# Patient Record
Sex: Female | Born: 1990 | Race: White | Hispanic: No | Marital: Married | Smoking: Never smoker
Health system: Southern US, Community
[De-identification: ages and names within clinical notes are randomized; demographics above are authoritative.]

## PROBLEM LIST (undated history)

## (undated) DIAGNOSIS — K760 Fatty (change of) liver, not elsewhere classified: Secondary | ICD-10-CM

## (undated) DIAGNOSIS — J45909 Unspecified asthma, uncomplicated: Secondary | ICD-10-CM

## (undated) DIAGNOSIS — T7840XA Allergy, unspecified, initial encounter: Secondary | ICD-10-CM

## (undated) DIAGNOSIS — T783XXA Angioneurotic edema, initial encounter: Secondary | ICD-10-CM

## (undated) DIAGNOSIS — J069 Acute upper respiratory infection, unspecified: Secondary | ICD-10-CM

## (undated) DIAGNOSIS — K219 Gastro-esophageal reflux disease without esophagitis: Secondary | ICD-10-CM

## (undated) DIAGNOSIS — J309 Allergic rhinitis, unspecified: Secondary | ICD-10-CM

## (undated) DIAGNOSIS — N946 Dysmenorrhea, unspecified: Secondary | ICD-10-CM

## (undated) DIAGNOSIS — I10 Essential (primary) hypertension: Secondary | ICD-10-CM

## (undated) DIAGNOSIS — G4730 Sleep apnea, unspecified: Secondary | ICD-10-CM

## (undated) DIAGNOSIS — R000 Tachycardia, unspecified: Secondary | ICD-10-CM

## (undated) HISTORY — DX: Sleep apnea, unspecified: G47.30

## (undated) HISTORY — DX: Fatty (change of) liver, not elsewhere classified: K76.0

## (undated) HISTORY — DX: Angioneurotic edema, initial encounter: T78.3XXA

## (undated) HISTORY — DX: Acute upper respiratory infection, unspecified: J06.9

## (undated) HISTORY — DX: Unspecified asthma, uncomplicated: J45.909

## (undated) HISTORY — PX: NO PAST SURGERIES: SHX2092

## (undated) HISTORY — DX: Dysmenorrhea, unspecified: N94.6

## (undated) HISTORY — DX: Allergic rhinitis, unspecified: J30.9

## (undated) HISTORY — DX: Allergy, unspecified, initial encounter: T78.40XA

---

## 2014-11-02 DIAGNOSIS — E559 Vitamin D deficiency, unspecified: Secondary | ICD-10-CM | POA: Insufficient documentation

## 2014-11-02 DIAGNOSIS — D509 Iron deficiency anemia, unspecified: Secondary | ICD-10-CM | POA: Insufficient documentation

## 2014-11-02 DIAGNOSIS — J309 Allergic rhinitis, unspecified: Secondary | ICD-10-CM | POA: Insufficient documentation

## 2015-05-09 ENCOUNTER — Ambulatory Visit: Payer: Self-pay | Admitting: Allergy and Immunology

## 2015-05-09 ENCOUNTER — Ambulatory Visit (INDEPENDENT_AMBULATORY_CARE_PROVIDER_SITE_OTHER): Payer: Commercial Managed Care - PPO | Admitting: Allergy and Immunology

## 2015-05-09 ENCOUNTER — Encounter: Payer: Self-pay | Admitting: *Deleted

## 2015-05-09 ENCOUNTER — Encounter: Payer: Self-pay | Admitting: Allergy and Immunology

## 2015-05-09 VITALS — BP 144/90 | HR 100 | Temp 98.1°F | Resp 16 | Ht 68.4 in | Wt 244.5 lb

## 2015-05-09 DIAGNOSIS — J45901 Unspecified asthma with (acute) exacerbation: Secondary | ICD-10-CM | POA: Diagnosis not present

## 2015-05-09 DIAGNOSIS — J3089 Other allergic rhinitis: Secondary | ICD-10-CM | POA: Diagnosis not present

## 2015-05-09 DIAGNOSIS — I10 Essential (primary) hypertension: Secondary | ICD-10-CM

## 2015-05-09 DIAGNOSIS — J0100 Acute maxillary sinusitis, unspecified: Secondary | ICD-10-CM | POA: Diagnosis not present

## 2015-05-09 DIAGNOSIS — J4541 Moderate persistent asthma with (acute) exacerbation: Secondary | ICD-10-CM | POA: Insufficient documentation

## 2015-05-09 MED ORDER — PREDNISONE 1 MG PO TABS: 10.0000 mg | ORAL_TABLET | ORAL | Status: DC

## 2015-05-09 MED ORDER — AZELASTINE HCL 0.15 % NA SOLN: 2.0000 | Freq: Two times a day (BID) | NASAL | Status: DC

## 2015-05-09 MED ORDER — LEVALBUTEROL HCL 1.25 MG/3ML IN NEBU
1.2500 mg | INHALATION_SOLUTION | Freq: Once | RESPIRATORY_TRACT | Status: AC
  Administered 2015-05-09: 1.25 mg via RESPIRATORY_TRACT

## 2015-05-09 MED ORDER — IPRATROPIUM BROMIDE 0.02 % IN SOLN
0.5000 mg | Freq: Once | RESPIRATORY_TRACT | Status: AC
  Administered 2015-05-09: 0.5 mg via RESPIRATORY_TRACT

## 2015-05-09 NOTE — Assessment & Plan Note (Signed)
Continue current treatment plan. 

## 2015-05-09 NOTE — Assessment & Plan Note (Signed)
   Rishika has been asked to follow up with her primary care physician regarding this issue.  Patient has verbalized understanding and has agreed to do so.

## 2015-05-09 NOTE — Assessment & Plan Note (Addendum)
   Prednisone has been provided, 40 mg x3 days, 20 mg x1 day, 10 mg x1 day, then stop.  For now, and during all upper respiratory tract infections and asthma flares, increase Asmanex 220 g to 2 inhalations twice a day.  She may return to her previous dose of one inhalation daily after asthma exacerbation has resolved.  To maximize pulmonary deposition, a spacer has been provided along with instructions for its proper administration with an HFA inhaler.  Continue montelukast 10 mg daily at bedtime and Xopenex HFA every 4-6 hours as needed.   The patient has been asked to contact me if her symptoms persist, progress, or if she becomes febrile. Otherwise, she may return for follow up in 4 months.

## 2015-05-09 NOTE — Progress Notes (Signed)
History of present illness: HPI Comments: Nicole Mendoza is a 24 y.o. female with persistent asthma, allergic rhinitis, gastroesophageal reflux, and hypertension who presents today for sick visit.  She reports that 4 days ago she began to experience progressive sinus pressure, nasal congestion, thick postnasal drainage, sore throat, dyspnea on exertion, chest tightness, coughing, and wheezing.  She denies fevers or chills.  She states that her blood pressure has been well-controlled on metoprolol, however she did not take this medication until just prior to the office visit today.   Assessment and plan: Asthma with acute exacerbation  Prednisone has been provided, 40 mg x3 days, 20 mg x1 day, 10 mg x1 day, then stop.  For now, and during all upper respiratory tract infections and asthma flares, increase Asmanex 220 g to 2 inhalations twice a day.  She may return to her previous dose of one inhalation daily after asthma exacerbation has resolved.  To maximize pulmonary deposition, a spacer has been provided along with instructions for its proper administration with an HFA inhaler.  Continue montelukast 10 mg daily at bedtime and Xopenex HFA every 4-6 hours as needed.   The patient has been asked to contact me if her symptoms persist, progress, or if she becomes febrile. Otherwise, she may return for follow up in 4 months.  Acute sinusitis  Prednisone has been provided (as above).  A prescription has been provided for azelastine, 2 sprays per nostril twice a day.  Continue fluticasone nasal spray 2 sprays per nostril daily.  Guaifenesin 1200 mg twice daily as needed with adequate hydration as discussed.   Nasal saline lavage as needed has been recommended along with instructions for proper administration.   Hypertension  [name] has been asked to follow up with her primary care physician regarding this issue.  Patient has verbalized understanding and has agreed to do so.  Allergic  rhinitis  Continue current treatment plan.    Medications ordered this encounter: Meds ordered this encounter  Medications  . predniSONE (DELTASONE) tablet 10 mg    Sig:   . Azelastine HCl 0.15 % SOLN    Sig: Place 2 sprays into both nostrils 2 (two) times daily.    Dispense:  30 mL    Refill:  5    Diagnositics: Spirometry:  FVC of 4.13 L and FEV1 of 3.28 L with 240 mL postbronchodilator improvement.    Physical examination: Blood pressure 144/90, pulse 100, temperature 98.1 F (36.7 C), temperature source Oral, resp. rate 16, height 5' 8.4" (1.737 m), weight 244 lb 7.8 oz (110.9 kg).  General: Alert, interactive, in no acute distress. HEENT: TMs pearly gray, turbinates markedly edematous with thick discharge, post-pharynx erythematous. Neck: Supple without lymphadenopathy. Lungs: Mildly decreased breath sounds bilaterally without wheezing, rhonchi or rales. CV: Normal S1, S2 without murmurs. Skin: Warm and dry, without lesions or rashes.  The following portions of the patient's history were reviewed and updated as appropriate: allergies, current medications, past family history, past medical history, past social history, past surgical history and problem list.  Outpatient medications:   Medication List       This list is accurate as of: 05/09/15  2:50 PM.  Always use your most recent med list.               Azelastine HCl 0.15 % Soln  Place 2 sprays into both nostrils 2 (two) times daily.     fluticasone 50 MCG/ACT nasal spray  Commonly known as:  FLONASE  Place 2  sprays into both nostrils daily.     levalbuterol 45 MCG/ACT inhaler  Commonly known as:  XOPENEX HFA  Inhale 2 puffs into the lungs every 4 (four) hours as needed for wheezing.     LUTERA 0.1-20 MG-MCG tablet  Generic drug:  levonorgestrel-ethinyl estradiol  Take by mouth daily.     metoprolol succinate 50 MG 24 hr tablet  Commonly known as:  TOPROL-XL  Take 50 mg by mouth daily.      montelukast 10 MG tablet  Commonly known as:  SINGULAIR  Take 10 mg by mouth daily.     pantoprazole 40 MG tablet  Commonly known as:  PROTONIX  Take 40 mg by mouth daily.        Known medication allergies: Allergies  Allergen Reactions  . Penicillins Hives  . Albuterol Palpitations  . Losartan Rash   Review of systems: Constitutional: Negative for fever, chills and weight loss.  HENT: Negative for nosebleeds.   Positive for sinus pressure and sore throat. Eyes: Negative for blurred vision.  Respiratory: Negative for hemoptysis.   Positive for wheezing, dyspnea, and coughing. Cardiovascular: Negative for chest pain.  Gastrointestinal: Negative for diarrhea and constipation.  Genitourinary: Negative for dysuria.  Musculoskeletal: Negative for myalgias and joint pain.  Neurological: Negative for dizziness.  Endo/Heme/Allergies: Does not bruise/bleed easily.   Past Medical History  Diagnosis Date  . Asthma     Family History  Problem Relation Age of Onset  . Allergic rhinitis Neg Hx   . Angioedema Neg Hx   . Asthma Neg Hx   . Atopy Neg Hx   . Eczema Neg Hx   . Immunodeficiency Neg Hx   . Urticaria Neg Hx     Social History   Social History  . Marital Status: Single    Spouse Name: N/A  . Number of Children: N/A  . Years of Education: N/A   Occupational History  . Not on file.   Social History Main Topics  . Smoking status: Never Smoker   . Smokeless tobacco: Not on file  . Alcohol Use: No  . Drug Use: No  . Sexual Activity: Not on file   Other Topics Concern  . Not on file   Social History Narrative  . No narrative on file    I appreciate the opportunity to take part in this Nicole Mendoza's care. Please do not hesitate to contact me with questions.  Sincerely,   R. Edgar Frisk, MD

## 2015-05-09 NOTE — Patient Instructions (Addendum)
Asthma with acute exacerbation  Prednisone has been provided, 40 mg x3 days, 20 mg x1 day, 10 mg x1 day, then stop.  For now, and during all upper respiratory tract infections and asthma flares, increase Asmanex 220 g to 2 inhalations twice a day.  She may return to her previous dose of one inhalation daily after asthma exacerbation has resolved.  To maximize pulmonary deposition, a spacer has been provided along with instructions for its proper administration with an HFA inhaler.  Continue montelukast 10 mg daily at bedtime and Xopenex HFA every 4-6 hours as needed.   The patient has been asked to contact me if her symptoms persist, progress, or if she becomes febrile. Otherwise, she may return for follow up in 4 months.  Acute sinusitis  Prednisone has been provided (as above).  A prescription has been provided for azelastine, 2 sprays per nostril twice a day.  Continue fluticasone nasal spray 2 sprays per nostril daily.  Guaifenesin 1200 mg twice daily as needed with adequate hydration as discussed.   Nasal saline lavage as needed has been recommended along with instructions for proper administration.   Hypertension  Shamyia has been asked to follow up with her primary care physician regarding this issue.  Patient has verbalized understanding and has agreed to do so.  Allergic rhinitis  Continue current treatment plan.    Return in about 4 months (around 09/06/2015), or if symptoms worsen or fail to improve.

## 2015-05-09 NOTE — Assessment & Plan Note (Signed)
   Prednisone has been provided (as above).  A prescription has been provided for azelastine, 2 sprays per nostril twice a day.  Continue fluticasone nasal spray 2 sprays per nostril daily.  Guaifenesin 1200 mg twice daily as needed with adequate hydration as discussed.   Nasal saline lavage as needed has been recommended along with instructions for proper administration. 

## 2015-05-15 ENCOUNTER — Other Ambulatory Visit: Payer: Self-pay | Admitting: Internal Medicine

## 2015-05-16 ENCOUNTER — Telehealth: Payer: Self-pay | Admitting: Allergy and Immunology

## 2015-05-16 MED ORDER — PREDNISONE 10 MG PO TABS: 10.0000 mg | ORAL_TABLET | Freq: Two times a day (BID) | ORAL | Status: DC

## 2015-05-16 MED ORDER — AZITHROMYCIN 250 MG PO TABS: ORAL_TABLET | ORAL | Status: DC

## 2015-05-16 NOTE — Telephone Encounter (Signed)
PATIENT INFORMED OF SCRIPT AND TO USE SALINE AND MUCINEX. ENT SCRIPTS TO [facility] FOR PATIENT.

## 2015-05-16 NOTE — Telephone Encounter (Signed)
Left message for patient to return call to office. Patient just returned my call and patient states she is having greenish nasal discharge, left ear pain along with facial pain on left side.  Patient also stating having a headache, forehead area and teeth pain on left side of upper teeth. Patient completed her prednisone pack on Saturday. Patient stated it all started Saturday into Sunday. Patient wants any prescriptions to be called out to [facility] [place].

## 2015-05-16 NOTE — Telephone Encounter (Signed)
Please call in a prescription for additional prednisone, 20 mg x 4 days, 10 mg x1 day, then stop. In addition, please call in a prescription for azithromycin, 500 mg on day 1 and 250 mg on days 2 through 5.  Please ask Tanyjah to continue nasal saline lavage and Mucinex 1200 mg with adequate hydration.

## 2015-05-16 NOTE — Telephone Encounter (Signed)
Patient was seen last Tuesday. Dr.Bobitt told her to call in if she doesn't feel any better for an antibiotic.

## 2015-06-01 ENCOUNTER — Other Ambulatory Visit: Payer: Self-pay | Admitting: Allergy

## 2015-06-01 MED ORDER — LEVALBUTEROL TARTRATE 45 MCG/ACT IN AERO: 2.0000 | INHALATION_SPRAY | Freq: Four times a day (QID) | RESPIRATORY_TRACT | Status: DC | PRN

## 2015-06-02 ENCOUNTER — Other Ambulatory Visit: Payer: Self-pay

## 2015-06-02 MED ORDER — LEVALBUTEROL TARTRATE 45 MCG/ACT IN AERO: 2.0000 | INHALATION_SPRAY | Freq: Four times a day (QID) | RESPIRATORY_TRACT | Status: DC | PRN

## 2015-07-17 ENCOUNTER — Other Ambulatory Visit: Payer: Self-pay | Admitting: Allergy

## 2015-07-17 MED ORDER — MONTELUKAST SODIUM 10 MG PO TABS: 10.0000 mg | ORAL_TABLET | Freq: Every day | ORAL | Status: DC

## 2015-08-14 ENCOUNTER — Other Ambulatory Visit: Payer: Self-pay

## 2015-08-14 MED ORDER — FLUTICASONE PROPIONATE 50 MCG/ACT NA SUSP: 2.0000 | Freq: Every day | NASAL | Status: DC

## 2015-08-14 NOTE — Telephone Encounter (Signed)
rx refill Fluticasone 50 with 3 refill

## 2015-08-21 ENCOUNTER — Telehealth: Payer: Self-pay | Admitting: Allergy

## 2015-08-21 ENCOUNTER — Other Ambulatory Visit: Payer: Self-pay | Admitting: Allergy

## 2015-08-21 MED ORDER — BECLOMETHASONE DIPROPIONATE 80 MCG/ACT IN AERS
2.0000 | INHALATION_SPRAY | Freq: Two times a day (BID) | RESPIRATORY_TRACT | Status: DC
Start: 2015-08-21 — End: 2016-09-11

## 2015-08-21 NOTE — Telephone Encounter (Signed)
Use Qvar 80-2puffs twice a day to prevent coughing or wheezing

## 2015-08-21 NOTE — Telephone Encounter (Signed)
PATIENT CALLED SAID HER INSURANCE WOULD NOT PAY FOR ASMANEX. SHE SAID SHE HAD TRIED SYMBICORT MADE HER SICK. DULRA  SHE DIDN'T DO GOOD ON.SHE USED Q-VAR FOR A WHILE AND DID OK ON IT.

## 2015-08-21 NOTE — Telephone Encounter (Signed)
CALLED IN Q-VAR 80 AND INFORMED PATIENT.

## 2015-09-05 ENCOUNTER — Other Ambulatory Visit: Payer: Self-pay

## 2015-09-05 MED ORDER — MONTELUKAST SODIUM 10 MG PO TABS: 10.0000 mg | ORAL_TABLET | Freq: Every day | ORAL | Status: DC

## 2015-09-06 ENCOUNTER — Ambulatory Visit: Payer: Commercial Managed Care - PPO | Admitting: Internal Medicine

## 2015-10-09 ENCOUNTER — Telehealth: Payer: Self-pay | Admitting: Allergy

## 2015-10-09 NOTE — Telephone Encounter (Signed)
Patient called said she was almost out of q-var.Gave patient sample. She said insurance would not pay for it or the asmanex. Patient said she could not take symbicort or dulera.Said she was on breo for a while. [employer] would pay for that. Please advise.

## 2015-10-11 ENCOUNTER — Other Ambulatory Visit: Payer: Self-pay | Admitting: Allergy

## 2015-10-11 ENCOUNTER — Telehealth: Payer: Self-pay | Admitting: Allergy

## 2015-10-11 MED ORDER — FLUTICASONE PROPIONATE HFA 110 MCG/ACT IN AERO: 2.0000 | INHALATION_SPRAY | Freq: Two times a day (BID) | RESPIRATORY_TRACT | Status: DC

## 2015-10-11 NOTE — Telephone Encounter (Signed)
INFORMED PATIENT WE CHANGED TO FLOVENT PER DR BHATTI.

## 2015-10-11 NOTE — Telephone Encounter (Signed)
INFORMED PATIENT  DR.BHATTI WAS SWITCHING HER TO FLOVENT 110MCG 2 PUFFS TWICE A DAY.FAXED IN RX.

## 2015-10-11 NOTE — Telephone Encounter (Signed)
Left message for patient to call office reg. meds.

## 2015-10-11 NOTE — Telephone Encounter (Signed)
Stop QVAR and switch to Flovent 110 mcg 2 puffs bid

## 2015-12-22 ENCOUNTER — Encounter: Payer: Self-pay | Admitting: Pediatrics

## 2015-12-22 ENCOUNTER — Ambulatory Visit (INDEPENDENT_AMBULATORY_CARE_PROVIDER_SITE_OTHER): Payer: BLUE CROSS/BLUE SHIELD | Admitting: Pediatrics

## 2015-12-22 VITALS — BP 124/86 | HR 92 | Temp 98.0°F | Resp 20

## 2015-12-22 DIAGNOSIS — J301 Allergic rhinitis due to pollen: Secondary | ICD-10-CM

## 2015-12-22 DIAGNOSIS — J4541 Moderate persistent asthma with (acute) exacerbation: Secondary | ICD-10-CM

## 2015-12-22 DIAGNOSIS — K219 Gastro-esophageal reflux disease without esophagitis: Secondary | ICD-10-CM

## 2015-12-22 DIAGNOSIS — Z79899 Other long term (current) drug therapy: Secondary | ICD-10-CM | POA: Diagnosis not present

## 2015-12-22 MED ORDER — LEVALBUTEROL HCL 1.25 MG/3ML IN NEBU: 1.2500 mg | INHALATION_SOLUTION | Freq: Four times a day (QID) | RESPIRATORY_TRACT | Status: DC | PRN

## 2015-12-22 NOTE — Progress Notes (Signed)
  [place] [address] Dept: [phone]  FOLLOW UP NOTE  Patient ID: Nicole Mendoza, female    DOB: [birth year]  Age: 25 y.o. MRN: [id number] Date of Office Visit: 12/22/2015  Assessment Chief Complaint: Cough; Wheezing; and Nasal Congestion  HPI Nicole Mendoza presents for for treatment of breathing difficulties. She has asthma. She has been having more breathing difficulties during the past 3 days. She could not afford inhaled steroids due to the cost. I will try to give her a sample. She is allergic to grass pollens, tree pollens, dust mites  cockroach and some molds.  Current medications cetirizine 10 mg once a day, fluticasone 2 sprays per nostril once a day, Pro-air 2 puffs every 4 hours if needed, montelukast 10 mg once a day and Xopenex 1.25 mg every 6 hours if needed in  her nebulizer. She gets very nervous from albuterol in a nebulizer. Her other medications are outlined in the chart.   Drug Allergies:  Allergies  Allergen Reactions  . Penicillins Hives  . Albuterol Palpitations  . Losartan Rash    Physical Exam: BP 124/86 mmHg  Pulse 92  Temp(Src) 98 F (36.7 C) (Oral)  Resp 20   Physical Exam  Constitutional: She is oriented to person, place, and time. She appears well-developed and well-nourished.  HENT:  Eyes normal. Ears normal. Nose mild swelling of nasal turbinates. Pharynx normal.  Neck: Neck supple.  Cardiovascular:  S1 and S2 normal no murmurs  Pulmonary/Chest:  Clear to percussion auscultation except for mild wheezing in both lungs  Lymphadenopathy:    She has no cervical adenopathy.  Neurological: She is alert and oriented to person, place, and time.  Psychiatric: She has a normal mood and affect. Her behavior is normal. Judgment and thought content normal.  Vitals reviewed.   Diagnostics:  FVC 4.17 L FEV1 3.30 L. Predicted FVC 4.31 L predicted FEV1 3.68 L. After albuterol by nebulization FVC 4.24 L FEV1 3.51 m-the spirometry  is in the normal range and there was some improvement after albuterol. Her lungs were clear after albuterol  Assessment and Plan: 1. Asthma with acute exacerbation, moderate persistent   2. Gastroesophageal reflux disease without esophagitis   3. Current use of beta blocker   4. Allergic rhinitis due to pollen     Meds ordered this encounter  Medications  . levalbuterol (XOPENEX) 1.25 MG/3ML nebulizer solution    Sig: Take 1.25 mg by nebulization every 6 (six) hours as needed for wheezing.    Dispense:  90 mL    Refill:  1    Patient Instructions  Continue on her current medications Asmanex 220 HFA-2 puffs once a day to prevent coughing or wheezing Add prednisone 10 mg twice a day for 4 days, 10 mg on the fifth day Call me if you are not doing well on this treatment plan    Return in about 3 months (around 03/23/2016).    Thank you for the opportunity to care for this patient.  Please do not hesitate to contact me with questions.  Penne Lash, M.D.  [facility] [address] [place], [place] [address] [phone]

## 2015-12-22 NOTE — Patient Instructions (Signed)
Continue on her current medications Asmanex 220 HFA-2 puffs once a day to prevent coughing or wheezing Add prednisone 10 mg twice a day for 4 days, 10 mg on the fifth day Call me if you are not doing well on this treatment plan

## 2016-01-29 ENCOUNTER — Other Ambulatory Visit: Payer: Self-pay | Admitting: Allergy

## 2016-01-29 MED ORDER — FLUTICASONE PROPIONATE 50 MCG/ACT NA SUSP: 2.0000 | Freq: Every day | NASAL | 0 refills | Status: DC

## 2016-02-05 ENCOUNTER — Other Ambulatory Visit: Payer: Self-pay | Admitting: *Deleted

## 2016-02-05 MED ORDER — LEVALBUTEROL HCL 1.25 MG/3ML IN NEBU: 1.2500 mg | INHALATION_SOLUTION | Freq: Four times a day (QID) | RESPIRATORY_TRACT | 1 refills | Status: DC | PRN

## 2016-02-06 ENCOUNTER — Other Ambulatory Visit: Payer: Self-pay | Admitting: *Deleted

## 2016-02-06 MED ORDER — LEVALBUTEROL HCL 1.25 MG/3ML IN NEBU: 1.2500 mg | INHALATION_SOLUTION | Freq: Four times a day (QID) | RESPIRATORY_TRACT | 1 refills | Status: DC | PRN

## 2016-04-22 ENCOUNTER — Telehealth: Payer: Self-pay

## 2016-04-22 MED ORDER — LEVALBUTEROL TARTRATE 45 MCG/ACT IN AERO: 2.0000 | INHALATION_SPRAY | Freq: Four times a day (QID) | RESPIRATORY_TRACT | 1 refills | Status: DC | PRN

## 2016-04-22 NOTE — Telephone Encounter (Signed)
Pt.needing a refill on her xopenex hfa

## 2016-06-26 DIAGNOSIS — N920 Excessive and frequent menstruation with regular cycle: Secondary | ICD-10-CM | POA: Insufficient documentation

## 2016-06-26 DIAGNOSIS — N946 Dysmenorrhea, unspecified: Secondary | ICD-10-CM | POA: Insufficient documentation

## 2016-07-02 ENCOUNTER — Other Ambulatory Visit: Payer: Self-pay | Admitting: Allergy

## 2016-07-02 MED ORDER — FLUTICASONE PROPIONATE 50 MCG/ACT NA SUSP: 2.0000 | Freq: Every day | NASAL | 2 refills | Status: DC

## 2016-08-08 ENCOUNTER — Other Ambulatory Visit: Payer: Self-pay

## 2016-08-08 MED ORDER — MONTELUKAST SODIUM 10 MG PO TABS: 10.0000 mg | ORAL_TABLET | Freq: Every day | ORAL | 5 refills | Status: DC

## 2016-08-08 NOTE — Telephone Encounter (Signed)
RF for montelukast x 5 at [facility]

## 2016-08-30 ENCOUNTER — Telehealth: Payer: Self-pay | Admitting: *Deleted

## 2016-08-30 NOTE — Telephone Encounter (Signed)
Pt has had a nebulizer for 10+ years. She recently moved and lost it in the process. Pt is wondering if we can get her a new one and if her insurance will cover it since its been so long.

## 2016-08-30 NOTE — Telephone Encounter (Signed)
Spoke with patient and she needs OV first and we will give neb at visit. She is on the schedule.

## 2016-09-11 ENCOUNTER — Ambulatory Visit (INDEPENDENT_AMBULATORY_CARE_PROVIDER_SITE_OTHER): Payer: Managed Care, Other (non HMO) | Admitting: Allergy and Immunology

## 2016-09-11 ENCOUNTER — Encounter: Payer: Self-pay | Admitting: Allergy and Immunology

## 2016-09-11 VITALS — BP 150/86 | HR 88 | Temp 98.4°F | Resp 20

## 2016-09-11 DIAGNOSIS — J45901 Unspecified asthma with (acute) exacerbation: Secondary | ICD-10-CM

## 2016-09-11 DIAGNOSIS — J0110 Acute frontal sinusitis, unspecified: Secondary | ICD-10-CM | POA: Diagnosis not present

## 2016-09-11 DIAGNOSIS — I10 Essential (primary) hypertension: Secondary | ICD-10-CM | POA: Diagnosis not present

## 2016-09-11 DIAGNOSIS — J3089 Other allergic rhinitis: Secondary | ICD-10-CM | POA: Diagnosis not present

## 2016-09-11 MED ORDER — PREDNISONE 1 MG PO TABS: 10.0000 mg | ORAL_TABLET | Freq: Every day | ORAL | Status: DC

## 2016-09-11 MED ORDER — FLUTICASONE PROPIONATE HFA 110 MCG/ACT IN AERO: 2.0000 | INHALATION_SPRAY | Freq: Two times a day (BID) | RESPIRATORY_TRACT | 5 refills | Status: DC

## 2016-09-11 MED ORDER — MONTELUKAST SODIUM 10 MG PO TABS: 10.0000 mg | ORAL_TABLET | Freq: Every day | ORAL | 5 refills | Status: DC

## 2016-09-11 MED ORDER — AZELASTINE HCL 0.1 % NA SOLN: NASAL | 5 refills | Status: DC

## 2016-09-11 NOTE — Assessment & Plan Note (Signed)
   Prednisone has been provided (as above).  A prescription has been provided for azelastine, 2 sprays per nostril twice a day.  Continue fluticasone nasal spray 2 sprays per nostril daily.  Guaifenesin 1200 mg twice daily as needed with adequate hydration as discussed.   Nasal saline lavage as needed has been recommended along with instructions for proper administration.

## 2016-09-11 NOTE — Patient Instructions (Addendum)
Asthma with acute exacerbation  Prednisone has been provided, 40 mg x3 days, 20 mg x1 day, 10 mg x1 day, then stop.  A prescription has been provided for Flovent 110 g, 2 inhalations twice a day.    To maximize pulmonary deposition, a spacer has been provided along with instructions for its proper administration with an HFA inhaler.  Restart montelukast 10 mg daily at bedtime.  Continue Xopenex HFA every 4-6 hours as needed.   At the patient's request, a nebulizer has been provided along with a prescription for Xopenex solution.  The patient has been asked to contact me if her symptoms persist, progress, or if she becomes febrile. Otherwise, she may return for follow up in 4 months.  Acute sinusitis  Prednisone has been provided (as above).  A prescription has been provided for azelastine, 2 sprays per nostril twice a day.  Continue fluticasone nasal spray 2 sprays per nostril daily.  Guaifenesin 1200 mg twice daily as needed with adequate hydration as discussed.   Nasal saline lavage as needed has been recommended along with instructions for proper administration.  Hypertension  Continue antihypertensives as prescribed and follow up with primary care physician for monitoring.  Avoid decongestants such as pseudoephedrine and phenylephrine as these medications may raise blood pressure.   Return in about 4 months (around 01/11/2017), or if symptoms worsen or fail to improve.

## 2016-09-11 NOTE — Assessment & Plan Note (Signed)
   Continue antihypertensives as prescribed and follow up with primary care physician for monitoring.  Avoid decongestants such as pseudoephedrine and phenylephrine as these medications may raise blood pressure.

## 2016-09-11 NOTE — Progress Notes (Signed)
Follow-up Note  RE: Nicole Mendoza MRN: [id number] DOB: 09-05-1990 Date of Office Visit: 09/11/2016  Primary care provider: Reeves Dam, MD Referring provider: Reeves Dam, MD  History of present illness: Nicole Mendoza is a 26 y.o. female with persistent asthma, allergic rhinoconjunctivitis, gastroesophageal reflux disease, and hypertension presented today for sick visit.  She was last seen in this clinic in July 2017.  Over the past few days she has experienced frontal sinus pressure, greenish mucus production, postnasal drainage, nasal congestion.  She denies fevers or chills.  She also complains of chest tightness and dyspnea over the past couple days.  She lost her nebulizer machine and requests a new one.  In addition, she ran out of Asmanex in December and never got this medication refilled, however she is still taking montelukast daily.   Assessment and plan: Asthma with acute exacerbation  Prednisone has been provided, 40 mg x3 days, 20 mg x1 day, 10 mg x1 day, then stop.  A prescription has been provided for Flovent 110 g, 2 inhalations twice a day.    To maximize pulmonary deposition, a spacer has been provided along with instructions for its proper administration with an HFA inhaler.  Restart montelukast 10 mg daily at bedtime.  Continue Xopenex HFA every 4-6 hours as needed.   At the patient's request, a nebulizer has been provided along with a prescription for Xopenex solution.  The patient has been asked to contact me if her symptoms persist, progress, or if she becomes febrile. Otherwise, she may return for follow up in 4 months.  Acute sinusitis  Prednisone has been provided (as above).  A prescription has been provided for azelastine, 2 sprays per nostril twice a day.  Continue fluticasone nasal spray 2 sprays per nostril daily.  Guaifenesin 1200 mg twice daily as needed with adequate hydration as discussed.   Nasal saline lavage as needed has been  recommended along with instructions for proper administration.  Hypertension  Continue antihypertensives as prescribed and follow up with primary care physician for monitoring.  Avoid decongestants such as pseudoephedrine and phenylephrine as these medications may raise blood pressure.   Meds ordered this encounter  Medications  . fluticasone (FLOVENT HFA) 110 MCG/ACT inhaler    Sig: Inhale 2 puffs into the lungs 2 (two) times daily.    Dispense:  1 Inhaler    Refill:  5    To  Prevent cough or wheeze. Use with spacer.  . montelukast (SINGULAIR) 10 MG tablet    Sig: Take 1 tablet (10 mg total) by mouth daily.    Dispense:  30 tablet    Refill:  5    For cough or wheeze.  Marland Kitchen azelastine (ASTELIN) 0.1 % nasal spray    Sig: 2 sprays per nostril twice daily    Dispense:  30 mL    Refill:  5    For runny nose.  . predniSONE (DELTASONE) tablet 10 mg    Diagnostics: Spirometry reveals an FVC of 3.98 L and an FEV1 of 3.15 L of (86% predicted) without significant post bronc dilator improvement.  Please see scanned spirometry results for details.    Physical examination: Blood pressure (!) 150/86, pulse 88, temperature 98.4 F (36.9 C), temperature source Oral, resp. rate 20, SpO2 98 %.  General: Alert, interactive, in no acute distress. HEENT: TMs pearly gray, turbinates edematous with thick discharge, post-pharynx erythematous. Neck: Supple without lymphadenopathy. Lungs: Mildly decreased breath sounds bilaterally without wheezing, rhonchi or rales.  CV: Normal S1, S2 without murmurs. Skin: Warm and dry, without lesions or rashes.  The following portions of the patient's history were reviewed and updated as appropriate: allergies, current medications, past family history, past medical history, past social history, past surgical history and problem list.  Allergies as of 09/11/2016      Reactions   Penicillins Hives   Albuterol Palpitations   Losartan Rash      Medication List        Accurate as of 09/11/16 12:49 PM. Always use your most recent med list.          azelastine 0.1 % nasal spray Commonly known as:  ASTELIN 2 sprays per nostril twice daily   cetirizine 10 MG tablet Commonly known as:  ZYRTEC Take by mouth.   fluticasone 110 MCG/ACT inhaler Commonly known as:  FLOVENT HFA Inhale 2 puffs into the lungs 2 (two) times daily.   fluticasone 110 MCG/ACT inhaler Commonly known as:  FLOVENT HFA Inhale 2 puffs into the lungs 2 (two) times daily.   fluticasone 50 MCG/ACT nasal spray Commonly known as:  FLONASE Place 2 sprays into both nostrils daily.   hydrochlorothiazide 25 MG tablet Commonly known as:  HYDRODIURIL Take 25 mg by mouth.   levalbuterol 1.25 MG/3ML nebulizer solution Commonly known as:  XOPENEX Take 1.25 mg by nebulization every 6 (six) hours as needed for wheezing.   levalbuterol 45 MCG/ACT inhaler Commonly known as:  XOPENEX HFA Inhale 2 puffs into the lungs every 6 (six) hours as needed for wheezing.   metoprolol succinate 100 MG 24 hr tablet Commonly known as:  TOPROL-XL Take 100 mg by mouth daily. Take with or immediately following a meal.   montelukast 10 MG tablet Commonly known as:  SINGULAIR Take 1 tablet (10 mg total) by mouth daily.   ondansetron 4 MG tablet Commonly known as:  ZOFRAN Take 4 mg by mouth.   ORTHO MICRONOR 0.35 MG tablet Generic drug:  norethindrone Take 0.35 mg by mouth.   pantoprazole 40 MG tablet Commonly known as:  PROTONIX Take 40 mg by mouth daily.   predniSONE 10 MG tablet Commonly known as:  DELTASONE Take 1 tablet (10 mg total) by mouth 2 (two) times daily. TAKE ONE TABLET TWICE DAILY FOR 4 DAYS THEN ONE TABLET ONE THE 5TH DAY.       Allergies  Allergen Reactions  . Penicillins Hives  . Albuterol Palpitations  . Losartan Rash   Review of systems: Review of systems negative except as noted in HPI / PMHx or noted below: Constitutional: Negative.  HENT: Negative.     Eyes: Negative.  Respiratory: Negative.   Cardiovascular: Negative.  Gastrointestinal: Negative.  Genitourinary: Negative.  Musculoskeletal: Negative.  Neurological: Negative.  Endo/Heme/Allergies: Negative.  Cutaneous: Negative.  Past Medical History:  Diagnosis Date  . Asthma     Family History  Problem Relation Age of Onset  . Allergic rhinitis Neg Hx   . Angioedema Neg Hx   . Asthma Neg Hx   . Atopy Neg Hx   . Eczema Neg Hx   . Immunodeficiency Neg Hx   . Urticaria Neg Hx     Social History   Social History  . Marital status: Single    Spouse name: N/A  . Number of children: N/A  . Years of education: N/A   Occupational History  . Not on file.   Social History Main Topics  . Smoking status: Never Smoker  . Smokeless tobacco: Never Used  .  Alcohol use No  . Drug use: No  . Sexual activity: Not on file   Other Topics Concern  . Not on file   Social History Narrative  . No narrative on file    I appreciate the opportunity to take part in Kamika's care. Please do not hesitate to contact me with questions.  Sincerely,   R. Edgar Frisk, MD

## 2016-09-11 NOTE — Assessment & Plan Note (Signed)
   Prednisone has been provided, 40 mg x3 days, 20 mg x1 day, 10 mg x1 day, then stop.  A prescription has been provided for Flovent 110 g, 2 inhalations twice a day.    To maximize pulmonary deposition, a spacer has been provided along with instructions for its proper administration with an HFA inhaler.  Restart montelukast 10 mg daily at bedtime.  Continue Xopenex HFA every 4-6 hours as needed.   At the patient's request, a nebulizer has been provided along with a prescription for Xopenex solution.  The patient has been asked to contact me if her symptoms persist, progress, or if she becomes febrile. Otherwise, she may return for follow up in 4 months.

## 2016-10-17 ENCOUNTER — Other Ambulatory Visit: Payer: Self-pay

## 2016-10-17 MED ORDER — LEVALBUTEROL TARTRATE 45 MCG/ACT IN AERO: 2.0000 | INHALATION_SPRAY | Freq: Four times a day (QID) | RESPIRATORY_TRACT | 1 refills | Status: DC | PRN

## 2016-10-17 NOTE — Telephone Encounter (Signed)
Patient needs refill on Xopenex HFA. Last ov 09/11/16.

## 2016-12-17 ENCOUNTER — Ambulatory Visit: Payer: Managed Care, Other (non HMO) | Admitting: Pediatrics

## 2016-12-17 DIAGNOSIS — J309 Allergic rhinitis, unspecified: Secondary | ICD-10-CM

## 2017-01-16 ENCOUNTER — Ambulatory Visit: Payer: BLUE CROSS/BLUE SHIELD | Admitting: Allergy and Immunology

## 2017-01-31 ENCOUNTER — Telehealth: Payer: Self-pay | Admitting: Allergy and Immunology

## 2017-01-31 NOTE — Telephone Encounter (Signed)
Patient called and has a question about her nebulizer.
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
## 2017-03-26 ENCOUNTER — Other Ambulatory Visit: Payer: Self-pay | Admitting: Allergy and Immunology

## 2017-03-26 DIAGNOSIS — J3089 Other allergic rhinitis: Secondary | ICD-10-CM

## 2017-03-26 DIAGNOSIS — J45901 Unspecified asthma with (acute) exacerbation: Secondary | ICD-10-CM

## 2017-04-13 ENCOUNTER — Emergency Department (HOSPITAL_BASED_OUTPATIENT_CLINIC_OR_DEPARTMENT_OTHER)
Admission: EM | Admit: 2017-04-13 | Discharge: 2017-04-14 | Disposition: A | Discharge status: Home / Self Care | Payer: Managed Care, Other (non HMO) | Attending: Emergency Medicine | Admitting: Emergency Medicine

## 2017-04-13 ENCOUNTER — Encounter (HOSPITAL_BASED_OUTPATIENT_CLINIC_OR_DEPARTMENT_OTHER): Payer: Self-pay | Admitting: Emergency Medicine

## 2017-04-13 DIAGNOSIS — I10 Essential (primary) hypertension: Secondary | ICD-10-CM | POA: Diagnosis not present

## 2017-04-13 DIAGNOSIS — Z79899 Other long term (current) drug therapy: Secondary | ICD-10-CM | POA: Diagnosis not present

## 2017-04-13 DIAGNOSIS — J45909 Unspecified asthma, uncomplicated: Secondary | ICD-10-CM | POA: Diagnosis not present

## 2017-04-13 DIAGNOSIS — R1013 Epigastric pain: Secondary | ICD-10-CM | POA: Diagnosis present

## 2017-04-13 LAB — CBC
HEMATOCRIT: 41 % (ref 36.0–46.0)
Hemoglobin: 14.1 g/dL (ref 12.0–15.0)
MCH: 27.1 pg (ref 26.0–34.0)
MCHC: 34.4 g/dL (ref 30.0–36.0)
MCV: 78.7 fL (ref 78.0–100.0)
Platelets: 349 10*3/uL (ref 150–400)
RBC: 5.21 MIL/uL — AB (ref 3.87–5.11)
RDW: 13.3 % (ref 11.5–15.5)
WBC: 9.9 10*3/uL (ref 4.0–10.5)

## 2017-04-13 LAB — COMPREHENSIVE METABOLIC PANEL
ALT: 65 U/L — ABNORMAL HIGH (ref 14–54)
AST: 49 U/L — ABNORMAL HIGH (ref 15–41)
Albumin: 4.5 g/dL (ref 3.5–5.0)
Alkaline Phosphatase: 75 U/L (ref 38–126)
Anion gap: 9 (ref 5–15)
BUN: 14 mg/dL (ref 6–20)
CHLORIDE: 102 mmol/L (ref 101–111)
CO2: 26 mmol/L (ref 22–32)
Calcium: 9.8 mg/dL (ref 8.9–10.3)
Creatinine, Ser: 0.92 mg/dL (ref 0.44–1.00)
Glucose, Bld: 102 mg/dL — ABNORMAL HIGH (ref 65–99)
POTASSIUM: 2.9 mmol/L — AB (ref 3.5–5.1)
Sodium: 137 mmol/L (ref 135–145)
Total Bilirubin: 0.3 mg/dL (ref 0.3–1.2)
Total Protein: 8.5 g/dL — ABNORMAL HIGH (ref 6.5–8.1)

## 2017-04-13 LAB — URINALYSIS, ROUTINE W REFLEX MICROSCOPIC
BILIRUBIN URINE: NEGATIVE
Glucose, UA: NEGATIVE mg/dL
Ketones, ur: NEGATIVE mg/dL
Leukocytes, UA: NEGATIVE
Nitrite: NEGATIVE
PROTEIN: NEGATIVE mg/dL
Specific Gravity, Urine: 1.01 (ref 1.005–1.030)
pH: 5.5 (ref 5.0–8.0)

## 2017-04-13 LAB — LIPASE, BLOOD: LIPASE: 41 U/L (ref 11–51)

## 2017-04-13 LAB — PREGNANCY, URINE: PREG TEST UR: NEGATIVE

## 2017-04-13 LAB — URINALYSIS, MICROSCOPIC (REFLEX): BACTERIA UA: NONE SEEN

## 2017-04-13 NOTE — ED Triage Notes (Signed)
PT presents with c/o RUQ pain and right flank pain. PT ststes she has been RUG pain for a couple months and has seen her doctor. Was given cipro yesterday for UTI but sts her bones ache now from cipro and has burning in her stomach.

## 2017-04-14 ENCOUNTER — Ambulatory Visit (HOSPITAL_BASED_OUTPATIENT_CLINIC_OR_DEPARTMENT_OTHER)
Admit: 2017-04-14 | Discharge: 2017-04-14 | Disposition: A | Discharge status: Home / Self Care | Payer: Managed Care, Other (non HMO) | Source: Ambulatory Visit | Attending: Emergency Medicine | Admitting: Emergency Medicine

## 2017-04-14 ENCOUNTER — Other Ambulatory Visit (HOSPITAL_BASED_OUTPATIENT_CLINIC_OR_DEPARTMENT_OTHER): Payer: Self-pay | Admitting: Emergency Medicine

## 2017-04-14 DIAGNOSIS — R1011 Right upper quadrant pain: Secondary | ICD-10-CM

## 2017-04-14 DIAGNOSIS — K769 Liver disease, unspecified: Secondary | ICD-10-CM

## 2017-04-14 MED ORDER — POTASSIUM CHLORIDE ER 10 MEQ PO TBCR: 20.0000 meq | EXTENDED_RELEASE_TABLET | Freq: Two times a day (BID) | ORAL | 0 refills | Status: DC

## 2017-04-14 MED ORDER — HYDROCODONE-ACETAMINOPHEN 5-325 MG PO TABS: 1.0000 | ORAL_TABLET | Freq: Four times a day (QID) | ORAL | 0 refills | Status: DC | PRN

## 2017-04-14 NOTE — ED Notes (Signed)
Pt had US done  Results Shown to dr Rogene Houston    Stated US was normal and tell pt to follow up with her pcp  Pt agrees w plans  s Maevyn Riordan rn    04/14/17  1925

## 2017-04-14 NOTE — ED Provider Notes (Signed)
[facility] EMERGENCY DEPARTMENT Provider Note   CSN: [id number] Arrival date & time: 04/13/17  2116     History   Chief Complaint Chief Complaint  Patient presents with  . Abdominal Pain    HPI Nicole Mendoza is a 26 y.o. female.  Patient with history of hypertension, asthma, and obesity.  She presents today for evaluation of abdominal pain.  This is been ongoing for several weeks.  She reports pain in her epigastrium and right upper quadrant that occurs when she eats, primarily with spicy or fatty foods.  She has been seen by her primary doctor and was started on Cipro yesterday and will have an outpatient ultrasound sometime in the future.  Her pain became worse this evening and she presents for evaluation of it.  She also reports pain in her bones and joints after taking the initial dose of Cipro.   The history is provided by the patient.  Abdominal Pain   This is a new problem. Episode onset: 6 weeks ago. Episode frequency: Intermittently. The problem has been gradually worsening. The pain is associated with eating. The pain is located in the RUQ and epigastric region. The quality of the pain is cramping. The pain is moderate. Pertinent negatives include anorexia, fever, hematochezia, constipation, dysuria and headaches. Exacerbated by: Eating, spicy foods. Nothing relieves the symptoms.    Past Medical History:  Diagnosis Date  . Asthma     Patient Active Problem List   Diagnosis Date Noted  . Gastroesophageal reflux disease without esophagitis 12/22/2015  . Current use of beta blocker 12/22/2015  . Asthma with acute exacerbation 05/09/2015  . Acute sinusitis 05/09/2015  . Hypertension 05/09/2015  . Allergic rhinitis 05/09/2015    History reviewed. No pertinent surgical history.  OB History    No data available       Home Medications    Prior to Admission medications   Medication Sig Start Date End Date Taking? Authorizing Provider  azelastine  (ASTELIN) 0.1 % nasal spray 2 sprays per nostril twice daily 09/11/16   Bobbitt, Sedalia Muta, MD  cetirizine (ZYRTEC) 10 MG tablet Take by mouth.    [provider]  fluticasone (FLONASE) 50 MCG/ACT nasal spray Place 2 sprays into both nostrils daily. 07/02/16   Charlies Silvers, MD  fluticasone (FLOVENT HFA) 110 MCG/ACT inhaler Inhale 2 puffs into the lungs 2 (two) times daily. Patient not taking: Reported on 12/22/2015 10/11/15   Leda Roys, MD  fluticasone (FLOVENT HFA) 110 MCG/ACT inhaler Inhale 2 puffs into the lungs 2 (two) times daily. 09/11/16   Bobbitt, Sedalia Muta, MD  hydrochlorothiazide (HYDRODIURIL) 25 MG tablet Take 25 mg by mouth. 07/17/15   [provider]  levalbuterol Penne Lash HFA) 45 MCG/ACT inhaler Inhale 2 puffs into the lungs every 6 (six) hours as needed for wheezing. 10/17/16   Bobbitt, Sedalia Muta, MD  levalbuterol Penne Lash) 1.25 MG/3ML nebulizer solution Take 1.25 mg by nebulization every 6 (six) hours as needed for wheezing. 02/06/16   Charlies Silvers, MD  metoprolol succinate (TOPROL-XL) 100 MG 24 hr tablet Take 100 mg by mouth daily. Take with or immediately following a meal.    [provider]  montelukast (SINGULAIR) 10 MG tablet TAKE 1 TABLET (10 MG TOTAL) BY MOUTH DAILY. 03/26/17   Bobbitt, Sedalia Muta, MD  norethindrone (ORTHO MICRONOR) 0.35 MG tablet Take 0.35 mg by mouth. 11/15/15   [provider]  ondansetron (ZOFRAN) 4 MG tablet Take 4 mg by mouth. 06/16/16 06/16/17  [provider]  pantoprazole (PROTONIX) 40 MG tablet Take 40 mg by mouth daily. 04/13/15   [provider]  predniSONE (DELTASONE) 10 MG tablet Take 1 tablet (10 mg total) by mouth 2 (two) times daily. TAKE ONE TABLET TWICE DAILY FOR 4 DAYS THEN ONE TABLET ONE THE 5TH DAY. Patient not taking: Reported on 12/22/2015 05/16/15   Bobbitt, Sedalia Muta, MD    Family History Family History  Problem Relation Age of Onset  . Allergic rhinitis Neg Hx     . Angioedema Neg Hx   . Asthma Neg Hx   . Atopy Neg Hx   . Eczema Neg Hx   . Immunodeficiency Neg Hx   . Urticaria Neg Hx     Social History Social History  Substance Use Topics  . Smoking status: Never Smoker  . Smokeless tobacco: Never Used  . Alcohol use No     Allergies   Penicillins; Albuterol; and Losartan   Review of Systems Review of Systems  Constitutional: Negative for fever.  Gastrointestinal: Positive for abdominal pain. Negative for anorexia, constipation and hematochezia.  Genitourinary: Negative for dysuria.  Neurological: Negative for headaches.  All other systems reviewed and are negative.    Physical Exam Updated Vital Signs BP (!) 163/100 (BP Location: Right Arm) Comment: pt states she is nervous  Pulse (!) 116   Temp 98.3 F (36.8 C) (Oral)   Resp 20   LMP 03/14/2017   SpO2 100%   Physical Exam  Constitutional: She is oriented to person, place, and time. She appears well-developed and well-nourished. No distress.  HENT:  Head: Normocephalic and atraumatic.  Neck: Normal range of motion. Neck supple.  Cardiovascular: Normal rate and regular rhythm.  Exam reveals no gallop and no friction rub.   No murmur heard. Pulmonary/Chest: Effort normal and breath sounds normal. No respiratory distress. She has no wheezes.  Abdominal: Soft. Bowel sounds are normal. She exhibits no distension. There is tenderness. There is no rebound and no guarding.  There is mild tenderness in the epigastrium and right upper quadrant.  Musculoskeletal: Normal range of motion.  Neurological: She is alert and oriented to person, place, and time.  Skin: Skin is warm and dry. She is not diaphoretic.  Nursing note and vitals reviewed.    ED Treatments / Results  Labs (all labs ordered are listed, but only abnormal results are displayed) Labs Reviewed  URINALYSIS, ROUTINE W REFLEX MICROSCOPIC - Abnormal; Notable for the following:       Result Value   Hgb urine  dipstick SMALL (*)    All other components within normal limits  COMPREHENSIVE METABOLIC PANEL - Abnormal; Notable for the following:    Potassium 2.9 (*)    Glucose, Bld 102 (*)    Total Protein 8.5 (*)    AST 49 (*)    ALT 65 (*)    All other components within normal limits  CBC - Abnormal; Notable for the following:    RBC 5.21 (*)    All other components within normal limits  URINALYSIS, MICROSCOPIC (REFLEX) - Abnormal; Notable for the following:    Squamous Epithelial / LPF 0-5 (*)    All other components within normal limits  PREGNANCY, URINE  LIPASE, BLOOD    EKG  EKG Interpretation None       Radiology No results found.  Procedures Procedures (including critical care time)  Medications Ordered in ED Medications - No data to display   Initial Impression / Assessment  and Plan / ED Course  I have reviewed the triage vital signs and the nursing notes.  Pertinent labs & imaging results that were available during my care of the patient were reviewed by me and considered in my medical decision making (see chart for details).  Patient with a several week history of intermittent pain in the right upper quadrant and epigastric region with weight, especially spicy foods.  I highly suspect the gallbladder as the culprit.  She does have mild elevations of her transaminases, however no fever or white count and she is currently symptom-free.  She will be given medicine for pain and set up for an outpatient ultrasound as the ultrasound tech has left for the day.  Her urinalysis is clear and I will have her discontinue the Cipro as I suspect the specimen in the doctor's office was contaminated.  She is having no urinary symptoms.  Final Clinical Impressions(s) / ED Diagnoses   Final diagnoses:  None    New Prescriptions New Prescriptions   No medications on file     Veryl Speak, MD 04/14/17 [phone]

## 2017-04-14 NOTE — Discharge Instructions (Signed)
Hydrocodone is prescribed as needed for pain.  Potassium as prescribed.  Return tomorrow at the given time for an ultrasound to further evaluate your gallbladder.  If your ultrasound shows a gallstone or sludge within your gallbladder, you will require referral to general surgery.  The contact information for [facility] surgery has been provided in this discharge summary for you to call and make these arrangements as needed.  Return to the emergency department if you develop worsening pain, high fevers, bloody stools, or other new and concerning symptoms.

## 2017-04-22 ENCOUNTER — Ambulatory Visit (INDEPENDENT_AMBULATORY_CARE_PROVIDER_SITE_OTHER): Payer: Managed Care, Other (non HMO) | Admitting: Pediatrics

## 2017-04-22 ENCOUNTER — Encounter: Payer: Self-pay | Admitting: Pediatrics

## 2017-04-22 ENCOUNTER — Other Ambulatory Visit: Payer: Self-pay | Admitting: Allergy and Immunology

## 2017-04-22 VITALS — BP 140/110 | HR 106 | Temp 98.4°F | Resp 20

## 2017-04-22 DIAGNOSIS — J4531 Mild persistent asthma with (acute) exacerbation: Secondary | ICD-10-CM | POA: Diagnosis not present

## 2017-04-22 DIAGNOSIS — J45901 Unspecified asthma with (acute) exacerbation: Secondary | ICD-10-CM

## 2017-04-22 DIAGNOSIS — J3089 Other allergic rhinitis: Secondary | ICD-10-CM | POA: Diagnosis not present

## 2017-04-22 DIAGNOSIS — I10 Essential (primary) hypertension: Secondary | ICD-10-CM | POA: Insufficient documentation

## 2017-04-22 MED ORDER — FLUTICASONE PROPIONATE HFA 110 MCG/ACT IN AERO: INHALATION_SPRAY | RESPIRATORY_TRACT | 5 refills | Status: DC

## 2017-04-22 NOTE — Patient Instructions (Addendum)
Begin using Flovent 110, 2 inhalations twice a day with a spacer, to prevent coughing or wheeze for the next 2 weeks or instead Asmanex 220 -1 puff twice a day  Continue montelukast 10 mg daily at bedtime Continue Xopenex HFA 2 puffs  every 6 hours as needed for cough or wheeze Continue fluticasone nasal spray 2 sprays per nostril daily Begin taking prednisone 10 mg  1 tablet twice a day for 4 days, then 1 tablet on the fifth day then stop. Continue medications for hypertension Spacer provided   Call us if you are not doing well on this treatment plan.  Follow-up in 3 months or sooner as needed

## 2017-04-22 NOTE — Progress Notes (Signed)
[place] [address] Dept: [phone]  FOLLOW UP NOTE  Patient ID: Nicole Mendoza, female    DOB: 1990-06-21  Age: 26 y.o. MRN: [id number] Date of Office Visit: 04/22/2017  Assessment  Chief Complaint: Asthma  HPI Nicole Mendoza is a 26 year old female presenting to the clinic today with an acute asthma exacerbation. She was last seen in this clinic on 09/11/2016 by Dr. Verlin Fester. At that time she had acute sinusitis with an asthma with exacerbation requiring prednisone for 5 days.  At today's visit,she is reporting coughing, shortness of breath and chest tightness that began on Thursday. She went to a local urgent care on Sunday and was diagnosed with otitis media of the left ear and was prescribed Cefdinir. She reports her ear pain is beginning to subside. She has been using Xopenex [place] as needed for cough and shortness of breath. She reports no longer getting relief from the [place] beginning today. She reports taking montelukast 10 mg at bedtime every day and using Flonase nasal spray daily. She has not been using Flovent 110 or Asmanex 220 as she was worried about the continued use of a corticosteroid. She reports she has had an antibiotic 3 times in the last year and prednisone 1 time in the last year. She denies fever or sick contacts. Her cough is non-productive.  Current medications include cetirizine 10 mg, Flonase nasal spray, Xopenex HFA, montelukast 10 mg, and Protonix 40 mg tablet daily. Other medications are outlined in the chart.,   Drug Allergies:  Allergies  Allergen Reactions  . Penicillins Hives  . Albuterol Palpitations  . Losartan Rash    Physical Exam: BP (!) 140/110   Pulse (!) 106   Temp 98.4 F (36.9 C) (Oral)   Resp 20   SpO2 97%    Physical Exam  Constitutional: She is oriented to person, place, and time. She appears well-developed and well-nourished.  HENT:  Right Ear: External ear normal.  Left Ear: External ear normal.  Eyes normal.  Ears normal. Nares normal. Pharynx slightly erythematous without exudate  Cardiovascular: Normal heart sounds.  S1-S2 normal. Regular heart rate and rhythm  Pulmonary/Chest: Effort normal and breath sounds normal.  Lungs clear to auscultation  Musculoskeletal: Normal range of motion.  Neurological: She is alert and oriented to person, place, and time.  Skin: Skin is warm and dry.  Psychiatric: She has a normal mood and affect. Her behavior is normal. Judgment and thought content normal.    Diagnostics: FEV1: 3.30, FVC: 4.16. Predicted FEV1: 3.65, predicted FVC: 4.31. Thus, spirometry is in the normal range.    Assessment and Plan: 1. Allergic rhinitis   2. Essential hypertension   3. Mild persistent asthma with acute exacerbation     Meds ordered this encounter  Medications  . fluticasone (FLOVENT HFA) 110 MCG/ACT inhaler    Sig: 2 inhalations twice a day with spacer to prevent coughing or wheezing for the next 2 weeks    Dispense:  1 Inhaler    Refill:  5    Patient Instructions  Begin using Flovent 110, 2 inhalations twice a day with a spacer, to prevent coughing or wheeze for the next 2 weeks or instead Asmanex 220 -1 puff twice a day  Continue montelukast 10 mg daily at bedtime Continue Xopenex HFA 2 puffs  every 6 hours as needed for cough or wheeze Continue fluticasone nasal spray 2 sprays per nostril daily Begin taking prednisone 10 mg  1 tablet twice a day  for 4 days, then 1 tablet on the fifth day then stop. Continue medications for hypertension Spacer provided   Call us if you are not doing well on this treatment plan.  Follow-up in 3 months or sooner as needed   Return in about 3 months (around 07/23/2017), or if symptoms worsen or fail to improve.   Nicole Mendoza was seen in the clinic with Dr. Shaune Leeks today.  Thank you for the opportunity to care for this patient.  Please do not hesitate to contact me with questions.  Nicole Mendoza, M.D.  [facility]  [facility] [address] [place], [place] [address] [phone]

## 2017-04-22 NOTE — Progress Notes (Signed)
spi

## 2017-06-02 ENCOUNTER — Ambulatory Visit (INDEPENDENT_AMBULATORY_CARE_PROVIDER_SITE_OTHER): Payer: Managed Care, Other (non HMO) | Admitting: Family Medicine

## 2017-06-02 ENCOUNTER — Encounter: Payer: Self-pay | Admitting: Family Medicine

## 2017-06-02 VITALS — BP 138/82 | HR 108 | Temp 98.2°F | Resp 20 | Ht 68.0 in | Wt 284.6 lb

## 2017-06-02 DIAGNOSIS — J0110 Acute frontal sinusitis, unspecified: Secondary | ICD-10-CM

## 2017-06-02 DIAGNOSIS — K219 Gastro-esophageal reflux disease without esophagitis: Secondary | ICD-10-CM | POA: Diagnosis not present

## 2017-06-02 DIAGNOSIS — I10 Essential (primary) hypertension: Secondary | ICD-10-CM

## 2017-06-02 DIAGNOSIS — J4540 Moderate persistent asthma, uncomplicated: Secondary | ICD-10-CM

## 2017-06-02 DIAGNOSIS — J3089 Other allergic rhinitis: Secondary | ICD-10-CM

## 2017-06-02 MED ORDER — CEFDINIR 300 MG PO CAPS: ORAL_CAPSULE | ORAL | 0 refills | Status: DC

## 2017-06-02 NOTE — Patient Instructions (Addendum)
Continue using  Asmanex 220 -1 puff twice a day until breathing has returned to baseline. Continue montelukast 10 mg daily at bedtime Continue Xopenex HFA 2 puffs  every 6 hours as needed for cough or wheeze Continue fluticasone nasal spray 2 sprays per nostril daily as needed for a stuffy nose Begin taking prednisone 10 mg - 1 tablet twice a day for 4 days, then 1 tablet on the fifth day, then stop. Begin Cefdinir 300 mg by mouth every 12 hours for 10 days See ENT specialists to evaluate cause of increasing sinue infections Continue medications as outlined in the chart  Call us if you are not doing well on this treatment plan.  Follow-up in 2 months or sooner as needed

## 2017-06-02 NOTE — Progress Notes (Signed)
[place] [address] Dept: [phone]  FAMILY NURSE PRACTITIONER FOLLOW UP NOTE  Patient ID: Nicole Mendoza, female    DOB: [birth year]  Age: 26 y.o. MRN: [id number] Date of Office Visit: 06/02/2017  Assessment  Chief Complaint: Breathing Problem (Sx since 05/31/17); Nasal Congestion; Wheezing; and Cough  HPI Nicole Mendoza is a 26 year old female patient who presents to the clinic for a sick visit today. She was last seen in this office on 04/22/2017 by Dr. Shaune Leeks for a sick visit for evaluation of acute sinusitis and asthma exacerbation requiring prednisone for 5 days.   At today's visit, she is reporting watering eyes, facial puffiness, sore throat, and thick nasal drainage that began over the last 2 days. She reports the nasal drainage has turned to a green color today. She has been afebrile and denies any sick contacts. Her asthma has been well controlled until yesterday when she began to experience chest tightness, cough, and shortness of breath with activity and when lying down overnight. Nicole Mendoza began using Asmanex 220- 2 puffs this morning. She has used her Xopenex HFA inhaler 2 times yesterday. She takes Singulair 10 mg daily, flluticasone 2 sprays in each nostril daily, and Zyrtec 10 mg daily as needed. She reports gastroesophageal reflux is well controlled with pantoprazole 40 mg daily and ranitidine 150 mg daily.  Nicole Mendoza is reporting having 4 sinus infections in the last 5 months, each requiring an antibiotic and prednisone for resolution. She has received a flu shot this year.  She reports feeling intermittent chest pain and palpitations for which she has recently visited her primary care provider.She has a follow up appointment in February with cardiology specialists.   Her current medications are outlined in her chart.   Drug Allergies:  Allergies  Allergen Reactions  . Penicillins Hives  . Albuterol Palpitations  . Losartan Rash    Physical Exam: BP  138/82 (BP Location: Left Arm, Patient Position: Sitting, Cuff Size: Large)   Pulse (!) 108   Temp 98.2 F (36.8 C) (Oral)   Resp 20   Ht 5\' 8"  (1.727 m)   Wt 284 lb 9.6 oz (129.1 kg)   SpO2 99%   BMI 43.27 kg/m    Physical Exam  Constitutional: She is oriented to person, place, and time. She appears well-developed and well-nourished.  HENT:  Right Ear: External ear normal.  Left Ear: External ear normal.  Nose normal. Ears normal. Nares slightly erythematous. Pharynx slightly erythematous with no exudate noted.  Eyes: Conjunctivae are normal.  Neck: Normal range of motion. Neck supple.  Cardiovascular: Normal rate, regular rhythm and normal heart sounds.  S1-S2 normal. Regular heart rate and rhythm. No murmurs noted.  Pulmonary/Chest: Effort normal and breath sounds normal.  Lungs clear to auscultation  Musculoskeletal: Normal range of motion.  Neurological: She is alert and oriented to person, place, and time.  Skin: Skin is warm and dry.  Facial flushing noted  Psychiatric: She has a normal mood and affect. Her behavior is normal. Judgment and thought content normal.    Diagnostics: FEV1 3.17, FVC 4.03. Predicted FEV1 3.65, predicted FVC 4.31. Thus, spirometry is in the normal range.  Assessment and Plan: 1. Acute frontal sinusitis, recurrence not specified   2. Allergic rhinitis   3. Gastroesophageal reflux disease without esophagitis   4. Essential hypertension   5. Moderate persistent asthma without complication     Meds ordered this encounter  Medications  . cefdinir (OMNICEF) 300 MG capsule  Sig: One tablet by mouth every 12 hours for 10 days for infection.    Dispense:  20 capsule    Refill:  0    Patient Instructions  Continue using  Asmanex 220 -1 puff twice a day until breathing has returned to baseline. Continue montelukast 10 mg daily at bedtime Continue Xopenex HFA 2 puffs  every 6 hours as needed for cough or wheeze Continue fluticasone nasal  spray 2 sprays per nostril daily as needed for a stuffy nose Begin taking prednisone 10 mg - 1 tablet twice a day for 4 days, then 1 tablet on the fifth day, then stop. Begin Cefdinir 300 mg by mouth every 12 hours for 10 days See ENT specialists to evaluate cause of increasing sinue infections Continue medications as outlined in the chart  Call us if you are not doing well on this treatment plan.  Follow-up in 2 months or sooner as needed   Return in about 2 months (around 08/03/2017), or if symptoms worsen or fail to improve.   Nicole Mendoza was seen with Dr. Shaune Leeks in the clinic today.  Thank you for the opportunity to care for this patient.  Please do not hesitate to contact me with questions.  Gareth Morgan, FNP [facility]

## 2017-06-06 ENCOUNTER — Telehealth: Payer: Self-pay

## 2017-06-06 MED ORDER — LEVOFLOXACIN 500 MG PO TABS: 500.0000 mg | ORAL_TABLET | Freq: Every day | ORAL | 0 refills | Status: AC

## 2017-06-06 NOTE — Telephone Encounter (Signed)
Please advise 

## 2017-06-06 NOTE — Telephone Encounter (Signed)
Pt in not currently pregnant. I informed her of the levaquinn and she will let us know next week how she is doing

## 2017-06-06 NOTE — Telephone Encounter (Signed)
I will send in Levaquin 500mg  daily instead. Rx sent in. Please call patient to let her know. Please also ask whether the patient is pregnant because this cannot be used during pregnancy.   Salvatore Marvel, MD [facility] [place]

## 2017-06-06 NOTE — Addendum Note (Signed)
Addended by: Valentina Shaggy on: 06/06/2017 09:57 AM   Modules accepted: Orders

## 2017-06-06 NOTE — Telephone Encounter (Signed)
Lm for pt to call us back  

## 2017-06-06 NOTE — Telephone Encounter (Signed)
Pt was seen in clinic Monday with dr Shaune Leeks and he gave her cefdinir 300 bid and it is causing G.I. Issues. Can you send in anything different for her? Pt is still having sinus infection issues.   Please advise

## 2017-07-29 ENCOUNTER — Ambulatory Visit (INDEPENDENT_AMBULATORY_CARE_PROVIDER_SITE_OTHER): Payer: Managed Care, Other (non HMO) | Admitting: Pediatrics

## 2017-07-29 ENCOUNTER — Encounter: Payer: Self-pay | Admitting: Pediatrics

## 2017-07-29 VITALS — BP 136/68 | HR 103 | Temp 97.8°F | Resp 16

## 2017-07-29 DIAGNOSIS — J3089 Other allergic rhinitis: Secondary | ICD-10-CM | POA: Diagnosis not present

## 2017-07-29 DIAGNOSIS — J4530 Mild persistent asthma, uncomplicated: Secondary | ICD-10-CM | POA: Diagnosis not present

## 2017-07-29 DIAGNOSIS — K219 Gastro-esophageal reflux disease without esophagitis: Secondary | ICD-10-CM | POA: Diagnosis not present

## 2017-07-29 DIAGNOSIS — I10 Essential (primary) hypertension: Secondary | ICD-10-CM | POA: Diagnosis not present

## 2017-07-29 DIAGNOSIS — J4540 Moderate persistent asthma, uncomplicated: Secondary | ICD-10-CM | POA: Insufficient documentation

## 2017-07-29 MED ORDER — RANITIDINE HCL 150 MG PO TABS: 150.0000 mg | ORAL_TABLET | Freq: Two times a day (BID) | ORAL | 5 refills | Status: DC

## 2017-07-29 NOTE — Progress Notes (Signed)
[place] [address] Dept: [phone]  FOLLOW UP NOTE  Patient ID: Nicole Mendoza, female    DOB: April 16, 1991  Age: 27 y.o. MRN: [id number] Date of Office Visit: 07/29/2017  Assessment  Chief Complaint: Allergies  HPI Nicole Mendoza is a 27 year old female who presents to the clinic for follow-up.  She was last seen in the clinic 06/02/2017 by Gareth Morgan, FNP for evaluation of acute sinusitis, asthma, and allergic rhinitis.  At that time, she required a course of Cefdinir and a prednisone taper for resolution of her symptoms.   At today's visit, she reports her asthma has been well controlled. She denies shortness of breath, cough, and wheeze. She has not visited an emergency department or urgent care nor has she needed an antibiotic or prednisone since her last visit to this office. Her ACT score today is 22, indicating excellent control of asthma. She reports she currently uses Asmanex 220 - 2 puffs once a day only as needed, which is less than once a week, and her Xopenex inhaler about once a week. She continues to take montelukast 10 mg once a day.   Allergic rhinitis is reported as well controlled with fluticasone nasal spray 2 sprays in each nostril once a day and cetirizine 10 mg once a day. She is reporting a thick nasal drainage in the back of her throat when she begins eating that began about 1 month ago.   Gastroesophageal reflux is reported as not well controlled. She reports heartburn several times a week which is worsened by ingestion of oranges. She is currently taking pantoprazole 40 mg once a day and ranitidine 150 mg once a day.     Drug Allergies:  Allergies  Allergen Reactions  . Penicillins Hives  . Albuterol Palpitations  . Losartan Rash    Physical Exam: BP 136/68   Pulse (!) 103   Temp 97.8 F (36.6 C) (Oral)   Resp 16   SpO2 98%    Physical Exam  Constitutional: She is oriented to person, place, and time. She appears well-developed and  well-nourished.  HENT:  Right Ear: External ear normal.  Left Ear: External ear normal.  Nose: Nose normal.  Mouth/Throat: Oropharynx is clear and moist.  Eyes normal.  Ears normal.  Pharynx normal.  Bilateral nares slightly erythematous and edematous with no drainage noted.  Eyes: Conjunctivae are normal.  Neck: Normal range of motion. Neck supple.  Cardiovascular: Normal rate, regular rhythm and normal heart sounds.  S1-S2 normal.  Regular heart rate and rhythm.  No murmur noted.  Pulmonary/Chest: Effort normal and breath sounds normal.  Lungs clear to auscultation  Musculoskeletal: Normal range of motion.  Neurological: She is alert and oriented to person, place, and time.  Skin: Skin is warm and dry.  Psychiatric: She has a normal mood and affect. Her behavior is normal. Judgment and thought content normal.    Diagnostics: FVC 4.12, FEV1 3.24.  Predicted FVC 4.31 predicted FEV1 3.65.  Spirometry is within the normal range.  Assessment and Plan: 1. Mild persistent asthma without complication   2. Gastroesophageal reflux disease without esophagitis   3. Essential hypertension   4. Allergic rhinitis     Meds ordered this encounter  Medications  . ranitidine (ZANTAC) 150 MG tablet    Sig: Take 1 tablet (150 mg total) by mouth 2 (two) times daily.    Dispense:  60 tablet    Refill:  5    Patient Instructions  Add Asmanex  220 -1 puff twice a day to prevent cough, wheeze, and shortness of breath if asthma is not well controlled Continue montelukast 10 mg daily at bedtime Continue Xopenex HFA 2 puffs  every 6 hours as needed for cough or wheeze Continue fluticasone nasal spray 2 sprays per nostril daily as needed for a stuffy nose Begin nasal saline rinse before bedtime Continue cetirizine 10 mg once a day Begin journal including foods eaten and weather patterns Increase ranitidine to 150 mg twice a day  Continue medications as outlined in the chart  Call us if you are  not doing well on this treatment plan.  Follow-up in 6  months or sooner as needed   Return in about 6 months (around 01/26/2018), or if symptoms worsen or fail to improve.   Nicole Mendoza was seen in the clinic with Dr. Shaune Leeks today.  Gareth Morgan, FNP [facility] [place]  Thank you for the opportunity to care for this patient.  Please do not hesitate to contact me with questions.  Penne Lash, M.D.  [facility] [address] [place], [place] [address] [phone]

## 2017-07-29 NOTE — Patient Instructions (Addendum)
Add Asmanex 220 -1 puff twice a day to prevent cough, wheeze, and shortness of breath if asthma is not well controlled Continue montelukast 10 mg daily at bedtime Continue Xopenex HFA 2 puffs  every 6 hours as needed for cough or wheeze Continue fluticasone nasal spray 2 sprays per nostril daily as needed for a stuffy nose Begin nasal saline rinse before bedtime Continue cetirizine 10 mg once a day Begin journal including foods eaten and weather patterns Increase ranitidine to 150 mg twice a day  Continue medications as outlined in the chart  Call us if you are not doing well on this treatment plan.  Follow-up in 6  months or sooner as needed

## 2017-08-22 ENCOUNTER — Other Ambulatory Visit: Payer: Self-pay | Admitting: Pediatrics

## 2017-08-22 DIAGNOSIS — J3089 Other allergic rhinitis: Secondary | ICD-10-CM

## 2017-08-22 DIAGNOSIS — J45901 Unspecified asthma with (acute) exacerbation: Secondary | ICD-10-CM

## 2017-09-23 ENCOUNTER — Encounter: Payer: Self-pay | Admitting: Family Medicine

## 2017-09-23 ENCOUNTER — Ambulatory Visit (INDEPENDENT_AMBULATORY_CARE_PROVIDER_SITE_OTHER): Payer: Managed Care, Other (non HMO) | Admitting: Family Medicine

## 2017-09-23 VITALS — BP 124/92 | HR 102 | Temp 98.2°F | Resp 16

## 2017-09-23 DIAGNOSIS — J3089 Other allergic rhinitis: Secondary | ICD-10-CM | POA: Diagnosis not present

## 2017-09-23 DIAGNOSIS — J0100 Acute maxillary sinusitis, unspecified: Secondary | ICD-10-CM | POA: Diagnosis not present

## 2017-09-23 DIAGNOSIS — J4541 Moderate persistent asthma with (acute) exacerbation: Secondary | ICD-10-CM | POA: Diagnosis not present

## 2017-09-23 MED ORDER — LEVALBUTEROL HCL 1.25 MG/3ML IN NEBU: 1.2500 mg | INHALATION_SOLUTION | Freq: Four times a day (QID) | RESPIRATORY_TRACT | 2 refills | Status: DC | PRN

## 2017-09-23 MED ORDER — CEFDINIR 300 MG PO CAPS: ORAL_CAPSULE | ORAL | 0 refills | Status: DC

## 2017-09-23 NOTE — Patient Instructions (Addendum)
Asmanex 220 -1 puff twice a day to prevent cough, wheeze, and shortness of breath if asthma is not well controlled Continue montelukast 10 mg daily at bedtime Continue Xopenex HFA 2 puffs  every 6 hours as needed for cough or wheeze Continue fluticasone nasal spray 2 sprays per nostril daily as needed for a stuffy nose Begin nasal saline rinse before bedtime Allegra 180 mg once a day as needed for a runny nose Cefdinir 300 mg capsule. Take  2 capsules every 24 hours for 10 days for infection  Prednisone 10 mg. Take 2 tablets once a day for 4 days, then take 1 tablet for 1 day, then stop.  Continue medications as outlined in the chart  Call us if you are not doing well on this treatment plan.  Follow-up in 2  months or sooner as needed

## 2017-09-23 NOTE — Progress Notes (Signed)
[place] [address] Dept: [phone]  FOLLOW UP NOTE  Patient ID: Nicole Mendoza, female    DOB: [birth year]  Age: 27 y.o. MRN: [id number] Date of Office Visit: 09/23/2017  Assessment  Chief Complaint: Sinus Problem (started friday morning with dry cough) and Cough (green mucus)  HPI Nicole Mendoza is a 27 year old female who presents to the clinic for a sick visit. She was last seen in this clinic on 07/29/2017 by Dr. Shaune Leeks for evaluation of asthma, allergic rhinitis, and gastroesophageal reflux which were reported as well controlled.   At today's visit, she reported that she began to have a dry cough on Friday and developed a fever later that night.  The following day she presented to a local urgent care clinic and a rapid influenza test was noted to be negative, however, she continued to experience a cough with green thick sputum and right sided facial pain.  On Sunday, she returned to the urgent care clinic with a fever of 102 and received promethazine DM and Tessalon Perles in addition to prednisone.  She did not begin the prednisone at that time.  Today she reports she is afebrile.  She is experiencing shortness of breath with activity, chest tightness, a gurgling sound in her throat, cough with green thick sputum which is worse at night, right-sided sinus pressure and headache, and left sided pressure over her cheekbone.  She is currently using Asmanex 220-2 puffs twice a day, montelukast 10 mg once a day, and Xopenex as needed.  She continues to take Zyrtec and Flonase daily.  Her current medications are listed in the chart.  Drug Allergies:  Allergies  Allergen Reactions  . Budesonide-Formoterol Fumarate Other (See Comments)    Causes Hypertension  . Penicillins Hives  . Albuterol Palpitations  . Losartan Rash    Physical Exam: BP (!) 124/92   Pulse (!) 102   Temp 98.2 F (36.8 C) (Oral)   Resp 16   SpO2 96%    Physical Exam  Constitutional: She is  oriented to person, place, and time. She appears well-developed and well-nourished.  HENT:  Head: Normocephalic and atraumatic.  Right Ear: External ear normal.  Left Ear: External ear normal.  Bilateral nares erythematous and edematous with clear nasal drainage noted.  Ears normal.  Eyes normal.  Pharynx slightly erythematous with no exudate noted.  Eyes: Conjunctivae are normal.  Neck: Normal range of motion. Neck supple.  Cardiovascular: Regular rhythm and normal heart sounds.  No murmur noted  Pulmonary/Chest: Effort normal and breath sounds normal.  Lungs clear to auscultation  Musculoskeletal: Normal range of motion.  Neurological: She is alert and oriented to person, place, and time.  Skin: Skin is warm and dry.  Psychiatric: She has a normal mood and affect. Her behavior is normal. Judgment and thought content normal.    Diagnostics: FVC 4.11, FEV1 3.06.  Predicted FVC 4.31, predicted FEV1 3.65.  Spirometry is within the normal range.  Assessment and Plan: 1. Moderate persistent asthma with acute exacerbation   2. Allergic rhinitis   3. Acute maxillary sinusitis, recurrence not specified     Meds ordered this encounter  Medications  . cefdinir (OMNICEF) 300 MG capsule    Sig: Two capsules once a day x 10 days for infection    Dispense:  20 capsule    Refill:  0  . levalbuterol (XOPENEX) 1.25 MG/3ML nebulizer solution    Sig: Take 1.25 mg by nebulization every 6 (six) hours  as needed for wheezing.    Dispense:  270 mL    Refill:  2    Patient Instructions  Asmanex 220 -1 puff twice a day to prevent cough, wheeze, and shortness of breath if asthma is not well controlled Continue montelukast 10 mg daily at bedtime Continue Xopenex HFA 2 puffs  every 6 hours as needed for cough or wheeze Continue fluticasone nasal spray 2 sprays per nostril daily as needed for a stuffy nose Begin nasal saline rinse before bedtime Allegra 180 mg once a day as needed for a runny  nose Cefdinir 300 mg capsule. Take  2 capsules every 24 hours for 10 days for infection  Prednisone 10 mg. Take 2 tablets once a day for 4 days, then take 1 tablet for 1 day, then stop.  Continue medications as outlined in the chart  Call us if you are not doing well on this treatment plan.  Follow-up in 2  months or sooner as needed   Return in about 2 months (around 11/23/2017), or if symptoms worsen or fail to improve.   Thank you for the opportunity to care for this patient.  Please do not hesitate to contact me with questions.  Gareth Morgan, FNP [facility]  I have provided oversight concerning Gareth Morgan' evaluation and treatment of this patient's health issues addressed during today's encounter. I agree with the assessment and therapeutic plan as outlined in the note.   Thank you for the opportunity to care for this patient.  Please do not hesitate to contact me with questions.  Penne Lash, M.D.  [facility] [address] [place], [place] [address] [phone]

## 2017-10-28 DIAGNOSIS — Z6841 Body Mass Index (BMI) 40.0 and over, adult: Secondary | ICD-10-CM | POA: Insufficient documentation

## 2017-11-07 ENCOUNTER — Ambulatory Visit (INDEPENDENT_AMBULATORY_CARE_PROVIDER_SITE_OTHER): Payer: Managed Care, Other (non HMO) | Admitting: Allergy & Immunology

## 2017-11-07 ENCOUNTER — Encounter: Payer: Self-pay | Admitting: Allergy & Immunology

## 2017-11-07 VITALS — BP 150/90 | HR 95 | Temp 98.0°F | Resp 16

## 2017-11-07 DIAGNOSIS — J4540 Moderate persistent asthma, uncomplicated: Secondary | ICD-10-CM

## 2017-11-07 DIAGNOSIS — J3089 Other allergic rhinitis: Secondary | ICD-10-CM | POA: Diagnosis not present

## 2017-11-07 DIAGNOSIS — T7800XD Anaphylactic reaction due to unspecified food, subsequent encounter: Secondary | ICD-10-CM | POA: Diagnosis not present

## 2017-11-07 MED ORDER — MOMETASONE FUROATE 220 MCG/INH IN AEPB: 1.0000 | INHALATION_SPRAY | Freq: Two times a day (BID) | RESPIRATORY_TRACT | 5 refills | Status: DC

## 2017-11-07 NOTE — Progress Notes (Signed)
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 Mild persistent asthma without complication - Lung testing looks fairly good today. - We will not make any medication changes at this time.  - Daily controller medication(s): Singulair 10mg  daily - Prior to physical activity: ProAir 2 puffs 10-15 minutes before physical activity. - Rescue medications: ProAir 4 puffs every 4-6 hours as needed - Changes during respiratory infections or worsening symptoms: Add on Asmanex 258mcg to 1 puff twice daily for ONE TO TWO WEEKS. - Asthma control goals:  * Full participation in all desired activities (may need albuterol before activity) * Albuterol use two time or less a week on average (not counting use with activity) * Cough interfering with sleep two time or less a month * Oral steroids no more than once a year * No hospitalizations  2. Allergic rhinitis - Continue with Singulair 10mg  daily. - Continue with cetirizine 10mg  daily. - Continue with Flonase every day.   3. Adverse food reactions - Avoid peanuts and tree nuts for now.  - We will get blood work to look for peanuts and tree nuts allergies.  - These are the most likely triggers from your symptoms. - We will send in a prescription for AuviQ (epinephrine).  4. Return in about 3 months (around 02/07/2018).   Subjective:   Nicole Mendoza is a 27 y.o. female presenting today for follow up of  Chief Complaint  Patient presents with  . Asthma    the past month she has been itching and throat feels funny    Nicole Mendoza has a history of the following: Patient Active Problem List   Diagnosis Date Noted  . Mild persistent asthma without complication 79/15/0569  . Essential hypertension 04/22/2017  .  Gastroesophageal reflux disease without esophagitis 12/22/2015  . Current use of beta blocker 12/22/2015  . Asthma with acute exacerbation 05/09/2015  . Acute maxillary sinusitis 05/09/2015  . Hypertension 05/09/2015  . Allergic rhinitis 05/09/2015    History obtained from: chart review and paitent.  [facility] Primary Care Provider is Oren Section, NP-C.     Nicole Mendoza is a 28 y.o. female presenting for a follow up visit. She was last seen in early April 2019 by Gareth Morgan for a sick visit. At that time, she was having a dry cough with a fever. She had presented to Urgent Care and a rapid flu was negative. She was treated with steroids, promethazine, and Tessalon pearls without improvement. At that visit with [name], she was continued on Asmane 29mcg one puff twice daily, Singulair 10mg  daily, Xopenex two puffs every 4-6 hours, and fluticasone nasal spray as needed. She was started on nasal saline rinses, cefdinir 300mg  BID for ten days, and a low dose prednisone burst.   Since the last visit, she has done well. Asthma has been controlled with the use of the Asmanex as needed. She remains on the Singulair 10mg  daily. She is not using the albuterol very often. She does have prednisone from various exacerbations since she tends not to complete the courses. She estimates that she will use 1-2 tablets of prednisone each month to control symptoms. She has not needed ED visits or UC visits for asthma. She denies that she is coughing at night at all.   She  reports that she has been itching 45 minutes after eating. She does have throat swelling and tightness. She does have severe GI issues and reflux. She is wondering whether she is developing a food allergy. She did go to have a blood test performed which was almost $200. She was eating a [employer]ese[employer] cup at one point and she developed throat problems. She does not have an EpiPen.     She had a weird sensation with her tongue as well. She did take Benadryl  and this helped. She does not always take Benadryl. She eats carrots, corn, onions, and cooks with garlic. She does not eat aspragus. She is able to tolerate a multitude of beans within out a problem. She is not sure how legitimate the testing is and is interested in getting an official reading on these results.   Otherwise, there have been no changes to her past medical history, surgical history, family history, or social history. She works as an Therapist, sports with a Insurance claims handler.      Review of Systems: a 14-point review of systems is pertinent for what is mentioned in HPI.  Otherwise, all other systems were negative. Constitutional: negative other than that listed in the HPI Eyes: negative other than that listed in the HPI Ears, nose, mouth, throat, and face: negative other than that listed in the HPI Respiratory: negative other than that listed in the HPI Cardiovascular: negative other than that listed in the HPI Gastrointestinal: negative other than that listed in the HPI Genitourinary: negative other than that listed in the HPI Integument: negative other than that listed in the HPI Hematologic: negative other than that listed in the HPI Musculoskeletal: negative other than that listed in the HPI Neurological: negative other than that listed in the HPI Allergy/Immunologic: negative other than that listed in the HPI    Objective:   Blood pressure (!) 150/90, pulse 95, temperature 98 F (36.7 C), temperature source Oral, resp. rate 16, SpO2 97 %. There is no height or weight on file to calculate BMI.   Physical Exam:  General: Alert, interactive, in no acute distress. Pleasant. Obese.  Eyes: No conjunctival injection bilaterally, no discharge on the right, no discharge on the left, no Horner-Trantas dots present and allergic shiners present bilaterally. PERRL bilaterally. EOMI without pain. No photophobia.  Ears: Right TM pearly gray with normal light reflex, Left TM pearly gray with  normal light reflex, Right TM intact without perforation and Left TM intact without perforation.  Nose/Throat: External nose within normal limits and septum midline. Turbinates minimally edematous without discharge. Posterior oropharynx mildly erythematous without cobblestoning in the posterior oropharynx. Tonsils 2+ without exudates.  Tongue without thrush. Lungs: Clear to auscultation without wheezing, rhonchi or rales. No increased work of breathing. CV: Normal S1/S2. No murmurs. Capillary refill <2 seconds.  Skin: Warm and dry, without lesions or rashes. Neuro:   Grossly intact. No focal deficits appreciated. Responsive to questions.  Diagnostic studies:  Spirometry: results normal (FEV1: 3.13/86%, FVC: 3.96/92%, FEV1/FVC: 79%).    Spirometry consistent with normal pattern.   Allergy Studies: none        Salvatore Marvel, MD  [facility] [place]

## 2017-11-07 NOTE — Patient Instructions (Addendum)
1. Moderate persistent asthma without complication - Lung testing looks fairly good today. - We will not make any medication changes at this time.  - Daily controller medication(s): Singulair 10mg  daily - Prior to physical activity: ProAir 2 puffs 10-15 minutes before physical activity. - Rescue medications: ProAir 4 puffs every 4-6 hours as needed - Changes during respiratory infections or worsening symptoms: Add on Asmanex 250mcg to 1 puff twice daily for ONE TO TWO WEEKS. - Asthma control goals:  * Full participation in all desired activities (may need albuterol before activity) * Albuterol use two time or less a week on average (not counting use with activity) * Cough interfering with sleep two time or less a month * Oral steroids no more than once a year * No hospitalizations  2. Allergic rhinitis - Continue with Singulair 10mg  daily. - Continue with cetirizine 10mg  daily. - Continue with Flonase every day.   3. Adverse food reactions - Avoid peanuts and tree nuts for now.  - We will get blood work to look for peanuts and tree nuts allergies.  - These are the most likely triggers from your symptoms. - We will send in a prescription for AuviQ (epinephrine).  4. Return in about 3 months (around 02/07/2018).   Please inform us of any Emergency Department visits, hospitalizations, or changes in symptoms. Call us before going to the ED for breathing or allergy symptoms since we might be able to fit you in for a sick visit. Feel free to contact us anytime with any questions, problems, or concerns.  It was a pleasure to meet you today!  Websites that have reliable patient information: 1. American Academy of Asthma, Allergy, and Immunology: www.aaaai.org 2. Food Allergy Research and Education (FARE): foodallergy.org 3. Mothers of Asthmatics: http://www.asthmacommunitynetwork.org 4. American College of Allergy, Asthma, and Immunology: MonthlyElectricBill.co.uk   Make sure you are registered to  vote!

## 2017-11-15 LAB — ALLERGY PANEL 18, NUT MIX GROUP
Peanut IgE: <0.1 kU/L
Pecan Nut IgE: <0.1 kU/L
Sesame Seed IgE: <0.1 kU/L

## 2017-11-15 LAB — IGE PEANUT COMPONENT PROFILE
F352-IgE Ara h 8: <0.1 kU/L
F422-IgE Ara h 1: <0.1 kU/L
F423-IgE Ara h 2: <0.1 kU/L
F424-IgE Ara h 3: <0.1 kU/L
F427-IgE Ara h 9: <0.1 kU/L
F447-IgE Ara h 6: <0.1 kU/L

## 2017-11-18 ENCOUNTER — Telehealth: Payer: Self-pay

## 2017-11-18 NOTE — Telephone Encounter (Signed)
Pt. Had blood work done last week. Can you please review lab?

## 2017-11-19 ENCOUNTER — Other Ambulatory Visit: Payer: Self-pay | Admitting: *Deleted

## 2017-11-19 MED ORDER — EPINEPHRINE 0.3 MG/0.3ML IJ SOAJ: INTRAMUSCULAR | 1 refills | Status: DC

## 2017-11-19 NOTE — Telephone Encounter (Signed)
Last night

## 2017-11-19 NOTE — Telephone Encounter (Addendum)
Nicole Mendoza has called pt. And has given lab results and has sent in the auvi q. Pt will then reintroduce peanut and nuts. When the auvi q is received. Pt. Did stated she did have another episode with throat tightness and itchy face/neck yesterday.

## 2017-11-21 NOTE — Telephone Encounter (Signed)
Nicole Mendoza answered the phone when Nicole Mendoza called her. Nicole Mendoza reports that she had an itching sensation last night in the evening before she went to sleep over her entire body, mostly on her face and neck. She gets red and blotchy. She did have some tightness in her throat. It has been a number of years since she was skin tested. She guesses it was at least five years, and review of her chart shows that it was definitely prior to transition to Epic.   She would like repeat testing to see if there is an environmental trigger for her symptoms. We scheduled her for Monday June 17th at 8am in [place] for skin testing since this worked better for her schedule.   Salvatore Marvel, MD [facility] [place]

## 2017-11-21 NOTE — Telephone Encounter (Signed)
I attempted to call Chenell back to discuss plans from here. I think we should get her in for skin testing since it has been so long. Please try to schedule her for skin testing. I am sure we can accommodate her in the [place] sometime. We could also schedule on one of my late Thursdays in [place].   Salvatore Marvel, MD [facility] [place]

## 2017-11-24 ENCOUNTER — Ambulatory Visit: Payer: Managed Care, Other (non HMO) | Admitting: Family Medicine

## 2017-12-01 ENCOUNTER — Ambulatory Visit (INDEPENDENT_AMBULATORY_CARE_PROVIDER_SITE_OTHER): Payer: Managed Care, Other (non HMO) | Admitting: Allergy & Immunology

## 2017-12-01 ENCOUNTER — Encounter: Payer: Self-pay | Admitting: Allergy & Immunology

## 2017-12-01 VITALS — BP 140/82 | HR 101 | Resp 18

## 2017-12-01 DIAGNOSIS — J4530 Mild persistent asthma, uncomplicated: Secondary | ICD-10-CM | POA: Diagnosis not present

## 2017-12-01 DIAGNOSIS — J302 Other seasonal allergic rhinitis: Secondary | ICD-10-CM | POA: Diagnosis not present

## 2017-12-01 DIAGNOSIS — T7800XD Anaphylactic reaction due to unspecified food, subsequent encounter: Secondary | ICD-10-CM

## 2017-12-01 DIAGNOSIS — J3089 Other allergic rhinitis: Secondary | ICD-10-CM

## 2017-12-01 NOTE — Progress Notes (Signed)
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  These episodes frequently follow dinner and are not always associated with the same food.  She would like clarification on her triggers.  They are slightly responsive to antihistamines.  She has never needed her epinephrine autoinjector.  These have been going on for around 6 months, and at her last visit she shared results of on validated testing performed at a naturopathic provider.  It showed sensitizations to garlic, tree nuts, peanut, onion, corn, carrots, asparagus, and beans.  It is unclear what this testing showed, but since she had a definite reaction following ingestion of peanut butter we did get a peanut and tree nut panel which was completely negative.  We did give a prescription for Auvi-Q out of an abundance of caution.  Testing today is unfortunately not extremely revealing with regards to her foods.  She rarely eats peaches, but tolerates them fine.  She does eat mushrooms all of the time and occasionally eats pecan.  She is unsure of her oat exposure and she does sometimes use black pepper.  The most reactive food today was Saccharomyces, I recommended that she avoid this for a couple of weeks to see if it helps.  On further discussion with her, it seems that she does have a diet that is high in processed foods, especially fast food.  This could be a manifestation of monosodium glutamate or another food additive.  She does note that when she tends to eat at home the reactions occur less  frequently.  In fact, over the last 2 weeks she has not eaten out and she has had much fewer reactions.  This could also be a reaction to sulfites, and we did provide information on this during her visit.  We clearly have not figured it out completely, but in the interim we do have some steps we can take to elucidate any triggers.  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  Her environmental allergy testing does explain her rhinitis symptoms since these are worse during the spring and summer season.  It would not explain her throat swelling, however, unless this is a manifestation of oral allergy syndrome.   Plan/Recommendations:   1. Seasonal and perennial allergic rhinitis - Testing today showed: trees, weeds, grasses and dust mites - Avoidance measures provided. - Continue with: Zyrtec (cetirizine) 10mg  tablet once daily and Singulair (montelukast) 10mg  daily - You can use an extra dose of the antihistamine, if needed, for breakthrough symptoms.  - Consider nasal saline rinses 1-2 times daily to remove allergens from the nasal cavities as well as help with mucous clearance (this is especially helpful to do before the nasal sprays are given) - Consider allergy shots as a means of long-term control. - Allergy shots "re-train" and "reset" the immune system to ignore environmental allergens and decrease the resulting immune response to those allergens (sneezing, itchy watery eyes, runny nose, nasal congestion,  etc).    - Allergy shots improve symptoms in 75-85% of patients.  - We can discuss more at the next appointment if the medications are not working for you.  2. Adverse food reaction - Testing was positive to: Pecan, Oat, Saccharomycese Cerevisiae, Mushroom, Peach and Black Pepper (sacchraromyces was definitely reactive, but  the others were only slightly reactive) - Avoid the above foods for two weeks to see if this helps with your symptoms.  - Testing was negative to Peanut, Soy, Wheat, Sesame, Milk, Egg, Casein, Shellfish Mix , Fish Mix, [place], [place], [place], [place], [place] nut, [place], [place], [place], [place], [place], [place], [place], [place], [place], [place], [place], [place], [place], [place], [place], Rye, Hops, Rice, [place], [place], [place], Chicken, [place], [place], Tomato, White Potato, Sweet Potato, Green Pea, [employer], Avocado, Onion, Cabbage, Carrots, Celery, Corn, Cucumber, Grape, Orange, Banana, Apple, Strawberry, Cantaloupe, Watermelon , Pineapple, Chocolate, Karaya Gum, Acacia (Arabic Gum), Cinnamon , Nutmeg, Ginger, Garlic and Mustard - Training for epinephrine auto-injectors provided: AuviQ - There is a the low positive predictive value of food allergy testing and hence the high possibility of false positives. - In contrast, food allergy testing has a high negative predictive value, therefore if testing is negative we can be relatively assured that they are indeed negative.  - It is difficult to know how foods allergies will progress.  - Consider starting Xolair for treatment of idiopathic anaphylaxis (we would get it approved with a chronic urticaria diagnosis). - Also consider decreasing things such as MSG in your diet to see if this helps. - Another consideration is a sulfite allergy, but this typically only presents with worsening lung function.   3. Mild persistent asthma - Lung testing looked stable. - We will not make any medication changes at this time.     4. Return in about 3 months (around 03/03/2018).  Subjective:   Nicole Mendoza is a 27 y.o. female presenting today for follow up of  Chief Complaint  Patient presents with  . Allergy Testing    Nicole Mendoza has a history of the following: Patient Active Problem List   Diagnosis Date Noted  . Mild persistent asthma without complication  78/24/2353  . Essential hypertension 04/22/2017  . Gastroesophageal reflux disease without esophagitis 12/22/2015  . Current use of beta blocker 12/22/2015  . Asthma with acute exacerbation 05/09/2015  . Acute maxillary sinusitis 05/09/2015  . Hypertension 05/09/2015  . Allergic rhinitis 05/09/2015    History obtained from: chart review and patient and her partner.  [facility] Primary Care Provider is Oren Section, NP-C.     Kimberlynn is a 27 y.o. female presenting for a skin testing.  I last saw Rhett on Nov 07, 2017 for the first time.  At that time, her lung testing looked fairly good.  We continued her on Singulair 10 mg daily as well as pro-air as needed.  She does have Asmanex that she adds during respiratory flares.  For her allergic rhinitis we continued her on Singulair 10 mg daily, cetirizine 10 mg daily, and Flonase daily.  She has a history of adverse food reactions, although her testing has been inconsistent.  She went to see a naturopathic doctor and had a blood test performed which did not necessarily fit with her symptoms.  Her reactions include throat swelling and tightness approximately 45 minutes after eating as well as itching.  She does have a history of GI issues and reflux.  She had a recent peanut butter cup and developed  throat problems.  She did not have an EpiPen.  At the last visit, we did send in a prescription for Auvi-Q.  We also obtained blood work to try to clarify this nut reaction.  We obtained a component testing for peanuts which was negative as well as a not panel which was negative.  She requested additional testing to clarify her reactions, which brings her in today.  Since the last visit, she has done very well.  She is excited about getting her testing done today.  She called me last night and was beside herself with pruritus.  I recommended that she use a lidocaine aloe spray to try to control this, which she did do with fairly good results.  She was able  to sleep from about midnight to 5 AM this morning.  She also has prednisone at home, and I recommended that she take 10 mg to try to control this.  She does not take large doses of prednisone as it affects her blood pressure.  In the interim, she is continued to have these reactions.  They typically consist of a confluent rash with intermittent raised hives.  They are extremely pruritic, and once they resolve they do leave normal skin.  She has had no fevers with these reactions.  She has had a couple of more episodes of the throat swelling.  She has not used her epinephrine autoinjector.  After the last visit, she did receive her Auvi-Q in the mail which was a very seamless process.    Asthma has been well controlled.  She did use a couple puffs of albuterol yesterday, but otherwise has not required the use of her rescue inhaler.  She remains on her montelukast 10 mg daily.  She has no history of eczema.  She does have what looks like keratosis pilaris on her upper arms.  Otherwise, there have been no changes to her past medical history, surgical history, family history, or social history.    Review of Systems: a 14-point review of systems is pertinent for what is mentioned in HPI.  Otherwise, all other systems were negative. Constitutional: negative other than that listed in the HPI Eyes: negative other than that listed in the HPI Ears, nose, mouth, throat, and face: negative other than that listed in the HPI Respiratory: negative other than that listed in the HPI Cardiovascular: negative other than that listed in the HPI Gastrointestinal: negative other than that listed in the HPI Genitourinary: negative other than that listed in the HPI Integument: negative other than that listed in the HPI Hematologic: negative other than that listed in the HPI Musculoskeletal: negative other than that listed in the HPI Neurological: negative other than that listed in the HPI Allergy/Immunologic: negative  other than that listed in the HPI    Objective:   Blood pressure 140/82, pulse (!) 101, resp. rate 18, SpO2 99 %. There is no height or weight on file to calculate BMI.   Physical Exam:  General: Alert, interactive, in no acute distress. Pleasant. Red faced and clearly uncomfortable with itching.  Eyes: No conjunctival injection bilaterally, no discharge on the right, no discharge on the left, no Horner-Trantas dots present and allergic shiners present bilaterally. PERRL bilaterally. EOMI without pain. No photophobia.  Ears: Right TM pearly gray with normal light reflex, Left TM pearly gray with normal light reflex, Right TM intact without perforation and Left TM intact without perforation.  Nose/Throat: External nose within normal limits and nasal crease present. Turbinates edematous  without discharge. Posterior oropharynx erythematous with cobblestoning in the posterior oropharynx. Tonsils 2+ without exudates.  Tongue without thrush. Lungs: Clear to auscultation without wheezing, rhonchi or rales. No increased work of breathing. CV: Normal S1/S2. No murmurs. Capillary refill <2 seconds.  Skin: Warm and dry, without lesions or rashes. Keratosis pilaris on the upper arms.  Neuro:   Grossly intact. No focal deficits appreciated. Responsive to questions.  Diagnostic studies:   Spirometry: results normal (FEV1: 2.64/72%, FVC: 3.20/74%, FEV1/FVC: 83%).    Spirometry consistent with possible restrictive disease likely secondary to body habitus.  Allergy Studies:   Indoor/Outdoor Percutaneous Adult Environmental Panel: positive to bahia grass, [place] blue grass, meadow fescue grass, perennial rye grass, sweet vernal grass, timothy grass, English plantain, sheep sorrel, hickory, maple, oak, pecan pollen, Df mite, Dp mites and tobacco. Otherwise negative with adequate controls.  Full Food Panel: positive to Pecan (1x1), Oat (1x1), Saccharomycese Cerevisiae (3x9), Mushroom (1x1), Peach (1x1)  and Black Pepper (1x1) with adequate controls. Negative to Pecan, Oat, Saccharomycese Cerevisiae, Mushroom, Peach and Black Pepper   Allergy testing results were read and interpreted by myself, documented by clinical staff.      Salvatore Marvel, MD  [facility] and [facility] [place]

## 2017-12-01 NOTE — Patient Instructions (Addendum)
1. Seasonal and perennial allergic rhinitis - Testing today showed: trees, weeds, grasses and dust mites - Avoidance measures provided. - Continue with: Zyrtec (cetirizine) 10mg  tablet once daily and Singulair (montelukast) 10mg  daily - You can use an extra dose of the antihistamine, if needed, for breakthrough symptoms.  - Consider nasal saline rinses 1-2 times daily to remove allergens from the nasal cavities as well as help with mucous clearance (this is especially helpful to do before the nasal sprays are given) - Consider allergy shots as a means of long-term control. - Allergy shots "re-train" and "reset" the immune system to ignore environmental allergens and decrease the resulting immune response to those allergens (sneezing, itchy watery eyes, runny nose, nasal congestion, etc).    - Allergy shots improve symptoms in 75-85% of patients.  - We can discuss more at the next appointment if the medications are not working for you.  2. Adverse food reaction - Testing was positive to: Pecan, Oat, Saccharomycese Cerevisiae, Mushroom, Peach and Black Pepper (sacchraromyces was definitely reactive, but the others were only slightly reactive) - Avoid the above foods for two weeks to see if this helps with your symptoms.  - Testing was negative to Peanut, Soy, Wheat, Sesame, Milk, Egg, Casein, Shellfish Mix , Fish Mix, [place], [place], [place], [place], [place] nut, [place], [place], [place], [place], [place], [place], [place], [place], [place], [place], [place], [place], [place], [place], [place], Rye, Hops, Rice, [place], [place], [place], Chicken, [place], [place], Tomato, White Potato, Sweet Potato, Green Pea, [employer], Avocado, Onion, Cabbage, Carrots, Celery, Corn, Cucumber, Grape, Orange, Banana, Apple, Strawberry, Cantaloupe, Watermelon , Pineapple, Chocolate, Karaya Gum, Acacia (Arabic Gum), Cinnamon , Nutmeg, Ginger, Garlic and Mustard - Training for epinephrine auto-injectors provided: AuviQ - There is a the low  positive predictive value of food allergy testing and hence the high possibility of false positives. - In contrast, food allergy testing has a high negative predictive value, therefore if testing is negative we can be relatively assured that they are indeed negative.  - It is difficult to know how foods allergies will progress.  - Consider starting Xolair for treatment of idiopathic anaphylaxis (we would get it approved with a chronic urticaria diagnosis). - Also consider decreasing things such as MSG in your diet to see if this helps. - Another consideration is a sulfite allergy, but this typically only presents with worsening lung function.     3. Return in about 3 months (around 03/03/2018).   Please inform us of any Emergency Department visits, hospitalizations, or changes in symptoms. Call us before going to the ED for breathing or allergy symptoms since we might be able to fit you in for a sick visit. Feel free to contact us anytime with any questions, problems, or concerns.  It was a pleasure to see you and your family again today!  Websites that have reliable patient information: 1. American Academy of Asthma, Allergy, and Immunology: www.aaaai.org 2. Food Allergy Research and Education (FARE): foodallergy.org 3. Mothers of Asthmatics: http://www.asthmacommunitynetwork.org 4. American College of Allergy, Asthma, and Immunology: MonthlyElectricBill.co.uk   Make sure you are registered to vote!

## 2017-12-08 ENCOUNTER — Telehealth: Payer: Self-pay | Admitting: Allergy

## 2017-12-08 NOTE — Telephone Encounter (Signed)
Patient called  Has had two episodes.  Not eating MSG, additives or yeast. Patient would like to know what to do? Lost ten lbs. Since Monday 12-01-2017. Wanted to know if she can add one item back at a time since removing them did not help.

## 2017-12-08 NOTE — Telephone Encounter (Signed)
Would suggest to continue to avoid these foods for the remainder of this week. I will pass this note on the Dr. Ernst Bowler. Thank you

## 2017-12-09 NOTE — Telephone Encounter (Signed)
Nicole Mendoza emailed me to discuss further (obtained my email from her sister, who is also a patient of mine and works at [employer]). In any case, she is confused because she can eat certain yeast containing foods without a problem, but she continues to have breakouts.   I told her that the yeast was likely a false positive at this point and recommended that she put this back into her diet. I recommended that she continue to avoid preservatives and whatnot. We will refer her to see a Registered Dietician.   With the history of the marked weight loss, I think a GI referral is warranted as well. I will get some inflammatory markers in her next set of labs as a means of assessing for possible IBD.   We have not done a large lab workup for these reactions, so we could certainly consider obtaining a tryptase and alpha-gal panel.   I will also discuss with [name] to see what else she might recommend. I will let the patient know of our plans.   Salvatore Marvel, MD All[facility] [place]

## 2017-12-09 NOTE — Telephone Encounter (Signed)
Pt keeps waking up with red rash on sides, chest, hands and arms, last time was Sunday night. She will avoid the foods until further notice.

## 2017-12-12 ENCOUNTER — Telehealth: Payer: Self-pay | Admitting: Allergy & Immunology

## 2017-12-12 ENCOUNTER — Telehealth: Payer: Self-pay

## 2017-12-12 DIAGNOSIS — T7800XD Anaphylactic reaction due to unspecified food, subsequent encounter: Secondary | ICD-10-CM

## 2017-12-12 NOTE — Telephone Encounter (Signed)
I received a call from Nicole Mendoza reporting that she was continuing to have symptoms immediately after eating, despite avoiding all of the recommended foods. One instance included an anaphylaxis like event to hot dogs that were uncured cooked on a stovetop in boiling water. There were no known preservative agents in the hot dogs.   We will get some labs to look into alpha gal and idiopathic anaphylaxis. We will get a serum tryptase as well as inflammatory markers. We will also get a lupin IgE level to [employer]. Orders placed.   I recommended that Nicole Mendoza start using Zyrtec BID to see if this helps (currently she is using it once daily in the morning with worsening reactions in the evening).  Salvatore Marvel, MD Allergy and [facility] [employer] [facility] [place]

## 2017-12-12 NOTE — Telephone Encounter (Signed)
Opened in error

## 2017-12-12 NOTE — Addendum Note (Signed)
Addended by: Valentina Shaggy on: 12/12/2017 02:52 PM   Modules accepted: Orders

## 2017-12-12 NOTE — Addendum Note (Signed)
Addended by: Westley Gambles A on: 12/12/2017 03:00 PM   Modules accepted: Orders

## 2017-12-17 LAB — ALPHA-GAL PANEL
Alpha Gal IgE*: <0.1 kU/L (ref <0.10)
BEEF CLASS INTERPRETATION: 0
Class Interpretation: 0
LAMB CLASS INTERPRETATION: 0
Lamb/Mutton (Ovis spp) IgE: <0.1 kU/L (ref <0.35)

## 2017-12-17 LAB — ALLERGEN, BAKERS YEAST, F45: F045-IgE Yeast: <0.1 kU/L

## 2017-12-17 LAB — IGE: IgE (Immunoglobulin E), Serum: 12 IU/mL (ref 6–495)

## 2017-12-17 LAB — C-REACTIVE PROTEIN: CRP: 5 mg/L (ref 0–10)

## 2017-12-17 LAB — TRYPTASE: Tryptase: 5.2 ug/L (ref 2.2–13.2)

## 2017-12-17 LAB — SEDIMENTATION RATE: SED RATE: 22 mm/h (ref 0–32)

## 2017-12-18 LAB — SPECIMEN STATUS REPORT

## 2017-12-18 LAB — 5 HIAA, QUANTITATIVE, URINE, 24 HOUR: 5 HIAA UR: 2.9 mg/L

## 2017-12-28 ENCOUNTER — Other Ambulatory Visit: Payer: Self-pay | Admitting: Pediatrics

## 2017-12-28 DIAGNOSIS — J3089 Other allergic rhinitis: Secondary | ICD-10-CM

## 2017-12-28 DIAGNOSIS — J45901 Unspecified asthma with (acute) exacerbation: Secondary | ICD-10-CM

## 2017-12-30 ENCOUNTER — Encounter: Payer: Self-pay | Admitting: Allergy & Immunology

## 2017-12-31 ENCOUNTER — Telehealth: Payer: Self-pay | Admitting: *Deleted

## 2017-12-31 NOTE — Telephone Encounter (Signed)
I reached out to patient to discuss starting Xolair for her allergic reactions (urticaria) and she advised at this time she is doing well on increased dose of Zyrtec and watching her diet.  I advised her if she decides to start therapy she can contact me.

## 2018-01-27 ENCOUNTER — Ambulatory Visit: Payer: Managed Care, Other (non HMO) | Admitting: Pediatrics

## 2018-02-13 ENCOUNTER — Ambulatory Visit: Payer: Managed Care, Other (non HMO) | Admitting: Allergy & Immunology

## 2018-03-13 ENCOUNTER — Ambulatory Visit: Payer: Managed Care, Other (non HMO) | Admitting: Allergy & Immunology

## 2018-04-09 ENCOUNTER — Ambulatory Visit (INDEPENDENT_AMBULATORY_CARE_PROVIDER_SITE_OTHER): Payer: Managed Care, Other (non HMO) | Admitting: Allergy & Immunology

## 2018-04-09 ENCOUNTER — Encounter: Payer: Self-pay | Admitting: Allergy & Immunology

## 2018-04-09 VITALS — BP 160/78 | HR 120 | Temp 97.5°F

## 2018-04-09 DIAGNOSIS — K219 Gastro-esophageal reflux disease without esophagitis: Secondary | ICD-10-CM | POA: Diagnosis not present

## 2018-04-09 DIAGNOSIS — B999 Unspecified infectious disease: Secondary | ICD-10-CM

## 2018-04-09 DIAGNOSIS — J4530 Mild persistent asthma, uncomplicated: Secondary | ICD-10-CM | POA: Diagnosis not present

## 2018-04-09 DIAGNOSIS — J3089 Other allergic rhinitis: Secondary | ICD-10-CM

## 2018-04-09 DIAGNOSIS — J302 Other seasonal allergic rhinitis: Secondary | ICD-10-CM

## 2018-04-09 NOTE — Progress Notes (Signed)
FOLLOW UP  Date of Service/Encounter:  04/09/18   Assessment:   Mild persistent asthma without complication   Recurrent infections  Seasonal and perennial allergic rhinitis (trees, weeds, grasses and dust mites)  Acute sinusitis  Gastroesophageal reflux disease - on omeprazole  Adverse food reactions - improved with minimizing exposure to food additives and avoiding peanuts/tree nuts       Plan/Recommendations:   1. Seasonal and perennial allergic rhinitis (trees, weeds, grasses and dust mites) - Continue with: Zyrtec (cetirizine) 10mg  tablet once daily, Singulair (montelukast) 10mg  daily and Flonase (fluticasone) two sprays per nostril daily - You can use an extra dose of the antihistamine, if needed, for breakthrough symptoms.   2. Adverse food reaction (tree nuts, peanuts)  - Continue to avoid peanuts and tree nuts. - Continue to avoid preservatives as you are able.  - Training for epinephrine auto-injectors provided: AuviQ  3. Mild persistent asthma, uncomplicated - Spirometry looks good. - Daily controller medication(s): Asmanex 236mcg two puffs once daily during the winter months + Singulair (montelukast) 10mg  - Prior to physical activity: ProAir 2 puffs 10-15 minutes before physical activity. - Rescue medications: ProAir 4 puffs every 4-6 hours as needed - Changes during respiratory infections or worsening symptoms: Increase Asmanex 259mcg to 2 puffs twice daily for ONE TO TWO WEEKS. - Asthma control goals:  * Full participation in all desired activities (may need albuterol before activity) * Albuterol use two time or less a week on average (not counting use with activity) * Cough interfering with sleep two time or less a month * Oral steroids no more than once a year * No hospitalizations    4. Acute sinusitis - With your current symptoms and time course, antibiotics might be needed , but I would like to give the nasal saline rinses and the Mucinex some  time to work. - If symptoms are not improving in 3-4 days, feel free to call us or email me, at [email or web address] and we can send in an antibiotic at that time (we will probably do doxycycline).  - Add on thick post nasal drainage, add guaifenesin [phone] mg (Mucinex) twice daily as needed for mucous thinning with adequate hydration to help it work.   5. Recurrent infections - She has a history of recurrent infections, in particular sinusitis and ear infections. - We will obtain some screening labs to evaluate her immune system.  - Labs to evaluate the quantitative aspects of her immune system: IgG/IgA/IgM, CBC with differential - Labs to evaluate the qualitative aspects of her immune system: CH50, Pneumococcal titers, Tetanus titers, Diphtheria titers - We may consider immunizations with Pneumovax and Tdap to challenge her immune system, and then obtain repeat titers in 4-6 weeks.  - We may consider obtaining flow cytometry at future visits, if indicated.   6. Return in about 6 months (around 10/09/2018).   Subjective:   Nicole Mendoza is a 27 y.o. female presenting today for follow up of  Chief Complaint  Patient presents with  . Follow-up    Nicole Mendoza has a history of the following: Patient Active Problem List   Diagnosis Date Noted  . Mild persistent asthma without complication 10/62/6948  . Essential hypertension 04/22/2017  . Gastroesophageal reflux disease without esophagitis 12/22/2015  . Current use of beta blocker 12/22/2015  . Asthma with acute exacerbation 05/09/2015  . Acute maxillary sinusitis 05/09/2015  . Hypertension 05/09/2015  . Allergic rhinitis 05/09/2015    History obtained from: chart review and patient.  Nicole Mendoza Primary Care Provider is Nicole Section, NP-C.     [name] is a 27 y.o. female presenting for a follow up visit.  She has a rather complicated past medical history including adverse food reactions as well as allergic rhinitis  and asthma.  She was last seen in June 2019.  At that time, we did environmental allergy testing that was positive to trees, weeds, grasses, and dust mites.  We continued with Zyrtec 10 mg daily as well as Singulair 10 mg daily.  We did recommend using an extra dose of antihistamines if needed for breakthrough symptoms.  She had extensive panel of foods tested and was positive to pecan, oat, Saccharomyces, mushroom, peach, and black pepper.  She took all these out of her diet.  We also discussed the fact that these could be related to food additives.  She has tried to change her diet to avoid processed foods.  We had discussed the addition of Xolair as a means of controlling her allergic reactions.  She continued on Singulair for her asthma.  She does have Asmanex which she takes during flares.  Since the last visit, she has done well. She did reintroduce everythinhg except or peanuts and tree nuts. It is not a big part of her diet anyway. She has been eating less processed foods as well.  She has not had any reactions at all since last visit.  She did make the decision to not start Xolair since her symptoms were seemingly controlled with diet changes.   She does report that she has has had sinus pain in her midline face for the past 4-5 days. She is in the midst of her cycle. Typically she starts with a headache but the face pressure is different.  She has not had any fevers.  She has not been using nasal saline rinses at all and has not started Mucinex.  Her last course of antibiotics was 6 weeks ago for sinusitis and a left ear infection.  She received Omnicef at that time.  She does report getting sinus infections fairly frequently up to 4-5 times per year in addition to ear infections.  She has never been worked up for an immune deficiency.  Her vaccinations are up-to-date  Her allergic rhinitis is controlled with the use of fluticasone and Zyrtec. She does not feel that she needs allergy shots at this  time. Asthma has been well controlled. She is not using Asmanex on a regular basis, but she is thinking of using it during the winter months since this is the worst time of the year for her.   Otherwise, there have been no changes to her past medical history, surgical history, family history, or social history. Work is going well. 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    Review of Systems: a 14-point review of systems is pertinent for what is mentioned in HPI.  Otherwise, all other systems were negative.  Constitutional: negative other than that listed in the HPI Eyes: negative other than that listed in the HPI Ears, nose, mouth, throat, and face: negative other than that listed in the HPI Respiratory: negative other than that listed in the HPI Cardiovascular: negative other than that listed in the HPI Gastrointestinal: negative other than that listed in the HPI Genitourinary: negative other than that listed in the HPI Integument: negative other than that listed in the HPI Hematologic: negative other than that listed in the HPI Musculoskeletal: negative other than that listed  in the HPI Neurological: negative other than that listed in the HPI Allergy/Immunologic: negative other than that listed in the HPI    Objective:   Blood pressure (!) 160/78, pulse (!) 120, temperature (!) 97.5 F (36.4 C), temperature source Oral, SpO2 99 %. There is no height or weight on file to calculate BMI.   Physical Exam:  General: Alert, interactive, in no acute distress. Talkative obese female.  Eyes: No conjunctival injection bilaterally, no discharge on the right, no discharge on the left and no Horner-Trantas dots present. PERRL bilaterally. EOMI without pain. No photophobia.  Ears: Right TM pearly gray with normal light reflex, Left TM pearly gray with normal light reflex, Right TM intact without perforation and Left TM intact without perforation.  Nose/Throat: External nose within normal  limits and septum midline. Turbinates edematous and pale with crusty purulent discharge. Bilateral sinus tenderness present. Posterior oropharynx erythematous with cobblestoning in the posterior oropharynx. Tonsils 2+ without exudates.  Tongue without thrush and Geographic tongue present. Lungs: Clear to auscultation without wheezing, rhonchi or rales. No increased work of breathing. CV: Normal S1/S2. No murmurs. Capillary refill <2 seconds.  Skin: Warm and dry, without lesions or rashes. Neuro:   Grossly intact. No focal deficits appreciated. Responsive to questions.  Diagnostic studies:   Spirometry: results normal (FEV1: 3.05/84%, FVC: 3.84/89%, FEV1/FVC: 79%).    Spirometry consistent with normal pattern.   Allergy Studies: none      Salvatore Marvel, MD  [facility]

## 2018-04-09 NOTE — Patient Instructions (Addendum)
1. Seasonal and perennial allergic rhinitis (trees, weeds, grasses and dust mites) - Continue with: Zyrtec (cetirizine) 10mg  tablet once daily, Singulair (montelukast) 10mg  daily and Flonase (fluticasone) two sprays per nostril daily - You can use an extra dose of the antihistamine, if needed, for breakthrough symptoms.   2. Adverse food reaction (tree nuts, peanuts)  - Continue to avoid peanuts and tree nuts. - Continue to avoid preservatives as you are able.  - Training for epinephrine auto-injectors provided: AuviQ  3. Mild persistent asthma, uncomplicated - Spirometry looks good. - Daily controller medication(s): Asmanex 258mcg two puffs once daily during the winter months + Singulair (montelukast) 10mg  - Prior to physical activity: ProAir 2 puffs 10-15 minutes before physical activity. - Rescue medications: ProAir 4 puffs every 4-6 hours as needed - Changes during respiratory infections or worsening symptoms: Increase Asmanex 227mcg to 2 puffs twice daily for ONE TO TWO WEEKS. - Asthma control goals:  * Full participation in all desired activities (may need albuterol before activity) * Albuterol use two time or less a week on average (not counting use with activity) * Cough interfering with sleep two time or less a month * Oral steroids no more than once a year * No hospitalizations    4. Acute sinusitis - With your current symptoms and time course, antibiotics might be needed , but I would like to give the nasal saline rinses and the Mucinex some time to work. - If symptoms are not improving in 3-4 days, feel free to call us or email me, at [email or web address] and we can send in an antibiotic at that time (we will probably do doxycycline).  - Add on thick post nasal drainage, add guaifenesin [phone] mg (Mucinex) twice daily as needed for mucous thinning with adequate hydration to help it work.   5. Recurrent infections - She has a history of recurrent infections, in  particular sinusitis and ear infections. - We will obtain some screening labs to evaluate her immune system.  - Labs to evaluate the quantitative aspects of her immune system: IgG/IgA/IgM, CBC with differential - Labs to evaluate the qualitative aspects of her immune system: CH50, Pneumococcal titers, Tetanus titers, Diphtheria titers - We may consider immunizations with Pneumovax and Tdap to challenge her immune system, and then obtain repeat titers in 4-6 weeks.  - We may consider obtaining flow cytometry at future visits, if indicated.   6. Return in about 6 months (around 10/09/2018).   Please inform us of any Emergency Department visits, hospitalizations, or changes in symptoms. Call us before going to the ED for breathing or allergy symptoms since we might be able to fit you in for a sick visit. Feel free to contact us anytime with any questions, problems, or concerns.  It was a pleasure to see you and your family again today!  Websites that have reliable patient information: 1. American Academy of Asthma, Allergy, and Immunology: www.aaaai.org 2. Food Allergy Research and Education (FARE): foodallergy.org 3. Mothers of Asthmatics: http://www.asthmacommunitynetwork.org 4. American College of Allergy, Asthma, and Immunology: MonthlyElectricBill.co.uk   Make sure you are registered to vote!

## 2018-04-16 LAB — CBC WITH DIFFERENTIAL/PLATELET
BASOS ABS: 0.1 10*3/uL (ref 0.0–0.2)
Basos: 1 %
EOS (ABSOLUTE): 0.2 10*3/uL (ref 0.0–0.4)
Eos: 2 %
HEMATOCRIT: 38.6 % (ref 34.0–46.6)
HEMOGLOBIN: 12.6 g/dL (ref 11.1–15.9)
IMMATURE GRANULOCYTES: 0 %
Immature Grans (Abs): 0 10*3/uL (ref 0.0–0.1)
LYMPHS ABS: 2.5 10*3/uL (ref 0.7–3.1)
LYMPHS: 23 %
MCH: 25.8 pg — ABNORMAL LOW (ref 26.6–33.0)
MCHC: 32.6 g/dL (ref 31.5–35.7)
MCV: 79 fL (ref 79–97)
MONOCYTES: 7 %
Monocytes Absolute: 0.7 10*3/uL (ref 0.1–0.9)
NEUTROS PCT: 67 %
Neutrophils Absolute: 7.4 10*3/uL — ABNORMAL HIGH (ref 1.4–7.0)
Platelets: 356 10*3/uL (ref 150–450)
RBC: 4.88 x10E6/uL (ref 3.77–5.28)
RDW: 13.4 % (ref 12.3–15.4)
WBC: 10.9 10*3/uL — AB (ref 3.4–10.8)

## 2018-04-16 LAB — IGG, IGA, IGM
IgA/Immunoglobulin A, Serum: 171 mg/dL (ref 87–352)
IgG (Immunoglobin G), Serum: 1238 mg/dL (ref 700–1600)
IgM (Immunoglobulin M), Srm: 71 mg/dL (ref 26–217)

## 2018-04-16 LAB — COMPLEMENT, TOTAL

## 2018-04-16 LAB — STREP PNEUMONIAE 23 SEROTYPES IGG
PNEUMO AB TYPE 19 (19F): 0.5 ug/mL — AB (ref >1.3)
PNEUMO AB TYPE 1: 1.7 ug/mL (ref >1.3)
PNEUMO AB TYPE 20: 1.5 ug/mL (ref >1.3)
PNEUMO AB TYPE 2: 0.7 ug/mL — AB (ref >1.3)
PNEUMO AB TYPE 3: 0.3 ug/mL — AB (ref >1.3)
PNEUMO AB TYPE 57 (19A): 0.2 ug/mL — AB (ref >1.3)
Pneumo Ab Type 14*: 1.4 ug/mL (ref >1.3)
Pneumo Ab Type 17 (17F)*: <0.1 ug/mL — ABNORMAL LOW (ref >1.3)
Pneumo Ab Type 22 (22F)*: 0.1 ug/mL — ABNORMAL LOW (ref >1.3)
Pneumo Ab Type 23 (23F)*: <0.1 ug/mL — ABNORMAL LOW (ref >1.3)
Pneumo Ab Type 34 (10A)*: 0.5 ug/mL — ABNORMAL LOW (ref >1.3)
Pneumo Ab Type 4*: <0.1 ug/mL — ABNORMAL LOW (ref >1.3)
Pneumo Ab Type 43 (11A)*: 0.6 ug/mL — ABNORMAL LOW (ref >1.3)
Pneumo Ab Type 51 (7F)*: 0.4 ug/mL — ABNORMAL LOW (ref >1.3)
Pneumo Ab Type 54 (15B)*: <0.1 ug/mL — ABNORMAL LOW (ref >1.3)
Pneumo Ab Type 68 (9V)*: 0.2 ug/mL — ABNORMAL LOW (ref >1.3)
Pneumo Ab Type 8*: <0.1 ug/mL — ABNORMAL LOW (ref >1.3)
Pneumo Ab Type 9 (9N)*: <0.1 ug/mL — ABNORMAL LOW (ref >1.3)

## 2018-04-16 LAB — DIPHTHERIA / TETANUS ANTIBODY PANEL: TETANUS AB, IGG: 0.84 [IU]/mL (ref <0.10)

## 2018-04-30 ENCOUNTER — Other Ambulatory Visit: Payer: Self-pay | Admitting: Pediatrics

## 2018-04-30 DIAGNOSIS — J3089 Other allergic rhinitis: Secondary | ICD-10-CM

## 2018-04-30 DIAGNOSIS — J45901 Unspecified asthma with (acute) exacerbation: Secondary | ICD-10-CM

## 2018-05-12 ENCOUNTER — Ambulatory Visit (INDEPENDENT_AMBULATORY_CARE_PROVIDER_SITE_OTHER): Payer: Managed Care, Other (non HMO)

## 2018-05-12 DIAGNOSIS — Z23 Encounter for immunization: Secondary | ICD-10-CM | POA: Diagnosis not present

## 2018-05-12 MED ORDER — PNEUMOCOCCAL VAC POLYVALENT 25 MCG/0.5ML IJ INJ
0.5000 mL | INJECTION | Freq: Once | INTRAMUSCULAR | Status: AC
  Administered 2018-05-12: 0.5 mL via INTRAMUSCULAR

## 2018-05-12 MED ORDER — PNEUMOCOCCAL VAC POLYVALENT 25 MCG/0.5ML IJ INJ: 0.5000 mL | INJECTION | INTRAMUSCULAR | Status: DC

## 2018-05-12 MED ORDER — PNEUMOCOCCAL VAC POLYVALENT 25 MCG/0.5ML IJ INJ: 0.5000 mL | INJECTION | Freq: Once | INTRAMUSCULAR | Status: DC

## 2018-05-12 NOTE — Progress Notes (Signed)
Nicole Mendoza tolerated her Pneumovax without adverse event. Requisition printed off for repeat titers in 4 weeks.   Salvatore Marvel, MD [facility] [place]

## 2018-06-02 ENCOUNTER — Telehealth: Payer: Self-pay

## 2018-06-02 MED ORDER — DOXYCYCLINE HYCLATE 100 MG PO TABS: 100.0000 mg | ORAL_TABLET | Freq: Two times a day (BID) | ORAL | 0 refills | Status: DC

## 2018-06-02 NOTE — Telephone Encounter (Signed)
Per provider instruction prescription has been sent in.

## 2018-06-25 ENCOUNTER — Telehealth: Payer: Self-pay | Admitting: Allergy & Immunology

## 2018-06-25 DIAGNOSIS — J329 Chronic sinusitis, unspecified: Secondary | ICD-10-CM

## 2018-06-25 LAB — STREP PNEUMONIAE 23 SEROTYPES IGG
PNEUMO AB TYPE 17 (17F): 0.7 ug/mL — AB (ref >1.3)
PNEUMO AB TYPE 4: 1.1 ug/mL — AB (ref >1.3)
PNEUMO AB TYPE 5: 1.8 ug/mL (ref >1.3)
PNEUMO AB TYPE 8: 7.1 ug/mL (ref >1.3)
PNEUMO AB TYPE 9 (9N): 2.7 ug/mL (ref >1.3)
Pneumo Ab Type 1*: >13.6 ug/mL (ref >1.3)
Pneumo Ab Type 12 (12F)*: >29.6 ug/mL (ref >1.3)
Pneumo Ab Type 14*: >17.4 ug/mL (ref >1.3)
Pneumo Ab Type 19 (19F)*: >21.5 ug/mL (ref >1.3)
Pneumo Ab Type 2*: >25.3 ug/mL (ref >1.3)
Pneumo Ab Type 20*: >32.3 ug/mL (ref >1.3)
Pneumo Ab Type 22 (22F)*: 3 ug/mL (ref >1.3)
Pneumo Ab Type 23 (23F)*: 0.8 ug/mL — ABNORMAL LOW (ref >1.3)
Pneumo Ab Type 26 (6B)*: 0.9 ug/mL — ABNORMAL LOW (ref >1.3)
Pneumo Ab Type 3*: 1 ug/mL — ABNORMAL LOW (ref >1.3)
Pneumo Ab Type 51 (7F)*: >12.9 ug/mL (ref >1.3)
Pneumo Ab Type 56 (18C)*: 0.3 ug/mL — ABNORMAL LOW (ref >1.3)
Pneumo Ab Type 57 (19A)*: 1 ug/mL — ABNORMAL LOW (ref >1.3)
Pneumo Ab Type 68 (9V)*: >7.6 ug/mL (ref >1.3)
Pneumo Ab Type 70 (33F)*: >6.6 ug/mL (ref >1.3)

## 2018-06-25 NOTE — Telephone Encounter (Signed)
I have been emailing with Nicole Mendoza. Despite the Pneumovax, she has continued to feel fairly terrible. She has been through three courses of antibiotics for sinusitis, both from me and outside providers. She continues to have symptoms. Because of this, we are going to order a sinus CT. I anticipate that this will require a prior authorization.   Salvatore Marvel, MD [facility]

## 2018-06-26 MED ORDER — AZITHROMYCIN 250 MG PO TABS: ORAL_TABLET | ORAL | 0 refills | Status: AC

## 2018-06-26 NOTE — Telephone Encounter (Signed)
Prescription for azithromycin has been sent in.

## 2018-06-26 NOTE — Addendum Note (Signed)
Addended by: Lucrezia Starch I on: 06/26/2018 10:15 AM   Modules accepted: Orders

## 2018-06-26 NOTE — Telephone Encounter (Signed)
Working on getting CT scan approved.

## 2018-06-26 NOTE — Telephone Encounter (Signed)
Prescription has been sent in per Dr. Gillermina Hu instructions.

## 2018-07-01 NOTE — Telephone Encounter (Signed)
PA  For CT scan approved.

## 2018-07-02 NOTE — Telephone Encounter (Signed)
Patient is wanting to go to [facility] imaging center for the CT please fax the orders to [phone] for them to go ahead and schedule. Patient cx appt with [facility].   Please Advise

## 2018-07-03 NOTE — Telephone Encounter (Signed)
Patient was advise and will contact [facility] regarding this situation.

## 2018-07-03 NOTE — Telephone Encounter (Signed)
CT order was faxed to [facility] imaging center at [phone]

## 2018-07-03 NOTE — Telephone Encounter (Signed)
Patient is wanting imaging to be done at [place]. They needed authorization requiring a CT Maxillary. After contacting [facility] and speaking with representative there was already an approval for this at the [facility] and we as the doctor office cannot change the location. If patient wants to have it done at [facility] she will have to contact [facility] member services at [phone] and case ID is [id number]. Patient was contacted but stated she was at work and that either we call her back in a few hours or she will have to call the office back regarding this issue.

## 2018-07-09 ENCOUNTER — Other Ambulatory Visit: Payer: Managed Care, Other (non HMO)

## 2018-09-07 ENCOUNTER — Telehealth: Payer: Self-pay

## 2018-09-07 DIAGNOSIS — J329 Chronic sinusitis, unspecified: Secondary | ICD-10-CM

## 2018-09-07 DIAGNOSIS — B999 Unspecified infectious disease: Secondary | ICD-10-CM

## 2018-09-07 NOTE — Telephone Encounter (Addendum)
Patient is wanting imaging to be done at [facility]. Note from 06/25/2018 Per Dr. Ernst Bowler:  "order a sinus CT. I anticipate that this will require a prior authorization."   Patient states she called her insurance and has a prior authorization in place to get CT at [facility]. Sent note to Dr. Ernst Bowler to verify order and get details of Sinus CT scan requested.   (with or without contrast and full or limited sinus CT scan).  Will need to  fax to [facility] at [phone].  Patient informed.

## 2018-09-08 ENCOUNTER — Telehealth: Payer: Self-pay | Admitting: Allergy & Immunology

## 2018-09-08 NOTE — Telephone Encounter (Signed)
Note written to encourage working from home during the coronavirus pandemic. Emailed directly to the patient.    Salvatore Marvel, MD [facility] [place]

## 2018-09-08 NOTE — Telephone Encounter (Signed)
Nicole Shaggy, MD  Nicole Mendoza, CMA        Full sinus CT please without contrast.   Nicole Marvel, MD  [facility] order to [facility] at [phone].

## 2018-09-08 NOTE — Telephone Encounter (Signed)
Thanks for taking care of that, Amy! I wrote her a note to allow working from home and emailed it to her.   Salvatore Marvel, MD [facility] [place]

## 2018-10-02 ENCOUNTER — Other Ambulatory Visit: Payer: Self-pay | Admitting: Pediatrics

## 2018-10-02 DIAGNOSIS — J45901 Unspecified asthma with (acute) exacerbation: Secondary | ICD-10-CM

## 2018-10-02 DIAGNOSIS — J3089 Other allergic rhinitis: Secondary | ICD-10-CM

## 2018-10-29 ENCOUNTER — Encounter: Payer: Self-pay | Admitting: Allergy & Immunology

## 2018-10-29 ENCOUNTER — Other Ambulatory Visit: Payer: Self-pay

## 2018-10-29 ENCOUNTER — Ambulatory Visit (INDEPENDENT_AMBULATORY_CARE_PROVIDER_SITE_OTHER): Payer: Managed Care, Other (non HMO) | Admitting: Allergy & Immunology

## 2018-10-29 DIAGNOSIS — R0683 Snoring: Secondary | ICD-10-CM

## 2018-10-29 DIAGNOSIS — J4530 Mild persistent asthma, uncomplicated: Secondary | ICD-10-CM

## 2018-10-29 DIAGNOSIS — T7800XD Anaphylactic reaction due to unspecified food, subsequent encounter: Secondary | ICD-10-CM | POA: Diagnosis not present

## 2018-10-29 DIAGNOSIS — J302 Other seasonal allergic rhinitis: Secondary | ICD-10-CM

## 2018-10-29 DIAGNOSIS — J3089 Other allergic rhinitis: Secondary | ICD-10-CM | POA: Diagnosis not present

## 2018-10-29 DIAGNOSIS — B999 Unspecified infectious disease: Secondary | ICD-10-CM | POA: Diagnosis not present

## 2018-10-29 DIAGNOSIS — Z72820 Sleep deprivation: Secondary | ICD-10-CM

## 2018-10-29 MED ORDER — LEVALBUTEROL TARTRATE 45 MCG/ACT IN AERO: 2.0000 | INHALATION_SPRAY | Freq: Four times a day (QID) | RESPIRATORY_TRACT | 1 refills | Status: DC | PRN

## 2018-10-29 NOTE — Patient Instructions (Addendum)
1. Seasonal and perennial allergic rhinitis (trees, weeds, grasses and dust mites) - Continue with: Zyrtec (cetirizine) 10mg  tablet once daily, Singulair (montelukast) 10mg  daily and Flonase (fluticasone) two sprays per nostril daily - You can use an extra dose of the antihistamine, if needed, for breakthrough symptoms.   2. Adverse food reaction (tree nuts, peanuts)  - Continue to avoid peanuts and tree nuts. - Continue to avoid preservatives as you are able.  - Training for epinephrine auto-injectors provided: AuviQ  3. Mild persistent asthma, uncomplicated - We did do lung testing today. - We will order a sleep study due to the fatigue.  - Daily controller medication(s): Asmanex 267mcg two puffs once daily + Singulair (montelukast) 10mg  - Prior to physical activity: ProAir 2 puffs 10-15 minutes before physical activity. - Rescue medications: ProAir 4 puffs every 4-6 hours as needed - Changes during respiratory infections or worsening symptoms: Increase Asmanex 244mcg to 2 puffs twice daily for ONE TO TWO WEEKS. - Asthma control goals:  * Full participation in all desired activities (may need albuterol before activity) * Albuterol use two time or less a week on average (not counting use with activity) * Cough interfering with sleep two time or less a month * Oral steroids no more than once a year * No hospitalizations    4.Return in about 4 months (around 03/01/2019).   Please inform us of any Emergency Department visits, hospitalizations, or changes in symptoms. Call us before going to the ED for breathing or allergy symptoms since we might be able to fit you in for a sick visit. Feel free to contact us anytime with any questions, problems, or concerns.  It was a pleasure to talk to you today!  Websites that have reliable patient information: 1. American Academy of Asthma, Allergy, and Immunology: www.aaaai.org 2. Food Allergy Research and Education (FARE): foodallergy.org 3.  Mothers of Asthmatics: http://www.asthmacommunitynetwork.org 4. American College of Allergy, Asthma, and Immunology: MonthlyElectricBill.co.uk   Make sure you are registered to vote!

## 2018-10-29 NOTE — Progress Notes (Signed)
RE: Nicole Mendoza MRN: [id number] DOB: 01-Sep-1990 Date of Telemedicine Visit: 10/29/2018  Referring provider: Oren Section, NP-C Primary care provider: Oren Section, NP-C  Chief Complaint: Asthma   Telemedicine Follow Up Visit via Telephone: I connected with Nicole Mendoza for a follow up on 10/29/18 by telephone and verified that I am speaking with the correct person using two identifiers.   I discussed the limitations, risks, security and privacy concerns of performing an evaluation and management service by telephone and the availability of in person appointments. I also discussed with the patient that there may be a patient responsible charge related to this service. The patient expressed understanding and agreed to proceed.  Patient is at home.  Provider is at the office.  Visit start time: 4:07 PM Visit end time: 4:32 PM Insurance consent/check in by: Nicole Mendoza consent and medical assistant/nurse: Nicole Mendoza  History of Present Illness:  She is a 28 y.o. female, who is being followed for a multitude of atopic complaints. Her previous allergy office visit was in October 2019 with Dr. Ernst Mendoza.  At her last visit she was continued on cetirizine 10 mg daily as well as Singulair and Flonase.  For her adverse food reactions, we recommended continued avoidance of peanuts and tree nuts.  She also has some issues with preservative so we recommended avoiding these.  She did have an up-to-date Auvi-Q.  For her asthma, her spirometry looked normal.  We continued Asmanex 220 mcg 2 puffs once daily.  The winter and Singulair 10 mg daily.  Since the last visit, she has done fairly well. She has not been on antibiotics since the beginning of March due to a throat infection. This is the longest that she has gone without being on some kind of antibiotic.   Asthma/Respiratory Symptom History: She did have a day of bad breathing last week and needed to use her nebilizer treatment. She  thinks that it might have been related to the use mask she was using. She took her rescue inhaler at work and then used a nebulizer treatment at night. This was a different mask brand. She is not using her Asmanex religiously; she estimates that it is two times weekly. She has not had prednisone at all this year. She remains on her Singulair as well. Her Asmanex is $10 for refills.   Allergic Rhinitis Symptom History: She is using an antihistamine daily.  She is also on Singulair.  She is not very good about using her Flonase, but she will pick it up when she starts to have increased congestion.  Last time she needed antibiotics was in late February or early March prior to that the last time she needed them was when I saw her late in 2019.  She thinks that her frequency of infections has improved.  She does think that the Pneumovax has helped with this.  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  Food Allergy Symptom History: She has not eaten kind of nuts at all. She is avoiding all preservatives. She thinks that she might need a new prescription for Auvi-Q.  She has been eating far fewer processed foods, which has helped decrease the frequency of her reactions.  Despite this, she has not lost any weight.  But on the right side, she has not gained weight either.   She does report  that she has chronic fatigue.  She wakes up feeling just as tired as when she went to bed.  She gets home around 315 and has to take a 45-minute nap, and it takes her husband a while to wake her up for dinner.  She has never undergone a sleep study.  Her husband does report that she snores at night.  Otherwise, there have been no changes to her past medical history, surgical history, family history, or social history.  Assessment and Plan:  Nicole Mendoza is a 28 y.o. female with:  Mild persistent asthma without complication    Recurrent infections - improved following Pneumovax (protection to 3/23 serotypes initially to 16/23 serotypes post-vaccination of Pneumococcus)  Seasonal and perennial allergic rhinitis (trees, weeds, grasses and dust mites)  Gastroesophageal reflux disease - on omeprazole  Adverse food reactions - improved with minimizing exposure to food additives and avoiding peanuts/tree nuts  Snoring with perceived poor sleep quality   Nicole Mendoza presents for a follow-up visit.  She actually seems to be doing rather well on her current regimen.  Her breathing is under good control, especially when she remembers to take her Asmanex.  Thankfully, she has not needed any steroids since I saw her in October.  Her rhinitis is well controlled with an antihistamine daily and Singulair as well as Flonase daily.  Her infectious history has taken a dramatic turn for the better, with only 1 course of antibiotics since I saw her 7 months ago.  She has been able to control her food allergies with strict avoidance of processed foods, which tend to contain the highest amounts of preservatives.  Her Auvi-Q epinephrine autoinjector is out of date, so we will send in a new one.  I am very happy with how she is doing from an atopic perspective.  She is having marked fatigue and review of systems is positive for snoring as well as poor sleep.  Therefore, we will refer her for a sleep study.  I think having a CPAP will improve her sleep quality, which will in turn improve her energy levels and allow her to participate in more physical activity and lose weight.  1. Seasonal and perennial allergic rhinitis (trees, weeds, grasses and dust mites) - Continue with: Zyrtec (cetirizine) 10mg  tablet once daily, Singulair (montelukast) 10mg  daily and Flonase (fluticasone) two sprays per nostril daily - You can use an extra dose of the antihistamine, if needed, for breakthrough symptoms.   2. Adverse food reaction (tree nuts, peanuts)  -  Continue to avoid peanuts and tree nuts. - Continue to avoid preservatives as you are able.  - Training for epinephrine auto-injectors provided: AuviQ  3. Mild persistent asthma, uncomplicated - We did do lung testing today. - We will order a sleep study due to the fatigue.  - Daily controller medication(s): Asmanex 266mcg two puffs once daily + Singulair (montelukast) 10mg  - Prior to physical activity: ProAir 2 puffs 10-15 minutes before physical activity. - Rescue medications: ProAir 4 puffs every 4-6 hours as needed - Changes during respiratory infections or worsening symptoms: Increase Asmanex 262mcg to 2 puffs twice daily for ONE TO TWO WEEKS. - Asthma control goals:  * Full participation in all desired activities (may need albuterol before activity) * Albuterol use two time or less a week on average (not counting use with activity) * Cough interfering with sleep two time or less a month * Oral steroids no more than once a year * No hospitalizations    4. Return  in about 4 months (around 03/01/2019)     Diagnostics: None.  Medication List:  Current Outpatient Medications  Medication Sig Dispense Refill  . cetirizine (ZYRTEC) 10 MG tablet Take by mouth.    . cloNIDine (CATAPRES) 0.1 MG tablet TAKE 1 TABLET BY MOUTH 2 TIMES DAILY AS NEEDED.    Marland Kitchen EPINEPHrine (AUVI-Q) 0.3 mg/0.3 mL IJ SOAJ injection Use as directed for severe allergic reactions 2 Device 1  . fluticasone (FLONASE) 50 MCG/ACT nasal spray Place 2 sprays into both nostrils daily. 48 g 2  . hydrochlorothiazide (HYDRODIURIL) 25 MG tablet Take 25 mg by mouth.    . levalbuterol (XOPENEX HFA) 45 MCG/ACT inhaler Inhale 2 puffs into the lungs every 6 (six) hours as needed for wheezing. 1 Inhaler 1  . levalbuterol (XOPENEX) 1.25 MG/3ML nebulizer solution USE 1 VIAL BY NEBULIZATION EVERY 6 (SIX) HOURS AS NEEDED FOR WHEEZING.    . metoprolol succinate (TOPROL-XL) 100 MG 24 hr tablet Take 100 mg by mouth daily. Take with or  immediately following a meal.    . mometasone (ASMANEX) 220 MCG/INH inhaler Inhale 2 puffs 2 (two) times daily as needed into the lungs.    . montelukast (SINGULAIR) 10 MG tablet TAKE 1 TABLET BY MOUTH EVERY DAY 30 tablet 2  . norethindrone (ORTHO MICRONOR) 0.35 MG tablet Take 0.35 mg by mouth.    . ondansetron (ZOFRAN) 4 MG tablet Take 4 mg by mouth every 8 (eight) hours as needed for nausea or vomiting.    . pantoprazole (PROTONIX) 40 MG tablet Take 40 mg by mouth daily.  3   No current facility-administered medications for this visit.    Allergies: Allergies  Allergen Reactions  . Budesonide-Formoterol Fumarate Other (See Comments)    Causes Hypertension  . Penicillins Hives  . Albuterol Palpitations  . Losartan Rash   I reviewed her past medical history, social history, family history, and environmental history and no significant changes have been reported from previous visits.  Review of Systems  Constitutional: Positive for fatigue. Negative for activity change, appetite change, chills and fever.  HENT: Negative for congestion, postnasal drip, rhinorrhea, sinus pressure, sinus pain and sore throat.   Eyes: Negative for pain, discharge, redness and itching.  Respiratory: Positive for cough. Negative for shortness of breath, wheezing and stridor.        Positive for snoring.  Gastrointestinal: Negative for diarrhea, nausea and vomiting.  Musculoskeletal: Negative for arthralgias, joint swelling and myalgias.  Skin: Negative for rash.  Allergic/Immunologic: Negative for environmental allergies and food allergies.    Objective:  Physical exam not obtained as encounter was done via telephone.   Previous notes and tests were reviewed.  I discussed the assessment and treatment plan with the patient. The patient was provided an opportunity to ask questions and all were answered. The patient agreed with the plan and demonstrated an understanding of the instructions.   The patient  was advised to call back or seek an in-person evaluation if the symptoms worsen or if the condition fails to improve as anticipated.  I provided 25 minutes of non-face-to-face time during this encounter.  It was my pleasure to participate in [facility] care today. Please feel free to contact me with any questions or concerns.   Sincerely,  Valentina Shaggy, MD

## 2018-10-30 ENCOUNTER — Telehealth: Payer: Self-pay | Admitting: *Deleted

## 2018-10-30 NOTE — Telephone Encounter (Signed)
Ordered placed. Will follow up with sleep center.

## 2018-10-30 NOTE — Addendum Note (Signed)
Addended by: Horris Latino on: 10/30/2018 08:57 AM   Modules accepted: Orders

## 2018-10-30 NOTE — Telephone Encounter (Signed)
-----   Message from Valentina Shaggy, MD sent at 10/29/2018  6:09 PM EDT ----- Please schedule for sleep study! [facility]!

## 2018-11-02 NOTE — Telephone Encounter (Signed)
Patient informed order was placed and sleep center will be calling to schedule. Spoke with sleep center and they have referral and will call patient.

## 2018-11-17 NOTE — Telephone Encounter (Signed)
Patient called to follow up on her sleep study referral. I called [facility] and left 2 voicemail's. I spoke with Dr Ernst Bowler and he agreed to place a referral one more place to see who can get her in first. Patient has been informed of this change. I will follow up with her referral in a few days.

## 2018-11-17 NOTE — Telephone Encounter (Signed)
Awesome! Thanks much!  ° °Joel Gallagher, MD °[facility] ° °

## 2018-11-23 ENCOUNTER — Telehealth: Payer: Self-pay | Admitting: Neurology

## 2018-11-23 NOTE — Telephone Encounter (Signed)

## 2018-11-25 NOTE — Telephone Encounter (Signed)
Patient is schedule for [facility] on 12/01/2018

## 2018-11-25 NOTE — Telephone Encounter (Signed)
Awesome. You da best.  Salvatore Marvel, MD [facility]

## 2018-11-30 NOTE — Telephone Encounter (Signed)
I called pt. Pt's meds, allergies, and PMH were updated.  Pt reports that she has never had a sleep study. Pt does endorse snoring.  Pt's recent weight is 290 lbs and she is 5'8.  Pt was instructed on how to measure her neck size prior to her appt.  Epworth Sleepiness Scale 0= would never doze 1= slight chance of dozing 2= moderate chance of dozing 3= high chance of dozing  Sitting and reading: 3 Watching TV: 2 Sitting inactive in a public place (ex. Theater or meeting): 1 As a passenger in a car for an hour without a break: 3 Lying down to rest in the afternoon: 3 Sitting and talking to someone: 0 Sitting quietly after lunch (no alcohol): 1 In a car, while stopped in traffic: 0 Total: 13  FSS: 49

## 2018-12-01 ENCOUNTER — Encounter: Payer: Self-pay | Admitting: Neurology

## 2018-12-01 ENCOUNTER — Other Ambulatory Visit: Payer: Self-pay

## 2018-12-01 ENCOUNTER — Ambulatory Visit (INDEPENDENT_AMBULATORY_CARE_PROVIDER_SITE_OTHER): Payer: Managed Care, Other (non HMO) | Admitting: Neurology

## 2018-12-01 DIAGNOSIS — Z9189 Other specified personal risk factors, not elsewhere classified: Secondary | ICD-10-CM

## 2018-12-01 DIAGNOSIS — J4520 Mild intermittent asthma, uncomplicated: Secondary | ICD-10-CM

## 2018-12-01 DIAGNOSIS — J3089 Other allergic rhinitis: Secondary | ICD-10-CM

## 2018-12-01 DIAGNOSIS — J988 Other specified respiratory disorders: Secondary | ICD-10-CM

## 2018-12-01 DIAGNOSIS — Z820 Family history of epilepsy and other diseases of the nervous system: Secondary | ICD-10-CM | POA: Diagnosis not present

## 2018-12-01 DIAGNOSIS — R0683 Snoring: Secondary | ICD-10-CM | POA: Diagnosis not present

## 2018-12-01 DIAGNOSIS — G4719 Other hypersomnia: Secondary | ICD-10-CM | POA: Diagnosis not present

## 2018-12-01 DIAGNOSIS — Z6841 Body Mass Index (BMI) 40.0 and over, adult: Secondary | ICD-10-CM

## 2018-12-01 DIAGNOSIS — R0681 Apnea, not elsewhere classified: Secondary | ICD-10-CM

## 2018-12-01 NOTE — Patient Instructions (Signed)
Given verbally, during today's virtual video-based encounter, with verbal feedback received.   

## 2018-12-01 NOTE — Progress Notes (Signed)
Star Age, MD, PhD [facility] [address] [place], [place] [address]   Virtual Visit via Video Note on 12/01/2018:  I connected with Nicole Mendoza on 12/01/18 at  4:00 PM EDT by a video enabled telemedicine application and verified that I am speaking with the correct person using two identifiers.   I discussed the limitations of evaluation and management by telemedicine and the availability of in person appointments. The patient expressed understanding and agreed to proceed.  History of Present Illness: Nicole Mendoza is a 28 year old right-handed woman with an underlying medical history of allergic rhinitis, asthma, reflux disease, hypertension, and morbid obesity with a BMI of over 40, who presents for a virtual, video based appointment via [email or web address] for evaluation of her sleep disorder, in particular, concern for underlying obstructive sleep apnea.  The patient is unaccompanied today and joins via laptop from home, I am located in my office.  She is referred by her allergy specialist, Dr. Ernst Bowler and I reviewed his virtual visit office note from 10/29/2018.   She reports snoring and excessive daytime somnolence.  She does not typically wake up rested.  Symptoms have been ongoing for at least 2 years.  She has gained weight in the past 1 or 2 years.  She is familiar with a diagnosis of sleep apnea as she is an Therapist, sports and her father has sleep apnea and uses a CPAP machine.  She also reports that her paternal grandmother passed away from complications of pulmonary hypertension and untreated sleep apnea.  She would be willing to consider CPAP therapy herself so long as she can tolerate a smaller interface such as a nasal interface.  She would be worried about using a fullface mask.  Her husband has reported that she snores and once or so has noticed a pause in her breathing.  She has woken herself up rarely with a sense of gasping for air or a dream of not  being able to breathe.  She typically has a bedtime of around 1030, rise time has to be 515 or 8:30 AM depending on her job.  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  She is working towards streamlining her work schedule.  She does not drink caffeine daily, she is a non-smoker and does not utilize alcohol.  She lives with her husband and they have a small dog in the household.  She has a TV in the bedroom but does not watch it at night.  She does not have night to night nocturia or recurrent morning headaches.  Her Epworth sleepiness score is 13 out of 24, fatigue severity score is 49 out of 63.  In the past, she was diagnosed with vitamin D deficiency and has tried prescription vitamin D but it made her too dizzy and she resorted to taking over-the-counter vitamin D. She has an upcoming appointment with her primary care physician.   Her Past Medical History Is Significant For: Past Medical History:  Diagnosis Date   Allergic rhinitis    Angio-edema    Asthma    Fatty liver    Recurrent upper respiratory infection (URI)     Her Past Surgical History Is Significant For: No past surgical history on file.  Her Family History Is Significant For: Family History  Problem Relation Age of Onset   Allergic rhinitis Father    Asthma Father    Allergic rhinitis Sister  Asthma Sister    Angioedema Neg Hx    Atopy Neg Hx    Eczema Neg Hx    Immunodeficiency Neg Hx    Urticaria Neg Hx     Her Social History Is Significant For: Social History   Socioeconomic History   Marital status: Single    Spouse name: Not on file   Number of children: Not on file   Years of education: Not on file   Highest education level: Not on file  Occupational History   Not on file  Social Needs   Financial resource strain: Not on file   Food insecurity    Worry: Not on file    Inability: Not on file   Transportation needs    Medical: Not on  file    Non-medical: Not on file  Tobacco Use   Smoking status: Never Smoker   Smokeless tobacco: Never Used  Substance and Sexual Activity   Alcohol use: No    Alcohol/week: 0.0 standard drinks   Drug use: No   Sexual activity: Not on file  Lifestyle   Physical activity    Days per week: Not on file    Minutes per session: Not on file   Stress: Not on file  Relationships   Social connections    Talks on phone: Not on file    Gets together: Not on file    Attends religious service: Not on file    Active member of club or organization: Not on file    Attends meetings of clubs or organizations: Not on file    Relationship status: Not on file  Other Topics Concern   Not on file  Social History Narrative   Not on file    Her Allergies Are:  Allergies  Allergen Reactions   Budesonide-Formoterol Fumarate Other (See Comments)    Causes Hypertension   Penicillins Hives   Albuterol Palpitations   Losartan Rash  :   Her Current Medications Are:  Outpatient Encounter Medications as of 12/01/2018  Medication Sig   cetirizine (ZYRTEC) 10 MG tablet Take by mouth.   cloNIDine (CATAPRES) 0.1 MG tablet TAKE 1 TABLET BY MOUTH 2 TIMES DAILY AS NEEDED.   EPINEPHrine (AUVI-Q) 0.3 mg/0.3 mL IJ SOAJ injection Use as directed for severe allergic reactions   fluticasone (FLONASE) 50 MCG/ACT nasal spray Place 2 sprays into both nostrils daily.   hydrochlorothiazide (HYDRODIURIL) 25 MG tablet Take 25 mg by mouth.   levalbuterol (XOPENEX HFA) 45 MCG/ACT inhaler Inhale 2 puffs into the lungs every 6 (six) hours as needed for wheezing.   levalbuterol (XOPENEX) 1.25 MG/3ML nebulizer solution USE 1 VIAL BY NEBULIZATION EVERY 6 (SIX) HOURS AS NEEDED FOR WHEEZING.   metoprolol succinate (TOPROL-XL) 100 MG 24 hr tablet Take 100 mg by mouth daily. Take with or immediately following a meal.   mometasone (ASMANEX) 220 MCG/INH inhaler Inhale 2 puffs 2 (two) times daily as needed into  the lungs.   montelukast (SINGULAIR) 10 MG tablet TAKE 1 TABLET BY MOUTH EVERY DAY   norethindrone (ORTHO MICRONOR) 0.35 MG tablet Take 0.35 mg by mouth.   ondansetron (ZOFRAN) 4 MG tablet Take 4 mg by mouth every 8 (eight) hours as needed for nausea or vomiting.   pantoprazole (PROTONIX) 40 MG tablet Take 40 mg by mouth daily.   No facility-administered encounter medications on file as of 12/01/2018.   :   Review of Systems:  Out of a complete 14 point review of systems, all are  reviewed and negative with the exception of these symptoms as listed below:  Observations/Objective:   Assessment and Plan: Nicole Mendoza is a 28 year old right-handed woman with an underlying medical history of allergic rhinitis, asthma, reflux disease, hypertension, and morbid obesity with a BMI of over 40, with whom I am conducting a virtual, video based new patient visit via doxy.me in lieu of a face-to-face visit for evaluation of an underlying organic sleep disorder, in particular, concern for obstructive sleep apnea. The patient's medical history and physical exam (albeit limited with current video-based evaluation) are concerning for a diagnosis of obstructive sleep apnea. I discussed with the patient the diagnosis of OSA, its prognosis and treatment options. I explained in particular the risks and ramifications of untreated moderate to severe OSA, especially with respect to developing cardiovascular disease down the Road, including congestive heart failure, difficult to treat hypertension, cardiac arrhythmias, or stroke. Even type 2 diabetes has, in part, been linked to untreated OSA. Symptoms of untreated OSA may include daytime sleepiness, memory problems, mood irritability and mood disorder such as depression and anxiety, lack of energy, as well as recurrent headaches, especially morning headaches. We talked about the importance of weight control. We talked about the importance of maintaining good sleep  hygiene. I recommended the following at this time: sleep study.  I explained the sleep test procedure to the patient and also outlined possible treatment options of OSA, including the use of a custom-made dental device (which would require a referral to a specialist dentist), and the use of CPAP. She would be willing to try CPAP if the need arises. I answered all her questions today and the patient was in agreement. I plan to see the patient back after the sleep study is completed and encouraged her to call with any interim questions, concerns, problems or updates. She is advised to talk to her primary care physician during the upcoming appointment about retesting her vitamin D level and perhaps also adding a vitamin B12.  Star Age, MD, PhD   Follow Up Instructions:    I discussed the assessment and treatment plan with the patient. The patient was provided an opportunity to ask questions and all were answered. The patient agreed with the plan and demonstrated an understanding of the instructions.   The patient was advised to call back or seek an in-person evaluation if the symptoms worsen or if the condition fails to improve as anticipated.  I provided 30 minutes of non-face-to-face time during this encounter.   Star Age, MD

## 2018-12-14 ENCOUNTER — Other Ambulatory Visit: Payer: Self-pay | Admitting: *Deleted

## 2018-12-14 ENCOUNTER — Other Ambulatory Visit: Payer: Self-pay | Admitting: Allergy & Immunology

## 2018-12-14 MED ORDER — MOMETASONE FUROATE 220 MCG/INH IN AEPB: 2.0000 | INHALATION_SPRAY | Freq: Two times a day (BID) | RESPIRATORY_TRACT | 2 refills | Status: DC | PRN

## 2018-12-14 NOTE — Telephone Encounter (Signed)
Samples for patient found in [facility] they will place up front for her to pick up

## 2018-12-14 NOTE — Telephone Encounter (Signed)
Dr Ernst Bowler please advise Asmanex is not covered under insurance Pulmicort is preferred

## 2018-12-15 ENCOUNTER — Ambulatory Visit (INDEPENDENT_AMBULATORY_CARE_PROVIDER_SITE_OTHER): Payer: Managed Care, Other (non HMO) | Admitting: Allergy & Immunology

## 2018-12-15 ENCOUNTER — Encounter: Payer: Self-pay | Admitting: Allergy & Immunology

## 2018-12-15 ENCOUNTER — Other Ambulatory Visit: Payer: Self-pay

## 2018-12-15 DIAGNOSIS — T781XXD Other adverse food reactions, not elsewhere classified, subsequent encounter: Secondary | ICD-10-CM | POA: Diagnosis not present

## 2018-12-15 DIAGNOSIS — J3089 Other allergic rhinitis: Secondary | ICD-10-CM

## 2018-12-15 DIAGNOSIS — U071 COVID-19: Secondary | ICD-10-CM | POA: Diagnosis not present

## 2018-12-15 DIAGNOSIS — J4530 Mild persistent asthma, uncomplicated: Secondary | ICD-10-CM | POA: Diagnosis not present

## 2018-12-15 DIAGNOSIS — J988 Other specified respiratory disorders: Secondary | ICD-10-CM

## 2018-12-15 DIAGNOSIS — T7819XD Other adverse food reactions, not elsewhere classified, subsequent encounter: Secondary | ICD-10-CM

## 2018-12-15 DIAGNOSIS — J302 Other seasonal allergic rhinitis: Secondary | ICD-10-CM

## 2018-12-15 MED ORDER — DEXAMETHASONE 4 MG PO TABS: 12.0000 mg | ORAL_TABLET | Freq: Once | ORAL | 0 refills | Status: AC

## 2018-12-15 NOTE — Progress Notes (Signed)
RE: Nicole Mendoza MRN: [id number] DOB: 15-Feb-1991 Date of Telemedicine Visit: 12/15/2018  Referring provider: Oren Section, NP-C Primary care provider: Oren Section, NP-C  Chief Complaint: Asthma and Covid 19 (diagnosed over the weekend, breathing getting worse)   Telemedicine Follow Up Visit via Telephone: I connected with Nicole Mendoza for a follow up on 12/15/18 by telephone and verified that I am speaking with the correct person using two identifiers.   I discussed the limitations, risks, security and privacy concerns of performing an evaluation and management service by telephone and the availability of in person appointments. I also discussed with the patient that there may be a patient responsible charge related to this service. The patient expressed understanding and agreed to proceed.  Patient is at home accompanied by her husband who provided/contributed to the history.  Provider is at the office.  Visit start time: 8:46 AM Visit end time: 9:00 AM Insurance consent/check in by: Lelan Pons Medical consent and medical assistant/nurse: [name]  History of Present Illness:  She is a 28 y.o. female, who is being followed for persistent asthma, recurrent infections, seasonal and perennial allergic rhinitis, and adverse food reactions. Her previous allergy office visit was in May 2020 with Dr. Ernst Bowler.  At that visit, her symptoms were well controlled.  For her rhinitis, we continued with Zyrtec 10 mg daily, Singulair 10 mg daily, and Flonase 2 sprays per nostril daily.  For her history of adverse reactions, we recommended continued avoidance of tree nuts and peanuts.  We have also found that preservatives seem to be a trigger for her as well and we recommended continued avoidance.  For her breathing, we continued with Asmanex 220 mcg 2 puffs once daily and Singulair 10 mg daily.  She does increase her Asmanex to 2 puffs twice daily during flares.  In the interim, she did contact me on  Friday when I was out of the office with sinus congestion and postnasal drip.  Unfortunately, I did not get the message since I did not check my email.  Over the weekend, she called our on-call physician who recommended doubling up her Asmanex.  She did do that but then felt no better.   Over the weekend, her husband went to urgent care.  He was actually sick before she was and she blames him for getting her sick.  Regardless, he was diagnosed with a sinus infection and started on doxycycline.  Despite saying he lost his sense of smell and taste, he was not tested for COVID.  Her husband seems to have gotten better with the start of the doxycycline.  Over the weekend, she continued to not feel better so she went to a respiratory clinic at [facility].  She ended up getting swabbed there and she was notified this morning that this did come back positive for [place].  She is now quite concerned, given her history of asthma, and would like to discuss this further.  She has had no fevers during this.  She has been using her Xopenex with some improvement, although it is transient.  She did ask whether prednisone was appropriate at her respiratory visit, but was told that it was contraindicated and COVID19.  She has never been on a combined inhaled steroid with a long-acting albuterol for her asthma.  This is because she tends to have either thrush from the steroids or tachycardia from the long-acting albuterol.  She is wondering whether she should be using her nebulizer at all, given  the propensity to aerosolized COVID-19 droplets.  She has not gotten a pulse ox but someone is dropping 1 offer her today.  She does feel more short of breath today, although she does have a history of anxiety and knows that she can be somewhat alarmist at times.  Otherwise, there have been no changes to her past medical history, surgical history, family history, or social history.  Assessment and Plan:  Nicole Mendoza is a 28  y.o. female with:  Mild persistent asthma - complicated now by a positive COVID-19 test  Recurrent infections - improved following Pneumovax (protection to 3/23 serotypes initially to 16/23 serotypes post-vaccination of Pneumococcus)  Seasonal and perennial allergic rhinitis(trees, weeds, grasses and dust mites)  Gastroesophageal reflux disease- on omeprazole  Adverse food reactions - improved with minimizing exposure to food additives and avoiding peanuts/tree nuts  Snoring with perceived poor sleep quality    1. Seasonal and perennial allergic rhinitis (trees, weeds, grasses and dust mites) - Continue with: Zyrtec (cetirizine) 10mg  tablet once daily, Singulair (montelukast) 10mg  daily and Flonase (fluticasone) two sprays per nostril daily - You can use an extra dose of the antihistamine, if needed, for breakthrough symptoms.   2. Adverse food reaction (tree nuts, peanuts)  - Continue to avoid peanuts and tree nuts. - Continue to avoid preservatives as you are able.  - Training for epinephrine auto-injectors provided: AuviQ  3. Mild persistent asthma - with COVID19 positive swab - Although steroids are contraindicated in hospitalized patients, the studies have looked at the use of prednisone and methylprednisolone. - There was a study that showed improvement with dexamethasone use with COVID-19 patients, so I would like to go ahead and start you on this medication. - Prescription sent in for dexamethasone 12 mg to take once and then repeat in 3 days. - The max dose is 16 mg, but since you are sensitive to medications I did go on the lower end. - We did consider adding on Pulmicort in lieu of Asmanex, but being on a systemic steroid I do not think that the inhaled steroid will add much in terms of a clinical improvement. - Therefore we are not can make any changes at this time. - I want daily updates from you, either email or phone. - Daily controller medication(s): Asmanex  262mcg two puffs once daily + Singulair (montelukast) 10mg  - Prior to physical activity: ProAir 2 puffs 10-15 minutes before physical activity. - Rescue medications: ProAir 4 puffs every 4-6 hours as needed - Changes during respiratory infections or worsening symptoms: Increase Asmanex 246mcg to 2 puffs twice daily for ONE TO TWO WEEKS. - Asthma control goals:  * Full participation in all desired activities (may need albuterol before activity) * Albuterol use two time or less a week on average (not counting use with activity) * Cough interfering with sleep two time or less a month * Oral steroids no more than once a year * No hospitalizations  4. Return in about 3 months (around 03/17/2019). This can be an in-person, a virtual Webex or a telephone follow up visit.    Diagnostics: None.  Medication List:  Current Outpatient Medications  Medication Sig Dispense Refill  . cetirizine (ZYRTEC) 10 MG tablet Take by mouth.    . cloNIDine (CATAPRES) 0.1 MG tablet TAKE 1 TABLET BY MOUTH 2 TIMES DAILY AS NEEDED.    Marland Kitchen dexamethasone (DECADRON) 4 MG tablet Take 3 tablets (12 mg total) by mouth once for 1 dose. Repeat in three days. 6 tablet  0  . EPINEPHrine (AUVI-Q) 0.3 mg/0.3 mL IJ SOAJ injection Use as directed for severe allergic reactions 2 Device 1  . fluticasone (FLONASE) 50 MCG/ACT nasal spray Place 2 sprays into both nostrils daily. 48 g 2  . hydrochlorothiazide (HYDRODIURIL) 25 MG tablet Take 25 mg by mouth.    . levalbuterol (XOPENEX HFA) 45 MCG/ACT inhaler Inhale 2 puffs into the lungs every 6 (six) hours as needed for wheezing. 1 Inhaler 1  . levalbuterol (XOPENEX) 1.25 MG/3ML nebulizer solution USE 1 VIAL BY NEBULIZATION EVERY 6 (SIX) HOURS AS NEEDED FOR WHEEZING.    . metoprolol succinate (TOPROL-XL) 100 MG 24 hr tablet Take 100 mg by mouth daily. Take with or immediately following a meal.    . montelukast (SINGULAIR) 10 MG tablet TAKE 1 TABLET BY MOUTH EVERY DAY 30 tablet 2  .  norethindrone (ORTHO MICRONOR) 0.35 MG tablet Take 0.35 mg by mouth.    . ondansetron (ZOFRAN) 4 MG tablet Take 4 mg by mouth every 8 (eight) hours as needed for nausea or vomiting.    . pantoprazole (PROTONIX) 40 MG tablet Take 40 mg by mouth daily.  3   No current facility-administered medications for this visit.    Allergies: Allergies  Allergen Reactions  . Budesonide-Formoterol Fumarate Other (See Comments)    Causes Hypertension  . Penicillins Hives  . Albuterol Palpitations  . Losartan Rash   I reviewed her past medical history, social history, family history, and environmental history and no significant changes have been reported from previous visits.  Review of Systems  Constitutional: Negative for activity change, appetite change, chills and fever.  HENT: Positive for rhinorrhea and sinus pressure. Negative for congestion, postnasal drip and sore throat.   Eyes: Negative for pain, discharge, redness and itching.  Respiratory: Positive for cough, shortness of breath and wheezing. Negative for stridor.   Gastrointestinal: Negative for diarrhea, nausea and vomiting.  Endocrine: Negative for cold intolerance and heat intolerance.  Musculoskeletal: Negative for arthralgias, joint swelling and myalgias.  Skin: Negative for rash.  Allergic/Immunologic: Negative for environmental allergies and food allergies.    Objective:  Physical exam not obtained as encounter was done via telephone.   Previous notes and tests were reviewed.  I discussed the assessment and treatment plan with the patient. The patient was provided an opportunity to ask questions and all were answered. The patient agreed with the plan and demonstrated an understanding of the instructions.   The patient was advised to call back or seek an in-person evaluation if the symptoms worsen or if the condition fails to improve as anticipated.  I provided 14 minutes of non-face-to-face time during this encounter.  It  was my pleasure to participate in [place] care today. Please feel free to contact me with any questions or concerns.   Sincerely,  Valentina Shaggy, MD

## 2018-12-15 NOTE — Patient Instructions (Addendum)
1. Seasonal and perennial allergic rhinitis (trees, weeds, grasses and dust mites) - Continue with: Zyrtec (cetirizine) 10mg  tablet once daily, Singulair (montelukast) 10mg  daily and Flonase (fluticasone) two sprays per nostril daily - You can use an extra dose of the antihistamine, if needed, for breakthrough symptoms.   2. Adverse food reaction (tree nuts, peanuts)  - Continue to avoid peanuts and tree nuts. - Continue to avoid preservatives as you are able.  - Training for epinephrine auto-injectors provided: AuviQ  3. Mild persistent asthma - with COVID19 positive swab - Although steroids are contraindicated in hospitalized patients, the studies have looked at the use of prednisone and methylprednisolone. - There was a study that showed improvement with dexamethasone use with COVID-19 patients, so I would like to go ahead and start you on this medication. - Prescription sent in for dexamethasone 12 mg to take once and then repeat in 3 days. - The max dose is 16 mg, but since you are sensitive to medications I did go on the lower end. - We did consider adding on Pulmicort in lieu of Asmanex, but being on a systemic steroid I do not think that the inhaled steroid will add much in terms of a clinical improvement. - Therefore we are not can make any changes at this time. - I want daily updates from you, either email or phone. - Daily controller medication(s): Asmanex 255mcg two puffs once daily + Singulair (montelukast) 10mg  - Prior to physical activity: ProAir 2 puffs 10-15 minutes before physical activity. - Rescue medications: ProAir 4 puffs every 4-6 hours as needed - Changes during respiratory infections or worsening symptoms: Increase Asmanex 279mcg to 2 puffs twice daily for ONE TO TWO WEEKS. - Asthma control goals:  * Full participation in all desired activities (may need albuterol before activity) * Albuterol use two time or less a week on average (not counting use with activity) *  Cough interfering with sleep two time or less a month * Oral steroids no more than once a year * No hospitalizations  4. Return in about 3 months (around 03/17/2019). This can be an in-person, a virtual Webex or a telephone follow up visit.   Please inform us of any Emergency Department visits, hospitalizations, or changes in symptoms. Call us before going to the ED for breathing or allergy symptoms since we might be able to fit you in for a sick visit. Feel free to contact us anytime with any questions, problems, or concerns.  It was a pleasure to talk to you today today!  Websites that have reliable patient information: 1. American Academy of Asthma, Allergy, and Immunology: www.aaaai.org 2. Food Allergy Research and Education (FARE): foodallergy.org 3. Mothers of Asthmatics: http://www.asthmacommunitynetwork.org 4. American College of Allergy, Asthma, and Immunology: www.acaai.org  "Like" [place] on Facebook and Instagram for our latest updates!      Make sure you are registered to vote! If you have moved or changed any of your contact information, you will need to get this updated before voting!  In some cases, you MAY be able to register to vote online: [email or web address]    Voter ID laws are NOT going into effect for the General Election in November 2020! DO NOT let this stop you from exercising your right to vote!   Absentee voting is the SAFEST way to vote during the coronavirus pandemic!   Download and print an absentee ballot request form at [email or web address] or you can scan the QR code below with your smart phone:  More information on absentee ballots can be found here: [email or web address]

## 2018-12-17 ENCOUNTER — Telehealth: Payer: Self-pay

## 2018-12-17 MED ORDER — DOXYCYCLINE MONOHYDRATE 100 MG PO CAPS: 100.0000 mg | ORAL_CAPSULE | Freq: Two times a day (BID) | ORAL | 0 refills | Status: AC

## 2018-12-17 NOTE — Telephone Encounter (Signed)
Call to patient to notify of antibiotics sent in.  Pt notified.

## 2018-12-17 NOTE — Telephone Encounter (Signed)
Sent in doxycycline. Please notify the patient.  Salvatore Marvel, MD [facility] [place] [facility]

## 2018-12-17 NOTE — Telephone Encounter (Signed)
Patient is calling to give Dr Ernst Bowler an update on her COVID Symptoms. Patients mucus is getting a Thicker Greenish. She is wondering if she should go ahead and be put on an antibiotic to prevent pneumonia     [facility]

## 2018-12-24 ENCOUNTER — Telehealth (INDEPENDENT_AMBULATORY_CARE_PROVIDER_SITE_OTHER): Payer: Managed Care, Other (non HMO) | Admitting: Allergy & Immunology

## 2018-12-24 DIAGNOSIS — J4530 Mild persistent asthma, uncomplicated: Secondary | ICD-10-CM | POA: Diagnosis not present

## 2018-12-24 DIAGNOSIS — U071 COVID-19: Secondary | ICD-10-CM | POA: Diagnosis not present

## 2018-12-24 DIAGNOSIS — J988 Other specified respiratory disorders: Secondary | ICD-10-CM

## 2018-12-24 DIAGNOSIS — J302 Other seasonal allergic rhinitis: Secondary | ICD-10-CM

## 2018-12-24 DIAGNOSIS — J3089 Other allergic rhinitis: Secondary | ICD-10-CM

## 2018-12-24 DIAGNOSIS — T781XXD Other adverse food reactions, not elsewhere classified, subsequent encounter: Secondary | ICD-10-CM

## 2018-12-24 MED ORDER — DEXAMETHASONE 4 MG PO TABS: 12.0000 mg | ORAL_TABLET | Freq: Once | ORAL | 1 refills | Status: AC

## 2018-12-24 NOTE — Progress Notes (Signed)
RE: Nicole Mendoza MRN: [id number] DOB: May 06, 1991 Date of Telemedicine Visit: 12/24/2018  Referring provider: Oren Section, NP-C Primary care provider: Oren Section, NP-C  Chief Complaint: Cough and Asthma   Telemedicine Follow Up Visit via WebEx: I connected with Nicole Mendoza for a follow up on 12/25/18 by WebEx and verified that I am speaking with the correct person using two identifiers.   I discussed the limitations, risks, security and privacy concerns of performing an evaluation and management service by telemedicine and the availability of in person appointments. I also discussed with the patient that there may be a patient responsible charge related to this service. The patient expressed understanding and agreed to proceed.  Patient is at home accompanied by her husband who provided/contributed to the history.  Provider is at the office.  Visit start time: 4:47 PM Visit end time: 5:13 PM Insurance consent/check in by: [place] consent and medical assistant/nurse: Nicole Mendoza  History of Present Illness:  She is a 28 y.o. female, who is being followed for persistent asthma, recurrent infections, seasonal and perennial allergic rhinitis, and adverse food reactions.   She was last seen on June 30 after having contracted COVID-19.  She was seen via telephone visit.  At that time, her breathing was her main concern.  She also knows that she was having quite a bit of anxiety over having contracted COVID-19.  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  We decided to go ahead and treat her with 2 doses of dexamethasone in hopes of modulating her immune system response to the virus.  We also changed her from Asmanex to Pulmicort because the Asmanex was not covered by her insurance.  We did discuss adding a nebulized long-acting beta agonist, but she preferred to hold off due to her history of tachycardia.  I did  encourage her to continue with her Xopenex as needed.  In the interim, she has sent me daily updates to my email.  Overall, she was trending in the right direction.  She was still having some shortness of breath which seem to be worse at night.  I did try to troubleshoot this and she was already on a proton pump inhibitor for pre-existing reflux.  She has noticed no worsening of her reflux with the use of the dexamethasone.  She does have a pulse oximeter at home and her sats have never gone below 96%.  Her fevers have never been a prominent symptom for her.  She was very frantic last night and was having difficulty breathing which is why she made the appointment today.  However, in the interim, she decided to use her Xopenex nebulizer machine last night.  Apparently her husband has been trying to convince her to do this for the last week or so.  She felt much better after using it and is able to carry on a good conversation today.  She also was able to go across to the other side of her house to grab her dog and returns and is not winded.  This is quite impressive given what she describes in her emails.  She even admits that she is feeling better about things.  She is changing jobs.  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  As part of this work, she will be doing some home visits, but not now during the coronavirus pandemic.  She is very excited  to be changing jobs and she will only be working 4 days a week. Otherwise, there have been no changes to her past medical history, surgical history, family history, or social history.  Assessment and Plan:  Nicole Mendoza is a 28 y.o. female with:  Mild persistent asthma - complicated now by a positive COVID19 swab 12/13/18  Recurrent infections- improved following Pneumovax (protection to 3/23 serotypes initially to 16/23 serotypes post-vaccination of Pneumococcus)  Seasonal and perennial allergic rhinitis(trees, weeds, grasses and  dust mites)  Gastroesophageal reflux disease- on omeprazole  Adverse food reactions - improved with minimizing exposure to food additives and avoiding peanuts/tree nuts  Snoring with perceived poor sleep quality    1. Seasonal and perennial allergic rhinitis (trees, weeds, grasses and dust mites) - Continue with: Zyrtec (cetirizine) 10mg  tablet once daily, Singulair (montelukast) 10mg  daily and Flonase (fluticasone) two sprays per nostril daily - You can use an extra dose of the antihistamine, if needed, for breakthrough symptoms.   2. Adverse food reaction (tree nuts, peanuts)  - Continue to avoid peanuts and tree nuts. - Continue to avoid preservatives as you are able.  - Training for epinephrine auto-injectors provided: AuviQ  3. Mild persistent asthma - with COVID19 positive swab 12/13/18 - It seems that the addition of the dexamethasone has done well to modulate your immune reaction to the coronavirus.  - I did send in a couple of more doses to have on hold at the pharmacy if needed. - We will go ahead and add on the Pulmicort two puffs twice daily, at least while you are continuing to recover from the coronavirus.  - I did send in Perforomist twice daily to have on hand if your breathing continues to be a problem.  - Daily controller medication(s): Pulmicort 123mcg two puffs once daily + Singulair (montelukast) 10mg  - Prior to physical activity: ProAir 2 puffs 10-15 minutes before physical activity. - Rescue medications: ProAir 4 puffs every 4-6 hours as needed - Changes during respiratory infections or worsening symptoms: Increase Pulmicort 149mcg to 4 puffs twice daily for ONE TO TWO WEEKS. - Asthma control goals:  * Full participation in all desired activities (may need albuterol before activity) * Albuterol use two time or less a week on average (not counting use with activity) * Cough interfering with sleep two time or less a month * Oral steroids no more than once a  year * No hospitalizations  4. Return in about 3 months (around 03/26/2019). This can be an in-person, a virtual Webex or a telephone follow up visit.    Diagnostics: None.  Medication List:  Current Outpatient Medications  Medication Sig Dispense Refill   cetirizine (ZYRTEC) 10 MG tablet Take by mouth.     cloNIDine (CATAPRES) 0.1 MG tablet TAKE 1 TABLET BY MOUTH 2 TIMES DAILY AS NEEDED.     doxycycline (MONODOX) 100 MG capsule Take 1 capsule (100 mg total) by mouth 2 (two) times daily for 10 days. 20 capsule 0   EPINEPHrine (AUVI-Q) 0.3 mg/0.3 mL IJ SOAJ injection Use as directed for severe allergic reactions 2 Device 1   fluticasone (FLONASE) 50 MCG/ACT nasal spray Place 2 sprays into both nostrils daily. 48 g 2   hydrochlorothiazide (HYDRODIURIL) 25 MG tablet Take 25 mg by mouth.     levalbuterol (XOPENEX HFA) 45 MCG/ACT inhaler Inhale 2 puffs into the lungs every 6 (six) hours as needed for wheezing. 1 Inhaler 1   levalbuterol (XOPENEX) 1.25 MG/3ML nebulizer solution USE 1 VIAL BY  NEBULIZATION EVERY 6 (SIX) HOURS AS NEEDED FOR WHEEZING.     metoprolol succinate (TOPROL-XL) 100 MG 24 hr tablet Take 100 mg by mouth daily. Take with or immediately following a meal.     montelukast (SINGULAIR) 10 MG tablet TAKE 1 TABLET BY MOUTH EVERY DAY 30 tablet 2   norethindrone (ORTHO MICRONOR) 0.35 MG tablet Take 0.35 mg by mouth.     ondansetron (ZOFRAN) 4 MG tablet Take 4 mg by mouth every 8 (eight) hours as needed for nausea or vomiting.     pantoprazole (PROTONIX) 40 MG tablet Take 40 mg by mouth daily.  3   formoterol (PERFOROMIST) 20 MCG/2ML nebulizer solution Take 2 mLs (20 mcg total) by nebulization 2 (two) times daily. 120 mL 1   hydrOXYzine (ATARAX/VISTARIL) 25 MG tablet Take 1-2 tablets as needed for anxiety. 60 tablet 1   No current facility-administered medications for this visit.    Allergies: Allergies  Allergen Reactions   Budesonide-Formoterol Fumarate Other  (See Comments)    Causes Hypertension   Penicillins Hives   Albuterol Palpitations   Losartan Rash   I reviewed her past medical history, social history, family history, and environmental history and no significant changes have been reported from previous visits.  Review of Systems  Constitutional: Negative for activity change, appetite change, chills and fever.  HENT: Negative for congestion, postnasal drip, rhinorrhea, sinus pressure, sinus pain, sneezing and sore throat.   Eyes: Negative for pain, discharge, redness and itching.  Respiratory: Positive for cough, shortness of breath and wheezing. Negative for stridor.   Gastrointestinal: Negative for diarrhea, nausea and vomiting.  Musculoskeletal: Negative for arthralgias, joint swelling and myalgias.  Skin: Negative for rash.  Allergic/Immunologic: Negative for environmental allergies, food allergies and immunocompromised state.    Objective:  Physical exam not obtained as encounter was done via telephone.   Previous notes and tests were reviewed.  I discussed the assessment and treatment plan with the patient. The patient was provided an opportunity to ask questions and all were answered. The patient agreed with the plan and demonstrated an understanding of the instructions.   The patient was advised to call back or seek an in-person evaluation if the symptoms worsen or if the condition fails to improve as anticipated.  I provided 26 minutes of non-face-to-face time during this encounter.  It was my pleasure to participate in [place] care today. Please feel free to contact me with any questions or concerns.   Sincerely,  Valentina Shaggy, MD

## 2018-12-25 ENCOUNTER — Emergency Department (HOSPITAL_BASED_OUTPATIENT_CLINIC_OR_DEPARTMENT_OTHER): Payer: Managed Care, Other (non HMO)

## 2018-12-25 ENCOUNTER — Encounter (HOSPITAL_BASED_OUTPATIENT_CLINIC_OR_DEPARTMENT_OTHER): Payer: Self-pay | Admitting: Emergency Medicine

## 2018-12-25 ENCOUNTER — Encounter: Payer: Self-pay | Admitting: Allergy & Immunology

## 2018-12-25 ENCOUNTER — Emergency Department (HOSPITAL_BASED_OUTPATIENT_CLINIC_OR_DEPARTMENT_OTHER)
Admission: EM | Admit: 2018-12-25 | Discharge: 2018-12-25 | Disposition: A | Discharge status: Home / Self Care | Payer: Managed Care, Other (non HMO) | Attending: Emergency Medicine | Admitting: Emergency Medicine

## 2018-12-25 ENCOUNTER — Other Ambulatory Visit: Payer: Self-pay

## 2018-12-25 DIAGNOSIS — I10 Essential (primary) hypertension: Secondary | ICD-10-CM | POA: Insufficient documentation

## 2018-12-25 DIAGNOSIS — J45909 Unspecified asthma, uncomplicated: Secondary | ICD-10-CM | POA: Insufficient documentation

## 2018-12-25 DIAGNOSIS — R0602 Shortness of breath: Secondary | ICD-10-CM | POA: Diagnosis present

## 2018-12-25 DIAGNOSIS — Z8616 Personal history of COVID-19: Secondary | ICD-10-CM

## 2018-12-25 DIAGNOSIS — Z79899 Other long term (current) drug therapy: Secondary | ICD-10-CM | POA: Diagnosis not present

## 2018-12-25 DIAGNOSIS — Z8619 Personal history of other infectious and parasitic diseases: Secondary | ICD-10-CM

## 2018-12-25 DIAGNOSIS — E876 Hypokalemia: Secondary | ICD-10-CM | POA: Insufficient documentation

## 2018-12-25 DIAGNOSIS — E860 Dehydration: Secondary | ICD-10-CM | POA: Diagnosis not present

## 2018-12-25 HISTORY — DX: Essential (primary) hypertension: I10

## 2018-12-25 HISTORY — DX: Gastro-esophageal reflux disease without esophagitis: K21.9

## 2018-12-25 HISTORY — DX: Tachycardia, unspecified: R00.0

## 2018-12-25 LAB — COMPREHENSIVE METABOLIC PANEL
ALT: 34 U/L (ref 0–44)
AST: 21 U/L (ref 15–41)
Albumin: 4.4 g/dL (ref 3.5–5.0)
Alkaline Phosphatase: 72 U/L (ref 38–126)
Anion gap: 13 (ref 5–15)
BUN: 11 mg/dL (ref 6–20)
CO2: 22 mmol/L (ref 22–32)
Calcium: 9.9 mg/dL (ref 8.9–10.3)
Chloride: 100 mmol/L (ref 98–111)
Creatinine, Ser: 0.63 mg/dL (ref 0.44–1.00)
GFR calc Af Amer: >60 mL/min (ref >60)
GFR calc non Af Amer: >60 mL/min (ref >60)
Glucose, Bld: 140 mg/dL — ABNORMAL HIGH (ref 70–99)
Potassium: 2.6 mmol/L — CL (ref 3.5–5.1)
Sodium: 135 mmol/L (ref 135–145)
Total Bilirubin: 1.2 mg/dL (ref 0.3–1.2)
Total Protein: 8.7 g/dL — ABNORMAL HIGH (ref 6.5–8.1)

## 2018-12-25 LAB — CBC
HCT: 44 % (ref 36.0–46.0)
Hemoglobin: 14.7 g/dL (ref 12.0–15.0)
MCH: 26.2 pg (ref 26.0–34.0)
MCHC: 33.4 g/dL (ref 30.0–36.0)
MCV: 78.4 fL — ABNORMAL LOW (ref 80.0–100.0)
Platelets: 385 10*3/uL (ref 150–400)
RBC: 5.61 MIL/uL — ABNORMAL HIGH (ref 3.87–5.11)
RDW: 13.2 % (ref 11.5–15.5)
WBC: 11.8 10*3/uL — ABNORMAL HIGH (ref 4.0–10.5)
nRBC: 0 % (ref 0.0–0.2)

## 2018-12-25 LAB — TROPONIN I (HIGH SENSITIVITY)
Troponin I (High Sensitivity): 3 ng/L (ref <18)
Troponin I (High Sensitivity): 3 ng/L (ref <18)

## 2018-12-25 LAB — D-DIMER, QUANTITATIVE: D-Dimer, Quant: 0.33 ug/mL-FEU (ref 0.00–0.50)

## 2018-12-25 MED ORDER — POTASSIUM CHLORIDE 10 MEQ/100ML IV SOLN
10.0000 meq | INTRAVENOUS | Status: AC
  Administered 2018-12-25 (×2): 10 meq via INTRAVENOUS
  Filled 2018-12-25: qty 100

## 2018-12-25 MED ORDER — PERFOROMIST 20 MCG/2ML IN NEBU: 20.0000 ug | INHALATION_SOLUTION | Freq: Two times a day (BID) | RESPIRATORY_TRACT | 1 refills | Status: DC

## 2018-12-25 MED ORDER — POTASSIUM CHLORIDE CRYS ER 20 MEQ PO TBCR
40.0000 meq | EXTENDED_RELEASE_TABLET | Freq: Once | ORAL | Status: AC
  Administered 2018-12-25: 40 meq via ORAL
  Filled 2018-12-25: qty 2

## 2018-12-25 MED ORDER — HYDROXYZINE HCL 25 MG PO TABS: ORAL_TABLET | ORAL | 1 refills | Status: DC

## 2018-12-25 MED ORDER — POTASSIUM CHLORIDE CRYS ER 20 MEQ PO TBCR: 20.0000 meq | EXTENDED_RELEASE_TABLET | Freq: Two times a day (BID) | ORAL | 0 refills | Status: DC

## 2018-12-25 MED ORDER — HYDROXYZINE HCL 25 MG PO TABS
25.0000 mg | ORAL_TABLET | Freq: Once | ORAL | Status: AC | PRN
  Administered 2018-12-25: 25 mg via ORAL
  Filled 2018-12-25: qty 1

## 2018-12-25 MED ORDER — SODIUM CHLORIDE 0.9 % IV BOLUS
1000.0000 mL | Freq: Once | INTRAVENOUS | Status: AC
  Administered 2018-12-25: 1000 mL via INTRAVENOUS

## 2018-12-25 NOTE — ED Notes (Addendum)
Pt ambulated well in room with no SHOB, sats 98-99%. HR rose to 140 but fluctuated from 125-140.

## 2018-12-25 NOTE — Discharge Instructions (Signed)
Thank you for allowing me to care for you today in the Emergency Department.   Your potassium was low today.  You were given both IV and oral potassium.  Starting tomorrow morning, take potassium twice daily for the next 5 days.  This prescription has been called into your pharmacy.   Please follow-up with your primary care provider since you have had difficulty with low potassium on multiple occasions.  This may be related to your medications for your blood pressure.  You can take hydroxyzine as prescribed for anxiety.  You were dehydrated today.  Make sure that you are drinking at least 64 ounces of water daily, particularly since you have been sick recently.  Keep your follow-up appointment for your sleep study next week.  Return to the emergency department if you develop respiratory distress, if you pass out, if your fingers or lips turn blue, or if you develop other new, concerning symptoms.

## 2018-12-25 NOTE — ED Notes (Signed)
ED Provider at bedside,discussing results with patient at this time.

## 2018-12-25 NOTE — ED Provider Notes (Signed)
[facility] EMERGENCY DEPARTMENT Provider Note   CSN: [id number] Arrival date & time: 12/25/18  1542    History   Chief Complaint Chief Complaint  Patient presents with   Shortness of Breath    HPI Nicole Mendoza is a 28 y.o. female with a history of sinus tachycardia, hypertension, recurrent upper respiratory infection, GERD, fatty liver, asthma, angioedema, and allergic rhinitis who presents to the emergency department with a chief complaint of shortness of breath.  The patient reports that she was diagnosed with COVID-19 almost 2 weeks ago.  She reports that she has been afebrile for the duration of her illness.  She reports that she has had some waxing and waning shortness of breath, but reports that she was unable to sleep last night due to feeling short of breath.  She states I was afraid that if I went to sleep that I might not wake up.  She reports that she was previously told that she may have sleep apnea and is scheduled for a sleep study next week, but reports that she has never had difficulty sleeping.  She reports that she attempted to get some sleep this morning and asked her husband to watch her sleep.  He reports that he observed her snoring and then see me as if she stopped breathing for just a second before her breathing resumed.  She reports that during the duration of her COVID-19 illness that she did complete a course of doxycycline, she was given dexamethasone by primary care and has intermittently been using her Xopenex treatments at home.  She reports her last Xopenex nebulizer was yesterday.  No albuterol or Xopenex usage today.  She reports that she has had minimal diarrhea.  She reports associated chest tightness and nonproductive cough.  She denies a h/o of anxiety, but reports she has been feeling very anxious since she was diagnosed with COVID-19. She denies fever, chills, chest pain, abdominal pain, nausea, vomiting, dizziness, lightheadedness, or  urinary complaints.  She reports that her baseline HR runs 90 to the lows 100s.  No personal or family history of VTE.  She takes a progesterone containing OCP.  Nicole Mendoza was evaluated in Emergency Department on 12/25/2018 for the symptoms described in the history of present illness. She was evaluated in the context of the global COVID-19 pandemic, which necessitated consideration that the patient might be at risk for infection with the SARS-CoV-2 virus that causes COVID-19. Institutional protocols and algorithms that pertain to the evaluation of patients at risk for COVID-19 are in a state of rapid change based on information released by regulatory bodies including the CDC and federal and state organizations. These policies and algorithms were followed during the patient's care in the ED.       The history is provided by the patient. No language interpreter was used.    Past Medical History:  Diagnosis Date   Allergic rhinitis    Angio-edema    Asthma    Fatty liver    GERD (gastroesophageal reflux disease)    Hypertension    Recurrent upper respiratory infection (URI)    Tachycardia     Patient Active Problem List   Diagnosis Date Noted   Mild persistent asthma without complication 32/20/2542   Essential hypertension 04/22/2017   Gastroesophageal reflux disease without esophagitis 12/22/2015   Current use of beta blocker 12/22/2015   Asthma with acute exacerbation 05/09/2015   Acute maxillary sinusitis 05/09/2015   Hypertension 05/09/2015   Allergic rhinitis 05/09/2015  History reviewed. No pertinent surgical history.   OB History   No obstetric history on file.      Home Medications    Prior to Admission medications   Medication Sig Start Date End Date Taking? Authorizing Provider  cetirizine (ZYRTEC) 10 MG tablet Take by mouth.    [provider]  cloNIDine (CATAPRES) 0.1 MG tablet TAKE 1 TABLET BY MOUTH 2 TIMES DAILY AS NEEDED.  07/18/17   [provider]  doxycycline (MONODOX) 100 MG capsule Take 1 capsule (100 mg total) by mouth 2 (two) times daily for 10 days. 12/17/18 12/27/18  Valentina Shaggy, MD  EPINEPHrine (AUVI-Q) 0.3 mg/0.3 mL IJ SOAJ injection Use as directed for severe allergic reactions 11/19/17   Valentina Shaggy, MD  fluticasone [facility]) 50 MCG/ACT nasal spray Place 2 sprays into both nostrils daily. 07/02/16   Charlies Silvers, MD  formoterol (PERFOROMIST) 20 MCG/2ML nebulizer solution Take 2 mLs (20 mcg total) by nebulization 2 (two) times daily. 12/25/18 01/24/19  Valentina Shaggy, MD  hydrochlorothiazide (HYDRODIURIL) 25 MG tablet Take 25 mg by mouth. 07/17/15   [provider]  hydrOXYzine (ATARAX/VISTARIL) 25 MG tablet Take 1-2 tablets as needed for anxiety. 12/25/18   Valentina Shaggy, MD  levalbuterol [facility]) 45 MCG/ACT inhaler Inhale 2 puffs into the lungs every 6 (six) hours as needed for wheezing. 10/29/18   Valentina Shaggy, MD  levalbuterol (XOPENEX) 1.25 MG/3ML nebulizer solution USE 1 VIAL BY NEBULIZATION EVERY 6 (SIX) HOURS AS NEEDED FOR WHEEZING. 09/24/17   [provider]  metoprolol succinate (TOPROL-XL) 100 MG 24 hr tablet Take 100 mg by mouth daily. Take with or immediately following a meal.    [provider]  montelukast (SINGULAIR) 10 MG tablet TAKE 1 TABLET BY MOUTH EVERY DAY 10/02/18   Valentina Shaggy, MD  norethindrone (ORTHO MICRONOR) 0.35 MG tablet Take 0.35 mg by mouth. 11/15/15   [provider]  ondansetron (ZOFRAN) 4 MG tablet Take 4 mg by mouth every 8 (eight) hours as needed for nausea or vomiting.    [provider]  pantoprazole (PROTONIX) 40 MG tablet Take 40 mg by mouth daily. 04/13/15   [provider]  potassium chloride SA (K-DUR) 20 MEQ tablet Take 1 tablet (20 mEq total) by mouth 2 (two) times daily for 5 days. 12/25/18 12/30/18  Joslyn Ramos A, PA-C  mometasone (ASMANEX) 220 MCG/INH  inhaler Inhale 2 puffs into the lungs 2 (two) times daily as needed. 12/14/18 12/14/18  Valentina Shaggy, MD  PULMICORT [facility] 90 MCG/ACT inhaler Please specify directions, refills and quantity 12/14/18 12/24/18  Valentina Shaggy, MD    Family History Family History  Problem Relation Age of Onset   Allergic rhinitis Father    Asthma Father    Allergic rhinitis Sister    Asthma Sister    Angioedema Neg Hx    Atopy Neg Hx    Eczema Neg Hx    Immunodeficiency Neg Hx    Urticaria Neg Hx     Social History Social History   Tobacco Use   Smoking status: Never Smoker   Smokeless tobacco: Never Used  Substance Use Topics   Alcohol use: No    Alcohol/week: 0.0 standard drinks   Drug use: No     Allergies   Budesonide-formoterol fumarate, Penicillins, Albuterol, and Losartan   Review of Systems Review of Systems  Constitutional: Negative for activity change, chills and fever.  HENT: Negative for congestion and  sore throat.   Respiratory: Positive for cough, chest tightness and shortness of breath. Negative for wheezing.   Cardiovascular: Negative for chest pain, palpitations and leg swelling.  Gastrointestinal: Positive for diarrhea. Negative for abdominal pain, constipation, nausea and vomiting.  Genitourinary: Negative for dysuria.  Musculoskeletal: Negative for back pain, gait problem, myalgias, neck pain and neck stiffness.  Skin: Negative for rash and wound.  Allergic/Immunologic: Negative for immunocompromised state.  Neurological: Negative for dizziness, syncope, weakness, numbness and headaches.  Psychiatric/Behavioral: Negative for confusion.     Physical Exam Updated Vital Signs BP (!) 140/91    Pulse (!) 102    Temp (!) 97.3 F (36.3 C)    Resp 19    Ht 5\' 8"  (1.727 m)    Wt 127 kg    LMP 12/25/2018    SpO2 100%    BMI 42.57 kg/m   Physical Exam Vitals signs and nursing note reviewed.  Constitutional:      Appearance: She is  well-developed. She is obese.     Comments: Anxious appearing.  HENT:     Head: Normocephalic and atraumatic.     Mouth/Throat:     Mouth: Mucous membranes are dry.  Eyes:     General: No scleral icterus.       Right eye: No discharge.        Left eye: No discharge.     Extraocular Movements: Extraocular movements intact.     Pupils: Pupils are equal, round, and reactive to light.  Neck:     Musculoskeletal: Normal range of motion.  Cardiovascular:     Rate and Rhythm: Tachycardia present.     Heart sounds: No murmur. No friction rub. No gallop.   Pulmonary:     Effort: Pulmonary effort is normal. No respiratory distress.     Breath sounds: Normal breath sounds. No stridor. No wheezing, rhonchi or rales.     Comments: No increased work of breathing.  No retractions, accessory muscle use, or nasal flaring. Chest:     Chest wall: No tenderness.  Abdominal:     General: Abdomen is flat. There is no distension.     Palpations: There is no mass.     Tenderness: There is no abdominal tenderness. There is no right CVA tenderness, left CVA tenderness, guarding or rebound.     Hernia: No hernia is present.  Musculoskeletal: Normal range of motion.  Skin:    Capillary Refill: Capillary refill takes 2 to 3 seconds.  Neurological:     General: No focal deficit present.     Mental Status: She is alert.      ED Treatments / Results  Labs (all labs ordered are listed, but only abnormal results are displayed) Labs Reviewed  CBC - Abnormal; Notable for the following components:      Result Value   WBC 11.8 (*)    RBC 5.61 (*)    MCV 78.4 (*)    All other components within normal limits  COMPREHENSIVE METABOLIC PANEL - Abnormal; Notable for the following components:   Potassium 2.6 (*)    Glucose, Bld 140 (*)    Total Protein 8.7 (*)    All other components within normal limits  D-DIMER, QUANTITATIVE (NOT AT [facility])  TROPONIN I (HIGH SENSITIVITY)  TROPONIN I (HIGH SENSITIVITY)     EKG None  Radiology Dg Chest Portable 1 View  Result Date: 12/25/2018 CLINICAL DATA:  Cough and shortness of breath EXAM: PORTABLE CHEST 1 VIEW COMPARISON:  None. FINDINGS: Lungs are clear. Heart size and pulmonary vascularity are normal. No adenopathy. No bone lesions. IMPRESSION: No abnormality noted. Electronically Signed   By: Lowella Grip III M.D.   On: 12/25/2018 16:57    Procedures Procedures (including critical care time)  Medications Ordered in ED Medications  hydrOXYzine (ATARAX/VISTARIL) tablet 25 mg (25 mg Oral Given 12/25/18 1811)  potassium chloride 10 mEq in 100 mL IVPB (0 mEq Intravenous Stopped 12/25/18 2139)  potassium chloride SA (K-DUR) CR tablet 40 mEq (40 mEq Oral Given 12/25/18 1917)  sodium chloride 0.9 % bolus 1,000 mL (0 mLs Intravenous Stopped 12/25/18 2019)     Initial Impression / Assessment and Plan / ED Course  I have reviewed the triage vital signs and the nursing notes.  Pertinent labs & imaging results that were available during my care of the patient were reviewed by me and considered in my medical decision making (see chart for details).  Clinical Course as of Dec 24 2301  Fri Dec 25, 2018  1848 EKG is sinus tachycardia with a rate of 126 normal intervals diffuse ST depressions anterior and lateral and inferior.  No prior EKG to compare with.   [MB]    Clinical Course User Index [MB] Nicole Rasmussen, MD       28 year old female with a history of sinus tachycardia, hypertension, recurrent upper respiratory infection, GERD, fatty liver, asthma, angioedema, and allergic rhinitis who presents to the emergency department with changes in her breathing.  The patient was diagnosed with COVID was 2 weeks ago.  She had difficulty sleeping last night due to anxiety that she may stop breathing.  She is scheduled for an outpatient sleep study in the next week.  On arrival, patient was tachycardic in the 120s that increased to 140s while she was  ambulating.  She does appear very anxious; however, this should not account for her persistent tachycardia.  She says her baseline heart rate is around 90-105.  This is consistent with previous vitals in her medical record.  She has had no hypoxia and sats have been in the high 90s to 100% on room air.  Although she has a history of asthma, she is required minimal Xopenex nebulizer treatments.  Her primary concern was that she was having difficulty sleeping last night.  I do suspect that there is a large component of anxiety state.  She was given a dose of hydroxyzine in the ER that improved her heart rate to 110s to mid 120s.  Labs are notable for hypokalemia of 2.6, which was replenished with both IV and oral potassium.  D-dimer was negative.  Transaminases were normal.  CBC appears hemoconcentrated when trended with previous CBCs.  I suspected that she is dehydrated.  She was given 1 L of fluids and heart rate normalized.  Rectal temp was normal.  She is continued to have normal respirations and has not complained of shortness of breath.  She reports that she is feeling much better since hydroxyzine.  Will plan to discharge the patient to home with potassium chloride.  Recommended hydroxyzine for anxiety and to keep her appointment for a sleep study.  EKG did show some ST depressions, but troponin was normal.  There was no previous EKG for comparison.  Given normalization of her vital signs, I think it is appropriate for discharge to home.  Return precautions given.  She is safe for discharge to home with outpatient follow-up.  Final Clinical Impressions(s) / ED Diagnoses  Final diagnoses:  Dehydration  Hypokalemia  History of 2019 novel coronavirus disease (COVID-19)    ED Discharge Orders         Ordered    potassium chloride SA (K-DUR) 20 MEQ tablet  2 times daily     12/25/18 2123           Dai Apel, Laymond Purser, PA-C 12/25/18 2303    Nicole Rasmussen, MD 12/26/18 0900

## 2018-12-25 NOTE — Patient Instructions (Addendum)
1. Seasonal and perennial allergic rhinitis (trees, weeds, grasses and dust mites) - Continue with: Zyrtec (cetirizine) 10mg  tablet once daily, Singulair (montelukast) 10mg  daily and Flonase (fluticasone) two sprays per nostril daily - You can use an extra dose of the antihistamine, if needed, for breakthrough symptoms.   2. Adverse food reaction (tree nuts, peanuts)  - Continue to avoid peanuts and tree nuts. - Continue to avoid preservatives as you are able.  - Training for epinephrine auto-injectors provided: AuviQ  3. Mild persistent asthma - with COVID19 positive swab 12/13/18 - It seems that the addition of the dexamethasone has done well to modulate your immune reaction to the coronavirus.  - I did send in a couple of more doses to have on hold at the pharmacy if needed. - We will go ahead and add on the Pulmicort two puffs twice daily, at least while you are continuing to recover from the coronavirus.  - I did send in Perforomist twice daily to have on hand if your breathing continues to be a problem.  - Daily controller medication(s): Pulmicort 15mcg two puffs once daily + Singulair (montelukast) 10mg  - Prior to physical activity: ProAir 2 puffs 10-15 minutes before physical activity. - Rescue medications: ProAir 4 puffs every 4-6 hours as needed - Changes during respiratory infections or worsening symptoms: Increase Pulmicort 137mcg to 4 puffs twice daily for ONE TO TWO WEEKS. - Asthma control goals:  * Full participation in all desired activities (may need albuterol before activity) * Albuterol use two time or less a week on average (not counting use with activity) * Cough interfering with sleep two time or less a month * Oral steroids no more than once a year * No hospitalizations  4. Return in about 3 months (around 03/26/2019). This can be an in-person, a virtual Webex or a telephone follow up visit.   Please inform us of any Emergency Department visits, hospitalizations, or  changes in symptoms. Call us before going to the ED for breathing or allergy symptoms since we might be able to fit you in for a sick visit. Feel free to contact us anytime with any questions, problems, or concerns.  It was a pleasure to talk to you today today!  Websites that have reliable patient information: 1. American Academy of Asthma, Allergy, and Immunology: www.aaaai.org 2. Food Allergy Research and Education (FARE): foodallergy.org 3. Mothers of Asthmatics: http://www.asthmacommunitynetwork.org 4. American College of Allergy, Asthma, and Immunology: www.acaai.org  "Like" [place] on Facebook and Instagram for our latest updates!      Make sure you are registered to vote! If you have moved or changed any of your contact information, you will need to get this updated before voting!  In some cases, you MAY be able to register to vote online: [email or web address]    Voter ID laws are NOT going into effect for the General Election in November 2020! DO NOT let this stop you from exercising your right to vote!   Absentee voting is the SAFEST way to vote during the coronavirus pandemic!   Download and print an absentee ballot request form at [email or web address] or you can scan the QR code below with your smart phone:      More information on absentee ballots can be found here: [email or web address]

## 2018-12-25 NOTE — ED Notes (Signed)
Portable CXR done.

## 2018-12-25 NOTE — ED Notes (Signed)
ED Provider at bedside. 

## 2018-12-25 NOTE — ED Triage Notes (Signed)
SOB increasing again after Positive Covid test 12 days ago.  Sts she had been doing better but yesterday her breathing got tight again.  Used all of her Asthma meds and they have not helped this time.  Sts she did not sleep last night because she could not breathe.

## 2018-12-25 NOTE — ED Notes (Signed)
Rectal 97.3

## 2018-12-28 ENCOUNTER — Ambulatory Visit (INDEPENDENT_AMBULATORY_CARE_PROVIDER_SITE_OTHER): Payer: Managed Care, Other (non HMO) | Admitting: Neurology

## 2018-12-28 DIAGNOSIS — Z820 Family history of epilepsy and other diseases of the nervous system: Secondary | ICD-10-CM

## 2018-12-28 DIAGNOSIS — G4733 Obstructive sleep apnea (adult) (pediatric): Secondary | ICD-10-CM | POA: Diagnosis not present

## 2018-12-28 DIAGNOSIS — J988 Other specified respiratory disorders: Secondary | ICD-10-CM

## 2018-12-28 DIAGNOSIS — G4719 Other hypersomnia: Secondary | ICD-10-CM

## 2018-12-28 DIAGNOSIS — R0681 Apnea, not elsewhere classified: Secondary | ICD-10-CM

## 2018-12-28 DIAGNOSIS — Z9189 Other specified personal risk factors, not elsewhere classified: Secondary | ICD-10-CM

## 2018-12-28 DIAGNOSIS — R0683 Snoring: Secondary | ICD-10-CM

## 2018-12-28 DIAGNOSIS — J4520 Mild intermittent asthma, uncomplicated: Secondary | ICD-10-CM

## 2018-12-28 DIAGNOSIS — J3089 Other allergic rhinitis: Secondary | ICD-10-CM

## 2018-12-29 ENCOUNTER — Telehealth: Payer: Self-pay | Admitting: Allergy & Immunology

## 2018-12-29 ENCOUNTER — Encounter: Payer: Self-pay | Admitting: Allergy & Immunology

## 2018-12-29 ENCOUNTER — Other Ambulatory Visit: Payer: Self-pay

## 2018-12-29 ENCOUNTER — Ambulatory Visit (INDEPENDENT_AMBULATORY_CARE_PROVIDER_SITE_OTHER): Payer: Managed Care, Other (non HMO) | Admitting: Allergy & Immunology

## 2018-12-29 VITALS — BP 134/88 | HR 107 | Resp 18

## 2018-12-29 DIAGNOSIS — J4530 Mild persistent asthma, uncomplicated: Secondary | ICD-10-CM

## 2018-12-29 DIAGNOSIS — T7800XD Anaphylactic reaction due to unspecified food, subsequent encounter: Secondary | ICD-10-CM

## 2018-12-29 DIAGNOSIS — J988 Other specified respiratory disorders: Secondary | ICD-10-CM

## 2018-12-29 DIAGNOSIS — J3089 Other allergic rhinitis: Secondary | ICD-10-CM | POA: Diagnosis not present

## 2018-12-29 DIAGNOSIS — U071 COVID-19: Secondary | ICD-10-CM

## 2018-12-29 DIAGNOSIS — J302 Other seasonal allergic rhinitis: Secondary | ICD-10-CM

## 2018-12-29 DIAGNOSIS — B999 Unspecified infectious disease: Secondary | ICD-10-CM

## 2018-12-29 MED ORDER — SUCRALFATE 1 G PO TABS: 2.0000 g | ORAL_TABLET | Freq: Three times a day (TID) | ORAL | 0 refills | Status: DC

## 2018-12-29 MED ORDER — FAMOTIDINE 20 MG PO TABS: 20.0000 mg | ORAL_TABLET | Freq: Two times a day (BID) | ORAL | 3 refills | Status: DC

## 2018-12-29 MED ORDER — BUDESONIDE 0.5 MG/2ML IN SUSP: 0.5000 mg | Freq: Two times a day (BID) | RESPIRATORY_TRACT | 0 refills | Status: DC

## 2018-12-29 NOTE — Patient Instructions (Addendum)
1. Seasonal and perennial allergic rhinitis (trees, weeds, grasses and dust mites) - Continue with: Zyrtec (cetirizine) 10mg  tablet once daily, Singulair (montelukast) 10mg  daily and Flonase (fluticasone) two sprays per nostril daily - You can use an extra dose of the antihistamine, if needed, for breakthrough symptoms.   2. Adverse food reaction (tree nuts, peanuts)  - Continue to avoid peanuts and tree nuts. - Continue to avoid preservatives as you are able.  - Training for epinephrine auto-injectors provided: AuviQ  3. Mild persistent asthma - with COVID19 positive swab 12/13/18 - Your lung testing looked quite good today. - You are moving air well in all lung fields.  - I do not see any evidence of pneumonia on listening to your lungs.  - Go ahead and start the Perforomist twice daily and Pulmicort twice daily via the nebulizer. - We are going to do this for two weeks - Stop the Pulmicort Twisthaler since you are starting the nebulizer Pulmicort. - I think that the prednisone is not needed at this time.  - Daily controller medication(s): Pulmicort 0.5mg  nebs + Perforomist (twice daily, mixed together in the nebs) + Singulair 10mg . - Prior to physical activity: ProAir 2 puffs 10-15 minutes before physical activity. - Rescue medications: ProAir 4 puffs every 4-6 hours as needed - Changes during respiratory infections or worsening symptoms: Increase Pulmicort 130mcg to 4 puffs twice daily for ONE TO TWO WEEKS. - Asthma control goals:  * Full participation in all desired activities (may need albuterol before activity) * Albuterol use two time or less a week on average (not counting use with activity) * Cough interfering with sleep two time or less a month * Oral steroids no more than once a year * No hospitalizations  4. Reflux - worsened by all of the steroids that you have been on - Continue with Protonix twice daily. - Start Pepcid 20mg  twice daily for one month.  - Add on Carafate  three times daily for one month.   5. Return in about 3 months (around 03/31/2019). This can be an in-person, a virtual Webex or a telephone follow up visit.   Please inform us of any Emergency Department visits, hospitalizations, or changes in symptoms. Call us before going to the ED for breathing or allergy symptoms since we might be able to fit you in for a sick visit. Feel free to contact us anytime with any questions, problems, or concerns.  It was a pleasure to talk to you today today!  Websites that have reliable patient information: 1. American Academy of Asthma, Allergy, and Immunology: www.aaaai.org 2. Food Allergy Research and Education (FARE): foodallergy.org 3. Mothers of Asthmatics: http://www.asthmacommunitynetwork.org 4. American College of Allergy, Asthma, and Immunology: www.acaai.org  "Like" [place] on Facebook and Instagram for our latest updates!      Make sure you are registered to vote! If you have moved or changed any of your contact information, you will need to get this updated before voting!  In some cases, you MAY be able to register to vote online: [email or web address]    Voter ID laws are NOT going into effect for the General Election in November 2020! DO NOT let this stop you from exercising your right to vote!   Absentee voting is the SAFEST way to vote during the coronavirus pandemic!   Download and print an absentee ballot request form at [email or web address] or you can scan the QR code below with your smart phone:      More information on absentee  ballots can be found here: [email or web address]

## 2018-12-29 NOTE — Progress Notes (Signed)
FOLLOW UP  Date of Service/Encounter:  12/29/18   Assessment:   Mild persistent asthma-complicated now by a positive COVID19 swab 12/13/18 (18 days out)  Recurrent infections- improved following Pneumovax (protection to 3/23 serotypes initially to 16/23 serotypes post-vaccination of Pneumococcus)  Seasonal and perennial allergic rhinitis(trees, weeds, grasses and dust mites)  Gastroesophageal reflux disease- on pantoprazole (adding on Pepcid and Carafate)   Adverse food reactions - improved with minimizing exposure to food additives and avoiding peanuts/tree nuts  Snoring with perceived poor sleep quality - just completed a sleep study  Plan/Recommendations:   1. Seasonal and perennial allergic rhinitis (trees, weeds, grasses and dust mites) - Continue with: Zyrtec (cetirizine) 10mg  tablet once daily, Singulair (montelukast) 10mg  daily and Flonase (fluticasone) two sprays per nostril daily - You can use an extra dose of the antihistamine, if needed, for breakthrough symptoms.   2. Adverse food reaction (tree nuts, peanuts)  - Continue to avoid peanuts and tree nuts. - Continue to avoid preservatives as you are able.  - Training for epinephrine auto-injectors provided: Auvi[employer]  3. Mild persistent asthma - with COVID19 positive swab 12/13/18 - Your lung testing looked quite good today. - You are moving air well in all lung fields.  - I do not see any evidence of pneumonia on listening to your lungs.  - Go ahead and start the Perforomist twice daily and Pulmicort twice daily via the nebulizer. - We are going to do this for two weeks - Stop the Pulmicort Twisthaler since you are starting the nebulizer Pulmicort. - I think that the prednisone is not needed at this time.  - Daily controller medication(s): Pulmicort 0.5mg  nebs + Perforomist (twice daily, mixed together in the nebs) + Singulair 10mg . - Prior to physical activity: ProAir 2 puffs 10-15 minutes before physical  activity. - Rescue medications: ProAir 4 puffs every 4-6 hours as needed - Changes during respiratory infections or worsening symptoms: Increase Pulmicort 177mcg to 4 puffs twice daily for ONE TO TWO WEEKS. - Asthma control goals:  * Full participation in all desired activities (may need albuterol before activity) * Albuterol use two time or less a week on average (not counting use with activity) * Cough interfering with sleep two time or less a month * Oral steroids no more than once a year * No hospitalizations  4. Reflux - worsened by all of the steroids that you have been on - Continue with Protonix twice daily. - Start Pepcid 20mg  twice daily for one month.  - Add on Carafate three times daily for one month.   5. Return in about 3 months (around 03/31/2019). This can be an in-person, a virtual Webex or a telephone follow up visit.  Subjective:   Nicole Mendoza is a 28 y.o. female presenting today for follow up of  Chief Complaint  Patient presents with  . Shortness of Breath    Nicole Mendoza has a history of the following: Patient Active Problem List   Diagnosis Date Noted  . Mild persistent asthma without complication 51/76/1607  . Essential hypertension 04/22/2017  . Gastroesophageal reflux disease without esophagitis 12/22/2015  . Current use of beta blocker 12/22/2015  . Asthma with acute exacerbation 05/09/2015  . Acute maxillary sinusitis 05/09/2015  . Hypertension 05/09/2015  . Allergic rhinitis 05/09/2015    History obtained from: chart review and patient.  Nicole Mendoza is a 28 y.o. female presenting for a follow up visit.  She was last seen on July 9 via a video visit.  At that time, she was 12 days out from her positive swab and she actually was doing very good.  Her shortness of breath episodes had improved.  She had very recently been evaluated and treated in the emergency room for tachycardia and hypokalemia.  She has had so much improvement that she was not admitted.   At the last visit, we did send in Perforomist to use twice daily to help her breathe better.  She had already been treated with 2 courses of dexamethasone which I felt was adequate.  She was also feeling better, so I did not feel that more steroids were necessarily in the interim, ed.  We continued her on Pulmicort 180 mcg 2 puffs once daily and Singulair 10 mg daily.  In the interim, she has had a relapsing and remitting course.  She goes from feeling very good to quite poor.  She wanted to be seen for an evaluation of her breathing, specifically with a spirometry.  She has not had a fever during the entire course of the illness.  She has been using all of her medications as prescribed.  Interestingly, she did get better when she took a Pepcid of her husband's.  She remains on her pantoprazole.  She has not tried using Maalox and has no prescription for Carafate.  She was given another Depo-Medrol injection earlier this week and was given a prednisone pack to start as well.  She has not started that yet.  Otherwise, there have been no changes to her past medical history, surgical history, family history, or social history.    Review of Systems  Constitutional: Negative.  Negative for chills, fever, malaise/fatigue and weight loss.  HENT: Negative.  Negative for congestion, ear discharge, ear pain and sore throat.   Eyes: Negative for pain, discharge and redness.  Respiratory: Negative for cough, sputum production, shortness of breath and wheezing.   Cardiovascular: Negative.  Negative for chest pain and palpitations.  Gastrointestinal: Negative for abdominal pain, constipation, diarrhea, heartburn, nausea and vomiting.  Skin: Negative.  Negative for itching and rash.  Neurological: Negative for dizziness and headaches.  Endo/Heme/Allergies: Negative for environmental allergies. Does not bruise/bleed easily.       Objective:   Blood pressure 134/88, pulse (!) 107, resp. rate 18, last  menstrual period 12/25/2018, SpO2 98 %. There is no height or weight on file to calculate BMI.   Physical Exam:  Physical Exam  Constitutional: She appears well-developed.  Very anxious female.  He does seem quite fatigued, but her sleep is been interrupted for the last couple of weeks.  HENT:  Head: Normocephalic and atraumatic.  Right Ear: Tympanic membrane, external ear and ear canal normal.  Left Ear: Tympanic membrane, external ear and ear canal normal.  Nose: Mucosal edema present. No rhinorrhea, nasal deformity or septal deviation. No epistaxis. Right sinus exhibits no maxillary sinus tenderness and no frontal sinus tenderness. Left sinus exhibits no maxillary sinus tenderness and no frontal sinus tenderness.  Mouth/Throat: Uvula is midline and oropharynx is clear and moist. Mucous membranes are not pale and not dry.  Eyes: Pupils are equal, round, and reactive to light. Conjunctivae and EOM are normal. Right eye exhibits no chemosis and no discharge. Left eye exhibits no chemosis and no discharge. Right conjunctiva is not injected. Left conjunctiva is not injected.  Cardiovascular: Normal rate, regular rhythm and normal heart sounds.  Respiratory: Effort normal and breath sounds normal. No accessory muscle usage. No tachypnea. No respiratory distress. She has  no wheezes. She has no rhonchi. She has no rales. She exhibits no tenderness.  Clear throughout.  Breathing comfortably on room air.  There is no wheezing at all appreciated.  There is excellent air movement at the bases.  Lymphadenopathy:    She has no cervical adenopathy.  Neurological: She is alert.  Skin: No abrasion, no petechiae and no rash noted. Rash is not papular, not vesicular and not urticarial. No erythema. No pallor.  No eczematous or urticarial lesions noted.  Psychiatric: She has a normal mood and affect.     Diagnostic studies:    Spirometry: results normal (FEV1: 3.45/95%, FVC: 4.26/99%, FEV1/FVC: 81%).     Spirometry consistent with normal pattern.   Allergy Studies: none      Salvatore Marvel, MD  [facility] and [place] [facility] [place]

## 2018-12-29 NOTE — Telephone Encounter (Signed)
I received a call from [facility] requesting to be seen in person. She has been released from the [facility] Department with regards to her COVID infectivity (she is now 18 days out from her test). However she is still having some shortness of breath. She is wondering whether we need to do a spirometry and discuss plans in person.   There is a 3:30 slot but I cannot unblock it. I told her to come in at that time to be seen.  Salvatore Marvel, MD Allergy and [place] [facility] [place]

## 2019-01-01 MED ORDER — CEFDINIR 300 MG PO CAPS: 300.0000 mg | ORAL_CAPSULE | Freq: Two times a day (BID) | ORAL | 0 refills | Status: AC

## 2019-01-01 NOTE — Addendum Note (Signed)
Addended by: Valentina Shaggy on: 01/01/2019 04:23 PM   Modules accepted: Orders

## 2019-01-01 NOTE — Progress Notes (Signed)
Patient contacted me. She is having rebound sinus pain and pressure. She completed the doxycycline from a couple of weeks ago. Cefdinir BID for one week sent in.  Salvatore Marvel, MD Allergy and [place] [facility] [place]

## 2019-01-06 ENCOUNTER — Telehealth: Payer: Self-pay

## 2019-01-06 DIAGNOSIS — G4733 Obstructive sleep apnea (adult) (pediatric): Secondary | ICD-10-CM

## 2019-01-06 NOTE — Telephone Encounter (Signed)
We will set patient up with autoPAP at home, as per her request. Order placed, FU in place for 04/08/19.

## 2019-01-06 NOTE — Telephone Encounter (Signed)
I called pt. I advised pt that Dr. Rexene Alberts reviewed their sleep study results and found that pt has borderline osa. Dr. Rexene Alberts recommends that pt consider auto pap but also can consider weight loss, avoidance of supine sleep or a dental device. Pt would like to start an auto pap. I reviewed PAP compliance expectations with the pt. Pt is agreeable to starting an auto-PAP. I advised pt that an order will be sent to a DME, [employer], and Huey Romans will call the pt within about one week after they file with the pt's insurance. Huey Romans will show the pt how to use the machine, fit for masks, and troubleshoot the auto-PAP if needed. A follow up appt was made for insurance purposes with Amy, NP on 04/08/2019 at 8:00am. Pt verbalized understanding to arrive 15 minutes early and bring their auto-PAP. A letter with all of this information in it will be mailed to the pt as a reminder. I verified with the pt that the address we have on file is correct. Pt verbalized understanding of results. Pt had no questions at this time but was encouraged to call back if questions arise. I have sent the order to [facility] and have received confirmation that they have received the order.

## 2019-01-06 NOTE — Addendum Note (Signed)
Addended by: Star Age on: 01/06/2019 10:17 AM   Modules accepted: Orders

## 2019-01-06 NOTE — Telephone Encounter (Signed)
-----   Message from Star Age, MD sent at 01/06/2019  8:05 AM EDT ----- Patient referred by Dr. Ernst Bowler, seen by me on 12/01/18, HST on 12/29/18.    Please call and notify the patient that the recent home sleep test showed obstructive sleep apnea. OSA is borderline obstructive sleep apnea, with an AHI of 5.1/hour and O2 nadir of 87%. Treatment with positive airway pressure can be considered with autoPAP, if desired by patient. Treatment options otherwise include weight loss and avoidance of the supine sleep position or a dental devise. Please let me know, how she would like to proceed. Given her Sx, we try an autoPAP machine at home, through a DME company. I have not put an order in yet.  Star Age, MD, PhD [facility])

## 2019-01-06 NOTE — Telephone Encounter (Signed)
Referral faxed to [facility]. Received a receipt of confirmation.

## 2019-01-06 NOTE — Progress Notes (Signed)
Patient referred by Dr. Ernst Bowler, seen by me on 12/01/18, HST on 12/29/18.    Please call and notify the patient that the recent home sleep test showed obstructive sleep apnea. OSA is borderline obstructive sleep apnea, with an AHI of 5.1/hour and O2 nadir of 87%. Treatment with positive airway pressure can be considered with autoPAP, if desired by patient. Treatment options otherwise include weight loss and avoidance of the supine sleep position or a dental devise. Please let me know, how she would like to proceed. Given her Sx, we try an autoPAP machine at home, through a DME company. I have not put an order in yet.  Nicole Age, MD, PhD [facility])

## 2019-01-06 NOTE — Procedures (Signed)
    Patient Information     First Name: Nicole Last Name: Mendoza ID: [id number]  Birth Date: 08/17/1990 Age: 28 Gender: Female  Referring Provider: Oren Section, NP-C     Neck Circ.:  N/A Epworth:  13/24   Sleep Study Information     Study Date: Dec 29, 2018 S/H/A Version: [id number] / 4.1.1528 / 43  History:    28 year old woman with a history of allergic rhinitis, asthma, reflux disease, hypertension, and morbid obesity, who reports snoring and excessive daytime sleepiness.  Summary & Diagnosis:     OSA  Recommendations:     This HST shows borderline obstructive sleep apnea, with an AHI of 5.1/hour and O2 nadir of 87%. Treatment with positive airway pressure can be considered with autoPAP, if desired by patient. Treatment options otherwise include weight loss and avoidance of the supine sleep position or a dental devise. These different avenues will be discussed with the patient. The patient will be seen in follow up in sleep clinic, if necessary. Please note, that other causes of the patient's symptoms, including circadian rhythm disturbances, an underlying mood disorder, medication effect and/or an underlying medical problem cannot be ruled out based on this test. Clinical correlation is recommended. The patient should be cautioned not to drive, work at heights, or operate dangerous or heavy equipment when tired or sleepy. Review and reiteration of good sleep hygiene measures should be pursued with any patient. The referring provider will be notified of the test results.   I certify that I have reviewed the raw data recording prior to the issuance of this report in accordance with the standards of the American Academy of Sleep Medicine (AASM).  Star Age, MD, PhD  [facility]) [place], American Board of Psychiatry and Neurology Diplomat, [place] Board of Sleep Medicine                        Start Study Time: End Study Time: Total  Recording Time:  1:56:16 AM 9:30:26 AM      7 h, 34 min  Total Sleep Time % REM of Sleep Time:  6 h, 53 min 28.5    Mean: 97 Minimum: 87 Maximum: 100  Mean of Desaturations Nadirs (%):   92  Oxygen Desaturation. %: 4-9 10-20 >20 Total  Events Number Total  23 100.0  0 0.0  0 0.0  23 100.0  Oxygen Saturation: <90 <=88 <85 <80 <70  Duration (minutes): Sleep % 0.0 0.0 0.0 0.0 0.0 0.0 0.0 0.0 0.0 0.0     Respiratory Indices       Total Events REM NREM All Night  pRDI: pAHI: ODI:  106  35  23 18.4 4.6 1.5 14.2 5.3 4.1 15.4 5.1 3.4       Pulse Rate Statistics during Sleep (BPM)      Mean:  72 Minimum: 54 Maximum: 109    Sleep Summary  Oxygen Saturation Statistics   Indices are calculated using technically valid sleep time of  6 hrs, 52 min. pRDI/pAHI are calculated using oxi desaturations ? 3%       Sleep Stages Chart   * Reference values are according to AASM guidelines

## 2019-01-07 ENCOUNTER — Other Ambulatory Visit: Payer: Self-pay | Admitting: Allergy & Immunology

## 2019-01-07 DIAGNOSIS — J3089 Other allergic rhinitis: Secondary | ICD-10-CM

## 2019-01-07 DIAGNOSIS — J45901 Unspecified asthma with (acute) exacerbation: Secondary | ICD-10-CM

## 2019-01-08 ENCOUNTER — Telehealth: Payer: Self-pay | Admitting: Allergy & Immunology

## 2019-01-08 MED ORDER — NYSTATIN 100000 UNIT/ML MT SUSP: 5.0000 mL | Freq: Four times a day (QID) | OROMUCOSAL | 0 refills | Status: AC

## 2019-01-08 MED ORDER — FLUCONAZOLE 150 MG PO TABS: 150.0000 mg | ORAL_TABLET | ORAL | 1 refills | Status: AC

## 2019-01-08 NOTE — Telephone Encounter (Signed)
Patient contacted me letting me know that she has a yeast infection in her mouth and vaginal area. Script sent in for fluconazole every 48hrs x 3 doses as well as nystatin QID for seven days.   Salvatore Marvel, MD Allergy and [place] [facility] [place]

## 2019-01-12 MED ORDER — PREDNISONE 10 MG PO TABS: ORAL_TABLET | ORAL | 0 refills | Status: DC

## 2019-01-12 NOTE — Telephone Encounter (Signed)
Called and discussed with patient. She had spoken to Dr Ernst Bowler and is scheduled to start [name] in office this Friday.  Will start approval and reach out to her when I submit to pharmacy

## 2019-01-12 NOTE — Telephone Encounter (Signed)
Patient called to let me know that her breathing was worsening with the wean of her steroids. She is not willing to try Performist because she had such tachycardia with the use of Symbicort and Dulera in the past. She remains on the scheduled Xopenex. Symptoms were better when she started her prednisone wean last week.   Given these worsening symptoms, I talked to her about starting an anti-IL5 agent. We would likely go with Nucala since the patient is very nervous about side effects and safety issues. Nucala has the most safety information amongst the anti-IL5 agents.   Last AEC was 200 in October 2019.  If she decides to start, she could come in and get a sample to get her started.   Salvatore Marvel, MD Allergy and [place] [facility] [place]

## 2019-01-12 NOTE — Addendum Note (Signed)
Addended by: Valentina Shaggy on: 01/12/2019 03:57 PM   Modules accepted: Orders

## 2019-01-12 NOTE — Telephone Encounter (Signed)
Prednisone taper over three weeks sent in.   Salvatore Marvel, MD Allergy and [place] [facility] [place]

## 2019-01-15 ENCOUNTER — Other Ambulatory Visit: Payer: Self-pay

## 2019-01-15 ENCOUNTER — Ambulatory Visit (INDEPENDENT_AMBULATORY_CARE_PROVIDER_SITE_OTHER): Payer: Managed Care, Other (non HMO)

## 2019-01-15 DIAGNOSIS — J4550 Severe persistent asthma, uncomplicated: Secondary | ICD-10-CM | POA: Diagnosis not present

## 2019-01-15 MED ORDER — BENRALIZUMAB 30 MG/ML ~~LOC~~ SOSY
30.0000 mg | PREFILLED_SYRINGE | SUBCUTANEOUS | Status: AC
  Administered 2019-01-15: 30 mg via SUBCUTANEOUS

## 2019-01-15 NOTE — Progress Notes (Signed)
Immunotherapy   Patient Details  Name: Nicole Mendoza MRN: [id number] Date of Birth: [birth year]  01/15/2019  [name] started [name] for Severe asthma. Patient received 30 mg. Patient waited in an exam room for 60 minutes with no problems. Frequency: Every 4 weeks x 3 doses then every 8 weeks Epi-Pen: Yes Consent signed and patient instructions given. Patient will self administer at home and knows her next injection is 02/12/2019.   [name] 01/15/2019, 1:44 PM

## 2019-01-16 ENCOUNTER — Other Ambulatory Visit: Payer: Self-pay | Admitting: Allergy & Immunology

## 2019-01-20 ENCOUNTER — Other Ambulatory Visit: Payer: Self-pay | Admitting: Allergy & Immunology

## 2019-01-20 ENCOUNTER — Other Ambulatory Visit: Payer: Self-pay

## 2019-01-20 ENCOUNTER — Telehealth: Payer: Self-pay | Admitting: Allergy & Immunology

## 2019-01-20 MED ORDER — LEVALBUTEROL TARTRATE 45 MCG/ACT IN AERO: 2.0000 | INHALATION_SPRAY | Freq: Four times a day (QID) | RESPIRATORY_TRACT | 1 refills | Status: DC | PRN

## 2019-01-20 MED ORDER — LEVALBUTEROL HCL 1.25 MG/3ML IN NEBU: 1.2500 mg | INHALATION_SOLUTION | Freq: Four times a day (QID) | RESPIRATORY_TRACT | 1 refills | Status: DC | PRN

## 2019-01-20 MED ORDER — MOMETASONE FUROATE 220 MCG/INH IN AEPB: 2.0000 | INHALATION_SPRAY | Freq: Two times a day (BID) | RESPIRATORY_TRACT | 5 refills | Status: DC

## 2019-01-20 NOTE — Telephone Encounter (Signed)
I received a call from [facility] reporting increased GERD symptoms as well as paradoxically worsening asthma symptoms with the Pulmicort. Therefore we will submit a PA for Asmanex 268mcg one puff BID. She has talked to her insurance company and they said they would cover if there was an adverse reaction to Pulmicort.  I am also refilling her Xopenex nebulizer solution.   Salvatore Marvel, MD Allergy and [place] [facility] [place]

## 2019-01-22 ENCOUNTER — Other Ambulatory Visit: Payer: Self-pay

## 2019-01-22 ENCOUNTER — Ambulatory Visit (INDEPENDENT_AMBULATORY_CARE_PROVIDER_SITE_OTHER): Payer: Managed Care, Other (non HMO) | Admitting: Allergy & Immunology

## 2019-01-22 ENCOUNTER — Encounter: Payer: Self-pay | Admitting: Allergy & Immunology

## 2019-01-22 DIAGNOSIS — J45901 Unspecified asthma with (acute) exacerbation: Secondary | ICD-10-CM

## 2019-01-22 DIAGNOSIS — B999 Unspecified infectious disease: Secondary | ICD-10-CM | POA: Diagnosis not present

## 2019-01-22 DIAGNOSIS — J988 Other specified respiratory disorders: Secondary | ICD-10-CM

## 2019-01-22 DIAGNOSIS — T7800XD Anaphylactic reaction due to unspecified food, subsequent encounter: Secondary | ICD-10-CM

## 2019-01-22 DIAGNOSIS — T7800XA Anaphylactic reaction due to unspecified food, initial encounter: Secondary | ICD-10-CM | POA: Insufficient documentation

## 2019-01-22 DIAGNOSIS — U071 COVID-19: Secondary | ICD-10-CM | POA: Diagnosis not present

## 2019-01-22 DIAGNOSIS — J4540 Moderate persistent asthma, uncomplicated: Secondary | ICD-10-CM

## 2019-01-22 DIAGNOSIS — K219 Gastro-esophageal reflux disease without esophagitis: Secondary | ICD-10-CM

## 2019-01-22 DIAGNOSIS — J3089 Other allergic rhinitis: Secondary | ICD-10-CM

## 2019-01-22 DIAGNOSIS — J302 Other seasonal allergic rhinitis: Secondary | ICD-10-CM

## 2019-01-22 MED ORDER — XHANCE 93 MCG/ACT NA EXHU: 2.0000 | INHALANT_SUSPENSION | Freq: Two times a day (BID) | NASAL | 5 refills | Status: DC

## 2019-01-22 MED ORDER — DOXYCYCLINE HYCLATE 100 MG PO TBEC: 100.0000 mg | DELAYED_RELEASE_TABLET | Freq: Two times a day (BID) | ORAL | 0 refills | Status: AC

## 2019-01-22 MED ORDER — EPINEPHRINE 0.3 MG/0.3ML IJ SOAJ: INTRAMUSCULAR | 1 refills | Status: DC

## 2019-01-22 NOTE — Progress Notes (Signed)
Per Dr. Gillermina Hu request I am sending Ihor Austin to [place] to enroll patient in [facility] program.

## 2019-01-22 NOTE — Progress Notes (Signed)
RE: Nicole Mendoza MRN: [id number] DOB: July 13, 1990 Date of Telemedicine Visit: 01/22/2019  Referring provider: Rubie Maid, MD Primary care provider: Rubie Maid, MD  Chief Complaint: Sinusitis (sinus pressure, sinus drainage. she has started using a CPAP machine and wonders if this could be contributing to her symptoms. she did start coughing up some yellow phlegm last night for the first time. no nasal drainage, just feels inflammed and stuffy. )   Telemedicine Follow Up Visit via Telephone: I connected with Nicole Mendoza for a follow up on 01/22/19 by telephone and verified that I am speaking with the correct person using two identifiers.   I discussed the limitations, risks, security and privacy concerns of performing an evaluation and management service by telephone and the availability of in person appointments. I also discussed with the patient that there may be a patient responsible charge related to this service. The patient expressed understanding and agreed to proceed.  Patient is at the grocery store accompanied by her mother who provided/contributed to the history.  Provider is at the office.  Visit start time: 9:13 AM Visit end time: 9:40 AM Insurance consent/check in by: Anderson [facility] Medical consent and medical assistant/nurse: Kayla  History of Present Illness:  She is a 28 y.o. female, who is being followed for mild persistent asthma as well as seasonal and perennial allergic rhinitis and recurrent infections and food allergies. Her previous allergy office visit was in July 2020 with myself.  At that time, we continued her on Zyrtec 10 mg daily, montelukast 10 mg daily, and Flonase 2 sprays per nostril daily.  Recommended continued avoidance of tree nuts and peanuts.  We did provide her with a refill on her Auvi-Q.  Her lung testing looks fantastic.  There was no evidence of pneumonia when I was listening to her lungs.  I recommended that she go ahead and start the  Perforomist twice daily and Pulmicort twice daily via the nebulizer and do this for 2 weeks.  We are doing this as a means to help her heal from her COVID-19.  We also felt that her reflux had worsened since she had been on steroids for such a long period of time.  We continued her on Protonix twice daily as well as Pepcid 20 mg twice daily for a month.  We also added on Carafate 3 times daily.  We plan to do the Carafate and the Pepcid for only 1 month as she weaned off of her prednisone.  In the interim, on July 28, she contacted me to let me know that her breathing and worsen.  And always tended to worsen when she was weaning off of her prednisone, so I sent in a longer prednisone taper over the course of 3 weeks.  As a means of asthma control and steroid sparing, we added on Fasenra to help improve her breathing.  She did start this 1 week ago on July 31.  We had her in the office for her first visit due to her concern for having a reaction.  Since last visit a week ago, she has mostly done well.  However, she contacted me to let me know that she was having increased sinus drainage.  She was concerned she was developing a sinus infection.  Therefore, to discuss these and other concerns, we had her schedule a phone visit. Antibiotic wise, she has received a course of doxycycline and Omnicef within the last 6 to 8 weeks.  Whenever she tapers down from the prednisone,  she tends to have rebound congestion. Friday night, she tried her CPAP and the mask was too tight. She cranked up the humidity to help with her stuffiness. Around 5am, she had a face full of water. This was Saturday morning. Then off and on she has had sinus pressure and drainage. She has not had a fever to her knowledge. She did check a couple of times and it was normal. She is unsure whether this is related to the CPAP versus a continued side effect from COVID.  She is on the Flonase. She has been on this for years. She has not tried  anything else at all. She does not think that Astelin or Patanase sounds familiar. She has never been on [place]. She has not been using Afrin at all.  She does report that she has having some green mucus discharge as well as sinus pain and pressure.  Breathing wise, she has done very well.  She is much better than when she was in the midst of her COVID infection.  We recently changed her from Pulmicort to Asmanex since she was having bad reactions to the Pulmicort.  We did have to fill out a prior authorization for her insurance company.  We have tried to get her on a long-acting beta agonist, but she has refused due to a history of tachycardia.  She also reads the side effects of all of her medications and is easily concerned.  She has had increased infections, although these have all been post infection with coronavirus.  However, she has met her deductible this year and would like to just complete her immune work-up that we have been putting off.  Otherwise, there have been no changes to her past medical history, surgical history, family history, or social history.  Assessment and Plan:  [name] is a 28 y.o. female with:  Mild persistent asthma-complicated now by a [id number] swab 12/13/18 (18 days out)  Recurrent infections- improved following Pneumovax (protection to 3/23 serotypes initially to 16/23 serotypes post-vaccination of Pneumococcus), but now with worsening frequency of infections.  Seasonal and perennial allergic rhinitis(trees, weeds, grasses and dust mites)  Gastroesophageal reflux disease- on pantoprazole in conjunction with one month of Pepcid and Carafate)   Adverse food reactions - improved with minimizing exposure to food additives and avoiding peanuts/tree nuts  Snoring with perceived poor sleep quality - just started CPAP last week   1. Seasonal and perennial allergic rhinitis (trees, weeds, grasses and dust mites) - Stop taking: Flonase (fluticasone)  - Continue with: Zyrtec (cetirizine) 79m tablet once daily, Singulair (montelukast) 165mdaily  -Start taking: Xhance two sprays per nostril twice daily. - Watch the video to learn how to use it: https://www.xhDealExplorer.be Pick [facility] a sample [facility] the [facility] today.  - You can use one spray of Afrin in the evenings, followed by the [facility]- This should help keep your nasal passages open and make the CPAP more tolerate.   2. Acute sinusitis - With your current symptoms and time course, antibiotics might be needed: doxycycline 10026mwice daily for 10 days - We are going to go ahead and get labs to complete your immune workup. - Go to a [facility] to get these done.   3. Adverse food reaction (tree nuts, peanuts)  - Continue to avoid peanuts and tree nuts. - Continue to avoid preservatives as you are able.  - AWynona Luna up to date.   4. Mild persistent asthma - with COVID19 positive swab  12/13/18 - Continue the prednisone wean. - We sent in the Asmanex script. - Daily controller medication(s): Asmanex 229mg one puff twice daily + Singulair 126m - Prior to physical activity: ProAir 2 puffs 10-15 minutes before physical activity. - Rescue medications: ProAir 4 puffs every 4-6 hours as needed - Changes during respiratory infections or worsening symptoms: Increase Asmanex 22062mto 2 puffs twice daily for ONE TO TWO WEEKS. - Asthma control goals:  * Full participation in all desired activities (may need albuterol before activity) * Albuterol use two time or less a week on average (not counting use with activity) * Cough interfering with sleep two time or less a month * Oral steroids no more than once a year * No hospitalizations  5. Reflux - worsened by all of the steroids that you have been on - Continue with Protonix twice daily. - Continue with Pepcid 37m58mice daily for one month total.  - Continue with Carafate three times daily for one month total.   6. Return in about 3  months (around 04/24/2019). This can be an in-person, a virtual Webex or a telephone follow up visit.    Diagnostics: None.  Medication List:  Current Outpatient Medications  Medication Sig Dispense Refill  . cetirizine (ZYRTEC) 10 MG tablet Take by mouth.    . cloNIDine (CATAPRES) 0.1 MG tablet TAKE 1 TABLET BY MOUTH 2 TIMES DAILY AS NEEDED.    . EPMarland KitchenNEPHrine (AUVI-Q) 0.3 mg/0.3 mL IJ SOAJ injection Use as directed for severe allergic reactions 4 each 1  . famotidine (PEPCID) 20 MG tablet Take 1 tablet (20 mg total) by mouth 2 (two) times daily. 60 tablet 3  . FASENRA PEN 30 MG/ML SOAJ     . fluticasone (FLONASE) 50 MCG/ACT nasal spray Place 2 sprays into both nostrils daily. 48 g 2  . hydrochlorothiazide (HYDRODIURIL) 25 MG tablet Take 25 mg by mouth.    . hydrOXYzine (ATARAX/VISTARIL) 25 MG tablet TAKE 1-2 TABLETS AS NEEDED FOR ANXIETY. 180 tablet 0  . levalbuterol (XOPENEX HFA) 45 MCG/ACT inhaler Inhale 2 puffs into the lungs every 6 (six) hours as needed for wheezing. 1 Inhaler 1  . levalbuterol (XOPENEX) 1.25 MG/3ML nebulizer solution Take 1.25 mg by nebulization every 6 (six) hours as needed for wheezing. 72 mL 1  . metoprolol succinate (TOPROL-XL) 100 MG 24 hr tablet Take 100 mg by mouth daily. Take with or immediately following a meal.    . montelukast (SINGULAIR) 10 MG tablet TAKE 1 TABLET BY MOUTH EVERY DAY 30 tablet 2  . norethindrone (ORTHO MICRONOR) 0.35 MG tablet Take 0.35 mg by mouth.    . ondansetron (ZOFRAN) 4 MG tablet Take 4 mg by mouth every 8 (eight) hours as needed for nausea or vomiting.    . pantoprazole (PROTONIX) 40 MG tablet Take 40 mg by mouth daily.  3  . potassium chloride SA (K-DUR) 20 MEQ tablet Take 1 tablet (20 mEq total) by mouth 2 (two) times daily for 5 days. 10 tablet 0  . predniSONE (DELTASONE) 10 MG tablet Take two tablets twice daily x7 days, one tablet twice daily x7 days, one tablet once daily x 1 day, then STOP. 50 tablet 0  . sucralfate  (CARAFATE) 1 g tablet Take 2 tablets (2 g total) by mouth 4 (four) times daily -  with meals and at bedtime. 240 tablet 0  . doxycycline (DORYX) 100 MG EC tablet Take 1 tablet (100 mg total) by mouth 2 (two) times daily for  10 days. 20 tablet 0  . Fluticasone Propionate (XHANCE) 93 MCG/ACT EXHU Place 2 sprays into both nostrils 2 (two) times daily. 32 mL 5   Current Facility-Administered Medications  Medication Dose Route Frequency Provider Last Rate Last Dose  . Benralizumab SOSY 30 mg  30 mg Subcutaneous Q28 days Valentina Shaggy, MD   30 mg at 01/15/19 1350   Allergies: Allergies  Allergen Reactions  . Budesonide-Formoterol Fumarate Other (See Comments)    Causes Hypertension  . Penicillins Hives  . Albuterol Palpitations  . Losartan Rash   I reviewed her past medical history, social history, family history, and environmental history and no significant changes have been reported from previous visits.  Review of Systems  Constitutional: Negative for activity change, appetite change, chills, fatigue and fever.  HENT: Positive for congestion, postnasal drip, rhinorrhea, sinus pain and sore throat. Negative for sinus pressure.   Eyes: Negative for pain, discharge, redness and itching.  Respiratory: Negative for shortness of breath, wheezing and stridor.   Gastrointestinal: Negative for constipation, diarrhea, nausea and vomiting.  Musculoskeletal: Negative for arthralgias, joint swelling and myalgias.  Skin: Negative for rash.  Allergic/Immunologic: Negative for environmental allergies and food allergies.    Objective:  Physical exam not obtained as encounter was done via telephone.   Previous notes and tests were reviewed.  I discussed the assessment and treatment plan with the patient. The patient was provided an opportunity to ask questions and all were answered. The patient agreed with the plan and demonstrated an understanding of the instructions.   The patient was  advised to call back or seek an in-person evaluation if the symptoms worsen or if the condition fails to improve as anticipated.  I provided 27 minutes of non-face-to-face time during this encounter.  It was my pleasure to participate in [place] care today. Please feel free to contact me with any questions or concerns.   Sincerely,  Valentina Shaggy, MD

## 2019-01-22 NOTE — Patient Instructions (Addendum)
1. Seasonal and perennial allergic rhinitis (trees, weeds, grasses and dust mites) - Stop taking: Flonase (fluticasone) - Continue with: Zyrtec (cetirizine) 10mg  tablet once daily, Singulair (montelukast) 10mg  daily  -Start taking: Xhance two sprays per nostril twice daily. - Watch the video to learn how to use it: https://www.DealExplorer.be - Pick up a sample at the [employer] office today.  - You can use one spray of Afrin in the evenings, followed by the [facility]. - This should help keep your nasal passages open and make the CPAP more tolerate.   2. Acute sinusitis - With your current symptoms and time course, antibiotics might be needed: doxycycline 100mg  twice daily for 10 days - We are going to go ahead and get labs to complete your immune workup. - Go to a [facility] to get these done.   3. Adverse food reaction (tree nuts, peanuts)  - Continue to avoid peanuts and tree nuts. - Continue to avoid preservatives as you are able.  Nicole Mendoza is up to date.   4. Mild persistent asthma - with COVID19 positive swab 12/13/18 - Continue the prednisone wean. - We sent in the Asmanex script. - Daily controller medication(s): Asmanex 258mcg one puff twice daily + Singulair 10mg . - Prior to physical activity: ProAir 2 puffs 10-15 minutes before physical activity. - Rescue medications: ProAir 4 puffs every 4-6 hours as needed - Changes during respiratory infections or worsening symptoms: Increase Asmanex 242mcg to 2 puffs twice daily for ONE TO TWO WEEKS. - Asthma control goals:  * Full participation in all desired activities (may need albuterol before activity) * Albuterol use two time or less a week on average (not counting use with activity) * Cough interfering with sleep two time or less a month * Oral steroids no more than once a year * No hospitalizations  5. Reflux - worsened by all of the steroids that you have been on - Continue with Protonix twice daily. - Continue with Pepcid 20mg  twice  daily for one month total.  - Continue with Carafate three times daily for one month total.   6. Return in about 3 months (around 04/24/2019). This can be an in-person, a virtual Webex or a telephone follow up visit.   Please inform us of any Emergency Department visits, hospitalizations, or changes in symptoms. Call us before going to the ED for breathing or allergy symptoms since we might be able to fit you in for a sick visit. Feel free to contact us anytime with any questions, problems, or concerns.  It was a pleasure to talk to you today today!  Websites that have reliable patient information: 1. American Academy of Asthma, Allergy, and Immunology: www.aaaai.org 2. Food Allergy Research and Education (FARE): foodallergy.org 3. Mothers of Asthmatics: http://www.asthmacommunitynetwork.org 4. American College of Allergy, Asthma, and Immunology: www.acaai.org  "Like" [place] on Facebook and Instagram for our latest updates!      Make sure you are registered to vote! If you have moved or changed any of your contact information, you will need to get this updated before voting!  In some cases, you MAY be able to register to vote online: [email or web address]    Voter ID laws are NOT going into effect for the General Election in November 2020! DO NOT let this stop you from exercising your right to vote!   Absentee voting is the SAFEST way to vote during the coronavirus pandemic!   Download and print an absentee ballot request form at [email or web address] or you can scan  the QR code below with your smart phone:      More information on absentee ballots can be found here: [email or web address]

## 2019-01-27 ENCOUNTER — Other Ambulatory Visit: Payer: Self-pay

## 2019-01-27 ENCOUNTER — Telehealth: Payer: Self-pay | Admitting: Allergy & Immunology

## 2019-01-27 MED ORDER — SUCRALFATE 1 G PO TABS: 2.0000 g | ORAL_TABLET | Freq: Three times a day (TID) | ORAL | 0 refills | Status: DC

## 2019-01-27 NOTE — Telephone Encounter (Signed)
Pt called and wanted to know how it was going for her to get the asmanex . She try the pulmicort and it did not work. So she needs to know what is going on. [facility] [phone].

## 2019-01-27 NOTE — Progress Notes (Signed)
Medication request for Sucralfate 1 GM. Last written on 12/29/2018

## 2019-01-27 NOTE — Progress Notes (Signed)
Noted  

## 2019-01-27 NOTE — Progress Notes (Signed)
Yes that is fine to refill.  Salvatore Marvel, MD [facility] [place]

## 2019-01-28 NOTE — Telephone Encounter (Signed)
PA approved. Patient informed of approval. [facility]

## 2019-01-28 NOTE — Telephone Encounter (Signed)
Never [facility]. [facility] and it was placed in inactive for an unknown reason. Pharmacist tried to fill Asmanex. Faxed over PA request. Submitted PA via CoverMymeds. Patient has failed Pulmicort, Qvar and Flovent previously.

## 2019-01-28 NOTE — Telephone Encounter (Signed)
Tried calling patient to let her know PA has been submitted but mailbox is full and unable to leave message.

## 2019-01-29 LAB — LYMPH ENUMERATION, BASIC & NK CELLS
% CD 3 Pos. Lymph.: 69.9 % (ref 57.5–86.2)
% CD 4 Pos. Lymph.: 42.5 % (ref 30.8–58.5)
% NK (CD56/16): 9.4 % (ref 1.4–19.4)
Ab NK (CD56/16): 160 /uL (ref 24–406)
Absolute CD 3: 1188 /uL (ref 622–2402)
Absolute CD 4 Helper: 723 /uL (ref 359–1519)
Basophils Absolute: 0 10*3/uL (ref 0.0–0.2)
Basos: 0 %
CD19 % B Cell: 20.7 % (ref 3.3–25.4)
CD19 Abs: 352 /uL (ref 12–645)
CD4/CD8 Ratio: 1.66 (ref 0.92–3.72)
CD8 % Suppressor T Cell: 25.6 % (ref 12.0–35.5)
CD8 T Cell Abs: 435 /uL (ref 109–897)
EOS (ABSOLUTE): 0 10*3/uL (ref 0.0–0.4)
Eos: 0 %
Hematocrit: 40.3 % (ref 34.0–46.6)
Hemoglobin: 13.5 g/dL (ref 11.1–15.9)
Immature Grans (Abs): 0.2 10*3/uL — ABNORMAL HIGH (ref 0.0–0.1)
Immature Granulocytes: 1 %
Lymphocytes Absolute: 1.7 10*3/uL (ref 0.7–3.1)
Lymphs: 8 %
MCH: 27.3 pg (ref 26.6–33.0)
MCHC: 33.5 g/dL (ref 31.5–35.7)
MCV: 81 fL (ref 79–97)
Monocytes Absolute: 1.2 10*3/uL — ABNORMAL HIGH (ref 0.1–0.9)
Monocytes: 6 %
Neutrophils Absolute: 17.8 10*3/uL — ABNORMAL HIGH (ref 1.4–7.0)
Neutrophils: 85 %
Platelets: 380 10*3/uL (ref 150–450)
RBC: 4.95 x10E6/uL (ref 3.77–5.28)
RDW: 14.6 % (ref 11.7–15.4)
WBC: 20.9 10*3/uL (ref 3.4–10.8)

## 2019-01-29 LAB — STREP PNEUMONIAE 23 SEROTYPES IGG
Pneumo Ab Type 1*: 10.5 ug/mL (ref >1.3)
Pneumo Ab Type 12 (12F)*: 22.5 ug/mL (ref >1.3)
Pneumo Ab Type 14*: >29.1 ug/mL (ref >1.3)
Pneumo Ab Type 17 (17F)*: 0.8 ug/mL — ABNORMAL LOW (ref >1.3)
Pneumo Ab Type 19 (19F)*: 9.7 ug/mL (ref >1.3)
Pneumo Ab Type 2*: 9.1 ug/mL (ref >1.3)
Pneumo Ab Type 20*: 21.2 ug/mL (ref >1.3)
Pneumo Ab Type 22 (22F)*: 2.6 ug/mL (ref >1.3)
Pneumo Ab Type 23 (23F)*: 1 ug/mL — ABNORMAL LOW (ref >1.3)
Pneumo Ab Type 26 (6B)*: 1.1 ug/mL — ABNORMAL LOW (ref >1.3)
Pneumo Ab Type 3*: 11.1 ug/mL (ref >1.3)
Pneumo Ab Type 34 (10A)*: 22.2 ug/mL (ref >1.3)
Pneumo Ab Type 4*: 1.6 ug/mL (ref >1.3)
Pneumo Ab Type 43 (11A)*: 6.8 ug/mL (ref >1.3)
Pneumo Ab Type 5*: 2 ug/mL (ref >1.3)
Pneumo Ab Type 51 (7F)*: 5 ug/mL (ref >1.3)
Pneumo Ab Type 54 (15B)*: 11.3 ug/mL (ref >1.3)
Pneumo Ab Type 56 (18C)*: 0.3 ug/mL — ABNORMAL LOW (ref >1.3)
Pneumo Ab Type 57 (19A)*: 3.6 ug/mL (ref >1.3)
Pneumo Ab Type 68 (9V)*: 15 ug/mL (ref >1.3)
Pneumo Ab Type 70 (33F)*: 2.4 ug/mL (ref >1.3)
Pneumo Ab Type 8*: 4.3 ug/mL (ref >1.3)
Pneumo Ab Type 9 (9N)*: 4.2 ug/mL (ref >1.3)

## 2019-01-29 LAB — B CELL SUBSET ANALYSIS

## 2019-01-29 LAB — IGG, IGA, IGM
IgA/Immunoglobulin A, Serum: 175 mg/dL (ref 87–352)
IgM (Immunoglobulin M), Srm: 84 mg/dL (ref 26–217)

## 2019-01-29 LAB — IGG 1, 2, 3, AND 4
IgG (Immunoglobin G), Serum: 1031 mg/dL (ref 586–1602)
IgG, Subclass 1: 655 mg/dL (ref 248–810)
IgG, Subclass 2: 265 mg/dL (ref 130–555)
IgG, Subclass 3: 49 mg/dL (ref 15–102)
IgG, Subclass 4: 47 mg/dL (ref 2–96)

## 2019-01-29 LAB — DIPHTHERIA / TETANUS ANTIBODY PANEL
Diphtheria Ab: 0.16 IU/mL (ref <0.10)
Tetanus Ab, IgG: 0.17 IU/mL (ref <0.10)

## 2019-02-05 ENCOUNTER — Telehealth: Payer: Self-pay | Admitting: Allergy & Immunology

## 2019-02-05 NOTE — Telephone Encounter (Signed)
Patient contacted me and requested referral to another ENT.  Apparently her current ENT canceled her appointment on the same day and she is requesting a complete second opinion.  Salvatore Marvel, MD [facility] [place] [facility] [place]

## 2019-02-11 ENCOUNTER — Other Ambulatory Visit: Payer: Self-pay

## 2019-02-12 MED ORDER — SUCRALFATE 1 G PO TABS: 2.0000 g | ORAL_TABLET | Freq: Three times a day (TID) | ORAL | 0 refills | Status: DC

## 2019-02-12 NOTE — Progress Notes (Signed)
Sure let's refill one more time for her. She has been on so many steroids lately.   Salvatore Marvel, MD [facility] [name] [facility] [place]

## 2019-02-12 NOTE — Telephone Encounter (Signed)
Hey did you ever hear back from the patient about what she is needing to be seen for ?

## 2019-02-12 NOTE — Telephone Encounter (Signed)
Chronic sinusitis.  Salvatore Marvel, MD [facility] [place] [facility] [place]

## 2019-02-13 ENCOUNTER — Other Ambulatory Visit: Payer: Self-pay | Admitting: Allergy & Immunology

## 2019-02-15 NOTE — Telephone Encounter (Signed)
Per Dr. Ernst Bowler, Sinusitis.

## 2019-02-17 NOTE — Telephone Encounter (Signed)
Referral has been placed to Dr Velvet Bathe office.

## 2019-02-17 NOTE — Telephone Encounter (Signed)
Thanks, Dee!!   Rayah Fines, MD [facility] [place]  

## 2019-02-18 ENCOUNTER — Ambulatory Visit (INDEPENDENT_AMBULATORY_CARE_PROVIDER_SITE_OTHER): Payer: Managed Care, Other (non HMO) | Admitting: *Deleted

## 2019-02-18 DIAGNOSIS — J4550 Severe persistent asthma, uncomplicated: Secondary | ICD-10-CM | POA: Diagnosis not present

## 2019-03-02 ENCOUNTER — Ambulatory Visit: Payer: Self-pay | Admitting: Allergy & Immunology

## 2019-03-12 ENCOUNTER — Other Ambulatory Visit: Payer: Self-pay | Admitting: *Deleted

## 2019-03-12 MED ORDER — LEVALBUTEROL HCL 1.25 MG/3ML IN NEBU: INHALATION_SOLUTION | RESPIRATORY_TRACT | 1 refills | Status: DC

## 2019-03-17 ENCOUNTER — Other Ambulatory Visit: Payer: Self-pay | Admitting: Allergy & Immunology

## 2019-04-06 ENCOUNTER — Ambulatory Visit (INDEPENDENT_AMBULATORY_CARE_PROVIDER_SITE_OTHER): Payer: Managed Care, Other (non HMO) | Admitting: Allergy & Immunology

## 2019-04-06 ENCOUNTER — Encounter: Payer: Self-pay | Admitting: Allergy & Immunology

## 2019-04-06 ENCOUNTER — Other Ambulatory Visit: Payer: Self-pay

## 2019-04-06 VITALS — BP 150/92 | HR 98 | Temp 98.3°F | Resp 18

## 2019-04-06 DIAGNOSIS — T7800XD Anaphylactic reaction due to unspecified food, subsequent encounter: Secondary | ICD-10-CM | POA: Diagnosis not present

## 2019-04-06 DIAGNOSIS — J3089 Other allergic rhinitis: Secondary | ICD-10-CM

## 2019-04-06 DIAGNOSIS — J302 Other seasonal allergic rhinitis: Secondary | ICD-10-CM

## 2019-04-06 DIAGNOSIS — J4540 Moderate persistent asthma, uncomplicated: Secondary | ICD-10-CM | POA: Diagnosis not present

## 2019-04-06 DIAGNOSIS — J329 Chronic sinusitis, unspecified: Secondary | ICD-10-CM | POA: Diagnosis not present

## 2019-04-06 NOTE — Patient Instructions (Addendum)
1. Seasonal and perennial allergic rhinitis (trees, weeds, grasses and dust mites) - Continue with: Zyrtec (cetirizine) 10mg  tablet once daily, Singulair (montelukast) 10mg  daily + Xhance two sprays per nostril twice daily. - We are going to refer you to ENT on [address] instead.   2. Acute sinusitis - With your current symptoms and time course, antibiotics are needed: cefdinir 300mg  twice daily for ten days  - Consider talking to Neurology about migraines.   3. Adverse food reaction (tree nuts, peanuts)  - Continue to avoid peanuts and tree nuts. - Continue to avoid preservatives as you are able.  Wynona Luna is up to date.   4. Mild persistent asthma - with COVID19 positive swab 12/13/18 - Lung function looks excellent.  - Daily controller medication(s): Asmanex 211mcg one puff twice daily + Singulair 10mg  + Fasenra every 8 weeks - Prior to physical activity: ProAir 2 puffs 10-15 minutes before physical activity. - Rescue medications: ProAir 4 puffs every 4-6 hours as needed - Changes during respiratory infections or worsening symptoms: Increase Asmanex 275mcg to 2 puffs twice daily for ONE TO TWO WEEKS. - Asthma control goals:  * Full participation in all desired activities (may need albuterol before activity) * Albuterol use two time or less a week on average (not counting use with activity) * Cough interfering with sleep two time or less a month * Oral steroids no more than once a year * No hospitalizations  5. Reflux  - Continue with Protonix 1-2 times daily. - Continue with Pepcid 20mg  1-2 daily.  6. Return in about 4 months (around 08/07/2019). This can be an in-person, a virtual Webex or a telephone follow up visit.   Please inform us of any Emergency Department visits, hospitalizations, or changes in symptoms. Call us before going to the ED for breathing or allergy symptoms since we might be able to fit you in for a sick visit. Feel free to contact us anytime with any  questions, problems, or concerns.  It was a pleasure to see you again today!  Websites that have reliable patient information: 1. American Academy of Asthma, Allergy, and Immunology: www.aaaai.org 2. Food Allergy Research and Education (FARE): foodallergy.org 3. Mothers of Asthmatics: http://www.asthmacommunitynetwork.org 4. American College of Allergy, Asthma, and Immunology: www.acaai.org  "Like" [place] on Facebook and Instagram for our latest updates!      Make sure you are registered to vote! If you have moved or changed any of your contact information, you will need to get this updated before voting!  In some cases, you MAY be able to register to vote online: [email or web address]    Voter ID laws are NOT going into effect for the General Election in November 2020! DO NOT let this stop you from exercising your right to vote!   Absentee voting is the SAFEST way to vote during the coronavirus pandemic!   Download and print an absentee ballot request form at [email or web address] or you can scan the QR code below with your smart phone:      More information on absentee ballots can be found here: [email or web address]  [place] VOTING SITES have been established! You can register to vote and cast your vote on the same day at these locations. See this site for more information on what you need to register to vote: [email or web address]

## 2019-04-06 NOTE — Progress Notes (Signed)
FOLLOW UP  Date of Service/Encounter:  04/06/19   Assessment:   Mild persistent asthma-complicated now by a [id number] swab 12/13/18  Recurrent infections- improved following Pneumovax (protection to 3/23 serotypes initially to 16/23 serotypes post-vaccination of Pneumococcus), but now with worsening frequency of infections  Seasonal and perennial allergic rhinitis(trees, weeds, grasses and dust mites)  Gastroesophageal reflux disease- onpantoprazole in conjunction with one month of Pepcid and Carafate)  Adverse food reactions - improved with minimizing exposure to food additives and avoiding peanuts/tree nuts  Snoring with perceived poor sleep quality- improved with use of the CPAP  Possible migraines ?  Plan/Recommendations:   1. Seasonal and perennial allergic rhinitis (trees, weeds, grasses and dust mites) - Continue with: Zyrtec (cetirizine) 10mg  tablet once daily, Singulair (montelukast) 10mg  daily + Xhance two sprays per nostril twice daily. - We are going to refer you to ENT on [address] instead.   2. Acute sinusitis - With your current symptoms and time course, antibiotics are needed: cefdinir 300mg  twice daily for ten days  - Consider talking to Neurology about migraines.   3. Adverse food reaction (tree nuts, peanuts)  - Continue to avoid peanuts and tree nuts. - Continue to avoid preservatives as you are able.  Wynona Luna is up to date.   4. Mild persistent asthma - with COVID19 positive swab 12/13/18 - Lung function looks excellent.  - Daily controller medication(s): Asmanex 284mcg one puff twice daily + Singulair 10mg  + Fasenra every 8 weeks - Prior to physical activity: ProAir 2 puffs 10-15 minutes before physical activity. - Rescue medications: ProAir 4 puffs every 4-6 hours as needed - Changes during respiratory infections or worsening symptoms: Increase Asmanex 259mcg to 2 puffs twice daily for ONE TO TWO WEEKS. - Asthma control  goals:  * Full participation in all desired activities (may need albuterol before activity) * Albuterol use two time or less a week on average (not counting use with activity) * Cough interfering with sleep two time or less a month * Oral steroids no more than once a year * No hospitalizations  5. Reflux  - Continue with Protonix 1-2 times daily. - Continue with Pepcid 20mg  1-2 daily.  6. Return in about 4 months (around 08/07/2019). This can be an in-person, a virtual Webex or a telephone follow up visit.   Subjective:   Avonda Lux is a 27 y.o. female presenting today for follow up of  Chief Complaint  Patient presents with  . Follow-up  . Sinusitis    Tasha Chock has a history of the following: Patient Active Problem List   Diagnosis Date Noted  . 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 Seasonal and perennial allergic rhinitis 01/22/2019  . Mild persistent asthma without complication 0000000  . Essential hypertension 04/22/2017  . Gastroesophageal reflux disease without esophagitis 12/22/2015  . Current use of beta blocker 12/22/2015  . Asthma with acute exacerbation 05/09/2015  . Acute maxillary sinusitis 05/09/2015  . Hypertension 05/09/2015  . Allergic rhinitis 05/09/2015    History obtained from: chart review and patient.  Anakarina is a 28 y.o. female presenting for a follow up visit.  She was last seen in August 2020 over the phone.  At that time, we stopped her Flonase and continued with Zyrtec and Singulair.  We started her Xhance 2 sprays per nostril twice daily.  We did treat her with doxycycline for sinusitis.  We did complete her immune work-up as well.  We recommended avoidance  of peanuts and tree nuts as well as the preservatives.  For her asthma, we continued a prednisone wean and sent in Asmanex 220 mcg 1 puff twice daily.  We did continue her on Fasenra, which she started on July 31.  Since the last visit, she has mostly done well. She  did have two weeks of sinus congestion and sinus headache. She has been doing this daily for the last two weeks. Her symptoms went from 9/10 to 5/10. She has been living on Tylenol and Sinus.  While the sinus rinses have been helping, her symptoms have not completely resolved.  This is the first infection since I saw her last time.  Of note, at the last visit, we did do an immune work-up which showed an elevated white blood cell count with normal percentages of B and T cells.  She tells me today that she did have a repeat CBC that showed a normalized white blood count 1 week later.  Her breathing seems to be going well with the use of Fasenra.  She has spaced out to every 2 months.  She does tell me today that she has a reaction consisting of chills, which occurs 2 weeks after the injection consistently.  This lasted for about 1 day.  It is certainly not enough to cause her to stop using it.  She has had no other systemic reactions to the [place].  She remains on the Asmanex 1 puff twice daily.  She has not been using her rescue inhaler much at all.  ACT score is 23, indicating excellent asthma control.  She does tell me that she still has episodes of shortness of breath and she is not sure how to determine whether this is asthma related or something else such as being "too fat".  She has not tried using Xopenex when she has these episodes, which would be instructive.  She remains on her cetirizine and montelukast for her environmental allergies.  She remains on the [facility] as well, which is providing excellent control of her mucus production.  She has never felt 100% with regards to her sinuses, but she is better than where she has been recently.  We never did do the sinus CT at the last visit because we wanted to address the sleepiness first.  She has since started the CPAP and reports that she has not had any afternoon naps in quite some time.  Her energy level is much better than it used to be.  She has  never been evaluated for migraines, but if the sinus CT is normal we should certainly consider that possibility.  She was not aware that migraines can present with sinus pressure.  Otherwise, there have been no changes to her past medical history, surgical history, family history, or social history.    Review of Systems  Constitutional: Negative.  Negative for chills, fever, malaise/fatigue and weight loss.  HENT: Negative.  Negative for congestion, ear discharge and ear pain.   Eyes: Negative for pain, discharge and redness.  Respiratory: Negative for cough, sputum production, shortness of breath and wheezing.   Cardiovascular: Negative.  Negative for chest pain and palpitations.  Gastrointestinal: Negative for abdominal pain, constipation, diarrhea, heartburn, nausea and vomiting.  Skin: Negative.  Negative for itching and rash.  Neurological: Negative for dizziness and headaches.  Endo/Heme/Allergies: Negative for environmental allergies. Does not bruise/bleed easily.       Objective:   Blood pressure (!) 150/92, pulse 98, temperature 98.3 F (36.8 C),  temperature source Temporal, resp. rate 18, SpO2 96 %. There is no height or weight on file to calculate BMI.   Physical Exam:  Physical Exam  Constitutional: She appears well-developed.  Obese female. Very pleasant and talkative.  HENT:  Head: Normocephalic and atraumatic.  Right Ear: Tympanic membrane, external ear and ear canal normal.  Left Ear: Tympanic membrane, external ear and ear canal normal.  Nose: Mucosal edema and rhinorrhea present. No nasal deformity or septal deviation. No epistaxis. Right sinus exhibits no maxillary sinus tenderness and no frontal sinus tenderness. Left sinus exhibits no maxillary sinus tenderness and no frontal sinus tenderness.  Mouth/Throat: Uvula is midline and oropharynx is clear and moist. Mucous membranes are not pale and not dry.  Turbinate hypertrophy noted bilaterally. There is some  clear rhinorrhea noted as well. Cobblestoning present.  Eyes: Pupils are equal, round, and reactive to light. Conjunctivae and EOM are normal. Right eye exhibits no chemosis and no discharge. Left eye exhibits no chemosis and no discharge. Right conjunctiva is not injected. Left conjunctiva is not injected.  Cardiovascular: Normal rate, regular rhythm and normal heart sounds.  Respiratory: Effort normal and breath sounds normal. No accessory muscle usage. No tachypnea. No respiratory distress. She has no wheezes. She has no rhonchi. She has no rales. She exhibits no tenderness.  Lymphadenopathy:    She has no cervical adenopathy.  Neurological: She is alert.  Skin: No abrasion, no petechiae and no rash noted. Rash is not papular, not vesicular and not urticarial. No erythema. No pallor.  Psychiatric: She has a normal mood and affect.     Diagnostic studies:   Spirometry:           Salvatore Marvel, MD  [facility] [place] [facility]

## 2019-04-07 MED ORDER — CEFDINIR 300 MG PO CAPS: 300.0000 mg | ORAL_CAPSULE | Freq: Two times a day (BID) | ORAL | 0 refills | Status: AC

## 2019-04-07 NOTE — Progress Notes (Addendum)
PATIENT: Nicole Mendoza DOB: 1990/12/13  REASON FOR VISIT: follow up HISTORY FROM: patient  Chief Complaint  Patient presents with   Follow-up    Here for intial autopap f/u. Last seen 12/01/2018 by Dr. Rexene Alberts.    cpap    doing ok,  did have covid in June, is negative now.       HISTORY OF PRESENT ILLNESS: Today 04/08/19 Nicole Mendoza is a 28 y.o. female here today for follow up. She is not napping as much. She is waking up on her own now.  Over the past 2 weeks she has felt more sluggish than normal.  She is currently being treated for sinusitis and has a referral to ENT as this is a persistent problem.  Compliance report dated 03/08/2019 through 04/06/2019 shows that she is using CPAP every night.  Every night she has used CPAP greater than 4 hours.  She is 100% compliant.  Average usage is 7 hours and 10 minutes.  AHI was 0.3 on 5 L of water and an EPR of 1.  There was no significant leak noted.   HISTORY: (copied from Dr Guadelupe Sabin note on 12/01/2018)  Ms. Nicole Mendoza is a 28 year old right-handed woman with an underlying medical history of allergic rhinitis, asthma, reflux disease, hypertension, and morbid obesity with a BMI of over 40, who presents for a virtual, video based appointment via [email or web address] for evaluation of her sleep disorder, in particular, concern for underlying obstructive sleep apnea.  The patient is unaccompanied today and joins via laptop from home, I am located in my office.  She is referred by her allergy specialist, Dr. Ernst Bowler and I reviewed his virtual visit office note from 10/29/2018.   She reports snoring and excessive daytime somnolence.  She does not typically wake up rested.  Symptoms have been ongoing for at least 2 years.  She has gained weight in the past 1 or 2 years.  She is familiar with a diagnosis of sleep apnea as she is an Therapist, sports and her father has sleep apnea and uses a CPAP machine.  She also reports that her paternal grandmother passed away from  complications of pulmonary hypertension and untreated sleep apnea.  She would be willing to consider CPAP therapy herself so long as she can tolerate a smaller interface such as a nasal interface.  She would be worried about using a fullface mask.  Her husband has reported that she snores and once or so has noticed a pause in her breathing.  She has woken herself up rarely with a sense of gasping for air or a dream of not being able to breathe.  She typically has a bedtime of around 1030, rise time has to be 515 or 8:30 AM depending on her job.  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  She is working towards streamlining her work schedule.  She does not drink caffeine daily, she is a non-smoker and does not utilize alcohol.  She lives with her husband and they have a small dog in the household.  She has a TV in the bedroom but does not watch it at night.  She does not have night to night nocturia or recurrent morning headaches.  Her Epworth sleepiness score is 13 out of 24, fatigue severity score is 49 out of 63.  In the past, she was diagnosed with vitamin D deficiency and has tried prescription vitamin D but it made her  too dizzy and she resorted to taking over-the-counter vitamin D. She has an upcoming appointment with her primary care physician.   REVIEW OF SYSTEMS: Out of a complete 14 system review of symptoms, the patient complains only of the following symptoms, eye itching, environmental allergies, frequent infections and all other reviewed systems are negative.  Epworth Sleepiness Scale: 6  ALLERGIES: Allergies  Allergen Reactions   Budesonide-Formoterol Fumarate Other (See Comments)    Causes Hypertension   Penicillins Hives   Albuterol Palpitations   Losartan Rash    HOME MEDICATIONS: Outpatient Medications Prior to Visit  Medication Sig Dispense Refill   cefdinir (OMNICEF) 300 MG capsule Take 1 capsule (300 mg total) by mouth 2 (two)  times daily for 10 days. 20 capsule 0   cetirizine (ZYRTEC) 10 MG tablet Take by mouth.     cloNIDine (CATAPRES) 0.1 MG tablet TAKE 1 TABLET BY MOUTH 2 TIMES DAILY AS NEEDED.     EPINEPHrine (AUVI-Q) 0.3 mg/0.3 mL IJ SOAJ injection Use as directed for severe allergic reactions 4 each 1   FASENRA PEN 30 MG/ML SOAJ INJECT 1 PEN UNDER THE SKIN AT WEEKS 0, 4 AND 8 THEN EVERY 8 WEEKS THEREAFTER. 1 pen 8   Fluticasone Propionate (XHANCE) 93 MCG/ACT EXHU Place 2 sprays into both nostrils 2 (two) times daily. 32 mL 5   hydrochlorothiazide (HYDRODIURIL) 25 MG tablet Take 25 mg by mouth.     levalbuterol (XOPENEX HFA) 45 MCG/ACT inhaler Inhale 2 puffs into the lungs every 6 (six) hours as needed for wheezing. 1 Inhaler 1   levalbuterol (XOPENEX) 1.25 MG/3ML nebulizer solution USE 1 VIAL BY NEBULIZATION EVERY 6 (SIX) HOURS AS NEEDED FOR WHEEZING. 90 mL 1   metoprolol succinate (TOPROL-XL) 100 MG 24 hr tablet Take 100 mg by mouth daily. Take with or immediately following a meal.     montelukast (SINGULAIR) 10 MG tablet TAKE 1 TABLET BY MOUTH EVERY DAY 30 tablet 2   norethindrone (ORTHO MICRONOR) 0.35 MG tablet Take 0.35 mg by mouth.     ondansetron (ZOFRAN) 4 MG tablet Take 4 mg by mouth every 8 (eight) hours as needed for nausea or vomiting.     pantoprazole (PROTONIX) 40 MG tablet Take 40 mg by mouth daily.  3   famotidine (PEPCID) 20 MG tablet Take 1 tablet (20 mg total) by mouth 2 (two) times daily. 60 tablet 3   potassium chloride SA (K-DUR) 20 MEQ tablet Take 1 tablet (20 mEq total) by mouth 2 (two) times daily for 5 days. 10 tablet 0   sucralfate (CARAFATE) 1 g tablet Take 2 tablets (2 g total) by mouth 4 (four) times daily -  with meals and at bedtime. 240 tablet 0   Facility-Administered Medications Prior to Visit  Medication Dose Route Frequency Provider Last Rate Last Dose   Benralizumab SOSY 30 mg  30 mg Subcutaneous Q28 days Valentina Shaggy, MD   30 mg at 01/15/19 1350     PAST MEDICAL HISTORY: Past Medical History:  Diagnosis Date   Allergic rhinitis    Angio-edema    Asthma    Fatty liver    GERD (gastroesophageal reflux disease)    Hypertension    Recurrent upper respiratory infection (URI)    Tachycardia     PAST SURGICAL HISTORY: History reviewed. No pertinent surgical history.  FAMILY HISTORY: Family History  Problem Relation Age of Onset   Allergic rhinitis Father    Asthma Father    Allergic rhinitis Sister  Asthma Sister    Angioedema Neg Hx    Atopy Neg Hx    Eczema Neg Hx    Immunodeficiency Neg Hx    Urticaria Neg Hx     SOCIAL HISTORY: Social History   Socioeconomic History   Marital status: Single    Spouse name: Not on file   Number of children: Not on file   Years of education: Not on file   Highest education level: Not on file  Occupational History   Not on file  Social Needs   Financial resource strain: Not on file   Food insecurity    Worry: Not on file    Inability: Not on file   Transportation needs    Medical: Not on file    Non-medical: Not on file  Tobacco Use   Smoking status: Never Smoker   Smokeless tobacco: Never Used  Substance and Sexual Activity   Alcohol use: No    Alcohol/week: 0.0 standard drinks   Drug use: No   Sexual activity: Not on file  Lifestyle   Physical activity    Days per week: Not on file    Minutes per session: Not on file   Stress: Not on file  Relationships   Social connections    Talks on phone: Not on file    Gets together: Not on file    Attends religious service: Not on file    Active member of club or organization: Not on file    Attends meetings of clubs or organizations: Not on file    Relationship status: Not on file   Intimate partner violence    Fear of current or ex partner: Not on file    Emotionally abused: Not on file    Physically abused: Not on file    Forced sexual activity: Not on file  Other Topics  Concern   Not on file  Social History Narrative   Not on file      PHYSICAL EXAM  Vitals:   04/08/19 0756  BP: 131/88  Pulse: 93  Temp: 97.6 F (36.4 C)  Weight: (!) 304 lb 6.4 oz (138.1 kg)  Height: 5\' 8"  (1.727 m)   Body mass index is 46.28 kg/m.  Generalized: Well developed, in no acute distress  Cardiology: normal rate and rhythm, no murmur noted Respiratory: Clear to auscultation bilaterally Mallampati: 3+ Neurological examination  Mentation: Alert oriented to time, place, history taking. Follows all commands speech and language fluent Cranial nerve II-XII: Pupils were equal round reactive to light. Extraocular movements were full, visual field were full on confrontational test. Facial sensation and strength were normal. Uvula tongue midline. Head turning and shoulder shrug  were normal and symmetric. Motor: The motor testing reveals 5 over 5 strength of all 4 extremities. Good symmetric motor tone is noted throughout.  Sensory: Sensory testing is intact to soft touch on all 4 extremities. No evidence of extinction is noted.  Coordination: Cerebellar testing reveals good finger-nose-finger and heel-to-shin bilaterally.  Gait and station: Gait is normal.   DIAGNOSTIC DATA (LABS, IMAGING, TESTING) - I reviewed patient records, labs, notes, testing and imaging myself where available.  No flowsheet data found.   Lab Results  Component Value Date   WBC 20.9 (HH) 01/22/2019   HGB 13.5 01/22/2019   HCT 40.3 01/22/2019   MCV 81 01/22/2019   PLT 380 01/22/2019      Component Value Date/Time   NA 135 12/25/2018 1808   K 2.6 (LL) 12/25/2018  1808   CL 100 12/25/2018 1808   CO2 22 12/25/2018 1808   GLUCOSE 140 (H) 12/25/2018 1808   BUN 11 12/25/2018 1808   CREATININE 0.63 12/25/2018 1808   CALCIUM 9.9 12/25/2018 1808   PROT 8.7 (H) 12/25/2018 1808   ALBUMIN 4.4 12/25/2018 1808   AST 21 12/25/2018 1808   ALT 34 12/25/2018 1808   ALKPHOS 72 12/25/2018 1808    BILITOT 1.2 12/25/2018 1808   GFRNONAA >60 12/25/2018 1808   GFRAA >60 12/25/2018 1808   No results found for: CHOL, HDL, LDLCALC, LDLDIRECT, TRIG, CHOLHDL No results found for: HGBA1C No results found for: VITAMINB12 No results found for: TSH     ASSESSMENT AND PLAN 28 y.o. year old female  has a past medical history of Allergic rhinitis, Angio-edema, Asthma, Fatty liver, GERD (gastroesophageal reflux disease), Hypertension, Recurrent upper respiratory infection (URI), and Tachycardia. here with     ICD-10-CM   1. OSA (obstructive sleep apnea)  G47.33     Teandra has noted benefit after starting CPAP therapy.  We will continue to monitor closely as over the past 2 weeks she feels that she is more sluggish than normal.  She will continue close follow-up with her primary care provider and ENT for concerns of sinusitis.  Compliance report reveals excellent compliance.  She was encouraged to continue using CPAP therapy nightly and for greater than 4 hours each night.  I have advised a 42-month follow-up to ensure she continues to deny benefit.  She verbalizes understanding and agreement with this plan.   No orders of the defined types were placed in this encounter.    No orders of the defined types were placed in this encounter.     I spent 15 minutes with the patient. 50% of this time was spent counseling and educating patient on plan of care and medications.    Debbora Presto, FNP-C 04/08/2019, 4:22 PM [facility] [address], [place], [place] [address] [phone]  I reviewed the above note and documentation by the Nurse Practitioner and agree with the history, exam, assessment and plan as outlined above. I was available for consultation. Star Age, MD, PhD [facility])

## 2019-04-08 ENCOUNTER — Encounter: Payer: Self-pay | Admitting: Allergy & Immunology

## 2019-04-08 ENCOUNTER — Ambulatory Visit (INDEPENDENT_AMBULATORY_CARE_PROVIDER_SITE_OTHER): Payer: Managed Care, Other (non HMO) | Admitting: Family Medicine

## 2019-04-08 ENCOUNTER — Other Ambulatory Visit: Payer: Self-pay

## 2019-04-08 ENCOUNTER — Encounter: Payer: Self-pay | Admitting: Family Medicine

## 2019-04-08 VITALS — BP 131/88 | HR 93 | Temp 97.6°F | Ht 68.0 in | Wt 304.4 lb

## 2019-04-08 DIAGNOSIS — G4733 Obstructive sleep apnea (adult) (pediatric): Secondary | ICD-10-CM | POA: Diagnosis not present

## 2019-04-08 NOTE — Patient Instructions (Signed)
Continue using CPAP nightly and for greater than 4 hours each night   Follow up in 6 months, sooner if needed    Sleep Apnea Sleep apnea affects breathing during sleep. It causes breathing to stop for a short time or to become shallow. It can also increase the risk of:  Heart attack.  Stroke.  Being very overweight (obese).  Diabetes.  Heart failure.  Irregular heartbeat. The goal of treatment is to help you breathe normally again. What are the causes? There are three kinds of sleep apnea:  Obstructive sleep apnea. This is caused by a blocked or collapsed airway.  Central sleep apnea. This happens when the brain does not send the right signals to the muscles that control breathing.  Mixed sleep apnea. This is a combination of obstructive and central sleep apnea. The most common cause of this condition is a collapsed or blocked airway. This can happen if:  Your throat muscles are too relaxed.  Your tongue and tonsils are too large.  You are overweight.  Your airway is too small. What increases the risk?  Being overweight.  Smoking.  Having a small airway.  Being older.  Being female.  Drinking alcohol.  Taking medicines to calm yourself (sedatives or tranquilizers).  Having family members with the condition. What are the signs or symptoms?  Trouble staying asleep.  Being sleepy or tired during the day.  Getting angry a lot.  Loud snoring.  Headaches in the morning.  Not being able to focus your mind (concentrate).  Forgetting things.  Less interest in sex.  Mood swings.  Personality changes.  Feelings of sadness (depression).  Waking up a lot during the night to pee (urinate).  Dry mouth.  Sore throat. How is this diagnosed?  Your medical history.  A physical exam.  A test that is done when you are sleeping (sleep study). The test is most often done in a sleep lab but may also be done at home. How is this treated?   Sleeping  on your side.  Using a medicine to get rid of mucus in your nose (decongestant).  Avoiding the use of alcohol, medicines to help you relax, or certain pain medicines (narcotics).  Losing weight, if needed.  Changing your diet.  Not smoking.  Using a machine to open your airway while you sleep, such as: ? An oral appliance. This is a mouthpiece that shifts your lower jaw forward. ? A CPAP device. This device blows air through a mask when you breathe out (exhale). ? An EPAP device. This has valves that you put in each nostril. ? A BPAP device. This device blows air through a mask when you breathe in (inhale) and breathe out.  Having surgery if other treatments do not work. It is important to get treatment for sleep apnea. Without treatment, it can lead to:  High blood pressure.  Coronary artery disease.  In men, not being able to have an erection (impotence).  Reduced thinking ability. Follow these instructions at home: Lifestyle  Make changes that your doctor recommends.  Eat a healthy diet.  Lose weight if needed.  Avoid alcohol, medicines to help you relax, and some pain medicines.  Do not use any products that contain nicotine or tobacco, such as cigarettes, e-cigarettes, and chewing tobacco. If you need help quitting, ask your doctor. General instructions  Take over-the-counter and prescription medicines only as told by your doctor.  If you were given a machine to use while you sleep,  use it only as told by your doctor.  If you are having surgery, make sure to tell your doctor you have sleep apnea. You may need to bring your device with you.  Keep all follow-up visits as told by your doctor. This is important. Contact a doctor if:  The machine that you were given to use during sleep bothers you or does not seem to be working.  You do not get better.  You get worse. Get help right away if:  Your chest hurts.  You have trouble breathing in enough  air.  You have an uncomfortable feeling in your back, arms, or stomach.  You have trouble talking.  One side of your body feels weak.  A part of your face is hanging down. These symptoms may be an emergency. Do not wait to see if the symptoms will go away. Get medical help right away. Call your local emergency services (911 in the [place] Do not drive yourself to the hospital. Summary  This condition affects breathing during sleep.  The most common cause is a collapsed or blocked airway.  The goal of treatment is to help you breathe normally while you sleep. This information is not intended to replace advice given to you by your health care provider. Make sure you discuss any questions you have with your health care provider. Document Released: 03/12/2008 Document Revised: 03/20/2018 Document Reviewed: 01/27/2018 Elsevier Patient Education  2020 [employer].

## 2019-04-15 ENCOUNTER — Ambulatory Visit: Payer: Self-pay | Admitting: Allergy & Immunology

## 2019-04-16 ENCOUNTER — Other Ambulatory Visit: Payer: Self-pay | Admitting: Allergy & Immunology

## 2019-04-16 DIAGNOSIS — J45901 Unspecified asthma with (acute) exacerbation: Secondary | ICD-10-CM

## 2019-04-16 DIAGNOSIS — J3089 Other allergic rhinitis: Secondary | ICD-10-CM

## 2019-04-22 ENCOUNTER — Telehealth: Payer: Self-pay | Admitting: Allergy & Immunology

## 2019-04-22 DIAGNOSIS — K9049 Malabsorption due to intolerance, not elsewhere classified: Secondary | ICD-10-CM

## 2019-04-22 NOTE — Telephone Encounter (Signed)
Patient contacted me to let me know that she reacted to a raspberry containing product. Evidently this has happened in the past. Blood work ordered and patient notified.  Salvatore Marvel, MD [facility] and [facility] [place]

## 2019-05-05 ENCOUNTER — Telehealth: Payer: Self-pay | Admitting: Allergy & Immunology

## 2019-05-05 NOTE — Telephone Encounter (Signed)
Patient called to check on the status of her outgoing referral to an [facility] doctor. Patient stated that she would like to go to [facility] located at [address], [place] [address].   Patient will be waiting to hear from the ENT to contact her to schedule.  Please advise.

## 2019-05-06 NOTE — Telephone Encounter (Signed)
Patient informed and will call their office to schedule her appointment.

## 2019-05-06 NOTE — Telephone Encounter (Signed)
Referral has been resent to [facility] with [facility].  Thanks

## 2019-05-16 ENCOUNTER — Other Ambulatory Visit: Payer: Self-pay | Admitting: Allergy

## 2019-05-16 NOTE — Progress Notes (Signed)
Called by pt regarding concern for possible reaction.   She states she got her Fasenra injection on Friday 05/14/19.  Later Friday after injection reports her throat felt scratchy.  She started to feel some SOB yesterday that seem to worsen into today.   She also reports nasal congestion and feeling of needing to throat clear.   She states she did not have these symptoms (excpet for the congestion) after her other Fasenra injections.   She did use Xopenex today which helped and took 25mg  benadryl about 30 minutes ago.  She is able to talk in complete sentences and does not sound to be in respiratory distress.  Afebrile.   Advised she go ahead and take her Pepcid 20mg  dose and to use her Xopenex in 4 hours prior to bed.  Also advised if still having some congestion she can take dose of Atarax before bed as well.  Advised if symptoms are not improving she should get COVID testing again (she reports being positive in June) and discussed she could get re-infected.  She voiced understanding of recommendations.  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  At this time do not feel she warranted prediisone or use of her AuviQ.    She is wondering if this could be a delayed reaction to [place].  Discussed its possible but not common.   She did mention that she was on prednisone when she received the 1st 2 Fasenra and this was her first time getting the injection off prednisone.

## 2019-05-17 ENCOUNTER — Telehealth: Payer: Self-pay | Admitting: Allergy & Immunology

## 2019-05-17 ENCOUNTER — Other Ambulatory Visit: Payer: Self-pay | Admitting: Allergy & Immunology

## 2019-05-17 MED ORDER — PREDNISONE 20 MG PO TABS: 20.0000 mg | ORAL_TABLET | Freq: Two times a day (BID) | ORAL | 0 refills | Status: AC

## 2019-05-17 NOTE — Telephone Encounter (Signed)
Talked to patient today. I am sending in some prednisone for her. I am also going to see her tomorrow at 1:15pm instead via a televisit. Please place her as an over book at 1:30 and I can just call her over my lunch hour.   Thanks, Salvatore Marvel, MD Allergy and [facility] [place]

## 2019-05-17 NOTE — Telephone Encounter (Signed)
Patient called back to schedule a TeleVisit on 05/21/2019 at 3:15pm with Dr. Ernst Bowler to discuss side effects from [facility]. Patient had a rapid COVID test which was negative. Patient is going to her PCP today.

## 2019-05-17 NOTE — Telephone Encounter (Signed)
Pt called to speak to dr gallaghers nurse. PT believes that the fasenra is causing her to have fatigue, sinus issues, headaches. Symptoms are similar to covid but she has already had covid. Please call pt back at [phone]

## 2019-05-18 ENCOUNTER — Ambulatory Visit (INDEPENDENT_AMBULATORY_CARE_PROVIDER_SITE_OTHER): Payer: Managed Care, Other (non HMO) | Admitting: Allergy & Immunology

## 2019-05-18 ENCOUNTER — Encounter: Payer: Self-pay | Admitting: Allergy & Immunology

## 2019-05-18 ENCOUNTER — Other Ambulatory Visit: Payer: Self-pay

## 2019-05-18 DIAGNOSIS — J4540 Moderate persistent asthma, uncomplicated: Secondary | ICD-10-CM

## 2019-05-18 DIAGNOSIS — T50905D Adverse effect of unspecified drugs, medicaments and biological substances, subsequent encounter: Secondary | ICD-10-CM

## 2019-05-18 DIAGNOSIS — T7800XD Anaphylactic reaction due to unspecified food, subsequent encounter: Secondary | ICD-10-CM

## 2019-05-18 DIAGNOSIS — B999 Unspecified infectious disease: Secondary | ICD-10-CM

## 2019-05-18 DIAGNOSIS — J3089 Other allergic rhinitis: Secondary | ICD-10-CM

## 2019-05-18 DIAGNOSIS — J302 Other seasonal allergic rhinitis: Secondary | ICD-10-CM

## 2019-05-18 NOTE — Progress Notes (Signed)
I talked to her extensively over the weekend and we did a televisit today. She is doing well on the prednisone and improving.  Salvatore Marvel, MD Allergy and [facility] [place]

## 2019-05-18 NOTE — Telephone Encounter (Signed)
Pt has been added to your schedule for over book at 130

## 2019-05-18 NOTE — Progress Notes (Signed)
RE: Nicole Mendoza MRN: [id number] DOB: Oct 14, 1990 Date of Telemedicine Visit: 05/18/2019  Referring provider: Rubie Maid, MD Primary care provider: Rubie Maid, MD  Chief Complaint: Asthma   Telemedicine Follow Up Visit via Telephone: I connected with Nicole Mendoza for a follow up on 05/18/19 by telephone and verified that I am speaking with the correct person using two identifiers.   I discussed the limitations, risks, security and privacy concerns of performing an evaluation and management service by telephone and the availability of in person appointments. I also discussed with the patient that there may be a patient responsible charge related to this service. The patient expressed understanding and agreed to proceed.  Patient is at home.  Provider is at the office.  Visit start time: 11:55 AM Visit end time: 12:17 PM Insurance consent/check in by: [name] consent and medical assistant/nurse: [name]  History of Present Illness:  She is a 28 y.o. female, who is being followed for a multitude of atopic complaints. Her previous allergy office visit was in October 2020 with myself. At that time, her lung function looked normal.  We continued her on Asmanex 220 mcg 1 puff twice daily in combination with Singulair and Fasenra.  The addition of the [name] seem to have been helping her asthma symptoms.  For her allergic rhinitis, we continued cetirizine as well as Singulair and XHANCE 2 sprays per nostril twice daily.  We did refer her to ENT.  We treated her with cefdinir twice daily for 10 days, which was her first antibiotic in over a year.  We recommended continued avoidance of peanuts and tree nuts.  In the interim, she reports that she has had started having reactions to her Fasenra injections.  On Friday afternoon, she gave herself her Fasenra injection.  Late Friday evening around 8 hours after her injection, she noticed that she started having rhinorrhea and  palpitations.  She had forgotten to take her beta-blocker, so she went ahead and did that.  On Saturday, she developed fatigue and a headache with the worsening congestion and shortness of breath.  On Sunday she developed increased mucus production in combination with worsening shortness of breath.  She ended up using her rescue inhaler with improvement.  She reports constant throat clearing over the course of the weekend.  Benadryl seemed to help.  She did contact our on-call physician, [name], who recommended additional antihistamines and told her that pharyngitis was a side effect of Fasenra.  She contacted me via email shortly thereafter and we decided to try a low-dose course of steroids.  She started the prednisone this morning.  She did go to get tested for Covid and had a rapid swab that was negative on November 29.  Her foster son also had a rapid Covid test that was negative as well.  She is wondering if this is secondary to her [name]. She does tell me that in retrospect, she had a similar reaction in October at her last injection. This is when I saw her last time and treated her with cefdinir for presumed sinusitis. She is open to trying another biologic, but she would like to go without one for a few months to see how she does. She does tell me that she has been more compliant with her Asmanex lately.   She has remained on her acid reflux medications. This has been controlling her symptoms pretty well. She did have an ENT consult that was completely negative. No further surgical interventions planned.  They are building a house and living with her mother. She took her regular medicines the next morning. She was getting more winded on Sunday. She is on permanent potassium replacement.  Otherwise, there have been no changes to her past medical history, surgical history, family history, or social history.  Assessment and Plan:  Freja is a 28 y.o. female with:  Mild persistent  asthma - well controlled on the Asmanex (holding [name])  Possible adverse reaction to Fasenra  Recurrent infections- improved following Pneumovax (protection to 3/23 serotypes initially to 16/23 serotypes post-vaccination of Pneumococcus),but now with worsening frequency of infections  Seasonal and perennial allergic rhinitis(trees, weeds, grasses and dust mites)  Gastroesophageal reflux disease- onpantoprazole and famotidine  Adverse food reactions - improved with minimizing exposure to food additives and avoiding peanuts/tree nuts  Snoring with perceived poor sleep quality- improved with use of the CPAP  Possible migraines ?   Nicole Mendoza presents for a sick televisit.  She feels that she has developed adverse reactions to her [name].  This has happened now twice.  She is interested in coming off of her [name] to see if she gets any better.  While she is open to adding a different biologic, she would like to see if the more regular use of the Asmanex is enough to get the better control that she is searching for.  I did recommend that she continue on her prednisone course and contact us with any changes to her clinical condition.    Diagnostics: None.  Medication List:  Current Outpatient Medications  Medication Sig Dispense Refill  . cetirizine (ZYRTEC) 10 MG tablet Take by mouth.    . cloNIDine (CATAPRES) 0.1 MG tablet TAKE 1 TABLET BY MOUTH 2 TIMES DAILY AS NEEDED.    Marland Kitchen EPINEPHrine (AUVI-Q) 0.3 mg/0.3 mL IJ SOAJ injection Use as directed for severe allergic reactions 4 each 1  . famotidine (PEPCID) 20 MG tablet Take 1 tablet (20 mg total) by mouth 2 (two) times daily. 60 tablet 3  . FASENRA PEN 30 MG/ML SOAJ INJECT 1 PEN UNDER THE SKIN AT WEEK 8, THEN 1 PEN EVERY 8 WEEKS THEREAFTER. 1 pen 8  . Fluticasone Propionate (XHANCE) 93 MCG/ACT EXHU Place 2 sprays into both nostrils 2 (two) times daily. 32 mL 5  . hydrochlorothiazide (HYDRODIURIL) 25 MG tablet Take 25 mg by  mouth.    . levalbuterol (XOPENEX HFA) 45 MCG/ACT inhaler Inhale 2 puffs into the lungs every 6 (six) hours as needed for wheezing. 1 Inhaler 1  . levalbuterol (XOPENEX) 1.25 MG/3ML nebulizer solution USE 1 VIAL BY NEBULIZATION EVERY 6 (SIX) HOURS AS NEEDED FOR WHEEZING. 90 mL 1  . metoprolol succinate (TOPROL-XL) 100 MG 24 hr tablet Take 100 mg by mouth daily. Take with or immediately following a meal.    . montelukast (SINGULAIR) 10 MG tablet TAKE 1 TABLET BY MOUTH EVERY DAY 30 tablet 5  . norethindrone (ORTHO MICRONOR) 0.35 MG tablet Take 0.35 mg by mouth.    . ondansetron (ZOFRAN) 4 MG tablet Take 4 mg by mouth every 8 (eight) hours as needed for nausea or vomiting.    . pantoprazole (PROTONIX) 40 MG tablet Take 40 mg by mouth daily.  3  . potassium chloride SA (K-DUR) 20 MEQ tablet Take 1 tablet (20 mEq total) by mouth 2 (two) times daily for 5 days. 10 tablet 0  . predniSONE (DELTASONE) 20 MG tablet Take 1 tablet (20 mg total) by mouth 2 (two) times daily with a  meal for 5 days. 10 tablet 0   No current facility-administered medications for this visit.    Allergies: Allergies  Allergen Reactions  . Budesonide-Formoterol Fumarate Other (See Comments)    Causes Hypertension  . Penicillins Hives  . Albuterol Palpitations  . Losartan Rash   I reviewed her past medical history, social history, family history, and environmental history and no significant changes have been reported from previous visits.  Review of Systems  Constitutional: Positive for chills and diaphoresis. Negative for activity change and appetite change.  HENT: Negative for congestion, dental problem, postnasal drip, rhinorrhea, sinus pressure, sinus pain and sore throat.   Eyes: Negative for pain, discharge, redness and itching.  Respiratory: Positive for chest tightness and shortness of breath. Negative for apnea, wheezing and stridor.   Gastrointestinal: Negative for diarrhea, nausea and vomiting.  Endocrine:  Negative for cold intolerance and heat intolerance.  Musculoskeletal: Negative for arthralgias, joint swelling and myalgias.  Skin: Negative for rash.  Allergic/Immunologic: Positive for environmental allergies and food allergies. Negative for immunocompromised state.    Objective:  Physical exam not obtained as encounter was done via telephone.   Previous notes and tests were reviewed.  I discussed the assessment and treatment plan with the patient. The patient was provided an opportunity to ask questions and all were answered. The patient agreed with the plan and demonstrated an understanding of the instructions.   The patient was advised to call back or seek an in-person evaluation if the symptoms worsen or if the condition fails to improve as anticipated.  I provided 22 minutes of non-face-to-face time during this encounter.  It was my pleasure to participate in [facility] care today. Please feel free to contact me with any questions or concerns.   Sincerely,  Valentina Shaggy, MD

## 2019-05-19 ENCOUNTER — Ambulatory Visit: Payer: Self-pay | Admitting: Allergy & Immunology

## 2019-05-19 MED ORDER — CEFDINIR 300 MG PO CAPS: 300.0000 mg | ORAL_CAPSULE | Freq: Two times a day (BID) | ORAL | 0 refills | Status: AC

## 2019-05-19 NOTE — Addendum Note (Signed)
Addended by: Valentina Shaggy on: 05/19/2019 10:26 AM   Modules accepted: Orders

## 2019-05-19 NOTE — Progress Notes (Signed)
Patient reached out to let me know that she is continuing to have worsening sinus discharge and pressure despite the prednisone. Therefore I sent in cefdinir for seven days.  Salvatore Marvel, MD Allergy and [facility] [place]

## 2019-05-21 ENCOUNTER — Ambulatory Visit: Payer: Managed Care, Other (non HMO) | Admitting: Allergy & Immunology

## 2019-05-21 MED ORDER — SUCRALFATE 1 G PO TABS: 1.0000 g | ORAL_TABLET | Freq: Three times a day (TID) | ORAL | 2 refills | Status: DC

## 2019-05-21 NOTE — Addendum Note (Signed)
Addended by: Valentina Shaggy on: 05/21/2019 12:05 PM   Modules accepted: Orders

## 2019-05-24 MED ORDER — FLUCONAZOLE 100 MG PO TABS: 200.0000 mg | ORAL_TABLET | Freq: Every day | ORAL | 0 refills | Status: AC

## 2019-05-24 NOTE — Addendum Note (Signed)
Addended by: Valentina Shaggy on: 05/24/2019 07:22 AM   Modules accepted: Orders

## 2019-05-24 NOTE — Progress Notes (Signed)
Patient reports thrush following antibiotics. Script sent in for fluconazole for one week.  Salvatore Marvel, MD Allergy and [facility]

## 2019-05-27 ENCOUNTER — Encounter: Payer: Self-pay | Admitting: Allergy & Immunology

## 2019-05-27 ENCOUNTER — Other Ambulatory Visit: Payer: Self-pay

## 2019-05-27 ENCOUNTER — Ambulatory Visit (INDEPENDENT_AMBULATORY_CARE_PROVIDER_SITE_OTHER): Payer: Managed Care, Other (non HMO) | Admitting: Allergy & Immunology

## 2019-05-27 VITALS — BP 172/78 | HR 110 | Temp 98.2°F | Resp 20 | Ht 68.0 in | Wt 299.2 lb

## 2019-05-27 DIAGNOSIS — J3089 Other allergic rhinitis: Secondary | ICD-10-CM | POA: Diagnosis not present

## 2019-05-27 DIAGNOSIS — T7800XD Anaphylactic reaction due to unspecified food, subsequent encounter: Secondary | ICD-10-CM | POA: Diagnosis not present

## 2019-05-27 DIAGNOSIS — J302 Other seasonal allergic rhinitis: Secondary | ICD-10-CM | POA: Diagnosis not present

## 2019-05-27 DIAGNOSIS — J4540 Moderate persistent asthma, uncomplicated: Secondary | ICD-10-CM | POA: Diagnosis not present

## 2019-05-27 NOTE — Patient Instructions (Addendum)
1. Seasonal and perennial allergic rhinitis (trees, weeds, grasses and dust mites) - Continue with: Zyrtec (cetirizine) 10mg  tablet once daily, Singulair (montelukast) 10mg  daily + Xhance two sprays per nostril twice daily.  2. Adverse food reaction (tree nuts, peanuts)  - Continue to avoid peanuts and tree nuts. - Continue to avoid preservatives as you are able.  Nicole Mendoza is up to date.   3. Mild persistent asthma - with COVID19 positive swab 12/13/18 - Lung function looks excellent today. - I do not think that we need more steroids.  - Daily controller medication(s): Asmanex 264mcg two puffs twice daily + Singulair 10mg   - Prior to physical activity: ProAir 2 puffs 10-15 minutes before physical activity. - Rescue medications: ProAir 4 puffs every 4-6 hours as needed - Changes during respiratory infections or worsening symptoms: Increase Asmanex 230mcg to 4 puffs twice daily for ONE TO TWO WEEKS. - Asthma control goals:  * Full participation in all desired activities (may need albuterol before activity) * Albuterol use two time or less a week on average (not counting use with activity) * Cough interfering with sleep two time or less a month * Oral steroids no more than once a year * No hospitalizations  4. Reflux  - I think you need to get scoped again.   - We will put in a referral.   5. Return in about 4 months (around 09/25/2019). This can be an in-person, a virtual Webex or a telephone follow up visit.   Please inform us of any Emergency Department visits, hospitalizations, or changes in symptoms. Call us before going to the ED for breathing or allergy symptoms since we might be able to fit you in for a sick visit. Feel free to contact us anytime with any questions, problems, or concerns.  It was a pleasure to see you again today!  Websites that have reliable patient information: 1. American Academy of Asthma, Allergy, and Immunology: www.aaaai.org 2. Food Allergy Research and  Education (FARE): foodallergy.org 3. Mothers of Asthmatics: http://www.asthmacommunitynetwork.org 4. American College of Allergy, Asthma, and Immunology: www.acaai.org  "Like" [place] on Facebook and Instagram for our latest updates!      Make sure you are registered to vote! If you have moved or changed any of your contact information, you will need to get this updated before voting!  In some cases, you MAY be able to register to vote online: [email or web address]

## 2019-05-27 NOTE — Progress Notes (Signed)
FOLLOW UP  Date of Service/Encounter:  05/27/19   Assessment:   Mild persistent asthma - well controlled on the Asmanex (holding Fasenra)  Possible adverse reaction to Fasenra  Recurrent infections- improved following Pneumovax (protection to 3/23 serotypes initially to 16/23 serotypes post-vaccination of Pneumococcus),but now with worsening frequency of infections  Seasonal and perennial allergic rhinitis(trees, weeds, grasses and dust mites)  Gastroesophageal reflux disease- onpantoprazole and famotidine and PRN carafate   Adverse food reactions - resolved with minimizing exposure to food additives and avoiding peanuts/tree nuts  Snoring with perceived poor sleep quality-improved with use of the CPAP  Possible migraines ?    Nicole Mendoza presents for sick visit.  She is worried that she is continuing to have some asthma symptoms.  However, her spirometry today is stellar.  I do not think that the shortness of breath she is experiencing is related to asthma at all.  This does make her feel somewhat relieved.  She is reporting some "gastric spasms".  She also reports some severe heartburn symptoms associated with meals.  She is on an H2 blocker, a PPI, and Carafate with continued symptoms.  Therefore I recommended that she contact her gastroenterologist to try to get in for an evaluation. We are going to continue with the Asmanex BID as she is doing well. I think we hold off on biologics for now since she is stabilized from a respiratory perspective.    Plan/Recommendations:   1. Seasonal and perennial allergic rhinitis (trees, weeds, grasses and dust mites) - Continue with: Zyrtec (cetirizine) 10mg  tablet once daily, Singulair (montelukast) 10mg  daily + Xhance two sprays per nostril twice daily.  2. Adverse food reaction (tree nuts, peanuts)  - Continue to avoid peanuts and tree nuts. - Continue to avoid preservatives as you are able.  Nicole Mendoza is up to date.   3.  Mild persistent asthma - with COVID19 positive swab 12/13/18 - Lung function looks excellent today. - I do not think that we need more steroids.  - Daily controller medication(s): Asmanex 25mcg two puffs twice daily + Singulair 10mg   - Prior to physical activity: ProAir 2 puffs 10-15 minutes before physical activity. - Rescue medications: ProAir 4 puffs every 4-6 hours as needed - Changes during respiratory infections or worsening symptoms: Increase Asmanex 251mcg to 4 puffs twice daily for ONE TO TWO WEEKS. - Asthma control goals:  * Full participation in all desired activities (may need albuterol before activity) * Albuterol use two time or less a week on average (not counting use with activity) * Cough interfering with sleep two time or less a month * Oral steroids no more than once a year * No hospitalizations  4. Reflux  - I think you need to get scoped again.   - We will put in a referral.   5. Return in about 4 months (around 09/25/2019). This can be an in-person, a virtual Webex or a telephone follow up visit.   Subjective:   Nicole Mendoza is a 28 y.o. female presenting today for follow up of  Chief Complaint  Patient presents with  . Asthma    Not feeling well since last injection. Still having SOB.    Nicole Mendoza has a history of the following: Patient Active Problem List   Diagnosis Date Noted  . 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 Seasonal and perennial allergic rhinitis 01/22/2019  . Mild persistent asthma without complication 0000000  . Essential hypertension 04/22/2017  . Gastroesophageal  reflux disease without esophagitis 12/22/2015  . Current use of beta blocker 12/22/2015  . Asthma with acute exacerbation 05/09/2015  . Acute maxillary sinusitis 05/09/2015  . Hypertension 05/09/2015  . Allergic rhinitis 05/09/2015    History obtained from: chart review and patient.  Nicole Mendoza is a 28 y.o. female presenting for a sick visit.  I last  saw her via a televisit on December 1.  At that time, she was having a continued reaction to her [name].  This seemed to be causing a lot of mucus production, sinus pain, and and purulent discharge.  We started her on initially on a prednisone wean and then she reported continued sinus pressure and pain so we added on cefdinir in case she had developed a secondary bacterial infection.  This seemed to do the trick for her.  Since the last visit, she has continued to have some issues. She reports that is having a lot of shortness of breath episodes. She completed her cefdinir and her steroids. She woke up fine on Monday. Then she took care of the boys and was doing housework without any problems at all.  However, during the day on Monday, she reports that she was having shortness of breath.  However she does note that it was associated with severe reflux.  She reports that she has "waves when [she] cannot breathe" and "gastric spasms".  She denies any acute abdomen, however.  She does see a gastroenterologist but has not seen the office for approximately 4 years.  She had an endoscopy within the last 10 years that was normal.  She denies any vomiting of blood and denies any blood in her stools.  She remains on Asmanex 2 puffs twice daily.  She feels that this is controlling her asthma symptoms well.  She was just concerned about her shortness of breath and wanted to make sure that asthma was not contributing to this.  She does feel better knowing that her spirometry is normal, however.  She has not been using her rescue inhaler.  Otherwise, there have been no changes to her past medical history, surgical history, family history, or social history.    Review of Systems  Constitutional: Negative.  Negative for chills, fever, malaise/fatigue and weight loss.  HENT: Negative.  Negative for congestion, ear discharge and ear pain.   Eyes: Negative for pain, discharge and redness.  Respiratory: Positive for  shortness of breath. Negative for cough, sputum production and wheezing.   Cardiovascular: Negative.  Negative for chest pain and palpitations.  Gastrointestinal: Positive for abdominal pain. Negative for constipation, diarrhea, heartburn, nausea and vomiting.  Skin: Negative.  Negative for itching and rash.  Neurological: Negative for dizziness and headaches.  Endo/Heme/Allergies: Negative for environmental allergies. Does not bruise/bleed easily.       Objective:   Blood pressure (!) 172/78, pulse (!) 110, temperature 98.2 F (36.8 C), temperature source Temporal, resp. rate 20, height 5\' 8"  (1.727 m), weight 299 lb 3.2 oz (135.7 kg), SpO2 100 %. Body mass index is 45.49 kg/m.   Physical Exam:  Physical Exam  Constitutional: She appears well-developed.  Pleasant.  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  HENT:  Head: Normocephalic and atraumatic.  Right Ear: Tympanic membrane, external ear and ear canal normal.  Left Ear: Tympanic membrane, external ear and ear canal normal.  Nose: Mucosal edema and rhinorrhea present. No nasal deformity or septal deviation. No epistaxis. Right sinus exhibits no maxillary sinus tenderness and no frontal sinus tenderness. Left  sinus exhibits no maxillary sinus tenderness and no frontal sinus tenderness.  Mouth/Throat: Uvula is midline and oropharynx is clear and moist. Mucous membranes are not pale and not dry.  Cobblestoning in the posterior oropharynx.  Eyes: Pupils are equal, round, and reactive to light. Conjunctivae and EOM are normal. Right eye exhibits no chemosis and no discharge. Left eye exhibits no chemosis and no discharge. Right conjunctiva is not injected. Left conjunctiva is not injected.  Cardiovascular: Normal rate, regular rhythm and normal heart sounds.  Respiratory: Effort normal and breath sounds normal. No accessory muscle usage. No tachypnea. No respiratory distress. She has no wheezes. She has no rhonchi. She has no  rales. She exhibits no tenderness.  Moving air well in all lung fields.  No increased work of breathing.  Lymphadenopathy:    She has no cervical adenopathy.  Neurological: She is alert.  Skin: No abrasion, no petechiae and no rash noted. Rash is not papular, not vesicular and not urticarial. No erythema. No pallor.  Psychiatric: She has a normal mood and affect.     Diagnostic studies:    Spirometry: results normal (FEV1: 3.14/85%, FVC: 3.91/89%, FEV1/FVC: 80%).    Spirometry consistent with normal pattern.   Allergy Studies: none       Salvatore Marvel, MD  [facility]

## 2019-06-05 ENCOUNTER — Other Ambulatory Visit: Payer: Self-pay | Admitting: Allergy & Immunology

## 2019-07-29 DIAGNOSIS — R198 Other specified symptoms and signs involving the digestive system and abdomen: Secondary | ICD-10-CM | POA: Insufficient documentation

## 2019-08-10 ENCOUNTER — Encounter: Payer: Self-pay | Admitting: Allergy & Immunology

## 2019-08-10 ENCOUNTER — Other Ambulatory Visit: Payer: Self-pay

## 2019-08-10 ENCOUNTER — Ambulatory Visit (INDEPENDENT_AMBULATORY_CARE_PROVIDER_SITE_OTHER): Payer: 59 | Admitting: Allergy & Immunology

## 2019-08-10 VITALS — BP 128/82 | HR 100 | Temp 98.0°F | Resp 18 | Ht 68.0 in | Wt 281.6 lb

## 2019-08-10 DIAGNOSIS — T7800XD Anaphylactic reaction due to unspecified food, subsequent encounter: Secondary | ICD-10-CM

## 2019-08-10 DIAGNOSIS — J4540 Moderate persistent asthma, uncomplicated: Secondary | ICD-10-CM | POA: Diagnosis not present

## 2019-08-10 DIAGNOSIS — J3089 Other allergic rhinitis: Secondary | ICD-10-CM

## 2019-08-10 DIAGNOSIS — J302 Other seasonal allergic rhinitis: Secondary | ICD-10-CM

## 2019-08-10 DIAGNOSIS — B370 Candidal stomatitis: Secondary | ICD-10-CM

## 2019-08-10 DIAGNOSIS — B999 Unspecified infectious disease: Secondary | ICD-10-CM | POA: Diagnosis not present

## 2019-08-10 DIAGNOSIS — K219 Gastro-esophageal reflux disease without esophagitis: Secondary | ICD-10-CM

## 2019-08-10 MED ORDER — FLUCONAZOLE 200 MG PO TABS: 200.0000 mg | ORAL_TABLET | Freq: Two times a day (BID) | ORAL | 0 refills | Status: AC

## 2019-08-10 MED ORDER — MONTELUKAST SODIUM 10 MG PO TABS: 10.0000 mg | ORAL_TABLET | Freq: Every day | ORAL | 5 refills | Status: DC

## 2019-08-10 MED ORDER — ASMANEX (30 METERED DOSES) 220 MCG/INH IN AEPB: 2.0000 | INHALATION_SPRAY | Freq: Two times a day (BID) | RESPIRATORY_TRACT | 5 refills | Status: DC

## 2019-08-10 NOTE — Patient Instructions (Addendum)
1. Seasonal and perennial allergic rhinitis (trees, weeds, grasses and dust mites) - Continue with: Zyrtec (cetirizine) 10mg  tablet once daily, Singulair (montelukast) 10mg  daily + Xhance two sprays per nostril twice daily.    2. Adverse food reaction (tree nuts, peanuts)  - Continue to avoid peanuts and tree nuts.  Nicole Mendoza is up to date.    3. Mild persistent asthma, uncomplicated - Lung function looks excellent today. - We are hoping to be able to give Moderna vaccine in our clinic. - We will call you when we are allowed to start giving Moderna vaccines.  - Daily controller medication(s): Asmanex 274mcg two puffs twice daily + Singulair 10mg   - Prior to physical activity: ProAir 2 puffs 10-15 minutes before physical activity. - Rescue medications: ProAir 4 puffs every 4-6 hours as needed - Changes during respiratory infections or worsening symptoms: Increase Asmanex 28mcg to 4 puffs twice daily for ONE TO TWO WEEKS. - Asthma control goals:  * Full participation in all desired activities (may need albuterol before activity) * Albuterol use two time or less a week on average (not counting use with activity) * Cough interfering with sleep two time or less a month * Oral steroids no more than once a year * No hospitalizations  4. Return in about 6 months (around 02/07/2020). This can be an in-person, a virtual Webex or a telephone follow up visit.   Please inform us of any Emergency Department visits, hospitalizations, or changes in symptoms. Call us before going to the ED for breathing or allergy symptoms since we might be able to fit you in for a sick visit. Feel free to contact us anytime with any questions, problems, or concerns.  It was a pleasure to see you again today!  Websites that have reliable patient information: 1. American Academy of Asthma, Allergy, and Immunology: www.aaaai.org 2. Food Allergy Research and Education (FARE): foodallergy.org 3. Mothers of Asthmatics:  http://www.asthmacommunitynetwork.org 4. American College of Allergy, Asthma, and Immunology: www.acaai.org  "Like" [place] on Facebook and Instagram for our latest updates!      Make sure you are registered to vote! If you have moved or changed any of your contact information, you will need to get this updated before voting!  In some cases, you MAY be able to register to vote online: [email or web address]

## 2019-08-10 NOTE — Progress Notes (Signed)
FOLLOW UP  Date of Service/Encounter:  08/10/19   Assessment:   Mild persistent asthma - well controlled on the Asmanex (holding Fasenra)  Recurrent infections- improved following Pneumovax (protection to 3/23 serotypes initially to 16/23 serotypes post-vaccination of Pneumococcus),but now with worsening frequency of infections  Seasonal and perennial allergic rhinitis(trees, weeds, grasses and dust mites)  Gastroesophageal reflux disease- onpantoprazoleand famotidine and PRN carafate   Adverse food reactions - resolved with minimizing exposure to food additives and avoiding peanuts/tree nuts  Snoring with perceived poor sleep quality-improved with use of the CPAP  Possible migraines ?  Oral candidiasis    Plan/Recommendations:   1. Seasonal and perennial allergic rhinitis (trees, weeds, grasses and dust mites) - Continue with: Zyrtec (cetirizine) 10mg  tablet once daily, Singulair (montelukast) 10mg  daily + Xhance two sprays per nostril twice daily.    2. Adverse food reaction (tree nuts, peanuts)  - Continue to avoid peanuts and tree nuts.  Wynona Luna is up to date.    3. Mild persistent asthma, uncomplicated - Lung function looks excellent today. - We are hoping to be able to give Moderna vaccine in our clinic. - We will call you when we are allowed to start giving Moderna vaccines.  - Daily controller medication(s): Asmanex 244mcg two puffs twice daily + Singulair 10mg   - Prior to physical activity: ProAir 2 puffs 10-15 minutes before physical activity. - Rescue medications: ProAir 4 puffs every 4-6 hours as needed - Changes during respiratory infections or worsening symptoms: Increase Asmanex 270mcg to 4 puffs twice daily for ONE TO TWO WEEKS. - Asthma control goals:  * Full participation in all desired activities (may need albuterol before activity) * Albuterol use two time or less a week on average (not counting use with activity) * Cough  interfering with sleep two time or less a month * Oral steroids no more than once a year * No hospitalizations  4. Return in about 6 months (around 02/07/2020). This can be an in-person, a virtual Webex or a telephone follow up visit.   Subjective:   Shyniece Corrado is a 29 y.o. female presenting today for follow up of  Chief Complaint  Patient presents with  . Asthma    Mirelle Bielefeldt has a history of the following: Patient Active Problem List   Diagnosis Date Noted  . 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 Seasonal and perennial allergic rhinitis 01/22/2019  . Mild persistent asthma without complication 0000000  . Essential hypertension 04/22/2017  . Gastroesophageal reflux disease without esophagitis 12/22/2015  . Current use of beta blocker 12/22/2015  . Asthma with acute exacerbation 05/09/2015  . Acute maxillary sinusitis 05/09/2015  . Hypertension 05/09/2015  . Allergic rhinitis 05/09/2015    History obtained from: chart review and patient.  Latechia is a 29 y.o. female presenting for a follow up visit.  Nyaja is well-known to our practice and was last seen in December 2020 for sick visit.  At that time, she was feeling rundown and short of breath, but her spirometry was stellar.  She has a history of severe heartburn symptoms and we recommended that she see her gastroenterologist again.  She was already on a H2 blocker, PPI, and Carafate.  We continued with her Asmanex twice daily for her asthma and continue to hold the [place].  We also continued Singulair.  We recommended continued avoidance of peanuts and tree nuts.  Her allergic rhinitis was controlled with Zyrtec, Singulair, and XHANCE.  Since the  last visit, she has mostly done well.  She did undergo another endoscopy and everything was fairly normal.  In between all of this, she also had an elevated glucose level of 165.  Her PCP initially wanted to start her on Metformin and glipizide, but she pushed  back and has changed her diet instead.  She is doing weight watchers without cheating and has lost about 20 pounds.  Her husband is also lost about 15 pounds on the same plan.  She has not started exercising, but she is planning to push that in soon.  She has been testing her blood sugar notes come down.  Her highest postprandial has been 145.  Since making these changes, she reports increased energy.  She did undergo an EGD in January 2021.  There is some issues with placement of her IV, but other than that she did well.  It was visually normal.  Her biopsies were negative for celiac.  She did have quite a bit of gastritis noted.  She was continued on her PPI and Pepcid.  She tells me there is a hernia appreciated, but they are not planning to surgically repair it.  Asthma/Respiratory Symptom History: She remains on Asmanex twice daily.  She never started the [place] again, but she.  She feels pretty good without it anyway.  She denies any nighttime symptoms. Kimball's asthma has been well controlled. She has not required rescue medication, experienced nocturnal awakenings due to lower respiratory symptoms, nor have activities of daily living been limited. She has required no Emergency Department or Urgent Care visits for her asthma. She has required zero courses of systemic steroids for asthma exacerbations since the last visit. ACT score today is 23, indicating excellent asthma symptom control.   Allergic Rhinitis Symptom History: She remains on the cetirizine as well as the Singulair.  She has not needed any antibiotics in quite some time.  She was tested for Covid again, but was negative.  She remains somewhat skeptical on the vaccine, but mostly due to concerns for having an allergic reaction given her history.  She is going to wait until we can provide it in the clinic and we can watch her.  Food Allergy Symptom History: She continues to avoid peanuts and tree nuts.  She has had no accidental exposures.   Her epinephrine autoinjector is up-to-date.  Recurrent Infections Symptom History: She denies any infection since last visit.  She does not remember the last time she needed antibiotics.  While knocking on wood, she says she has never felt better.  Otherwise, there have been no changes to her past medical history, surgical history, family history, or social history.    Review of Systems  Constitutional: Negative.  Negative for chills, fever, malaise/fatigue and weight loss.  HENT: Negative.  Negative for congestion, ear discharge, ear pain and sore throat.   Eyes: Negative for pain, discharge and redness.  Respiratory: Negative for cough, sputum production, shortness of breath and wheezing.   Cardiovascular: Negative.  Negative for chest pain and palpitations.  Gastrointestinal: Negative for abdominal pain, constipation, diarrhea, heartburn, nausea and vomiting.  Skin: Negative.  Negative for itching and rash.  Neurological: Negative for dizziness and headaches.  Endo/Heme/Allergies: Negative for environmental allergies. Does not bruise/bleed easily.       Objective:   Blood pressure 128/82, pulse 100, temperature 98 F (36.7 C), resp. rate 18, height 5\' 8"  (1.727 m), weight 281 lb 9.6 oz (127.7 kg), SpO2 99 %. Body mass index is  42.82 kg/m.   Physical Exam:  Physical Exam  Constitutional: She appears well-developed.  Very talkative as always. Obese but she has clearly lost weight.   HENT:  Head: Normocephalic and atraumatic.  Right Ear: Tympanic membrane, external ear and ear canal normal.  Left Ear: Tympanic membrane, external ear and ear canal normal.  Nose: Mucosal edema and rhinorrhea present. No nasal deformity or septal deviation. No epistaxis. Right sinus exhibits no maxillary sinus tenderness and no frontal sinus tenderness. Left sinus exhibits no maxillary sinus tenderness and no frontal sinus tenderness.  Mouth/Throat: Uvula is midline and oropharynx is clear and  moist. Mucous membranes are not pale and not dry.  Turbinate hypertrophy noted. Some white lesions noted in her oral cavity (she does endorse some oral pain).   Eyes: Pupils are equal, round, and reactive to light. Conjunctivae and EOM are normal. Right eye exhibits no chemosis and no discharge. Left eye exhibits no chemosis and no discharge. Right conjunctiva is not injected. Left conjunctiva is not injected.  Cardiovascular: Normal rate, regular rhythm and normal heart sounds.  Respiratory: Effort normal and breath sounds normal. No accessory muscle usage. No tachypnea. No respiratory distress. She has no wheezes. She has no rhonchi. She has no rales. She exhibits no tenderness.  Moving air well in all lung fields. No increased work of breathing noted.   Lymphadenopathy:    She has no cervical adenopathy.  Neurological: She is alert.  Skin: No abrasion, no petechiae and no rash noted. Rash is not papular, not vesicular and not urticarial. No erythema. No pallor.  No eczematous or urticarial lesions noted.   Psychiatric: She has a normal mood and affect.     Diagnostic studies:    Spirometry: results normal (FEV1: 3.19/88%, FVC: 3.98/93%, FEV1/FVC: 80%).    Spirometry consistent with normal pattern.   Allergy Studies: none       Salvatore Marvel, MD  [facility]

## 2019-08-11 ENCOUNTER — Encounter: Payer: Self-pay | Admitting: Allergy & Immunology

## 2019-08-14 ENCOUNTER — Other Ambulatory Visit: Payer: Self-pay | Admitting: Allergy & Immunology

## 2019-08-16 ENCOUNTER — Other Ambulatory Visit: Payer: Self-pay

## 2019-08-16 NOTE — Telephone Encounter (Signed)
New prescription for [facility] sent to [facility] pharmacy x 1 with 5 refills via fax

## 2019-08-24 ENCOUNTER — Encounter: Payer: Self-pay | Admitting: Allergy & Immunology

## 2019-08-24 ENCOUNTER — Ambulatory Visit: Payer: 59 | Admitting: Allergy & Immunology

## 2019-08-24 ENCOUNTER — Telehealth (INDEPENDENT_AMBULATORY_CARE_PROVIDER_SITE_OTHER): Payer: 59 | Admitting: Allergy & Immunology

## 2019-08-24 DIAGNOSIS — J302 Other seasonal allergic rhinitis: Secondary | ICD-10-CM

## 2019-08-24 DIAGNOSIS — K219 Gastro-esophageal reflux disease without esophagitis: Secondary | ICD-10-CM | POA: Diagnosis not present

## 2019-08-24 DIAGNOSIS — J0100 Acute maxillary sinusitis, unspecified: Secondary | ICD-10-CM

## 2019-08-24 DIAGNOSIS — T7800XD Anaphylactic reaction due to unspecified food, subsequent encounter: Secondary | ICD-10-CM | POA: Diagnosis not present

## 2019-08-24 DIAGNOSIS — J4540 Moderate persistent asthma, uncomplicated: Secondary | ICD-10-CM | POA: Diagnosis not present

## 2019-08-24 DIAGNOSIS — J3089 Other allergic rhinitis: Secondary | ICD-10-CM | POA: Diagnosis not present

## 2019-08-24 MED ORDER — PREDNISONE 10 MG PO TABS: 10.0000 mg | ORAL_TABLET | Freq: Every day | ORAL | 1 refills | Status: AC

## 2019-08-24 NOTE — Progress Notes (Signed)
RE: Nicole Mendoza MRN: [id number] DOB: [birth year] Date of Telemedicine Visit: 08/24/2019  Referring provider: Rubie Maid, MD Primary care provider: Rubie Maid, MD  Chief Complaint: Follow-up, Allergies, and Asthma   Telemedicine Follow Up Visit via Telephone: I connected with Nicole Mendoza for a follow up on 08/25/19 by telephone and verified that I am speaking with the correct person using two identifiers.   I discussed the limitations, risks, security and privacy concerns of performing an evaluation and management service by telephone and the availability of in person appointments. I also discussed with the patient that there may be a patient responsible charge related to this service. The patient expressed understanding and agreed to proceed.  Patient is at home.  Provider is at the office.  Visit start time: 4:21 PM Visit end time: 5:17 PM Insurance consent/check in by: Nicole Mendoza consent and medical assistant/nurse: Nicole Mendoza  History of Present Illness:  She is a 29 y.o. female, who is being followed for mild persistent asthma as well as recurrent infections, seasonal and perennial allergic rhinitis, reflux, and adverse food reactions. Her previous allergy office visit was in February 2021 with myself.  At that time, she was actually doing very well.  It was just a regular follow-up visit which I never had with Nicole Mendoza.  She had no issues at all.  She was doing well on Asmanex for her asthma.  Her antihistamine and Singulair were covering her allergy symptoms.  She was also on [facility].  She continued to have reflux symptoms, but was on pantoprazole and famotidine with as needed Carafate.  She is followed by gastroenterology.  Shortly before the last visit, she was also diagnosed with prediabetes.  She had changed her diet and had already lost about 20 pounds.  Her blood sugars ranged from 90-130.  Since last visit, she reports that she is having decreased energy for a few  days. Her glucose has been elevated lately. She tells me that after lunch she was 136, which is higher than it has been. Her symptoms have been going for 3-4 days. Saturday was the worst. She tells me that her eyes were itching and watering and burning.   She did use some sample eye drops (Zerviate). She is using her nose spray and sinus rinses twice daily. She denies fever and the same signs and symptoms of COVID-19.  She previously had COVID-19 during the fall 2020.  Her last antibiotic course was December 2020.  She denies sick contacts, but her husband is out and about with his job as a soft delivery person.  Her sister is also a patient of mine and she is sick all the time as well.  They have a very close family, so illnesses tend to travel fast.  She does report some shortness of breath.  She has been using her rescue inhaler with increased frequency.  She remains on the Asmanex 2 puffs twice daily.  She is never been able to tolerate a long-acting beta agonist.  She has failed Symbicort, Dulera, and Advair as well as Breo.  She tends to have anxiety from the long-acting beta agonist.  She is not interested in trying any new asthma controller medications. She is not interested in steroids since she does not want to throw her blood sugars out of wack.   Otherwise, there have been no changes to her past medical history, surgical history, family history, or social history.  Assessment and Plan:  Nicole Mendoza is a 29 y.o. female with:  Mild persistent asthma - well controlled on the Asmanex (holding Fasenra)  Recurrent infections- improved following Pneumovax (protection to 3/23 serotypes initially to 16/23 serotypes post-vaccination of Pneumococcus),but now with worsening frequency of infections  Seasonal and perennial allergic rhinitis(trees, weeds, grasses and dust mites) - with acute exacerbation of her symptoms  Gastroesophageal reflux disease- onpantoprazoleand famotidineand PRN  carafate   Adverse food reactions -resolvedwith minimizing exposure to food additives and avoiding peanuts/tree nuts  Snoring with perceived poor sleep quality-improved with use of the CPAP  Possible migraines ?  Oral candidiasis   1. Seasonal and perennial allergic rhinitis (trees, weeds, grasses and dust mites) - Continue with: Zyrtec (cetirizine) 10mg  tablet once daily, Singulair (montelukast) 10mg  daily + Xhance two sprays per nostril twice daily.   - Start prednisone 10mg  twice daily for seven days.   2. Adverse food reaction (tree nuts, peanuts)  - Continue to avoid peanuts and tree nuts.  Nicole Mendoza is up to date.     3. Mild persistent asthma, uncomplicated - Lung function th - Daily controller medication(s): Asmanex 254mcg two puffs twice daily + Singulair 10mg   - Prior to physical activity: ProAir 2 puffs 10-15 minutes before physical activity. - Rescue medications: ProAir 4 puffs every 4-6 hours as needed - Changes during respiratory infections or worsening symptoms: Increase Asmanex 294mcg to 4 puffs twice daily for ONE TO TWO WEEKS. - Asthma control goals:  * Full participation in all desired activities (may need albuterol before activity) * Albuterol use two time or less a week on average (not counting use with activity) * Cough interfering with sleep two time or less a month * Oral steroids no more than once a year * No hospitalizations  4. Follow up in three months or earlier if needed. This can be an in-person, a virtual Webex or a telephone follow up visit.    Diagnostics: None.  Medication List:  Current Outpatient Medications  Medication Sig Dispense Refill  . Ascorbic Acid (SUPER C COMPLEX PO)     . cetirizine (ZYRTEC) 10 MG tablet Take by mouth.    . EPINEPHrine (AUVI-Q) 0.3 mg/0.3 mL IJ SOAJ injection Use as directed for severe allergic reactions 4 each 1  . famotidine (PEPCID) 20 MG tablet TAKE 1 TABLET BY MOUTH TWICE A DAY 60 tablet 5  .  ferrous sulfate 325 (65 FE) MG tablet     . Fluticasone Propionate (XHANCE) 93 MCG/ACT EXHU Place 2 sprays into both nostrils 2 (two) times daily. 32 mL 5  . hydrochlorothiazide (HYDRODIURIL) 25 MG tablet Take 25 mg by mouth.    . levalbuterol (XOPENEX HFA) 45 MCG/ACT inhaler Inhale 2 puffs into the lungs every 6 (six) hours as needed for wheezing. 1 Inhaler 1  . levalbuterol (XOPENEX) 1.25 MG/3ML nebulizer solution USE 1 VIAL BY NEBULIZATION EVERY 6 (SIX) HOURS AS NEEDED FOR WHEEZING. 90 mL 1  . metoprolol succinate (TOPROL-XL) 100 MG 24 hr tablet Take 100 mg by mouth daily. Take with or immediately following a meal.    . mometasone (ASMANEX, 30 METERED DOSES,) 220 MCG/INH inhaler Inhale 2 puffs into the lungs 2 (two) times daily. 1 Inhaler 5  . montelukast (SINGULAIR) 10 MG tablet Take 1 tablet (10 mg total) by mouth daily. 30 tablet 5  . norethindrone (ORTHO MICRONOR) 0.35 MG tablet Take 0.35 mg by mouth.    . ondansetron (ZOFRAN) 4 MG tablet Take 4 mg by mouth every 8 (eight) hours as needed for nausea or vomiting.    Marland Kitchen  pantoprazole (PROTONIX) 40 MG tablet Take 40 mg by mouth daily.  3  . potassium chloride SA (K-DUR) 20 MEQ tablet Take 1 tablet (20 mEq total) by mouth 2 (two) times daily for 5 days. 10 tablet 0  . Probiotic Product ([place]) CAPS     . sucralfate (CARAFATE) 1 g tablet TAKE 1 TABLET (1 G TOTAL) BY MOUTH 4 (FOUR) TIMES DAILY - WITH MEALS AND AT BEDTIME. 120 tablet 4  . cloNIDine (CATAPRES) 0.1 MG tablet TAKE 1 TABLET BY MOUTH 2 TIMES DAILY AS NEEDED.    Marland Kitchen predniSONE (DELTASONE) 10 MG tablet Take 1 tablet (10 mg total) by mouth daily with breakfast for 7 days. 7 tablet 1   No current facility-administered medications for this visit.   Allergies: Allergies  Allergen Reactions  . Budesonide-Formoterol Fumarate Other (See Comments)    Causes Hypertension  . Penicillins Hives  . Albuterol Palpitations  . Losartan Rash   I reviewed her past medical history,  social history, family history, and environmental history and no significant changes have been reported from previous visits.  Review of Systems  Constitutional: Positive for fatigue. Negative for activity change, appetite change, chills, fever and unexpected weight change.  HENT: Negative for congestion, postnasal drip, rhinorrhea, sinus pressure and sore throat.   Eyes: Negative for pain, discharge, redness and itching.  Respiratory: Negative for shortness of breath, wheezing and stridor.   Gastrointestinal: Negative for diarrhea, nausea and vomiting.  Endocrine: Negative for cold intolerance, heat intolerance and polydipsia.  Musculoskeletal: Negative for arthralgias, joint swelling and myalgias.  Skin: Negative for rash.  Allergic/Immunologic: Positive for environmental allergies and food allergies.    Objective:  Physical exam not obtained as encounter was done via telephone.   Previous notes and tests were reviewed.  I discussed the assessment and treatment plan with the patient. The patient was provided an opportunity to ask questions and all were answered. The patient agreed with the plan and demonstrated an understanding of the instructions.   The patient was advised to call back or seek an in-person evaluation if the symptoms worsen or if the condition fails to improve as anticipated.  I provided 56 minutes of non-face-to-face time during this encounter.  It was my pleasure to participate in [place] care today. Please feel free to contact me with any questions or concerns.   Sincerely,  Valentina Shaggy, MD

## 2019-08-24 NOTE — Patient Instructions (Addendum)
1. Seasonal and perennial allergic rhinitis (trees, weeds, grasses and dust mites) - Continue with: Zyrtec (cetirizine) 10mg  tablet once daily, Singulair (montelukast) 10mg  daily + Xhance two sprays per nostril twice daily.   - Start prednisone 10mg  twice daily for seven days.   2. Adverse food reaction (tree nuts, peanuts)  - Continue to avoid peanuts and tree nuts.  Nicole Mendoza is up to date.     3. Mild persistent asthma, uncomplicated - We are not going to make any medication changes at this time.  - Daily controller medication(s): Asmanex 241mcg two puffs twice daily + Singulair 10mg   - Prior to physical activity: ProAir 2 puffs 10-15 minutes before physical activity. - Rescue medications: ProAir 4 puffs every 4-6 hours as needed - Changes during respiratory infections or worsening symptoms: Increase Asmanex 230mcg to 4 puffs twice daily for ONE TO TWO WEEKS. - Asthma control goals:  * Full participation in all desired activities (may need albuterol before activity) * Albuterol use two time or less a week on average (not counting use with activity) * Cough interfering with sleep two time or less a month * Oral steroids no more than once a year * No hospitalizations  4. No follow-ups on file. This can be an in-person, a virtual Webex or a telephone follow up visit.   Please inform us of any Emergency Department visits, hospitalizations, or changes in symptoms. Call us before going to the ED for breathing or allergy symptoms since we might be able to fit you in for a sick visit. Feel free to contact us anytime with any questions, problems, or concerns.  It was a pleasure to talk to you today today!  Websites that have reliable patient information: 1. American Academy of Asthma, Allergy, and Immunology: www.aaaai.org 2. Food Allergy Research and Education (FARE): foodallergy.org 3. Mothers of Asthmatics: http://www.asthmacommunitynetwork.org 4. American College of Allergy, Asthma, and  Immunology: www.acaai.org   COVID-19 Vaccine Information can be found at: ShippingScam.co.uk For questions related to vaccine distribution or appointments, please email vaccine@[email or web address] or call 3467914995.     "Like" [place] on Facebook and Instagram for our latest updates!        Make sure you are registered to vote! If you have moved or changed any of your contact information, you will need to get this updated before voting!  In some cases, you MAY be able to register to vote online: [email or web address]

## 2019-08-25 ENCOUNTER — Encounter: Payer: Self-pay | Admitting: Allergy & Immunology

## 2019-08-30 ENCOUNTER — Telehealth: Payer: Self-pay

## 2019-08-30 MED ORDER — CEFDINIR 300 MG PO CAPS: 300.0000 mg | ORAL_CAPSULE | Freq: Two times a day (BID) | ORAL | 0 refills | Status: AC

## 2019-08-30 NOTE — Telephone Encounter (Signed)
Cefdinir 300 mg capsules bid x10 days sent to pharmacy per Dr. Gillermina Hu request.

## 2019-08-31 ENCOUNTER — Telehealth: Payer: Self-pay | Admitting: Allergy & Immunology

## 2019-08-31 MED ORDER — ASMANEX (30 METERED DOSES) 220 MCG/INH IN AEPB: 2.0000 | INHALATION_SPRAY | Freq: Two times a day (BID) | RESPIRATORY_TRACT | 5 refills | Status: DC

## 2019-08-31 NOTE — Telephone Encounter (Signed)
John

## 2019-08-31 NOTE — Telephone Encounter (Signed)
I am guessing that was a Dragon thing. I have no idea.   Salvatore Marvel, MD Allergy and [facility] [place]

## 2019-08-31 NOTE — Telephone Encounter (Signed)
Patient called and needs to have asmanex called into [facility]. [phone]

## 2019-08-31 NOTE — Telephone Encounter (Signed)
Call to patient to make her aware that prescription has been sent into the pharmacy. Pt verbalized understanding, call ended.

## 2019-08-31 NOTE — Telephone Encounter (Signed)
Referral has been faxed to their office for review. I was unable to find a work que to put the referral in epic.  Fax: [phone] Dr Ernst Bowler is the patient aware of this referral, do I need to send her a mychart message?  Thanks

## 2019-08-31 NOTE — Telephone Encounter (Signed)
She is aware of the referral.  Salvatore Marvel, MD Allergy and [place] [facility]

## 2019-08-31 NOTE — Telephone Encounter (Signed)
Patient reached out to me over the weekend.  We had treated her with a low-dose prednisone burst last week to see if this would help with her sinusitis, which I felt at the time was allergy mediated.  She was feeling better towards the end of the week, but then developed worsening symptoms over the weekend.  She continues to have a constellation of symptoms including fatigue and ongoing brain fog.  I think it would be worth a referral to the [facility] to if she would benefit a second opinion from their perspective.    She has reached out to her PCP regarding the symptoms, and he continues to perseverate on her elevated glucose levels.  However, her hemoglobin A1c has been normal.  She has made some dietary changes and is increasing physical activity, but she is feeling no benefits from this.  [name] - can you reach out to the below phone # to see what the referral process for this clinic is?   Phone: [phone] and [phone]   Salvatore Marvel, MD Allergy and [facility] [place]

## 2019-09-01 ENCOUNTER — Ambulatory Visit (INDEPENDENT_AMBULATORY_CARE_PROVIDER_SITE_OTHER): Payer: 59 | Admitting: Nurse Practitioner

## 2019-09-01 ENCOUNTER — Other Ambulatory Visit: Payer: Self-pay

## 2019-09-01 ENCOUNTER — Other Ambulatory Visit: Payer: Self-pay | Admitting: *Deleted

## 2019-09-01 VITALS — BP 128/78 | HR 87 | Temp 97.8°F | Ht 68.0 in | Wt 287.5 lb

## 2019-09-01 DIAGNOSIS — Z8616 Personal history of COVID-19: Secondary | ICD-10-CM

## 2019-09-01 DIAGNOSIS — R5383 Other fatigue: Secondary | ICD-10-CM

## 2019-09-01 DIAGNOSIS — K115 Sialolithiasis: Secondary | ICD-10-CM | POA: Diagnosis not present

## 2019-09-01 DIAGNOSIS — R000 Tachycardia, unspecified: Secondary | ICD-10-CM | POA: Diagnosis not present

## 2019-09-01 DIAGNOSIS — B370 Candidal stomatitis: Secondary | ICD-10-CM | POA: Diagnosis not present

## 2019-09-01 DIAGNOSIS — J45909 Unspecified asthma, uncomplicated: Secondary | ICD-10-CM | POA: Insufficient documentation

## 2019-09-01 MED ORDER — MAGIC MOUTHWASH: 5.0000 mL | Freq: Four times a day (QID) | ORAL | 0 refills | Status: AC

## 2019-09-01 NOTE — Progress Notes (Signed)
@Patient  ID: Nicole Mendoza, female    DOB: 07-Jul-1990, 29 y.o.   MRN: [id number]  Chief Complaint  Patient presents with  . Post COVID    Postive: 12/13/2018 Sx: "Fatigue spellls" unable to function, completely knocks her out.     Referring provider: Rubie Maid, MD  29 year old female with hypertension, GERD asthma, sleep apnea (compliant with CPAP) and history of Covid (June 2020).  HPI  Patient presents today for post covid care center visit. She states that she had covid in June 2020. She complains today of ongoing fatigue. She states that this comes in spells. Some days she cannot work due to extreme fatigue. Patient states that she is sleeping well - has been seen by neurology and diagnosed with sleep apnea. She is compliant with CPAP. She does see asthma specialist. She has noticed increased heart rate at times during these spells of extreme fatigue. She admits that she is inactive - does not exercise. She is compliant with medications and takes supplements - vitamin C, Vitamin D, and prenatal vitamin. She does state that she has increased stress in her life right now. She has recently moved in with her parents because she is in the process of building a new home. Denies f/c/s, n/v/d, hemoptysis, PND, leg swelling, denies chest pain. Patient also states that she still has trouble tasting certain foods - she has seen ENT for this issue. She admits that she does not drink enough water.  Patient also complains today of pain and swelling under her left tonsil area. She states that she thinks she may have thrush as well. She gets thrush often.        Allergies  Allergen Reactions  . Budesonide-Formoterol Fumarate Other (See Comments)    Causes Hypertension  . Penicillins Hives  . Albuterol Palpitations  . Losartan Rash    Immunization History  Administered Date(s) Administered  . Pneumococcal Polysaccharide-23 05/12/2018    Past Medical History:  Diagnosis Date  .  Allergic rhinitis   . Angio-edema   . Asthma   . Fatty liver   . GERD (gastroesophageal reflux disease)   . Hypertension   . Recurrent upper respiratory infection (URI)   . Tachycardia     Tobacco History: Social History   Tobacco Use  Smoking Status Never Smoker  Smokeless Tobacco Never Used   Counseling given: Yes   Outpatient Encounter Medications as of 09/01/2019  Medication Sig  . cefdinir (OMNICEF) 300 MG capsule Take 1 capsule (300 mg total) by mouth 2 (two) times daily for 10 days.  . cetirizine (ZYRTEC) 10 MG tablet Take by mouth.  . EPINEPHrine (AUVI-Q) 0.3 mg/0.3 mL IJ SOAJ injection Use as directed for severe allergic reactions  . famotidine (PEPCID) 20 MG tablet TAKE 1 TABLET BY MOUTH TWICE A DAY  . ferrous sulfate 325 (65 FE) MG tablet 3 (three) times a week.   . Fluticasone Propionate (XHANCE) 93 MCG/ACT EXHU Place 2 sprays into both nostrils 2 (two) times daily.  . hydrochlorothiazide (HYDRODIURIL) 25 MG tablet Take 25 mg by mouth.  . levalbuterol (XOPENEX HFA) 45 MCG/ACT inhaler Inhale 2 puffs into the lungs every 6 (six) hours as needed for wheezing.  . levalbuterol (XOPENEX) 1.25 MG/3ML nebulizer solution USE 1 VIAL BY NEBULIZATION EVERY 6 (SIX) HOURS AS NEEDED FOR WHEEZING.  . metoprolol succinate (TOPROL-XL) 100 MG 24 hr tablet Take 100 mg by mouth daily. Take with or immediately following a meal.  . mometasone (ASMANEX, 30 METERED  DOSES,) 220 MCG/INH inhaler Inhale 2 puffs into the lungs 2 (two) times daily.  . montelukast (SINGULAIR) 10 MG tablet Take 1 tablet (10 mg total) by mouth daily.  . norethindrone (ORTHO MICRONOR) 0.35 MG tablet Take 0.35 mg by mouth.  . ondansetron (ZOFRAN) 4 MG tablet Take 4 mg by mouth every 8 (eight) hours as needed for nausea or vomiting.  . pantoprazole (PROTONIX) 40 MG tablet Take 40 mg by mouth daily.  . potassium chloride SA (K-DUR) 20 MEQ tablet Take 1 tablet (20 mEq total) by mouth 2 (two) times daily for 5 days.  .  Probiotic Product ([place]) CAPS   . sucralfate (CARAFATE) 1 g tablet TAKE 1 TABLET (1 G TOTAL) BY MOUTH 4 (FOUR) TIMES DAILY - WITH MEALS AND AT BEDTIME.  Marland Kitchen Ascorbic Acid (SUPER C COMPLEX PO)   . cloNIDine (CATAPRES) 0.1 MG tablet TAKE 1 TABLET BY MOUTH 2 TIMES DAILY AS NEEDED.  . magic mouthwash SOLN Take 5 mLs by mouth 4 (four) times daily for 7 days.   No facility-administered encounter medications on file as of 09/01/2019.     Review of Systems  Review of Systems  Constitutional: Positive for activity change (decreased) and fatigue. Negative for chills, diaphoresis and fever.  Respiratory: Positive for shortness of breath (chronic with asthma). Negative for cough and wheezing.   Cardiovascular: Negative for chest pain, palpitations and leg swelling.  Psychiatric/Behavioral: Negative for decreased concentration.       Physical Exam  BP 128/78 (BP Location: Right Arm, Patient Position: Sitting, Cuff Size: Large)   Pulse 87   Temp 97.8 F (36.6 C)   Ht 5' 8"  (1.727 m)   Wt 287 lb 8 oz (130.4 kg)   LMP 08/27/2019 (Exact Date)   SpO2 97%   BMI 43.71 kg/m   Wt Readings from Last 5 Encounters:  09/01/19 287 lb 8 oz (130.4 kg)  08/10/19 281 lb 9.6 oz (127.7 kg)  05/27/19 299 lb 3.2 oz (135.7 kg)  04/08/19 (!) 304 lb 6.4 oz (138.1 kg)  12/25/18 280 lb (127 kg)     Physical Exam Vitals and nursing note reviewed.  Constitutional:      General: She is not in acute distress.    Appearance: She is well-developed.  Neck:      Comments: Tender to palpation with slight swelling in the are marked.  Cardiovascular:     Rate and Rhythm: Normal rate and regular rhythm.  Pulmonary:     Effort: Pulmonary effort is normal.     Breath sounds: Normal breath sounds. No wheezing or rhonchi.  Musculoskeletal:     Cervical back: Full passive range of motion without pain, normal range of motion and neck supple.     Right lower leg: No edema.     Left lower leg: No edema.   Neurological:     Mental Status: She is alert and oriented to person, place, and time.  Psychiatric:        Mood and Affect: Mood normal.        Behavior: Behavior normal.        Assessment & Plan:   History of COVID-19 Tachycardia Thrush Salivary Fatigue  Tachycardia:  Patient was walked in office today. Heart rate was noted to be elevated with exertion. EKG overall looked normal. Patient states that she does experience periods of heart racing when she has her periods of extreme fatigue.  Thrush/ Sialadenitis: Patient is experiencing mouth irritation and white patches  noted in mouth. She is having pain under left tonsil and swelling under jaw line.  Fatigue: Patient is inactive. Fatigue is most likely due to weight and deconditioning. She has started on CPAP for sleep apnea and states that she does sleep well. States that she is compliant with CPAP. She admits that she does not drink enough water.   Plan:  Thrush/ Sialadenitis:  Will order miracle mouth wash  ? Suck on a lemon candy to stimulate the flow of saliva. ? Put a warm compress over the gland. ? Gently massage the gland. Note: patient has recently started antibiotic from asthma specialist - please continue this medication  Fatigue:  Will order labs and call with results  Stay active - offered PT - patient declined at this time - please start walking each day - start with 15 mins each day and increase as tolerated  May trial gluten free diet - may help with reducing systemic inflammation that could possibly still be lingering from Covid  Continue vitamins and supplements  Stay well hydrated  EKG showed: NSR  Walked in office today: Sats remained in high 90's and heart rate went up to 113 BPM  Will refer to cardiology: intermittent tachycardia, fatigue, history of covid   Follow up: Covid clinic in 1 month   Gluten-Free Diet   The gluten-free diet includes all foods that do not contain gluten.  Gluten is a protein that is found in wheat, rye, barley, and some other grains.  Following the gluten-free diet requires some planning. It can be challenging at first, but it gets easier with time and practice. There are more gluten-free options available today than ever before. If you need help finding gluten-free foods or if you have questions, talk with your diet and nutrition specialist (registered dietitian) or your health care provider. What do I need to know about a gluten-free diet?  All fruits, vegetables, and meats are safe to eat and do not contain gluten.  When grocery shopping, start by shopping in the produce, meat, and dairy sections. These sections are more likely to contain gluten-free foods. Then move to the aisles that contain packaged foods if you need to.  Read all food labels. Gluten is often added to foods. Always check the ingredient list and look for warnings, such as "may contain gluten."  Talk with your dietitian or health care provider before taking a gluten-free multivitamin or mineral supplement.  Be aware of gluten-free foods having contact with foods that contain gluten (cross-contamination). This can happen at home and with any processed foods. ? Talk with your health care provider or dietitian about how to reduce the risk of cross-contamination in your home. ? If you have questions about how a food is processed, ask the manufacturer. What key words help to identify gluten? Foods that list any of these key words on the label usually contain gluten:  Wheat, flour, enriched flour, bromated flour, white flour, durum flour, graham flour, phosphated flour, self-rising flour, semolina, farina, barley (malt), rye, and oats.  Starch, dextrin, modified food starch, or cereal.  Thickening, fillers, or emulsifiers.  Malt flavoring, malt extract, or malt syrup.  Hydrolyzed vegetable protein. In the [place] packaged foods that are gluten-free are required to be labeled  "GF." These foods should be easy to identify and are safe to eat. In the [place] food companies are also required to list common food allergens, including wheat, on their labels. Recommended foods Grains  Amaranth, bean flours, 100% buckwheat flour,  corn, millet, nut flours or nut meals, GF oats, quinoa, rice, sorghum, teff, rice wafers, pure cornmeal tortillas, popcorn, and hot cereals made from cornmeal. Hominy, rice, wild rice. Some Asian rice noodles or bean noodles. Arrowroot starch, corn bran, corn flour, corn germ, cornmeal, corn starch, potato flour, potato starch flour, and rice bran. Plain, brown, and sweet rice flours. Rice polish, soy flour, and tapioca starch. Vegetables  All plain fresh, frozen, and canned vegetables. Fruits  All plain fresh, frozen, canned, and dried fruits, and 100% fruit juices. Meats and other protein foods  All fresh beef, pork, poultry, fish, seafood, and eggs. Fish canned in water, oil, brine, or vegetable broth. Plain nuts and seeds, peanut butter. Some lunch meat and some frankfurters. Dried beans, dried peas, and lentils. Dairy  Fresh plain, dry, evaporated, or condensed milk. Cream, butter, sour cream, whipping cream, and most yogurts. Unprocessed cheese, most processed cheeses, some cottage cheese, some cream cheeses. Beverages  Coffee, tea, most herbal teas. Carbonated beverages and some root beers. Wine, sake, and distilled spirits, such as gin, vodka, and whiskey. Most hard ciders. Fats and oils  Butter, margarine, vegetable oil, hydrogenated butter, olive oil, shortening, lard, cream, and some mayonnaise. Some commercial salad dressings. Olives. Sweets and desserts  Sugar, honey, some syrups, molasses, jelly, and jam. Plain hard candy, marshmallows, and gumdrops. Pure cocoa powder. Plain chocolate. Custard and some pudding mixes. Gelatin desserts, sorbets, frozen ice pops, and sherbet. Cake, cookies, and other desserts prepared with allowed  flours. Some commercial ice creams. Cornstarch, tapioca, and rice puddings. Seasoning and other foods  Some canned or frozen soups. Monosodium glutamate (MSG). Cider, rice, and wine vinegar. Baking soda and baking powder. Cream of tartar. Baking and nutritional yeast. Certain soy sauces made without wheat (ask your dietitian about specific brands that are allowed). Nuts, coconut, and chocolate. Salt, pepper, herbs, spices, flavoring extracts, imitation or artificial flavorings, natural flavorings, and food colorings. Some medicines and supplements. Some lip glosses and other cosmetics. Rice syrups. The items listed may not be a complete list. Talk with your dietitian about what dietary choices are best for you. Foods to avoid Grains  Barley, bran, bulgur, couscous, cracked wheat, [place], farro, graham, malt, matzo, semolina, wheat germ, and all wheat and rye cereals including spelt and kamut. Cereals containing malt as a flavoring, such as rice cereal. Noodles, spaghetti, macaroni, most packaged rice mixes, and all mixes containing wheat, rye, barley, or triticale. Vegetables  Most creamed vegetables and most vegetables canned in sauces. Some commercially prepared vegetables and salads. Fruits  Thickened or prepared fruits and some pie fillings. Some fruit snacks and fruit roll-ups. Meats and other protein foods  Any meat or meat alternative containing wheat, rye, barley, or gluten stabilizers. These are often marinated or packaged meats and lunch meats. Bread-containing products, such as Swiss steak, croquettes, meatballs, and meatloaf. Most tuna canned in vegetable broth and [place] with hydrolyzed vegetable protein (HVP) injected as part of the basting. [place]. Imitation fish. Eggs in sauces made from ingredients to avoid. Dairy  Commercial chocolate milk drinks and malted milk. Some non-dairy creamers. Any cheese product containing ingredients to avoid. Beverages  Certain cereal beverages.  Beer, ale, malted milk, and some root beers. Some hard ciders. Some instant flavored coffees. Some herbal teas made with barley or with barley malt added. Fats and oils  Some commercial salad dressings. Sour cream containing modified food starch. Sweets and desserts  Some toffees. Chocolate-coated nuts (may be rolled in wheat flour) and  some commercial candies and candy bars. Most cakes, cookies, donuts, pastries, and other baked goods. Some commercial ice cream. Ice cream cones. Commercially prepared mixes for cakes, cookies, and other desserts. Bread pudding and other puddings thickened with flour. Products containing brown rice syrup made with barley malt enzyme. Desserts and sweets made with malt flavoring. Seasoning and other foods  Some curry powders, some dry seasoning mixes, some gravy extracts, some meat sauces, some ketchups, some prepared mustards, and horseradish. Certain soy sauces. Malt vinegar. Bouillon and bouillon cubes that contain HVP. Some chip dips, and some chewing gum. Yeast extract. Brewer's yeast. Caramel color. Some medicines and supplements. Some lip glosses and other cosmetics. The items listed may not be a complete list. Talk with your dietitian about what dietary choices are best for you. Summary  Gluten is a protein that is found in wheat, rye, barley, and some other grains. The gluten-free diet includes all foods that do not contain gluten.  If you need help finding gluten-free foods or if you have questions, talk with your diet and nutrition specialist (registered dietitian) or your health care provider.  Read all food labels. Gluten is often added to foods. Always check the ingredient list and look for warnings, such as "may contain gluten." This information is not intended to replace advice given to you by your health care provider. Make sure you discuss any questions you have with your health care provider. Document Revised: 05/16/2017 Document Reviewed:  03/18/2016 Elsevier Patient Education  [place].   Fatigue If you have fatigue, you feel tired all the time and have a lack of energy or a lack of motivation. Fatigue may make it difficult to start or complete tasks because of exhaustion. In general, occasional or mild fatigue is often a normal response to activity or life. However, long-lasting (chronic) or extreme fatigue may be a symptom of a medical condition. Follow these instructions at home: General instructions  Watch your fatigue for any changes.  Go to bed and get up at the same time every day.  Avoid fatigue by pacing yourself during the day and getting enough sleep at night.  Maintain a healthy weight. Medicines  Take over-the-counter and prescription medicines only as told by your health care provider.  Take a multivitamin, if told by your health care provider.  Do not use herbal or dietary supplements unless they are approved by your health care provider. Activity   Exercise regularly, as told by your health care provider.  Use or practice techniques to help you relax, such as yoga, tai chi, meditation, or massage therapy. Eating and drinking   Avoid heavy meals in the evening.  Eat a well-balanced diet, which includes lean proteins, whole grains, plenty of fruits and vegetables, and low-fat dairy products.  Avoid consuming too much caffeine.  Avoid the use of alcohol.  Drink enough fluid to keep your urine pale yellow. Lifestyle  Change situations that cause you stress. Try to keep your work and personal schedule in balance.  Do not use any products that contain nicotine or tobacco, such as cigarettes and e-cigarettes. If you need help quitting, ask your health care provider.  Do not use drugs. Contact a health care provider if:  Your fatigue does not get better.  You have a fever.  You suddenly lose or gain weight.  You have headaches.  You have trouble falling asleep or sleeping  through the night.  You feel angry, guilty, anxious, or sad.  You are  unable to have a bowel movement (constipation).  Your skin is dry.  You have swelling in your legs or another part of your body. Get help right away if:  You feel confused.  Your vision is blurry.  You feel faint or you pass out.  You have a severe headache.  You have severe pain in your abdomen, your back, or the area between your waist and hips (pelvis).  You have chest pain, shortness of breath, or an irregular or fast heartbeat.  You are unable to urinate, or you urinate less than normal.  You have abnormal bleeding, such as bleeding from the rectum, vagina, nose, lungs, or nipples.  You vomit blood.  You have thoughts about hurting yourself or others. If you ever feel like you may hurt yourself or others, or have thoughts about taking your own life, get help right away. You can go to your nearest emergency department or call:  Your local emergency services (911 in the [place]  A suicide crisis helpline, such as the [place] at 434 353 4761. This is open 24 hours a day. Summary  If you have fatigue, you feel tired all the time and have a lack of energy or a lack of motivation.  Fatigue may make it difficult to start or complete tasks because of exhaustion.  Long-lasting (chronic) or extreme fatigue may be a symptom of a medical condition.  Exercise regularly, as told by your health care provider.  Change situations that cause you stress. Try to keep your work and personal schedule in balance. This information is not intended to replace advice given to you by your health care provider. Make sure you discuss any questions you have with your health care provider. Document Revised: 12/23/2018 Document Reviewed: 02/26/2017 Elsevier Patient Education  2020 [place].    Salivary Stone  A salivary stone is a mineral deposit that builds up in the ducts that drain  your salivary glands. Most salivary stones are made of calcium. When a stone forms, saliva can back up into the gland and cause painful swelling. Your salivary glands are the glands that produce saliva. You have six major salivary glands. Each gland has a duct that carries saliva into your mouth. Saliva keeps your mouth moist and breaks down the food that you eat. It also helps prevent tooth decay. Two salivary glands are located just in front of your ears (parotid). The ducts for these glands open up inside your cheeks, near your back teeth. You also have two glands under your tongue (sublingual) and two glands under your jaw (submandibular). The ducts for these glands open under your tongue. A stone can form in any salivary gland. The most common place for a salivary stone to develop is in a submandibular salivary gland. What are the causes? Salivary stones may be caused by any condition that reduces the flow of saliva. It is not known why some people form stones. What increases the risk? You are more likely to develop this condition if:  You are female.  You do not drink enough water.  You smoke.  You have any of the following: ? High blood pressure. ? Gout. ? Diabetes. What are the signs or symptoms? The main sign of a salivary gland stone is sudden swelling of a salivary gland when eating. This usually happens under the jaw on one side. Other signs and symptoms may include:  Swelling of the cheek or under the tongue when eating.  Pain in the swollen  area.  Trouble chewing or swallowing.  Swelling that goes down after eating. How is this diagnosed? This condition may be diagnosed based on:  Your signs and symptoms.  A physical exam. In many cases, your health care provider will be able to feel the stone in a duct inside your mouth.  Imaging studies, such as: ? X-rays. ? Ultrasound. ? CT scan. ? MRI. You may need to see an ear, nose, and throat specialist (ENT or  otolaryngologist) for diagnosis and treatment. How is this treated? Treatment for this condition depends on the size of the stone.  A small stone that is not causing symptoms may be treated with home care.  For a stone that is large enough to cause symptoms, the treatment options may include: ? Probing and widening of the duct to allow the stone to pass. ? Inserting a thin, flexible scope (endoscope) into the duct to locate and remove the stone. ? Breaking up the stone with sound waves. ? Removing the entire salivary gland. Follow these instructions at home:  To relieve discomfort  Follow these instructions every few hours: ? Suck on a lemon candy to stimulate the flow of saliva. ? Put a warm compress over the gland. ? Gently massage the gland. General instructions  Drink enough fluid to keep your urine pale yellow.  Do not use any products that contain nicotine or tobacco, such as cigarettes and e-cigarettes. If you need help quitting, ask your health care provider.  Keep all follow-up visits as told by your health care provider. This is important. Contact a health care provider if:  You have pain and swelling in your face, jaw, or mouth after eating.  You have persistent swelling in any of these places: ? In front of your ear. ? Under your jaw. ? Inside your mouth. Get help right away if:  You have pain and swelling in your face, jaw, or mouth, and this is getting worse.  Your pain and swelling make it hard to swallow or breathe. Summary  A salivary stone is a mineral deposit that builds up in the ducts that drain your salivary glands.  When a stone forms, saliva can back up into the gland and cause painful swelling.  Salivary stones may be caused by any condition that reduces the flow of saliva.  Treatment for this condition depends on the size of the stone. This information is not intended to replace advice given to you by your health care provider. Make sure you  discuss any questions you have with your health care provider. Document Revised: 07/14/2017 Document Reviewed: 07/14/2017 Elsevier Patient Education  2020 [place], [place] 09/01/2019

## 2019-09-01 NOTE — Patient Instructions (Addendum)
Thrush/ Sialadenitis:  Will order miracle mouth wash  ? Suck on a lemon candy to stimulate the flow of saliva. ? Put a warm compress over the gland. ? Gently massage the gland.   Fatigue:  Will order labs and call with results  Stay active - offered PT - patient declined at this time - please start walking each day - start with 15 mins each day and increase as tolerated  May trial gluten free diet  Continue vitamins and supplements  Stay well hydrated  EKG showed: NSR  Walked in office today: Sats remained in high 90's and heart rate went up to 113 BPM  Will refer to cardiology: intermittent tachycardia, fatigue, history of covid   Follow up: Covid clinic in 1 month   Gluten-Free Diet   The gluten-free diet includes all foods that do not contain gluten. Gluten is a protein that is found in wheat, rye, barley, and some other grains.  Following the gluten-free diet requires some planning. It can be challenging at first, but it gets easier with time and practice. There are more gluten-free options available today than ever before. If you need help finding gluten-free foods or if you have questions, talk with your diet and nutrition specialist (registered dietitian) or your health care provider. What do I need to know about a gluten-free diet?  All fruits, vegetables, and meats are safe to eat and do not contain gluten.  When grocery shopping, start by shopping in the produce, meat, and dairy sections. These sections are more likely to contain gluten-free foods. Then move to the aisles that contain packaged foods if you need to.  Read all food labels. Gluten is often added to foods. Always check the ingredient list and look for warnings, such as "may contain gluten."  Talk with your dietitian or health care provider before taking a gluten-free multivitamin or mineral supplement.  Be aware of gluten-free foods having contact with foods that contain gluten  (cross-contamination). This can happen at home and with any processed foods. ? Talk with your health care provider or dietitian about how to reduce the risk of cross-contamination in your home. ? If you have questions about how a food is processed, ask the manufacturer. What key words help to identify gluten? Foods that list any of these key words on the label usually contain gluten:  Wheat, flour, enriched flour, bromated flour, white flour, durum flour, graham flour, phosphated flour, self-rising flour, semolina, farina, barley (malt), rye, and oats.  Starch, dextrin, modified food starch, or cereal.  Thickening, fillers, or emulsifiers.  Malt flavoring, malt extract, or malt syrup.  Hydrolyzed vegetable protein. In the [place] packaged foods that are gluten-free are required to be labeled "GF." These foods should be easy to identify and are safe to eat. In the [place] food companies are also required to list common food allergens, including wheat, on their labels. Recommended foods Grains  Amaranth, bean flours, 100% buckwheat flour, corn, millet, nut flours or nut meals, GF oats, quinoa, rice, sorghum, teff, rice wafers, pure cornmeal tortillas, popcorn, and hot cereals made from cornmeal. Hominy, rice, wild rice. Some Asian rice noodles or bean noodles. Arrowroot starch, corn bran, corn flour, corn germ, cornmeal, corn starch, potato flour, potato starch flour, and rice bran. Plain, brown, and sweet rice flours. Rice polish, soy flour, and tapioca starch. Vegetables  All plain fresh, frozen, and canned vegetables. Fruits  All plain fresh, frozen, canned, and dried fruits, and 100% fruit juices. Meats and  other protein foods  All fresh beef, pork, poultry, fish, seafood, and eggs. Fish canned in water, oil, brine, or vegetable broth. Plain nuts and seeds, peanut butter. Some lunch meat and some frankfurters. Dried beans, dried peas, and lentils. Dairy  Fresh plain, dry, evaporated, or  condensed milk. Cream, butter, sour cream, whipping cream, and most yogurts. Unprocessed cheese, most processed cheeses, some cottage cheese, some cream cheeses. Beverages  Coffee, tea, most herbal teas. Carbonated beverages and some root beers. Wine, sake, and distilled spirits, such as gin, vodka, and whiskey. Most hard ciders. Fats and oils  Butter, margarine, vegetable oil, hydrogenated butter, olive oil, shortening, lard, cream, and some mayonnaise. Some commercial salad dressings. Olives. Sweets and desserts  Sugar, honey, some syrups, molasses, jelly, and jam. Plain hard candy, marshmallows, and gumdrops. Pure cocoa powder. Plain chocolate. Custard and some pudding mixes. Gelatin desserts, sorbets, frozen ice pops, and sherbet. Cake, cookies, and other desserts prepared with allowed flours. Some commercial ice creams. Cornstarch, tapioca, and rice puddings. Seasoning and other foods  Some canned or frozen soups. Monosodium glutamate (MSG). Cider, rice, and wine vinegar. Baking soda and baking powder. Cream of tartar. Baking and nutritional yeast. Certain soy sauces made without wheat (ask your dietitian about specific brands that are allowed). Nuts, coconut, and chocolate. Salt, pepper, herbs, spices, flavoring extracts, imitation or artificial flavorings, natural flavorings, and food colorings. Some medicines and supplements. Some lip glosses and other cosmetics. Rice syrups. The items listed may not be a complete list. Talk with your dietitian about what dietary choices are best for you. Foods to avoid Grains  Barley, bran, bulgur, couscous, cracked wheat, [place], farro, graham, malt, matzo, semolina, wheat germ, and all wheat and rye cereals including spelt and kamut. Cereals containing malt as a flavoring, such as rice cereal. Noodles, spaghetti, macaroni, most packaged rice mixes, and all mixes containing wheat, rye, barley, or triticale. Vegetables  Most creamed vegetables and most  vegetables canned in sauces. Some commercially prepared vegetables and salads. Fruits  Thickened or prepared fruits and some pie fillings. Some fruit snacks and fruit roll-ups. Meats and other protein foods  Any meat or meat alternative containing wheat, rye, barley, or gluten stabilizers. These are often marinated or packaged meats and lunch meats. Bread-containing products, such as Swiss steak, croquettes, meatballs, and meatloaf. Most tuna canned in vegetable broth and [place] with hydrolyzed vegetable protein (HVP) injected as part of the basting. [place]. Imitation fish. Eggs in sauces made from ingredients to avoid. Dairy  Commercial chocolate milk drinks and malted milk. Some non-dairy creamers. Any cheese product containing ingredients to avoid. Beverages  Certain cereal beverages. Beer, ale, malted milk, and some root beers. Some hard ciders. Some instant flavored coffees. Some herbal teas made with barley or with barley malt added. Fats and oils  Some commercial salad dressings. Sour cream containing modified food starch. Sweets and desserts  Some toffees. Chocolate-coated nuts (may be rolled in wheat flour) and some commercial candies and candy bars. Most cakes, cookies, donuts, pastries, and other baked goods. Some commercial ice cream. Ice cream cones. Commercially prepared mixes for cakes, cookies, and other desserts. Bread pudding and other puddings thickened with flour. Products containing brown rice syrup made with barley malt enzyme. Desserts and sweets made with malt flavoring. Seasoning and other foods  Some curry powders, some dry seasoning mixes, some gravy extracts, some meat sauces, some ketchups, some prepared mustards, and horseradish. Certain soy sauces. Malt vinegar. Bouillon and bouillon cubes that contain HVP.  Some chip dips, and some chewing gum. Yeast extract. Brewer's yeast. Caramel color. Some medicines and supplements. Some lip glosses and other cosmetics. The  items listed may not be a complete list. Talk with your dietitian about what dietary choices are best for you. Summary  Gluten is a protein that is found in wheat, rye, barley, and some other grains. The gluten-free diet includes all foods that do not contain gluten.  If you need help finding gluten-free foods or if you have questions, talk with your diet and nutrition specialist (registered dietitian) or your health care provider.  Read all food labels. Gluten is often added to foods. Always check the ingredient list and look for warnings, such as "may contain gluten." This information is not intended to replace advice given to you by your health care provider. Make sure you discuss any questions you have with your health care provider. Document Revised: 05/16/2017 Document Reviewed: 03/18/2016 Elsevier Patient Education  [place].   Fatigue If you have fatigue, you feel tired all the time and have a lack of energy or a lack of motivation. Fatigue may make it difficult to start or complete tasks because of exhaustion. In general, occasional or mild fatigue is often a normal response to activity or life. However, long-lasting (chronic) or extreme fatigue may be a symptom of a medical condition. Follow these instructions at home: General instructions  Watch your fatigue for any changes.  Go to bed and get up at the same time every day.  Avoid fatigue by pacing yourself during the day and getting enough sleep at night.  Maintain a healthy weight. Medicines  Take over-the-counter and prescription medicines only as told by your health care provider.  Take a multivitamin, if told by your health care provider.  Do not use herbal or dietary supplements unless they are approved by your health care provider. Activity   Exercise regularly, as told by your health care provider.  Use or practice techniques to help you relax, such as yoga, tai chi, meditation, or massage  therapy. Eating and drinking   Avoid heavy meals in the evening.  Eat a well-balanced diet, which includes lean proteins, whole grains, plenty of fruits and vegetables, and low-fat dairy products.  Avoid consuming too much caffeine.  Avoid the use of alcohol.  Drink enough fluid to keep your urine pale yellow. Lifestyle  Change situations that cause you stress. Try to keep your work and personal schedule in balance.  Do not use any products that contain nicotine or tobacco, such as cigarettes and e-cigarettes. If you need help quitting, ask your health care provider.  Do not use drugs. Contact a health care provider if:  Your fatigue does not get better.  You have a fever.  You suddenly lose or gain weight.  You have headaches.  You have trouble falling asleep or sleeping through the night.  You feel angry, guilty, anxious, or sad.  You are unable to have a bowel movement (constipation).  Your skin is dry.  You have swelling in your legs or another part of your body. Get help right away if:  You feel confused.  Your vision is blurry.  You feel faint or you pass out.  You have a severe headache.  You have severe pain in your abdomen, your back, or the area between your waist and hips (pelvis).  You have chest pain, shortness of breath, or an irregular or fast heartbeat.  You are unable to urinate, or  you urinate less than normal.  You have abnormal bleeding, such as bleeding from the rectum, vagina, nose, lungs, or nipples.  You vomit blood.  You have thoughts about hurting yourself or others. If you ever feel like you may hurt yourself or others, or have thoughts about taking your own life, get help right away. You can go to your nearest emergency department or call:  Your local emergency services (911 in the [place]  A suicide crisis helpline, such as the [facility] at [phone]. This is open 24 hours a day. Summary  If  you have fatigue, you feel tired all the time and have a lack of energy or a lack of motivation.  Fatigue may make it difficult to start or complete tasks because of exhaustion.  Long-lasting (chronic) or extreme fatigue may be a symptom of a medical condition.  Exercise regularly, as told by your health care provider.  Change situations that cause you stress. Try to keep your work and personal schedule in balance. This information is not intended to replace advice given to you by your health care provider. Make sure you discuss any questions you have with your health care provider. Document Revised: 12/23/2018 Document Reviewed: 02/26/2017 Elsevier Patient Education  2020 [place].    Salivary Stone  A salivary stone is a mineral deposit that builds up in the ducts that drain your salivary glands. Most salivary stones are made of calcium. When a stone forms, saliva can back up into the gland and cause painful swelling. Your salivary glands are the glands that produce saliva. You have six major salivary glands. Each gland has a duct that carries saliva into your mouth. Saliva keeps your mouth moist and breaks down the food that you eat. It also helps prevent tooth decay. Two salivary glands are located just in front of your ears (parotid). The ducts for these glands open up inside your cheeks, near your back teeth. You also have two glands under your tongue (sublingual) and two glands under your jaw (submandibular). The ducts for these glands open under your tongue. A stone can form in any salivary gland. The most common place for a salivary stone to develop is in a submandibular salivary gland. What are the causes? Salivary stones may be caused by any condition that reduces the flow of saliva. It is not known why some people form stones. What increases the risk? You are more likely to develop this condition if:  You are female.  You do not drink enough water.  You smoke.  You have  any of the following: ? High blood pressure. ? Gout. ? Diabetes. What are the signs or symptoms? The main sign of a salivary gland stone is sudden swelling of a salivary gland when eating. This usually happens under the jaw on one side. Other signs and symptoms may include:  Swelling of the cheek or under the tongue when eating.  Pain in the swollen area.  Trouble chewing or swallowing.  Swelling that goes down after eating. How is this diagnosed? This condition may be diagnosed based on:  Your signs and symptoms.  A physical exam. In many cases, your health care provider will be able to feel the stone in a duct inside your mouth.  Imaging studies, such as: ? X-rays. ? Ultrasound. ? CT scan. ? MRI. You may need to see an ear, nose, and throat specialist (ENT or otolaryngologist) for diagnosis and treatment. How is this treated? Treatment for this  condition depends on the size of the stone.  A small stone that is not causing symptoms may be treated with home care.  For a stone that is large enough to cause symptoms, the treatment options may include: ? Probing and widening of the duct to allow the stone to pass. ? Inserting a thin, flexible scope (endoscope) into the duct to locate and remove the stone. ? Breaking up the stone with sound waves. ? Removing the entire salivary gland. Follow these instructions at home:  To relieve discomfort  Follow these instructions every few hours: ? Suck on a lemon candy to stimulate the flow of saliva. ? Put a warm compress over the gland. ? Gently massage the gland. General instructions  Drink enough fluid to keep your urine pale yellow.  Do not use any products that contain nicotine or tobacco, such as cigarettes and e-cigarettes. If you need help quitting, ask your health care provider.  Keep all follow-up visits as told by your health care provider. This is important. Contact a health care provider if:  You have pain and  swelling in your face, jaw, or mouth after eating.  You have persistent swelling in any of these places: ? In front of your ear. ? Under your jaw. ? Inside your mouth. Get help right away if:  You have pain and swelling in your face, jaw, or mouth, and this is getting worse.  Your pain and swelling make it hard to swallow or breathe. Summary  A salivary stone is a mineral deposit that builds up in the ducts that drain your salivary glands.  When a stone forms, saliva can back up into the gland and cause painful swelling.  Salivary stones may be caused by any condition that reduces the flow of saliva.  Treatment for this condition depends on the size of the stone. This information is not intended to replace advice given to you by your health care provider. Make sure you discuss any questions you have with your health care provider. Document Revised: 07/14/2017 Document Reviewed: 07/14/2017 Elsevier Patient Education  2020 [employer].

## 2019-09-01 NOTE — Assessment & Plan Note (Addendum)
Tachycardia Thrush Salivary Fatigue  Tachycardia:  Patient was walked in office today. Heart rate was noted to be elevated with exertion. EKG overall looked normal. Patient states that she does experience periods of heart racing when she has her periods of extreme fatigue.  Thrush/ Sialadenitis: Patient is experiencing mouth irritation and white patches noted in mouth. She is having pain under left tonsil and swelling under jaw line.  Fatigue: Patient is inactive. Fatigue is most likely due to weight and deconditioning. She has started on CPAP for sleep apnea and states that she does sleep well. States that she is compliant with CPAP. She admits that she does not drink enough water.   Plan:  Thrush/ Sialadenitis:  Will order miracle mouth wash  ? Suck on a lemon candy to stimulate the flow of saliva. ? Put a warm compress over the gland. ? Gently massage the gland. Note: patient has recently started antibiotic from asthma specialist - please continue this medication  Fatigue:  Will order labs and call with results  Stay active - offered PT - patient declined at this time - please start walking each day - start with 15 mins each day and increase as tolerated  May trial gluten free diet - may help with reducing systemic inflammation that could possibly still be lingering from Covid  Continue vitamins and supplements  Stay well hydrated  EKG showed: NSR  Walked in office today: Sats remained in high 90's and heart rate went up to 113 BPM  Will refer to cardiology: intermittent tachycardia, fatigue, history of covid   Follow up: Covid clinic in 1 month   Gluten-Free Diet   The gluten-free diet includes all foods that do not contain gluten. Gluten is a protein that is found in wheat, rye, barley, and some other grains.  Following the gluten-free diet requires some planning. It can be challenging at first, but it gets easier with time and practice. There are more  gluten-free options available today than ever before. If you need help finding gluten-free foods or if you have questions, talk with your diet and nutrition specialist (registered dietitian) or your health care provider. What do I need to know about a gluten-free diet?  All fruits, vegetables, and meats are safe to eat and do not contain gluten.  When grocery shopping, start by shopping in the produce, meat, and dairy sections. These sections are more likely to contain gluten-free foods. Then move to the aisles that contain packaged foods if you need to.  Read all food labels. Gluten is often added to foods. Always check the ingredient list and look for warnings, such as "may contain gluten."  Talk with your dietitian or health care provider before taking a gluten-free multivitamin or mineral supplement.  Be aware of gluten-free foods having contact with foods that contain gluten (cross-contamination). This can happen at home and with any processed foods. ? Talk with your health care provider or dietitian about how to reduce the risk of cross-contamination in your home. ? If you have questions about how a food is processed, ask the manufacturer. What key words help to identify gluten? Foods that list any of these key words on the label usually contain gluten:  Wheat, flour, enriched flour, bromated flour, white flour, durum flour, graham flour, phosphated flour, self-rising flour, semolina, farina, barley (malt), rye, and oats.  Starch, dextrin, modified food starch, or cereal.  Thickening, fillers, or emulsifiers.  Malt flavoring, malt extract, or malt syrup.  Hydrolyzed vegetable protein.  In the [place] packaged foods that are gluten-free are required to be labeled "GF." These foods should be easy to identify and are safe to eat. In the [place] food companies are also required to list common food allergens, including wheat, on their labels. Recommended foods Grains  Amaranth, bean flours,  100% buckwheat flour, corn, millet, nut flours or nut meals, GF oats, quinoa, rice, sorghum, teff, rice wafers, pure cornmeal tortillas, popcorn, and hot cereals made from cornmeal. Hominy, rice, wild rice. Some Asian rice noodles or bean noodles. Arrowroot starch, corn bran, corn flour, corn germ, cornmeal, corn starch, potato flour, potato starch flour, and rice bran. Plain, brown, and sweet rice flours. Rice polish, soy flour, and tapioca starch. Vegetables  All plain fresh, frozen, and canned vegetables. Fruits  All plain fresh, frozen, canned, and dried fruits, and 100% fruit juices. Meats and other protein foods  All fresh beef, pork, poultry, fish, seafood, and eggs. Fish canned in water, oil, brine, or vegetable broth. Plain nuts and seeds, peanut butter. Some lunch meat and some frankfurters. Dried beans, dried peas, and lentils. Dairy  Fresh plain, dry, evaporated, or condensed milk. Cream, butter, sour cream, whipping cream, and most yogurts. Unprocessed cheese, most processed cheeses, some cottage cheese, some cream cheeses. Beverages  Coffee, tea, most herbal teas. Carbonated beverages and some root beers. Wine, sake, and distilled spirits, such as gin, vodka, and whiskey. Most hard ciders. Fats and oils  Butter, margarine, vegetable oil, hydrogenated butter, olive oil, shortening, lard, cream, and some mayonnaise. Some commercial salad dressings. Olives. Sweets and desserts  Sugar, honey, some syrups, molasses, jelly, and jam. Plain hard candy, marshmallows, and gumdrops. Pure cocoa powder. Plain chocolate. Custard and some pudding mixes. Gelatin desserts, sorbets, frozen ice pops, and sherbet. Cake, cookies, and other desserts prepared with allowed flours. Some commercial ice creams. Cornstarch, tapioca, and rice puddings. Seasoning and other foods  Some canned or frozen soups. Monosodium glutamate (MSG). Cider, rice, and wine vinegar. Baking soda and baking powder. Cream of  tartar. Baking and nutritional yeast. Certain soy sauces made without wheat (ask your dietitian about specific brands that are allowed). Nuts, coconut, and chocolate. Salt, pepper, herbs, spices, flavoring extracts, imitation or artificial flavorings, natural flavorings, and food colorings. Some medicines and supplements. Some lip glosses and other cosmetics. Rice syrups. The items listed may not be a complete list. Talk with your dietitian about what dietary choices are best for you. Foods to avoid Grains  Barley, bran, bulgur, couscous, cracked wheat, [place], farro, graham, malt, matzo, semolina, wheat germ, and all wheat and rye cereals including spelt and kamut. Cereals containing malt as a flavoring, such as rice cereal. Noodles, spaghetti, macaroni, most packaged rice mixes, and all mixes containing wheat, rye, barley, or triticale. Vegetables  Most creamed vegetables and most vegetables canned in sauces. Some commercially prepared vegetables and salads. Fruits  Thickened or prepared fruits and some pie fillings. Some fruit snacks and fruit roll-ups. Meats and other protein foods  Any meat or meat alternative containing wheat, rye, barley, or gluten stabilizers. These are often marinated or packaged meats and lunch meats. Bread-containing products, such as Swiss steak, croquettes, meatballs, and meatloaf. Most tuna canned in vegetable broth and [place] with hydrolyzed vegetable protein (HVP) injected as part of the basting. Seitan. Imitation fish. Eggs in sauces made from ingredients to avoid. Dairy  Commercial chocolate milk drinks and malted milk. Some non-dairy creamers. Any cheese product containing ingredients to avoid. Beverages  Certain cereal beverages. Beer,  ale, malted milk, and some root beers. Some hard ciders. Some instant flavored coffees. Some herbal teas made with barley or with barley malt added. Fats and oils  Some commercial salad dressings. Sour cream containing  modified food starch. Sweets and desserts  Some toffees. Chocolate-coated nuts (may be rolled in wheat flour) and some commercial candies and candy bars. Most cakes, cookies, donuts, pastries, and other baked goods. Some commercial ice cream. Ice cream cones. Commercially prepared mixes for cakes, cookies, and other desserts. Bread pudding and other puddings thickened with flour. Products containing brown rice syrup made with barley malt enzyme. Desserts and sweets made with malt flavoring. Seasoning and other foods  Some curry powders, some dry seasoning mixes, some gravy extracts, some meat sauces, some ketchups, some prepared mustards, and horseradish. Certain soy sauces. Malt vinegar. Bouillon and bouillon cubes that contain HVP. Some chip dips, and some chewing gum. Yeast extract. Brewer's yeast. Caramel color. Some medicines and supplements. Some lip glosses and other cosmetics. The items listed may not be a complete list. Talk with your dietitian about what dietary choices are best for you. Summary  Gluten is a protein that is found in wheat, rye, barley, and some other grains. The gluten-free diet includes all foods that do not contain gluten.  If you need help finding gluten-free foods or if you have questions, talk with your diet and nutrition specialist (registered dietitian) or your health care provider.  Read all food labels. Gluten is often added to foods. Always check the ingredient list and look for warnings, such as "may contain gluten." This information is not intended to replace advice given to you by your health care provider. Make sure you discuss any questions you have with your health care provider. Document Revised: 05/16/2017 Document Reviewed: 03/18/2016 Elsevier Patient Education  [place].   Fatigue If you have fatigue, you feel tired all the time and have a lack of energy or a lack of motivation. Fatigue may make it difficult to start or complete tasks  because of exhaustion. In general, occasional or mild fatigue is often a normal response to activity or life. However, long-lasting (chronic) or extreme fatigue may be a symptom of a medical condition. Follow these instructions at home: General instructions  Watch your fatigue for any changes.  Go to bed and get up at the same time every day.  Avoid fatigue by pacing yourself during the day and getting enough sleep at night.  Maintain a healthy weight. Medicines  Take over-the-counter and prescription medicines only as told by your health care provider.  Take a multivitamin, if told by your health care provider.  Do not use herbal or dietary supplements unless they are approved by your health care provider. Activity   Exercise regularly, as told by your health care provider.  Use or practice techniques to help you relax, such as yoga, tai chi, meditation, or massage therapy. Eating and drinking   Avoid heavy meals in the evening.  Eat a well-balanced diet, which includes lean proteins, whole grains, plenty of fruits and vegetables, and low-fat dairy products.  Avoid consuming too much caffeine.  Avoid the use of alcohol.  Drink enough fluid to keep your urine pale yellow. Lifestyle  Change situations that cause you stress. Try to keep your work and personal schedule in balance.  Do not use any products that contain nicotine or tobacco, such as cigarettes and e-cigarettes. If you need help quitting, ask your health care provider.  Do  not use drugs. Contact a health care provider if:  Your fatigue does not get better.  You have a fever.  You suddenly lose or gain weight.  You have headaches.  You have trouble falling asleep or sleeping through the night.  You feel angry, guilty, anxious, or sad.  You are unable to have a bowel movement (constipation).  Your skin is dry.  You have swelling in your legs or another part of your body. Get help right away  if:  You feel confused.  Your vision is blurry.  You feel faint or you pass out.  You have a severe headache.  You have severe pain in your abdomen, your back, or the area between your waist and hips (pelvis).  You have chest pain, shortness of breath, or an irregular or fast heartbeat.  You are unable to urinate, or you urinate less than normal.  You have abnormal bleeding, such as bleeding from the rectum, vagina, nose, lungs, or nipples.  You vomit blood.  You have thoughts about hurting yourself or others. If you ever feel like you may hurt yourself or others, or have thoughts about taking your own life, get help right away. You can go to your nearest emergency department or call:  Your local emergency services (911 in the [place]  A suicide crisis helpline, such as the [facility] at (579)718-0845. This is open 24 hours a day. Summary  If you have fatigue, you feel tired all the time and have a lack of energy or a lack of motivation.  Fatigue may make it difficult to start or complete tasks because of exhaustion.  Long-lasting (chronic) or extreme fatigue may be a symptom of a medical condition.  Exercise regularly, as told by your health care provider.  Change situations that cause you stress. Try to keep your work and personal schedule in balance. This information is not intended to replace advice given to you by your health care provider. Make sure you discuss any questions you have with your health care provider. Document Revised: 12/23/2018 Document Reviewed: 02/26/2017 Elsevier Patient Education  2020 [place].    Salivary Stone  A salivary stone is a mineral deposit that builds up in the ducts that drain your salivary glands. Most salivary stones are made of calcium. When a stone forms, saliva can back up into the gland and cause painful swelling. Your salivary glands are the glands that produce saliva. You have six major  salivary glands. Each gland has a duct that carries saliva into your mouth. Saliva keeps your mouth moist and breaks down the food that you eat. It also helps prevent tooth decay. Two salivary glands are located just in front of your ears (parotid). The ducts for these glands open up inside your cheeks, near your back teeth. You also have two glands under your tongue (sublingual) and two glands under your jaw (submandibular). The ducts for these glands open under your tongue. A stone can form in any salivary gland. The most common place for a salivary stone to develop is in a submandibular salivary gland. What are the causes? Salivary stones may be caused by any condition that reduces the flow of saliva. It is not known why some people form stones. What increases the risk? You are more likely to develop this condition if:  You are female.  You do not drink enough water.  You smoke.  You have any of the following: ? High blood pressure. ? Gout. ?  Diabetes. What are the signs or symptoms? The main sign of a salivary gland stone is sudden swelling of a salivary gland when eating. This usually happens under the jaw on one side. Other signs and symptoms may include:  Swelling of the cheek or under the tongue when eating.  Pain in the swollen area.  Trouble chewing or swallowing.  Swelling that goes down after eating. How is this diagnosed? This condition may be diagnosed based on:  Your signs and symptoms.  A physical exam. In many cases, your health care provider will be able to feel the stone in a duct inside your mouth.  Imaging studies, such as: ? X-rays. ? Ultrasound. ? CT scan. ? MRI. You may need to see an ear, nose, and throat specialist (ENT or otolaryngologist) for diagnosis and treatment. How is this treated? Treatment for this condition depends on the size of the stone.  A small stone that is not causing symptoms may be treated with home care.  For a stone that is  large enough to cause symptoms, the treatment options may include: ? Probing and widening of the duct to allow the stone to pass. ? Inserting a thin, flexible scope (endoscope) into the duct to locate and remove the stone. ? Breaking up the stone with sound waves. ? Removing the entire salivary gland. Follow these instructions at home:  To relieve discomfort  Follow these instructions every few hours: ? Suck on a lemon candy to stimulate the flow of saliva. ? Put a warm compress over the gland. ? Gently massage the gland. General instructions  Drink enough fluid to keep your urine pale yellow.  Do not use any products that contain nicotine or tobacco, such as cigarettes and e-cigarettes. If you need help quitting, ask your health care provider.  Keep all follow-up visits as told by your health care provider. This is important. Contact a health care provider if:  You have pain and swelling in your face, jaw, or mouth after eating.  You have persistent swelling in any of these places: ? In front of your ear. ? Under your jaw. ? Inside your mouth. Get help right away if:  You have pain and swelling in your face, jaw, or mouth, and this is getting worse.  Your pain and swelling make it hard to swallow or breathe. Summary  A salivary stone is a mineral deposit that builds up in the ducts that drain your salivary glands.  When a stone forms, saliva can back up into the gland and cause painful swelling.  Salivary stones may be caused by any condition that reduces the flow of saliva.  Treatment for this condition depends on the size of the stone. This information is not intended to replace advice given to you by your health care provider. Make sure you discuss any questions you have with your health care provider. Document Revised: 07/14/2017 Document Reviewed: 07/14/2017 Elsevier Patient Education  2020 [employer].

## 2019-09-02 ENCOUNTER — Ambulatory Visit (INDEPENDENT_AMBULATORY_CARE_PROVIDER_SITE_OTHER): Payer: 59 | Admitting: Cardiology

## 2019-09-02 ENCOUNTER — Ambulatory Visit (INDEPENDENT_AMBULATORY_CARE_PROVIDER_SITE_OTHER): Payer: 59

## 2019-09-02 ENCOUNTER — Encounter: Payer: Self-pay | Admitting: Cardiology

## 2019-09-02 VITALS — BP 140/88 | HR 95 | Ht 68.0 in | Wt 286.0 lb

## 2019-09-02 DIAGNOSIS — I10 Essential (primary) hypertension: Secondary | ICD-10-CM

## 2019-09-02 DIAGNOSIS — R002 Palpitations: Secondary | ICD-10-CM | POA: Insufficient documentation

## 2019-09-02 DIAGNOSIS — Z8616 Personal history of COVID-19: Secondary | ICD-10-CM

## 2019-09-02 DIAGNOSIS — R011 Cardiac murmur, unspecified: Secondary | ICD-10-CM | POA: Insufficient documentation

## 2019-09-02 LAB — COMPREHENSIVE METABOLIC PANEL
ALT: 19 IU/L (ref 0–32)
AST: 15 IU/L (ref 0–40)
Albumin/Globulin Ratio: 1.4 (ref 1.2–2.2)
Albumin: 4.2 g/dL (ref 3.9–5.0)
Alkaline Phosphatase: 85 IU/L (ref 39–117)
BUN/Creatinine Ratio: 16 (ref 9–23)
BUN: 13 mg/dL (ref 6–20)
Bilirubin Total: 0.3 mg/dL (ref 0.0–1.2)
CO2: 28 mmol/L (ref 20–29)
Calcium: 9.4 mg/dL (ref 8.7–10.2)
Chloride: 100 mmol/L (ref 96–106)
Creatinine, Ser: 0.8 mg/dL (ref 0.57–1.00)
GFR calc Af Amer: 116 mL/min/{1.73_m2} (ref >59)
GFR calc non Af Amer: 101 mL/min/{1.73_m2} (ref >59)
Globulin, Total: 3 g/dL (ref 1.5–4.5)
Glucose: 95 mg/dL (ref 65–99)
Potassium: 3.6 mmol/L (ref 3.5–5.2)
Sodium: 141 mmol/L (ref 134–144)
Total Protein: 7.2 g/dL (ref 6.0–8.5)

## 2019-09-02 LAB — SAR COV2 SEROLOGY (COVID19)AB(IGG),IA: DiaSorin SARS-CoV-2 Ab, IgG: POSITIVE

## 2019-09-02 LAB — CBC
Hematocrit: 40.2 % (ref 34.0–46.6)
Hemoglobin: 13.5 g/dL (ref 11.1–15.9)
MCH: 28 pg (ref 26.6–33.0)
MCHC: 33.6 g/dL (ref 31.5–35.7)
MCV: 83 fL (ref 79–97)
Platelets: 356 10*3/uL (ref 150–450)
RBC: 4.82 x10E6/uL (ref 3.77–5.28)
RDW: 13.6 % (ref 11.7–15.4)
WBC: 10.9 10*3/uL — ABNORMAL HIGH (ref 3.4–10.8)

## 2019-09-02 LAB — THYROID PANEL WITH TSH
Free Thyroxine Index: 2 (ref 1.2–4.9)
T3 Uptake Ratio: 25 % (ref 24–39)
T4, Total: 7.9 ug/dL (ref 4.5–12.0)
TSH: 3.86 u[IU]/mL (ref 0.450–4.500)

## 2019-09-02 LAB — VITAMIN D 25 HYDROXY (VIT D DEFICIENCY, FRACTURES): Vit D, 25-Hydroxy: 41 ng/mL (ref 30.0–100.0)

## 2019-09-02 NOTE — Progress Notes (Signed)
Cardiology Office Note:    Date:  09/02/2019   ID:  Nicole Mendoza, DOB 1990-11-14, MRN [id number]  PCP:  Rubie Maid, MD  Cardiologist:  Jenean Lindau, MD   Referring MD: Fenton Foy, NP    ASSESSMENT:    1. Essential hypertension   2. Morbid obesity due to excess calories (HCC)   3. Palpitations   4. Cardiac murmur   5. History of 2019 novel coronavirus disease (COVID-19)    PLAN:    In order of problems listed above:  1. Primary prevention stressed with the patient.  Importance of compliance with diet and medication stressed and he vocalized understanding. 2. Morbid obesity: Diet was discussed.  Importance of ambulation and exercise stressed weight reduction stressed.  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 3. Palpitations: I discussed my findings with the patient at length.  TSH is unremarkable.  We will do a 2-week ZIO monitoring to understand the symptoms.  Fortunately she denies any significant issues such as dizziness or syncope. 4. Cardiac murmur: Echocardiogram will be done to assess murmur heard on auscultation.  She knows to go to the nearest emergency room for any concerning symptoms. 5. Patient will be seen in follow-up appointment in 3 months or earlier if the patient has any concerns    Medication Adjustments/Labs and Tests Ordered: Current medicines are reviewed at length with the patient today.  Concerns regarding medicines are outlined above.  No orders of the defined types were placed in this encounter.  No orders of the defined types were placed in this encounter.    History of Present Illness:    Nicole Mendoza is a 29 y.o. female who is being seen today for the evaluation of fatigue at the request of Fenton Foy, NP.  Patient is a pleasant 29 year old female.  She is morbidly obese.  She has history of essential hypertension.  She mentions to me  that she is having some fatigue especially on exertion.  She tells me that she is deconditioned and has not exercise much.  Several months ago she had the COVID-19 infection and was treated for it.  Recently she went to the Covid clinic and I reviewed the lab work.  No orthopnea or PND.  No chest pain.  No dizziness or syncope.  At the time of my evaluation, the patient is alert awake oriented and in no distress.  Past Medical History:  Diagnosis Date  . Allergic rhinitis   . Angio-edema   . Asthma   . Fatty liver   . GERD (gastroesophageal reflux disease)   . Hypertension   . Recurrent upper respiratory infection (URI)   . Tachycardia     History reviewed. No pertinent surgical history.  Current Medications: Current Meds  Medication Sig  . Ascorbic Acid (SUPER C COMPLEX PO)   . cefdinir (OMNICEF) 300 MG capsule Take 1 capsule (300 mg total) by mouth 2 (two) times daily for 10 days.  . cetirizine (ZYRTEC) 10 MG tablet Take by mouth.  . EPINEPHrine (AUVI-Q) 0.3 mg/0.3 mL IJ SOAJ injection Use as directed for severe allergic reactions  . famotidine (PEPCID) 20 MG tablet TAKE 1 TABLET BY MOUTH TWICE A DAY  . ferrous sulfate 325 (65 FE) MG tablet 3 (three) times a week.   . Fluticasone Propionate (XHANCE) 93 MCG/ACT EXHU Place 2 sprays into both nostrils 2 (two) times  daily.  . hydrochlorothiazide (HYDRODIURIL) 25 MG tablet Take 25 mg by mouth.  . levalbuterol (XOPENEX HFA) 45 MCG/ACT inhaler Inhale 2 puffs into the lungs every 6 (six) hours as needed for wheezing.  . levalbuterol (XOPENEX) 1.25 MG/3ML nebulizer solution USE 1 VIAL BY NEBULIZATION EVERY 6 (SIX) HOURS AS NEEDED FOR WHEEZING.  . magic mouthwash SOLN Take 5 mLs by mouth 4 (four) times daily for 7 days.  . metoprolol succinate (TOPROL-XL) 100 MG 24 hr tablet Take 100 mg by mouth daily. Take with or immediately following a meal.  . mometasone (ASMANEX, 30 METERED DOSES,) 220 MCG/INH inhaler Inhale 2 puffs into the lungs 2 (two)  times daily.  . montelukast (SINGULAIR) 10 MG tablet Take 1 tablet (10 mg total) by mouth daily.  . norethindrone (ORTHO MICRONOR) 0.35 MG tablet Take 0.35 mg by mouth.  . ondansetron (ZOFRAN) 4 MG tablet Take 4 mg by mouth every 8 (eight) hours as needed for nausea or vomiting.  . pantoprazole (PROTONIX) 40 MG tablet Take 40 mg by mouth daily.  . potassium chloride SA (K-DUR) 20 MEQ tablet Take 1 tablet (20 mEq total) by mouth 2 (two) times daily for 5 days.  . Probiotic Product ([place]) CAPS   . sucralfate (CARAFATE) 1 g tablet TAKE 1 TABLET (1 G TOTAL) BY MOUTH 4 (FOUR) TIMES DAILY - WITH MEALS AND AT BEDTIME.  Marland Kitchen VITAMIN D, CHOLECALCIFEROL, PO Take by mouth.     Allergies:   Budesonide-formoterol fumarate, Penicillins, Albuterol, and Losartan   Social History   Socioeconomic History  . Marital status: Married    Spouse name: Not on file  . Number of children: 0  . Years of education: Not on file  . Highest education level: Not on file  Occupational History  . Not on file  Tobacco Use  . Smoking status: Never Smoker  . Smokeless tobacco: Never Used  Substance and Sexual Activity  . Alcohol use: No    Alcohol/week: 0.0 standard drinks  . Drug use: No  . Sexual activity: Yes    Birth control/protection: Pill  Other Topics Concern  . Not on file  Social History Narrative  . Not on file   Social Determinants of Health   Financial Resource Strain:   . Difficulty of Paying Living Expenses:   Food Insecurity:   . Worried About Charity fundraiser in the Last Year:   . Arboriculturist in the Last Year:   Transportation Needs:   . Film/video editor (Medical):   Marland Kitchen Lack of Transportation (Non-Medical):   Physical Activity:   . Days of Exercise per Week:   . Minutes of Exercise per Session:   Stress:   . Feeling of Stress :   Social Connections:   . Frequency of Communication with Friends and Family:   . Frequency of Social Gatherings with Friends and  Family:   . Attends Religious Services:   . Active Member of Clubs or Organizations:   . Attends Archivist Meetings:   Marland Kitchen Marital Status:      Family History: The patient's family history includes Allergic rhinitis in her father and sister; Asthma in her father and sister. There is no history of Angioedema, Atopy, Eczema, Immunodeficiency, or Urticaria.  ROS:   Please see the history of present illness.    All other systems reviewed and are negative.  EKGs/Labs/Other Studies Reviewed:    The following studies were reviewed today: EKG reveals sinus  rhythm and nonspecific ST changes   Recent Labs: 09/01/2019: ALT 19; BUN 13; Creatinine, Ser 0.80; Hemoglobin 13.5; Platelets 356; Potassium 3.6; Sodium 141; TSH 3.860  Recent Lipid Panel No results found for: CHOL, TRIG, HDL, CHOLHDL, VLDL, LDLCALC, LDLDIRECT  Physical Exam:    VS:  BP 140/88   Pulse 95   Ht 5\' 8"  (1.727 m)   Wt 286 lb (129.7 kg)   LMP 08/27/2019 (Exact Date)   SpO2 99%   BMI 43.49 kg/m     Wt Readings from Last 3 Encounters:  09/02/19 286 lb (129.7 kg)  09/01/19 287 lb 8 oz (130.4 kg)  08/10/19 281 lb 9.6 oz (127.7 kg)     GEN: Patient is in no acute distress HEENT: Normal NECK: No JVD; No carotid bruits LYMPHATICS: No lymphadenopathy CARDIAC: S1 S2 regular, 2/6 systolic murmur at the apex. RESPIRATORY:  Clear to auscultation without rales, wheezing or rhonchi  ABDOMEN: Soft, non-tender, non-distended MUSCULOSKELETAL:  No edema; No deformity  SKIN: Warm and dry NEUROLOGIC:  Alert and oriented x 3 PSYCHIATRIC:  Normal affect    Signed, Jenean Lindau, MD  09/02/2019 10:42 AM    [place]

## 2019-09-02 NOTE — Patient Instructions (Signed)
Medication Instructions:  No medication changes *If you need a refill on your cardiac medications before your next appointment, please call your pharmacy*   Lab Work: None ordered If you have labs (blood work) drawn today and your tests are completely normal, you will receive your results only by: Marland Kitchen MyChart Message (if you have MyChart) OR . A paper copy in the mail If you have any lab test that is abnormal or we need to change your treatment, we will call you to review the results.   Testing/Procedures: Your physician has requested that you have an echocardiogram. Echocardiography is a painless test that uses sound waves to create images of your heart. It provides your doctor with information about the size and shape of your heart and how well your heart's chambers and valves are working. This procedure takes approximately one hour. There are no restrictions for this procedure.   WHY IS MY DOCTOR PRESCRIBING ZIO? The Zio system is proven and trusted by physicians to detect and diagnose irregular heart rhythms -- and has been prescribed to hundreds of thousands of patients.  The FDA has cleared the Zio system to monitor for many different kinds of irregular heart rhythms. In a study, physicians were able to reach a diagnosis 90% of the time with the Zio system1.  You can wear the Zio monitor -- a small, discreet, comfortable patch -- during your normal day-to-day activity, including while you sleep, shower, and exercise, while it records every single heartbeat for analysis.  1Barrett, P., et al. Comparison of 24 Hour Holter Monitoring Versus 14 Day Novel Adhesive Patch Electrocardiographic Monitoring. [place], 2014.  ZIO VS. HOLTER MONITORING The Zio monitor can be comfortably worn for up to 14 days. Holter monitors can be worn for 24 to 48 hours, limiting the time to record any irregular heart rhythms you may have. Zio is able to capture data for the 51% of patients  who have their first symptom-triggered arrhythmia after 48 hours.1  LIVE WITHOUT RESTRICTIONS The Zio ambulatory cardiac monitor is a small, unobtrusive, and water-resistant patch--you might even forget you're wearing it. The Zio monitor records and stores every beat of your heart, whether you're sleeping, working out, or showering.  Wear the monitor for 2 weeks. Remove 09/16/2019.   Follow-Up: At [facility], you and your health needs are our priority.  As part of our continuing mission to provide you with exceptional heart care, we have created designated Provider Care Teams.  These Care Teams include your primary Cardiologist (physician) and Advanced Practice Providers (APPs -  Physician Assistants and Nurse Practitioners) who all work together to provide you with the care you need, when you need it.  We recommend signing up for the patient portal called "MyChart".  Sign up information is provided on this After Visit Summary.  MyChart is used to connect with patients for Virtual Visits (Telemedicine).  Patients are able to view lab/test results, encounter notes, upcoming appointments, etc.  Non-urgent messages can be sent to your provider as well.   To learn more about what you can do with MyChart, go to [email or web address].    Your next appointment:   3 month(s)  The format for your next appointment:   In Person  Provider:   Jyl Heinz, MD   Other Instructions NA

## 2019-09-03 NOTE — Progress Notes (Signed)
Contacted patient regarding results. Patient verbally understood had no additional questions.

## 2019-09-08 ENCOUNTER — Other Ambulatory Visit (HOSPITAL_BASED_OUTPATIENT_CLINIC_OR_DEPARTMENT_OTHER): Payer: 59

## 2019-09-09 ENCOUNTER — Ambulatory Visit (HOSPITAL_BASED_OUTPATIENT_CLINIC_OR_DEPARTMENT_OTHER)
Admission: RE | Admit: 2019-09-09 | Discharge: 2019-09-09 | Disposition: A | Discharge status: Home / Self Care | Payer: 59 | Source: Ambulatory Visit | Attending: Cardiology | Admitting: Cardiology

## 2019-09-09 ENCOUNTER — Other Ambulatory Visit: Payer: Self-pay

## 2019-09-09 DIAGNOSIS — R011 Cardiac murmur, unspecified: Secondary | ICD-10-CM | POA: Diagnosis present

## 2019-09-09 NOTE — Progress Notes (Signed)
  Echocardiogram 2D Echocardiogram has been performed.  Nicole Mendoza 09/09/2019, 9:00 AM

## 2019-09-28 ENCOUNTER — Telehealth: Payer: Self-pay | Admitting: Family Medicine

## 2019-09-28 NOTE — Telephone Encounter (Signed)
Noted  

## 2019-09-28 NOTE — Telephone Encounter (Signed)
..   Pt understands that although there may be some limitations with this type of visit, we will take all precautions to reduce any security or privacy concerns.  Pt understands that this will be treated like an in office visit and we will file with pt's insurance, and there may be a patient responsible charge related to this service. ? ?

## 2019-09-29 ENCOUNTER — Ambulatory Visit: Payer: 59

## 2019-10-05 ENCOUNTER — Other Ambulatory Visit: Payer: Self-pay

## 2019-10-05 ENCOUNTER — Other Ambulatory Visit: Payer: Self-pay | Admitting: Nurse Practitioner

## 2019-10-05 ENCOUNTER — Ambulatory Visit (INDEPENDENT_AMBULATORY_CARE_PROVIDER_SITE_OTHER): Payer: 59 | Admitting: Nurse Practitioner

## 2019-10-05 VITALS — BP 118/78 | HR 99 | Temp 97.7°F | Ht 68.0 in | Wt 288.5 lb

## 2019-10-05 DIAGNOSIS — R52 Pain, unspecified: Secondary | ICD-10-CM | POA: Diagnosis not present

## 2019-10-05 DIAGNOSIS — R5383 Other fatigue: Secondary | ICD-10-CM | POA: Diagnosis not present

## 2019-10-05 DIAGNOSIS — M791 Myalgia, unspecified site: Secondary | ICD-10-CM

## 2019-10-05 DIAGNOSIS — Z8616 Personal history of COVID-19: Secondary | ICD-10-CM | POA: Diagnosis not present

## 2019-10-05 DIAGNOSIS — R5381 Other malaise: Secondary | ICD-10-CM | POA: Diagnosis not present

## 2019-10-05 NOTE — Assessment & Plan Note (Signed)
Fatigue Muscle pain:  Will order labs - concerned for autoimmune disorder   Stay well hydrated  Will order PT  Continue gluten free diet - work on healthy weight  Continue CPAP at night  Heart Murmur Heart palpitations:  Please keep follow up with cardiology  Brain Fog:  Please discuss with neurology at upcoming appointment  Follow up:  Follow up in 1 month or sooner if needed

## 2019-10-05 NOTE — Patient Instructions (Signed)
Fatigue Muscle pain:  Will order labs - concerned for autoimmune disorder   Stay well hydrated  Will order PT  Continue gluten free diet - work on healthy weight  Continue CPAP at night  Heart Murmur Heart palpitations:  Please keep follow up with cardiology  Brain Fog:  Please discuss with neurology at upcoming appointment  Follow up:  Follow up in 1 month or sooner if needed

## 2019-10-05 NOTE — Progress Notes (Signed)
@Patient  ID: Nicole Mendoza, female    DOB: 08-26-1990, 29 y.o.   MRN: [id number]  Chief Complaint  Patient presents with  . 1 month follow up    Patient states she is feeling much better. Still has some spells of fatigue and body aches but do not last as long as before.    Referring provider: Rubie Maid, MD   29 year old female with hypertension, GERD asthma, sleep apnea (compliant with CPAP) and history of Covid (June 2020).  Recent significant encounters:  09/01/19 [facility] initial visit: refer to cardiology: intermittent tachycardia, fatigue, history of covid, May trial gluten free diet - may help with reducing systemic inflammation that could possibly still be lingering from [place], Walked in office today: Sats remained in high 90's and heart rate went up to 113 BPM.   09/02/19 Cardiology: Ordered 2 week Zio and echo, encouraged weight loss  HPI  Patient presents today for post Covid care clinic follow-up.  She was last seen here on 09/01/2019.  She states that she does feel some improved since last visit.  She states that she does still have bad days.  She states that yesterday was a bad day for her.  She had to miss work yesterday.  She states that she was very fatigued and had muscle pain.  She admits that she has not been drinking enough water.  She does state that this is associated with feeling bad yesterday.  Patient has been seen by cardiology since last visit.  Her echo overall looked good.  She was ordered a Zio patch to wear for 2 weeks.  She has not received the results from this yet.  She has an upcoming follow-up visit with neurology.  She sees them for sleep and is compliant with her CPAP.  Patient is having ongoing memory loss and brain fog, but has not discussed this with neurology yet.  She states that she will discuss this at her upcoming visit.  Patient states that she is not exercising she lives a sedentary lifestyle.  She is trying to eat  healthier.Denies f/c/s, n/v/d, hemoptysis, PND, chest pain or edema.     Allergies  Allergen Reactions  . Budesonide-Formoterol Fumarate Other (See Comments)    Causes Hypertension  . Penicillins Hives  . Albuterol Palpitations  . Losartan Rash    Immunization History  Administered Date(s) Administered  . Pneumococcal Polysaccharide-23 05/12/2018    Past Medical History:  Diagnosis Date  . Allergic rhinitis   . Angio-edema   . Asthma   . Fatty liver   . GERD (gastroesophageal reflux disease)   . Hypertension   . Recurrent upper respiratory infection (URI)   . Tachycardia     Tobacco History: Social History   Tobacco Use  Smoking Status Never Smoker  Smokeless Tobacco Never Used   Counseling given: Not Answered   Outpatient Encounter Medications as of 10/05/2019  Medication Sig  . Ascorbic Acid (SUPER C COMPLEX PO)   . cetirizine (ZYRTEC) 10 MG tablet Take by mouth.  . EPINEPHrine (AUVI-Q) 0.3 mg/0.3 mL IJ SOAJ injection Use as directed for severe allergic reactions  . famotidine (PEPCID) 20 MG tablet TAKE 1 TABLET BY MOUTH TWICE A DAY  . ferrous sulfate 325 (65 FE) MG tablet 3 (three) times a week.   . Fluticasone Propionate (XHANCE) 93 MCG/ACT EXHU Place 2 sprays into both nostrils 2 (two) times daily.  . hydrochlorothiazide (HYDRODIURIL) 25 MG tablet Take 25 mg by mouth.  Marland Kitchen  levalbuterol (XOPENEX HFA) 45 MCG/ACT inhaler Inhale 2 puffs into the lungs every 6 (six) hours as needed for wheezing.  . levalbuterol (XOPENEX) 1.25 MG/3ML nebulizer solution USE 1 VIAL BY NEBULIZATION EVERY 6 (SIX) HOURS AS NEEDED FOR WHEEZING.  . metoprolol succinate (TOPROL-XL) 100 MG 24 hr tablet Take 100 mg by mouth daily. Take with or immediately following a meal.  . mometasone (ASMANEX, 30 METERED DOSES,) 220 MCG/INH inhaler Inhale 2 puffs into the lungs 2 (two) times daily.  . montelukast (SINGULAIR) 10 MG tablet Take 1 tablet (10 mg total) by mouth daily.  . norethindrone (ORTHO  MICRONOR) 0.35 MG tablet Take 0.35 mg by mouth.  . ondansetron (ZOFRAN) 4 MG tablet Take 4 mg by mouth every 8 (eight) hours as needed for nausea or vomiting.  . pantoprazole (PROTONIX) 40 MG tablet Take 40 mg by mouth daily.  . potassium chloride SA (K-DUR) 20 MEQ tablet Take 1 tablet (20 mEq total) by mouth 2 (two) times daily for 5 days.  . Probiotic Product ([place]) CAPS   . VITAMIN D, CHOLECALCIFEROL, PO Take by mouth.  . sucralfate (CARAFATE) 1 g tablet TAKE 1 TABLET (1 G TOTAL) BY MOUTH 4 (FOUR) TIMES DAILY - WITH MEALS AND AT BEDTIME.   No facility-administered encounter medications on file as of 10/05/2019.     Review of Systems  Review of Systems  Constitutional: Positive for activity change (decreased) and fatigue.  Respiratory: Negative for cough, shortness of breath and wheezing.   Cardiovascular: Positive for palpitations. Negative for chest pain and leg swelling.  Musculoskeletal: Positive for arthralgias and myalgias. Negative for joint swelling.  Psychiatric/Behavioral: Positive for decreased concentration.       Physical Exam  BP 118/78 (BP Location: Left Arm, Patient Position: Sitting, Cuff Size: Large)   Pulse 99   Temp 97.7 F (36.5 C)   Ht 5\' 8"  (1.727 m)   Wt 288 lb 8 oz (130.9 kg)   LMP 09/19/2019   SpO2 99%   BMI 43.87 kg/m   Wt Readings from Last 5 Encounters:  10/05/19 288 lb 8 oz (130.9 kg)  09/02/19 286 lb (129.7 kg)  09/01/19 287 lb 8 oz (130.4 kg)  08/10/19 281 lb 9.6 oz (127.7 kg)  05/27/19 299 lb 3.2 oz (135.7 kg)     Physical Exam Vitals and nursing note reviewed.  Constitutional:      General: She is not in acute distress.    Appearance: She is well-developed.  Cardiovascular:     Rate and Rhythm: Normal rate and regular rhythm.  Pulmonary:     Effort: Pulmonary effort is normal. No respiratory distress.     Breath sounds: Normal breath sounds. No wheezing or rhonchi.  Neurological:     Mental Status: She is  alert and oriented to person, place, and time.  Psychiatric:        Mood and Affect: Mood normal.        Behavior: Behavior normal.       Imaging: ECHOCARDIOGRAM COMPLETE  Result Date: 09/09/2019    ECHOCARDIOGRAM REPORT   Patient Name:   Nicole Mendoza Date of Exam: 09/09/2019 Medical Rec #:  [id number]   Height:       68.0 in Accession #:    [id number]  Weight:       286.0 lb Date of Birth:  08-Oct-1990  BSA:          2.379 m Patient Age:    70 years  BP:           148/88 mmHg Patient Gender: F           HR:           104 bpm. Exam Location:  [place] Procedure: 2D Echo, Cardiac Doppler and Color Doppler Indications:    Murmur  History:        Patient has no prior history of Echocardiogram examinations.                 Arrythmias:Palpitations, Signs/Symptoms:Murmur; Risk                 Factors:Hypertension.  Sonographer:    Cardell Peach RDCS (AE) Referring Phys: [place]  1. Left ventricular ejection fraction, by estimation, is 60 to 65%. The left ventricle has normal function. The left ventricle has no regional wall motion abnormalities. Left ventricular diastolic parameters were normal.  2. Right ventricular systolic function is normal. The right ventricular size is normal.  3. The mitral valve is normal in structure. Trivial mitral valve regurgitation. No evidence of mitral stenosis.  4. The aortic valve was not well visualized. Aortic valve regurgitation is not visualized. No aortic stenosis is present.  5. The inferior vena cava is normal in size with greater than 50% respiratory variability, suggesting right atrial pressure of 3 mmHg. FINDINGS  Left Ventricle: Left ventricular ejection fraction, by estimation, is 60 to 65%. The left ventricle has normal function. The left ventricle has no regional wall motion abnormalities. The left ventricular internal cavity size was normal in size. There is  no left ventricular hypertrophy. Left ventricular diastolic parameters were  normal. Right Ventricle: The right ventricular size is normal. No increase in right ventricular wall thickness. Right ventricular systolic function is normal. Left Atrium: Left atrial size was normal in size. Right Atrium: Right atrial size was normal in size. Pericardium: There is no evidence of pericardial effusion. Mitral Valve: The mitral valve is normal in structure. Normal mobility of the mitral valve leaflets. Trivial mitral valve regurgitation. No evidence of mitral valve stenosis. Tricuspid Valve: The tricuspid valve is normal in structure. Tricuspid valve regurgitation is not demonstrated. No evidence of tricuspid stenosis. Aortic Valve: The aortic valve was not well visualized. Aortic valve regurgitation is not visualized. No aortic stenosis is present. Aortic valve peak gradient measures 9.2 mmHg. Pulmonic Valve: The pulmonic valve was normal in structure. Pulmonic valve regurgitation is not visualized. No evidence of pulmonic stenosis. Aorta: The aortic root is normal in size and structure. Venous: The inferior vena cava is normal in size with greater than 50% respiratory variability, suggesting right atrial pressure of 3 mmHg. IAS/Shunts: No atrial level shunt detected by color flow Doppler.  LEFT VENTRICLE PLAX 2D LVIDd:         4.46 cm  Diastology LVIDs:         2.56 cm  LV e' lateral:   16.30 cm/s LV PW:         0.92 cm  LV E/e' lateral: 7.4 LV IVS:        0.93 cm  LV e' medial:    15.90 cm/s LVOT diam:     1.80 cm  LV E/e' medial:  7.6 LV SV:         42 LV SV Index:   18 LVOT Area:     2.54 cm  RIGHT VENTRICLE             IVC RV Basal  diam:  3.56 cm     IVC diam: 1.68 cm RV S prime:     15.20 cm/s TAPSE (M-mode): 2.7 cm LEFT ATRIUM             Index       RIGHT ATRIUM           Index LA diam:        3.20 cm 1.34 cm/m  RA Area:     16.60 cm LA Vol (A2C):   33.7 ml 14.16 ml/m RA Volume:   45.00 ml  18.91 ml/m LA Vol (A4C):   46.0 ml 19.33 ml/m LA Biplane Vol: 44.1 ml 18.54 ml/m  AORTIC VALVE  AV Area (Vmax): 1.69 cm AV Vmax:        152.00 cm/s AV Peak Grad:   9.2 mmHg LVOT Vmax:      101.00 cm/s LVOT Vmean:     65.200 cm/s LVOT VTI:       0.166 m  AORTA Ao Root diam: 2.30 cm Ao Asc diam:  2.50 cm MITRAL VALVE MV Area (PHT): 4.71 cm     SHUNTS MV Decel Time: 161 msec     Systemic VTI:  0.17 m MV E velocity: 121.00 cm/s  Systemic Diam: 1.80 cm MV A velocity: 34.90 cm/s MV E/A ratio:  3.47 Jenne Campus MD Electronically signed by Jenne Campus MD Signature Date/Time: 09/09/2019/12:35:38 PM    Final    LONG TERM MONITOR (3-14 DAYS)  Result Date: 09/30/2019 Sierra Hinz, DOB 1991/02/09, MRN [id number] EVENT MONITOR REPORT: Patient was monitored from 09/02/2019 to 09/16/2019. Indication:                    Palpitations Ordering physician:  Jenean Lindau, MD Referring physician:  Jenean Lindau, MD Baseline rhythm: Sinus Minimum heart rate: 56 BPM.  Average heart rate: 93 BPM.  Maximal heart rate 180 BPM. Atrial arrhythmia: Sinus rhythm with no significant arrhythmias Ventricular arrhythmia: None significant Conduction abnormality: None significant Symptoms: None significant Conclusion: Normal event monitor Interpreting  cardiologist: Jenean Lindau, MD Date: 09/30/2019 9:38 AM    Assessment & Plan:   History of COVID-19 Fatigue Muscle pain:  Will order labs - concerned for autoimmune disorder   Stay well hydrated  Will order PT  Continue gluten free diet - work on healthy weight  Continue CPAP at night  Heart Murmur Heart palpitations:  Please keep follow up with cardiology  Brain Fog:  Please discuss with neurology at upcoming appointment  Follow up:  Follow up in 1 month or sooner if needed       Fenton Foy, NP 10/05/2019

## 2019-10-07 ENCOUNTER — Encounter: Payer: Self-pay | Admitting: Family Medicine

## 2019-10-07 ENCOUNTER — Telehealth (INDEPENDENT_AMBULATORY_CARE_PROVIDER_SITE_OTHER): Payer: 59 | Admitting: Family Medicine

## 2019-10-07 ENCOUNTER — Other Ambulatory Visit: Payer: Self-pay | Admitting: Nurse Practitioner

## 2019-10-07 DIAGNOSIS — Z9989 Dependence on other enabling machines and devices: Secondary | ICD-10-CM

## 2019-10-07 DIAGNOSIS — R768 Other specified abnormal immunological findings in serum: Secondary | ICD-10-CM

## 2019-10-07 DIAGNOSIS — G4733 Obstructive sleep apnea (adult) (pediatric): Secondary | ICD-10-CM

## 2019-10-07 LAB — CBC WITH DIFFERENTIAL/PLATELET
Basophils Absolute: 0.1 10*3/uL (ref 0.0–0.2)
Basos: 1 %
EOS (ABSOLUTE): 0.1 10*3/uL (ref 0.0–0.4)
Eos: 1 %
Hematocrit: 38 % (ref 34.0–46.6)
Hemoglobin: 13.3 g/dL (ref 11.1–15.9)
Immature Grans (Abs): 0.1 10*3/uL (ref 0.0–0.1)
Immature Granulocytes: 1 %
Lymphocytes Absolute: 1.9 10*3/uL (ref 0.7–3.1)
Lymphs: 18 %
MCH: 28.2 pg (ref 26.6–33.0)
MCHC: 35 g/dL (ref 31.5–35.7)
MCV: 81 fL (ref 79–97)
Monocytes Absolute: 0.6 10*3/uL (ref 0.1–0.9)
Monocytes: 6 %
Neutrophils Absolute: 7.9 10*3/uL — ABNORMAL HIGH (ref 1.4–7.0)
Neutrophils: 73 %
Platelets: 360 10*3/uL (ref 150–450)
RBC: 4.72 x10E6/uL (ref 3.77–5.28)
RDW: 12.9 % (ref 11.7–15.4)
WBC: 10.7 10*3/uL (ref 3.4–10.8)

## 2019-10-07 LAB — COMPREHENSIVE METABOLIC PANEL
ALT: 21 IU/L (ref 0–32)
AST: 13 IU/L (ref 0–40)
Albumin/Globulin Ratio: 1.5 (ref 1.2–2.2)
Albumin: 4.3 g/dL (ref 3.9–5.0)
Alkaline Phosphatase: 84 IU/L (ref 39–117)
BUN/Creatinine Ratio: 18 (ref 9–23)
BUN: 13 mg/dL (ref 6–20)
Bilirubin Total: 0.3 mg/dL (ref 0.0–1.2)
CO2: 24 mmol/L (ref 20–29)
Calcium: 9.4 mg/dL (ref 8.7–10.2)
Chloride: 99 mmol/L (ref 96–106)
Creatinine, Ser: 0.74 mg/dL (ref 0.57–1.00)
GFR calc Af Amer: 127 mL/min/{1.73_m2} (ref >59)
GFR calc non Af Amer: 111 mL/min/{1.73_m2} (ref >59)
Globulin, Total: 2.8 g/dL (ref 1.5–4.5)
Glucose: 126 mg/dL — ABNORMAL HIGH (ref 65–99)
Potassium: 3.8 mmol/L (ref 3.5–5.2)
Sodium: 139 mmol/L (ref 134–144)
Total Protein: 7.1 g/dL (ref 6.0–8.5)

## 2019-10-07 LAB — FANA STAINING PATTERNS: Speckled Pattern: 1:80 {titer}

## 2019-10-07 LAB — ANA,IFA RA DIAG PNL W/RFLX TIT/PATN
ANA Titer 1: POSITIVE — AB
Cyclic Citrullin Peptide Ab: 1 units (ref 0–19)
Rheumatoid fact SerPl-aCnc: <10 IU/mL (ref 0.0–13.9)

## 2019-10-07 LAB — SEDIMENTATION RATE: Sed Rate: 11 mm/hr (ref 0–32)

## 2019-10-07 NOTE — Progress Notes (Signed)
Patient notified of results, verbally understood and agreed with the referral to Rheumatology.

## 2019-10-07 NOTE — Progress Notes (Addendum)
PATIENT: Nicole Mendoza DOB: Mar 29, 1991  REASON FOR VISIT: follow up HISTORY FROM: patient  Virtual Visit via Telephone Note  I connected with Nicole Mendoza on 10/07/19 at  8:30 AM EDT by telephone and verified that I am speaking with the correct person using two identifiers.   I discussed the limitations, risks, security and privacy concerns of performing an evaluation and management service by telephone and the availability of in person appointments. I also discussed with the patient that there may be a patient responsible charge related to this service. The patient expressed understanding and agreed to proceed.   History of Present Illness:  10/07/19 Nicole Mendoza is a 29 y.o. female here today for follow up of OSA on CPAP.  She reports that she is doing very well with CPAP therapy.  She is using her machine every night.  She does continue close follow-up with primary care post Covid.  She continues to struggle with intermittent fatigue and pain.  She is now working with physical therapy to help with strengthening and endurance.  Compliance report dated 09/06/2019 through 10/05/2019 reveals that she is used CPAP every night for compliance of 100%.  She has used CPAP greater than 4 hours every night.  Average usage is 7 hours and 30 minutes.  Residual AHI 0.4 on 5 to 10 cm of water and an EPR of 1.  There was no significant leak noted.   Observations/Objective:  Generalized: Well developed, in no acute distress  Mentation: Alert oriented to time, place, history taking. Follows all commands speech and language fluent   Assessment and Plan:  29 y.o. year old female  has a past medical history of Allergic rhinitis, Angio-edema, Asthma, Fatty liver, GERD (gastroesophageal reflux disease), Hypertension, Recurrent upper respiratory infection (URI), and Tachycardia. here with    ICD-10-CM   1. OSA on CPAP  G47.33 For home use only DME continuous positive airway pressure (CPAP)   Z99.89     Nicole Mendoza is doing very well with CPAP compliance.  Compliance report reveals excellent compliance.  I have commended her and encouraged her to continue with daily usage and a minimum of 4 hours each night.  She will continue close follow-up with primary care for post Covid symptoms of fatigue and pain.  I have encouraged her to ensure adequate hydration, eat a well-balanced diet and focus on daily exercise.  She will follow-up with [place] in 1 year, sooner if needed.  She verbalizes understanding and agreement with this plan.   Orders Placed This Encounter  Procedures  . For home use only DME continuous positive airway pressure (CPAP)    Supplies    Order Specific Question:   Length of Need    Answer:   Lifetime    Order Specific Question:   Patient has OSA or probable OSA    Answer:   Yes    Order Specific Question:   Is the patient currently using CPAP in the home    Answer:   Yes    Order Specific Question:   Settings    Answer:   Other see comments    Order Specific Question:   CPAP supplies needed    Answer:   Mask, headgear, cushions, filters, heated tubing and water chamber    No orders of the defined types were placed in this encounter.    Follow Up Instructions:  I discussed the assessment and treatment plan with the patient. The patient was provided an opportunity to ask questions and  all were answered. The patient agreed with the plan and demonstrated an understanding of the instructions.   The patient was advised to call back or seek an in-person evaluation if the symptoms worsen or if the condition fails to improve as anticipated.  I provided 15 minutes of non-face-to-face time during this encounter.  Patient is located at her place of residence during my chart visit.  Provider is in the office.   Debbora Presto, NP   I reviewed the above note and documentation by the Nurse Practitioner and agree with the history, exam, assessment and plan as outlined above. I was available for  consultation. Star Age, MD, PhD [facility])

## 2019-10-07 NOTE — Progress Notes (Signed)
Fax confirmation to [facility] for DME cpap supplies for pt.  [phone].  SY

## 2019-10-08 LAB — MAGNESIUM: Magnesium: 2 mg/dL (ref 1.6–2.3)

## 2019-10-08 LAB — SPECIMEN STATUS REPORT

## 2019-10-15 ENCOUNTER — Other Ambulatory Visit: Payer: Self-pay

## 2019-10-15 ENCOUNTER — Ambulatory Visit: Payer: 59 | Attending: Nurse Practitioner

## 2019-10-15 DIAGNOSIS — R5381 Other malaise: Secondary | ICD-10-CM | POA: Insufficient documentation

## 2019-10-15 DIAGNOSIS — Z8616 Personal history of COVID-19: Secondary | ICD-10-CM | POA: Insufficient documentation

## 2019-10-17 NOTE — Therapy (Signed)
[place], [place], [address] Phone: [phone]   Fax:  [phone]  Physical Therapy Evaluation  Patient Details  Name: Nicole Mendoza MRN: [id number] Date of Birth: [birth year] Referring Provider (PT): Fenton Foy, NP   Encounter Date: 10/15/2019  PT End of Session - 10/17/19 1008    Visit Number  1    Number of Visits  13    Date for PT Re-Evaluation  01/30/20    Authorization Type  [employer]    Progress Note Due on Visit  10    PT Start Time  1003    PT Stop Time  1046    PT Time Calculation (min)  43 min    Activity Tolerance  Patient tolerated treatment well    Behavior During Therapy  [facility] for tasks assessed/performed       Past Medical History:  Diagnosis Date  . Allergic rhinitis   . Angio-edema   . Asthma   . Fatty liver   . GERD (gastroesophageal reflux disease)   . Hypertension   . Recurrent upper respiratory infection (URI)   . Tachycardia     History reviewed. No pertinent surgical history.  There were no vitals filed for this visit.   Subjective Assessment - 10/17/19 0951    Subjective  Pt reports she has been experiencing spells of overwhelming weakness and fatigue since having covid-19 last year. Additionally, pt states she has been assessed has having auto-immune issues, but does not know the details. She is waitning on a rheumatology appt which will occur the earliest in middle of May. Pt states she has been Dx c tachycardia.    Limitations  Lifting;Standing;Walking;House hold activities    How long can you sit comfortably?  No issue    How long can you stand comfortably?  15 mins    How long can you walk comfortably?  15 mins    Diagnostic tests  Elevated antinuclear antibody (ANA) level    Patient Stated Goals  To be able to improve my strength and endurance    Currently in Pain?  Yes    Pain Score  2     Pain Location  Other (Comment)   multiple areas    Pain Descriptors / Indicators  Aching    Pain Type  Chronic pain    Pain Radiating Towards  NA    Pain Onset  More than a month ago    Pain Frequency  Intermittent    Aggravating Factors   unknown; activity level    Pain Relieving Factors  Rest; Aleve    Effect of Pain on Daily Activities  Significnat         [facility] PT Assessment - 10/17/19 0001      Assessment   Medical Diagnosis  Physical Deconditioning    Referring Provider (PT)  Fenton Foy, NP    Hand Dominance  Right    Next MD Visit  11/04/59    Prior Therapy  No      Precautions   Precautions  None      Restrictions   Weight Bearing Restrictions  No      Balance Screen   Has the patient fallen in the past 6 months  No    Has the patient had a decrease in activity level because of a fear of falling?   No    Is the patient reluctant to leave their home because of a fear of  falling?   No      Home Environment   Living Environment  Private residence    Living Arrangements  Spouse/significant other;Children    Type of [place] to enter    Entrance Stairs-Number of Steps  5    Entrance Stairs-Rails  Right    Home Layout  One level    [place]  None      Prior Function   Level of Independence  Independent    Vocation  Full time employment    Occupational psychologist- LTC Liaison      Cognition   Overall Cognitive Status  Within Functional Limits for tasks assessed      Sensation   Light Touch  Appears Intact      Coordination   Gross Motor Movements are Fluid and Coordinated  Yes      ROM / Strength   AROM / PROM / Strength  Strength      Strength   Overall Strength Comments  LEs were grossly 5/5      Transfers   Five time sit to stand comments   19.1 sec      Ambulation/Gait   Gait Pattern  Within Functional Limits;Step-through pattern      6 Minute Walk- Baseline   6 Minute Walk- Baseline  yes    HR (bpm)  104    02 Sat (%RA)  93 %    Perceived Rate of  Exertion (Borg)  11- Fairly light      6 minute walk test results    Aerobic Endurance Distance Walked  1140    Endurance additional comments  fatigue of back and LEs                Objective measurements completed on examination: See above findings.              PT Education - 10/17/19 1006    Education Details  Eval findings. POC. Walking program    Person(s) Educated  Patient    Methods  Explanation;Demonstration    Comprehension  Verbalized understanding;Returned demonstration;Verbal cues required;Need further instruction       PT Short Term Goals - 10/17/19 2317      PT SHORT TERM GOAL #1   Title  Pt will be Ind c a HEP to address endurance and strength.    Baseline  No program    Time  3    Period  Weeks    Status  New    Target Date  11/07/19        PT Long Term Goals - 10/17/19 2319      PT LONG TERM GOAL #1   Title  Pt will report an improvement in average pain to 0-4/10 with ADLs.    Baseline  0-8/10    Time  14    Period  Weeks    Status  New    Target Date  01/23/20      PT LONG TERM GOAL #2   Title  Pt will demonstrate an improved 6 min walking test to 1300 ft (MDC)    Baseline  1140    Time  14    Period  Weeks    Status  New    Target Date  01/23/20      PT LONG TERM GOAL #3   Title  Pt will demonstrate an improved STS to 15 sec (a min MDC).  Baseline  Not completed    Time  14    Period  Weeks    Status  New    Target Date  01/23/20      PT LONG TERM GOAL #4   Title  Pt will be ind in a final HEP to address strength and endurance,    Baseline  No program    Time  14    Period  Weeks    Status  New    Target Date  01/23/20             Plan - 10/17/19 2222    Clinical Impression Statement  Pt. presents with decreased tolerance to activity related to Hx of Covid-19 and newly determined auto-immuned issue which needs further assessment. Pt. reports extreme spells of fatigue and weakness. 5x sit to/from  standing and the 6 min walking test found pt's functional status below age/gender norms. Pt will benfit from PT to address endurance and strength.    Personal Factors and Comorbidities  Comorbidity 3+    Comorbidities  Auto-immune issue to be determined, obesity, tachycardia, Hx of Covid-19, asthma    Examination-Activity Limitations  Caring for Others;Carry;Sit;Lift;Toileting;Stand;Stairs;Squat;Reach Overhead;Bend;Bed Mobility;Bathing    Examination-Participation Restrictions  Community Activity    Stability/Clinical Decision Making  Evolving/Moderate complexity    Clinical Decision Making  Moderate    Rehab Potential  Good    PT Frequency  1x / week    PT Duration  12 weeks    PT Treatment/Interventions  ADLs/Self Care Home Management;Biofeedback;DME Instruction;Contrast Bath;Traction;Moist Heat;Iontophoresis 4mg /ml Dexamethasone;Gait training;Stair training;Functional mobility training;Therapeutic activities;Therapeutic exercise;Neuromuscular re-education;Manual techniques;Energy conservation;Patient/family education;Cognitive remediation;Compression bandaging;Taping;Vasopneumatic Device    PT Next Visit Plan  assess respnse to HEP-walking program.    PT Home Exercise Plan  Walking program 3x daily for 5-15 mins as tolerated c gradual progression    Consulted and Agree with Plan of Care  Patient       Patient will benefit from skilled therapeutic intervention in order to improve the following deficits and impairments:  Cardiopulmonary status limiting activity, Decreased activity tolerance, Decreased mobility, Decreased knowledge of precautions, Decreased endurance, Decreased coordination, Decreased range of motion, Decreased strength, Increased edema, Difficulty walking, Pain, Obesity, Impaired vision/preception, Improper body mechanics  Visit Diagnosis: Physical deconditioning - Plan: PT plan of care cert/re-cert  History of [id number] - Plan: PT plan of care cert/re-cert     Problem  List Patient Active Problem List   Diagnosis Date Noted  . Body aches 10/05/2019  . Muscle pain 10/05/2019  . Physical deconditioning 10/05/2019  . Palpitations 09/02/2019  . Cardiac murmur 09/02/2019  . History of 2019 novel coronavirus disease (COVID-19) 09/02/2019  . History of COVID-19 09/01/2019  . Tachycardia 09/01/2019  . Salivary stone 09/01/2019  . Thrush 09/01/2019  . Fatigue 09/01/2019  . Asthma 09/01/2019  . Irregular bowel habits 07/29/2019  . 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 Seasonal and perennial allergic rhinitis 01/22/2019  . Morbid obesity with BMI of 45.0-49.9, adult ([place]) 10/28/2017  . Mild persistent asthma without complication 0000000  . Essential hypertension 04/22/2017  . Dysmenorrhea 06/26/2016  . Menorrhagia with regular cycle 06/26/2016  . Gastroesophageal reflux disease without esophagitis 12/22/2015  . Current use of beta blocker 12/22/2015  . Asthma with acute exacerbation 05/09/2015  . Acute maxillary sinusitis 05/09/2015  . Hypertension 05/09/2015  . Allergic rhinitis 05/09/2015  . Iron deficiency anemia 11/02/2014  . Mild intermittent asthma without complication [id number]  . Morbid obesity due  to excess calories ([place]) 11/02/2014  . Vitamin D deficiency 11/02/2014  . Strain of back 03/25/2014   Gar Ponto MS, PT 10/17/19 11:49 PM  [facility] [address] [place], [place], [address] Phone: [phone]   Fax:  [phone]  Name: Nicole Mendoza MRN: [id number] Date of Birth: 26-Jan-1991

## 2019-10-17 NOTE — Patient Instructions (Signed)
Walking program of 5-15 min as tolerated 3x per day maintaining at a RPE level of Somewhat Hard or less. Make gradual increases in walking distance and intensity.

## 2019-10-28 ENCOUNTER — Telehealth: Payer: Self-pay | Admitting: Cardiology

## 2019-10-28 NOTE — Telephone Encounter (Signed)
Nicole Mendoza is requesting a provider switch from Dr. Geraldo Pitter to Dr. Caryl Comes. Please advise.

## 2019-10-29 ENCOUNTER — Other Ambulatory Visit: Payer: Self-pay

## 2019-10-29 ENCOUNTER — Ambulatory Visit: Payer: 59 | Attending: Nurse Practitioner

## 2019-10-29 VITALS — HR 94

## 2019-10-29 DIAGNOSIS — Z8616 Personal history of COVID-19: Secondary | ICD-10-CM | POA: Insufficient documentation

## 2019-10-29 DIAGNOSIS — R5381 Other malaise: Secondary | ICD-10-CM | POA: Diagnosis not present

## 2019-10-29 NOTE — Telephone Encounter (Signed)
ok 

## 2019-10-30 NOTE — Therapy (Signed)
[place], [place], [address] Phone: [phone]   Fax:  [phone]  Physical Therapy Treatment  Patient Details  Name: Nicole Mendoza MRN: [id number] Date of Birth: 1991-06-07 Referring Provider (PT): Fenton Foy, NP   Encounter Date: 10/29/2019  PT End of Session - 10/30/19 2057    Visit Number  2    Number of Visits  13    Date for PT Re-Evaluation  01/23/20    Authorization Type  [employer]    Progress Note Due on Visit  10    PT Start Time  1049    PT Stop Time  1129    PT Time Calculation (min)  40 min    Activity Tolerance  Patient tolerated treatment well    Behavior During Therapy  [facility] for tasks assessed/performed       Past Medical History:  Diagnosis Date  . Allergic rhinitis   . Angio-edema   . Asthma   . Fatty liver   . GERD (gastroesophageal reflux disease)   . Hypertension   . Recurrent upper respiratory infection (URI)   . Tachycardia     History reviewed. No pertinent surgical history.  Vitals:   10/30/19 2044  Pulse: 94  SpO2: 97%    Subjective Assessment - 10/30/19 2044    Subjective  Pt reprts she has good days and bad days re: fatigue. On good days she reports being able to walk for approx 7 mins, 2x. Pt states she has had a rheumatology assessmnet and the results were negative and her echocardiogram results were good. Regarding the fatigue she is having, pt states other areas are being assessed including disautonomia pots and post viral syndrome.    Currently in Pain?  No/denies         [facility] PT Assessment - 10/30/19 0001      Assessment   Medical Diagnosis  --    Referring Provider (PT)  --    Hand Dominance  --    Next MD Visit  --    Prior Therapy  --      Precautions   Precautions  --      Restrictions   Weight Bearing Restrictions  --      Home Environment   Living Environment  --    Living Arrangements  --    Type of Home  --    Home Access  --    Entrance Stairs-Number of Steps  --    Entrance Stairs-Rails  --    Home Layout  --    Home Equipment  --      Prior Function   Level of Independence  --    Vocation  --    Vocation Requirements  --      Cognition   Overall Cognitive Status  --      Sensation   Light Touch  --      Coordination   Gross Motor Movements are Fluid and Coordinated  --      Strength   Overall Strength Comments  --      Transfers   Five time sit to stand comments   --      Ambulation/Gait   Gait Pattern  --      6 Minute Walk- Baseline   6 Minute Walk- Baseline  --    HR (bpm)  --    02 Sat (%RA)  --    Perceived Rate of Exertion (  Borg)  --      6 minute walk test results    Aerobic Endurance Distance Walked  --    Endurance additional comments  --                    [facility] Adult PT Treatment/Exercise - 10/30/19 0001      Exercises   Exercises  Lumbar;Knee/Hip      Knee/Hip Exercises: Aerobic   Nustep  L5; 8 mins; post O2 sat 96%; HR 95      Knee/Hip Exercises: Standing   Heel Raises  Right;Left;1 set;10 reps    Heel Raises Limitations  c toe lifts at counter    Knee Flexion  Strengthening;Right;Left;1 set;10 reps    Knee Flexion Limitations  at counter    Hip Flexion  Stengthening;Right;Left;1 set;10 reps    Hip Flexion Limitations  at counter    Hip Extension  Stengthening;Right;Left;1 set;10 reps    Extension Limitations  at counter    Other Standing Knee Exercises  hip abd 10x at counter    Other Standing Knee Exercises  mini squats 10x at counter      Knee/Hip Exercises: Seated   Long Arc Quad  Strengthening;Right;Left;1 set;10 reps          Balance Exercises - 10/30/19 2056      Balance Exercises: Standing   Tandem Stance  3 reps;10 secs    SLS  3 reps;20 secs        PT Education - 10/30/19 2056    Education Details  HEP for LE/core strengthening and balance exs    Person(s) Educated  Patient    Methods   Explanation;Demonstration;Tactile cues;Verbal cues;Handout    Comprehension  Verbalized understanding;Returned demonstration;Verbal cues required;Tactile cues required;Need further instruction       PT Short Term Goals - 10/17/19 2317      PT SHORT TERM GOAL #1   Title  Pt will be Ind c a HEP to address endurance and strength.    Baseline  No program    Time  3    Period  Weeks    Status  New    Target Date  11/07/19        PT Long Term Goals - 10/17/19 2319      PT LONG TERM GOAL #1   Title  Pt will report an improvement in average pain to 0-4/10 with ADLs.    Baseline  0-8/10    Time  14    Period  Weeks    Status  New    Target Date  01/23/20      PT LONG TERM GOAL #2   Title  Pt will demonstrate an improved 6 min walking test to 1300 ft (MDC)    Baseline  1140    Time  14    Period  Weeks    Status  New    Target Date  01/23/20      PT LONG TERM GOAL #3   Title  Pt will demonstrate an improved STS to 15 sec (a min MDC).    Baseline  Not completed    Time  14    Period  Weeks    Status  New    Target Date  01/23/20      PT LONG TERM GOAL #4   Title  Pt will be ind in a final HEP to address strength and endurance,    Baseline  No program  Time  14    Period  Weeks    Status  New    Target Date  01/23/20            Plan - 10/30/19 2100    Clinical Impression Statement  Pt's subjective report indicates she has been participating in walking activities as fatigue allows. An initial program to address LE/core strength and balance was initiated today with pt participating well and returning demonstration.    PT Treatment/Interventions  ADLs/Self Care Home Management;Biofeedback;DME Instruction;Contrast Bath;Traction;Moist Heat;Iontophoresis 4mg /ml Dexamethasone;Gait training;Stair training;Functional mobility training;Therapeutic activities;Therapeutic exercise;Neuromuscular re-education;Manual techniques;Energy conservation;Patient/family  education;Cognitive remediation;Compression bandaging;Taping;Vasopneumatic Device    PT Next Visit Plan  Assess response to HEP. progress activity as tolerated.    PT [place]. see instructions    Consulted and Agree with Plan of Care  Patient       Patient will benefit from skilled therapeutic intervention in order to improve the following deficits and impairments:  Cardiopulmonary status limiting activity, Decreased activity tolerance, Decreased mobility, Decreased knowledge of precautions, Decreased endurance, Decreased coordination, Decreased range of motion, Decreased strength, Increased edema, Difficulty walking, Pain, Obesity, Impaired vision/preception, Improper body mechanics  Visit Diagnosis: Physical deconditioning  History of COVID-19     Problem List Patient Active Problem List   Diagnosis Date Noted  . Body aches 10/05/2019  . Muscle pain 10/05/2019  . Physical deconditioning 10/05/2019  . Palpitations 09/02/2019  . Cardiac murmur 09/02/2019  . History of 2019 novel coronavirus disease (COVID-19) 09/02/2019  . History of COVID-19 09/01/2019  . Tachycardia 09/01/2019  . Salivary stone 09/01/2019  . Thrush 09/01/2019  . Fatigue 09/01/2019  . Asthma 09/01/2019  . Irregular bowel habits 07/29/2019  . 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 Seasonal and perennial allergic rhinitis 01/22/2019  . Morbid obesity with BMI of 45.0-49.9, adult ([place]) 10/28/2017  . Mild persistent asthma without complication 0000000  . Essential hypertension 04/22/2017  . Dysmenorrhea 06/26/2016  . Menorrhagia with regular cycle 06/26/2016  . Gastroesophageal reflux disease without esophagitis 12/22/2015  . Current use of beta blocker 12/22/2015  . Asthma with acute exacerbation 05/09/2015  . Acute maxillary sinusitis 05/09/2015  . Hypertension 05/09/2015  . Allergic rhinitis 05/09/2015  . Iron deficiency anemia 11/02/2014  . Mild  intermittent asthma without complication [id number]  . Morbid obesity due to excess calories ([place]) 11/02/2014  . Vitamin D deficiency 11/02/2014  . Strain of back 03/25/2014    Gar Ponto MS, PT 10/30/19 9:13 PM  [facility] [address] [place], [place], [address] Phone: [phone]   Fax:  [phone]  Name: Nicole Mendoza MRN: [id number] Date of Birth: June 28, 1990

## 2019-11-02 NOTE — Telephone Encounter (Signed)
Ok with me 

## 2019-11-04 ENCOUNTER — Other Ambulatory Visit: Payer: Self-pay

## 2019-11-04 ENCOUNTER — Ambulatory Visit: Payer: 59

## 2019-11-04 ENCOUNTER — Ambulatory Visit (INDEPENDENT_AMBULATORY_CARE_PROVIDER_SITE_OTHER): Payer: 59 | Admitting: Nurse Practitioner

## 2019-11-04 VITALS — BP 142/92 | HR 100 | Temp 97.7°F | Wt 290.0 lb

## 2019-11-04 DIAGNOSIS — R439 Unspecified disturbances of smell and taste: Secondary | ICD-10-CM | POA: Diagnosis not present

## 2019-11-04 DIAGNOSIS — Z8616 Personal history of COVID-19: Secondary | ICD-10-CM | POA: Diagnosis not present

## 2019-11-04 NOTE — Assessment & Plan Note (Signed)
Fatigue Muscle pain:  Glad you are improving!  Stay active  Stay well hydrated  Continue PT  Continue gluten free diet - work on healthy weight  Continue CPAP at night  Heart Murmur Heart palpitations:  Please keep follow up with cardiology  Brain Fog Olfactory difunction:  Will try to arrange follow up visit with Dr. Clarisa Fling out given on memory care  Follow up:  Follow up in 2 months or sooner if needed

## 2019-11-04 NOTE — Patient Instructions (Signed)
History of COVID-19 Fatigue Muscle pain:  Glad you are improving!  Stay active  Stay well hydrated  Continue PT  Continue gluten free diet - work on healthy weight  Continue CPAP at night  Heart Murmur Heart palpitations:  Please keep follow up with cardiology  Brain Fog Olfactory difunction:  Will try to arrange follow up visit with Dr. Clarisa Fling out given on memory care  Follow up:  Follow up in 2 months or sooner if needed

## 2019-11-04 NOTE — Progress Notes (Signed)
@Patient  ID: Nicole Mendoza, female    DOB: 1991-03-25, 29 y.o.   MRN: [id number]  Chief Complaint  Patient presents with  . Follow-up    still having some fatigue and body aches    Referring provider: Rubie Maid, MD   29 year old female with hypertension, GERD asthma,sleep apnea (compliant with CPAP)and history of Covid(June 2020).  Recent significant encounters:  09/01/19 [facility] initial visit: refer to cardiology: intermittent tachycardia, fatigue, history of covid, May trial gluten free diet- may help with reducing systemic inflammation that could possibly still be lingering from [place], Walked in office today: Sats remained in high 90's and heart rate went up to 113 BPM.   09/02/19 Cardiology: Ordered 2 week Zio and echo, encouraged weight loss  10/05/19 [facility] Visit: Ordered labs, ordered PT, encouraged gluten free diet.   10/07/19 Neurology: CPAP follow up - good compliance noted  10/25/19 Rheumatology: Full lab work for autoimmune disorder - workup was negative   HPI  Patient presents today for post Covid care clinic follow-up visit.  Patient states that since last visit she has improved.  She does still complain of fatigue and body aches.  She has started working with physical therapy.  She is already noticing an improvement with endurance completing activities of daily living.  She states that her husband commented that he could tell that she has improved.  Patient reports that since starting physical therapy she has missed fewer days at work.  Patient does also continue to have some brain fog and complains of having trouble finding words at times.  She also reports ongoing "smelling smoke" or "burning food sensation".  Patient did follow-up with cardiology.  She has requested to switch to Dr. Caryl Comes in cardiology to be evaluated for POTS.  Patient has been evaluated by rheumatology with full lab work-up for autoimmune disorder and patient  reports that this work-up was negative. Denies f/c/s, n/v/d, hemoptysis, PND, chest pain or edema.       Allergies  Allergen Reactions  . Budesonide-Formoterol Fumarate Other (See Comments)    Causes Hypertension  . Penicillins Hives  . Albuterol Palpitations  . Losartan Rash    Immunization History  Administered Date(s) Administered  . Pneumococcal Polysaccharide-23 05/12/2018    Past Medical History:  Diagnosis Date  . Allergic rhinitis   . Angio-edema   . Asthma   . Fatty liver   . GERD (gastroesophageal reflux disease)   . Hypertension   . Recurrent upper respiratory infection (URI)   . Tachycardia     Tobacco History: Social History   Tobacco Use  Smoking Status Never Smoker  Smokeless Tobacco Never Used   Counseling given: Not Answered   Outpatient Encounter Medications as of 11/04/2019  Medication Sig  . Ascorbic Acid (SUPER C COMPLEX PO)   . cetirizine (ZYRTEC) 10 MG tablet Take by mouth.  . EPINEPHrine (AUVI-Q) 0.3 mg/0.3 mL IJ SOAJ injection Use as directed for severe allergic reactions  . famotidine (PEPCID) 20 MG tablet TAKE 1 TABLET BY MOUTH TWICE A DAY  . ferrous sulfate 325 (65 FE) MG tablet 3 (three) times a week.   . Fluticasone Propionate (XHANCE) 93 MCG/ACT EXHU Place 2 sprays into both nostrils 2 (two) times daily.  . hydrochlorothiazide (HYDRODIURIL) 25 MG tablet Take 25 mg by mouth.  . levalbuterol (XOPENEX HFA) 45 MCG/ACT inhaler Inhale 2 puffs into the lungs every 6 (six) hours as needed for wheezing.  . levalbuterol Penne Lash)  1.25 MG/3ML nebulizer solution USE 1 VIAL BY NEBULIZATION EVERY 6 (SIX) HOURS AS NEEDED FOR WHEEZING.  . metoprolol succinate (TOPROL-XL) 100 MG 24 hr tablet Take 100 mg by mouth daily. Take with or immediately following a meal.  . mometasone (ASMANEX, 30 METERED DOSES,) 220 MCG/INH inhaler Inhale 2 puffs into the lungs 2 (two) times daily.  . montelukast (SINGULAIR) 10 MG tablet Take 1 tablet (10 mg total) by mouth  daily.  . ondansetron (ZOFRAN) 4 MG tablet Take 4 mg by mouth every 8 (eight) hours as needed for nausea or vomiting.  . pantoprazole (PROTONIX) 40 MG tablet Take 40 mg by mouth daily.  . potassium chloride SA (K-DUR) 20 MEQ tablet Take 1 tablet (20 mEq total) by mouth 2 (two) times daily for 5 days.  . Probiotic Product ([place]) CAPS   . VITAMIN D, CHOLECALCIFEROL, PO Take by mouth.  . norethindrone (ORTHO MICRONOR) 0.35 MG tablet Take 0.35 mg by mouth.  . sucralfate (CARAFATE) 1 g tablet TAKE 1 TABLET (1 G TOTAL) BY MOUTH 4 (FOUR) TIMES DAILY - WITH MEALS AND AT BEDTIME.   No facility-administered encounter medications on file as of 11/04/2019.     Review of Systems  Review of Systems  Constitutional: Positive for activity change (decreased) and fatigue. Negative for fever.  HENT: Negative.   Respiratory: Negative for cough and shortness of breath.   Cardiovascular: Positive for palpitations. Negative for chest pain and leg swelling.  Gastrointestinal: Negative.   Musculoskeletal: Positive for myalgias.  Allergic/Immunologic: Negative.   Neurological: Negative.   Psychiatric/Behavioral: Positive for decreased concentration.       Physical Exam  BP (!) 142/92 (BP Location: Left Arm, Patient Position: Sitting, Cuff Size: Large)   Pulse 100   Temp 97.7 F (36.5 C)   Wt 290 lb (131.5 kg)   SpO2 100%   BMI 44.09 kg/m   Wt Readings from Last 5 Encounters:  11/04/19 290 lb (131.5 kg)  10/05/19 288 lb 8 oz (130.9 kg)  09/02/19 286 lb (129.7 kg)  09/01/19 287 lb 8 oz (130.4 kg)  08/10/19 281 lb 9.6 oz (127.7 kg)     Physical Exam Vitals and nursing note reviewed.  Constitutional:      General: She is not in acute distress.    Appearance: She is well-developed.  Cardiovascular:     Rate and Rhythm: Normal rate and regular rhythm.  Pulmonary:     Effort: Pulmonary effort is normal.     Breath sounds: Normal breath sounds.  Musculoskeletal:     Right  lower leg: No edema.     Left lower leg: No edema.  Neurological:     Mental Status: She is alert and oriented to person, place, and time.  Psychiatric:        Mood and Affect: Mood normal.        Behavior: Behavior normal.       Assessment & Plan:   History of 2019 novel coronavirus disease (COVID-19) Fatigue Muscle pain:  Glad you are improving!  Stay active  Stay well hydrated  Continue PT  Continue gluten free diet - work on healthy weight  Continue CPAP at night  Heart Murmur Heart palpitations:  Please keep follow up with cardiology  Brain Fog Olfactory difunction:  Will try to arrange follow up visit with Dr. Clarisa Fling out given on memory care  Follow up:  Follow up in 2 months or sooner if needed  Fenton Foy, NP 11/04/2019

## 2019-11-09 ENCOUNTER — Telehealth: Payer: Self-pay | Admitting: Neurology

## 2019-11-09 NOTE — Telephone Encounter (Signed)
We've received a new internal referral on pt for Olfactory impairment. It looks like patient has seen Dr. Rexene Alberts in the past for sleep, but both patient and referring provider are requesting Dr. Brett Fairy for this specifically. Would you both be ok with this?

## 2019-11-09 NOTE — Telephone Encounter (Signed)
Okay with me 

## 2019-11-10 NOTE — Telephone Encounter (Signed)
OK with me- CD. Can we make this an urgent visit ?

## 2019-11-12 ENCOUNTER — Ambulatory Visit: Payer: 59

## 2019-11-12 ENCOUNTER — Other Ambulatory Visit: Payer: Self-pay

## 2019-11-12 VITALS — HR 97

## 2019-11-12 DIAGNOSIS — R5381 Other malaise: Secondary | ICD-10-CM

## 2019-11-12 DIAGNOSIS — Z8616 Personal history of COVID-19: Secondary | ICD-10-CM

## 2019-11-12 NOTE — Therapy (Signed)
[facility] [address] [place], [place], [address] Phone: [phone]   Fax:  [phone]  Physical Therapy Treatment  Patient Details  Name: Nicole Mendoza MRN: [id number] Date of Birth: Mar 18, 1991 Referring Provider (PT): Ivonne Andrew, NP   Encounter Date: 11/12/2019  PT End of Session - 11/12/19 1924    Visit Number  3    Number of Visits  13    Date for PT Re-Evaluation  01/23/20    Authorization Type  [employer]    PT Start Time  1141    PT Stop Time  1222    PT Time Calculation (min)  41 min    Activity Tolerance  Patient tolerated treatment well    Behavior During Therapy  [facility] for tasks assessed/performed       Past Medical History:  Diagnosis Date  . Allergic rhinitis   . Angio-edema   . Asthma   . Fatty liver   . GERD (gastroesophageal reflux disease)   . Hypertension   . Recurrent upper respiratory infection (URI)   . Tachycardia     History reviewed. No pertinent surgical history.  Vitals:   11/12/19 1921  Pulse: 97  SpO2: 97%    Subjective Assessment - 11/12/19 1149    Subjective  Pt reports this past week has been really good. She is completing her exs and riding an exerise bike or walking 2x a day. She reports no further MD visits re: disautonomia pots.    Currently in Pain?  No/denies                        [facility] Adult PT Treatment/Exercise - 11/12/19 0001      Exercises   Exercises  Lumbar;Knee/Hip;Shoulder      Knee/Hip Exercises: Aerobic   Nustep  L5; 10 mins; post O2 sat 98%; HR 100      Knee/Hip Exercises: Standing   Lateral Step Up  Right;Left;1 set;10 reps;Hand Hold: 1;Step Height: 4"      Shoulder Exercises: Standing   Extension  Strengthening;Both;10 reps;Theraband    Theraband Level (Shoulder Extension)  Level 3 (Green)    Extension Limitations  2 sets    Retraction  Strengthening;Both;10 reps;Theraband    Theraband Level (Shoulder Retraction)   Level 3 (Green)    Retraction Limitations  2 sets    Other Standing Exercises  Bicep curl; Both; 10x2; green T band    Other Standing Exercises  Chest press; both; 10x2; green Tband             PT Education - 11/12/19 1923    Education Details  HEP with UE/core strengthening exs    Person(s) Educated  Patient    Methods  Explanation;Demonstration;Tactile cues;Verbal cues    Comprehension  Verbalized understanding;Returned demonstration;Verbal cues required;Tactile cues required;Need further instruction       PT Short Term Goals - 10/17/19 2317      PT SHORT TERM GOAL #1   Title  Pt will be Ind c a HEP to address endurance and strength.    Baseline  No program    Time  3    Period  Weeks    Status  New    Target Date  11/07/19        PT Long Term Goals - 10/17/19 2319      PT LONG TERM GOAL #1   Title  Pt will report an improvement in average pain to 0-4/10  with ADLs.    Baseline  0-8/10    Time  14    Period  Weeks    Status  New    Target Date  01/23/20      PT LONG TERM GOAL #2   Title  Pt will demonstrate an improved 6 min walking test to 1300 ft (MDC)    Baseline  1140    Time  14    Period  Weeks    Status  New    Target Date  01/23/20      PT LONG TERM GOAL #3   Title  Pt will demonstrate an improved STS to 15 sec (a min MDC).    Baseline  Not completed    Time  14    Period  Weeks    Status  New    Target Date  01/23/20      PT LONG TERM GOAL #4   Title  Pt will be ind in a final HEP to address strength and endurance,    Baseline  No program    Time  14    Period  Weeks    Status  New    Target Date  01/23/20            Plan - 11/12/19 1926    Clinical Impression Statement  Pt participated well in today's PT session which focused on UE/core strengthening exs to be part of her HEP. Pt's subjective report indicates a better week of activity with less fatigue.    PT Treatment/Interventions  ADLs/Self Care Home Management;Biofeedback;DME  Instruction;Contrast Bath;Traction;Moist Heat;Iontophoresis 4mg /ml Dexamethasone;Gait training;Stair training;Functional mobility training;Therapeutic activities;Therapeutic exercise;Neuromuscular re-education;Manual techniques;Energy conservation;Patient/family education;Cognitive remediation;Compression bandaging;Taping;Vasopneumatic Device    PT Next Visit Plan  Assess response to HEP for UE/core exs    PT Home Exercise Plan  [id number]. Standing UE Tband exs were added       Patient will benefit from skilled therapeutic intervention in order to improve the following deficits and impairments:  Cardiopulmonary status limiting activity, Decreased activity tolerance, Decreased mobility, Decreased knowledge of precautions, Decreased endurance, Decreased coordination, Decreased range of motion, Decreased strength, Increased edema, Difficulty walking, Pain, Obesity, Impaired vision/preception, Improper body mechanics  Visit Diagnosis: Physical deconditioning  History of COVID-19     Problem List Patient Active Problem List   Diagnosis Date Noted  . Body aches 10/05/2019  . Muscle pain 10/05/2019  . Physical deconditioning 10/05/2019  . Palpitations 09/02/2019  . Cardiac murmur 09/02/2019  . History of 2019 novel coronavirus disease (COVID-19) 09/02/2019  . History of COVID-19 09/01/2019  . Tachycardia 09/01/2019  . Salivary stone 09/01/2019  . Thrush 09/01/2019  . Fatigue 09/01/2019  . Asthma 09/01/2019  . Irregular bowel habits 07/29/2019  . 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 Seasonal and perennial allergic rhinitis 01/22/2019  . Morbid obesity with BMI of 45.0-49.9, adult (HCC) 10/28/2017  . Mild persistent asthma without complication 07/29/2017  . Essential hypertension 04/22/2017  . Dysmenorrhea 06/26/2016  . Menorrhagia with regular cycle 06/26/2016  . Gastroesophageal reflux disease without esophagitis 12/22/2015  . Current use of beta blocker  12/22/2015  . Asthma with acute exacerbation 05/09/2015  . Acute maxillary sinusitis 05/09/2015  . Hypertension 05/09/2015  . Allergic rhinitis 05/09/2015  . Iron deficiency anemia 11/02/2014  . Mild intermittent asthma without complication 11/02/2014  . Morbid obesity due to excess calories (HCC) 11/02/2014  . Vitamin D deficiency 11/02/2014  . Strain of back 03/25/2014    Freida Busman  Verdie Wilms MS, PT 11/12/19 7:33 PM  [facility] [address] [place], [place], [address] Phone: [phone]   Fax:  [phone]  Name: Nicole Mendoza MRN: [id number] Date of Birth: 1991/03/06

## 2019-11-16 ENCOUNTER — Institutional Professional Consult (permissible substitution): Payer: 59 | Admitting: Neurology

## 2019-11-16 ENCOUNTER — Encounter: Payer: Self-pay | Admitting: Neurology

## 2019-11-16 ENCOUNTER — Telehealth: Payer: Self-pay

## 2019-11-16 NOTE — Telephone Encounter (Signed)
PT no show fee waive per appt note.

## 2019-11-19 ENCOUNTER — Encounter: Payer: Self-pay | Admitting: Allergy & Immunology

## 2019-11-19 ENCOUNTER — Ambulatory Visit (INDEPENDENT_AMBULATORY_CARE_PROVIDER_SITE_OTHER): Payer: 59 | Admitting: Allergy & Immunology

## 2019-11-19 ENCOUNTER — Ambulatory Visit: Payer: 59 | Attending: Nurse Practitioner

## 2019-11-19 ENCOUNTER — Other Ambulatory Visit: Payer: Self-pay

## 2019-11-19 VITALS — HR 105

## 2019-11-19 DIAGNOSIS — J4540 Moderate persistent asthma, uncomplicated: Secondary | ICD-10-CM | POA: Diagnosis not present

## 2019-11-19 DIAGNOSIS — R5381 Other malaise: Secondary | ICD-10-CM | POA: Insufficient documentation

## 2019-11-19 DIAGNOSIS — T7800XD Anaphylactic reaction due to unspecified food, subsequent encounter: Secondary | ICD-10-CM | POA: Diagnosis not present

## 2019-11-19 DIAGNOSIS — Z8616 Personal history of COVID-19: Secondary | ICD-10-CM | POA: Diagnosis present

## 2019-11-19 DIAGNOSIS — J3089 Other allergic rhinitis: Secondary | ICD-10-CM | POA: Diagnosis not present

## 2019-11-19 DIAGNOSIS — J302 Other seasonal allergic rhinitis: Secondary | ICD-10-CM

## 2019-11-19 DIAGNOSIS — K219 Gastro-esophageal reflux disease without esophagitis: Secondary | ICD-10-CM | POA: Diagnosis not present

## 2019-11-19 MED ORDER — PREDNISONE 10 MG PO TABS: ORAL_TABLET | ORAL | 0 refills | Status: DC

## 2019-11-19 NOTE — Progress Notes (Signed)
RE: Nicole Mendoza MRN: [id number] DOB: 09-26-1990 Date of Telemedicine Visit: 11/19/2019  Referring provider: Rubie Maid, MD Primary care provider: Rubie Maid, MD  Chief Complaint: Allergies (sneezing, itchy/watery eyes, nasal conestion ) and Asthma (cough, sob started early this week )   Telemedicine Follow Up Visit via Telephone: I connected with [name] for a follow up on 11/19/19 by telephone and verified that I am speaking with the correct person using two identifiers.   I discussed the limitations, risks, security and privacy concerns of performing an evaluation and management service by telephone and the availability of in person appointments. I also discussed with the patient that there may be a patient responsible charge related to this service. The patient expressed understanding and agreed to proceed.  Patient is in her car.  Provider is at the office.  Visit start time: 10:30 AM Visit end time: 10:52 AM Insurance consent/check in by: [facility] consent and medical assistant/nurse: Lachelle  History of Present Illness:  She is a 29 y.o. female, who is being followed for a multitude of atopic complaints. Her previous allergy office visit was in March 2021 with myself.  At that visit, we did not make any medication changes.  For her rhinitis, we will continue with Zyrtec, Singulair, and XHANCE.  We did start her on a short low-dose prednisone burst.  Recommended continued avoidance of peanuts and tree nuts.  Her lung function looks stable.  Would continue with Asmanex 2 puffs twice daily as well as Singulair.  We previously referred her to the COVID-19 clinic due to a variety of symptoms she experienced after having a COVID-19 diagnosis.  Since the last visit, she has done fairly well.  She has thoroughly enjoyed her time of the Covid clinic. She did a 6 minute walking test at the [facility] and she could not make it. She was referred to physical therapy and  she is now doing better from a fatigue perspective. She is graduated to every other month. She ended up going to Rheumatology and everything was good. She was placed on a Holter monitor at Cardiology and she reached up to 180bpm. She is going to see Dr. Caryl Comes.  There is discussion of a possible POTS diagnosis.  Earlier in the week, she has been having itchy eyes and sneezing. Then she started smelling the cigarette smoke again and has had a headache since Tuesday. She has had a lot of pressure in her head and she has noticed some tickling in her chest with rescue inhaler use. She used it twice today. Her sats have been good.  She does have a pulse ox at home from her Covid diagnosis and checks occasionally.  She has had discharge, but it has not been purulent.  She is not excited about an antibiotic.  She is planning to get [employer] vaccine.  They are offering all 3 vaccines at her workplace.  She is comfortable getting it there since our nurses and doctors around.  She is wondering which one I recommended.  She has never had an adverse reaction to the vaccine.  She has tolerated bowel preps in the past.  She has a 29yo female who they have legal guardianship on. Biological mom has not signed over rights. He has been doing great with them.    Otherwise, there have been no changes to her past medical history, surgical history, family history, or social history.  Assessment and Plan:  [name] is a 29 y.o. female with:  Mild persistent asthma - well controlled on the Asmanex (holding Fasenra)  Recurrent infections- improved following Pneumovax (protection to 3/23 serotypes initially to 16/23 serotypes post-really terrible vaccination of Pneumococcus),but now with worsening frequency of infections   Seasonal and perennial allergic rhinitis(trees, weeds, grasses and dust mites) - with acute exacerbation of her symptoms  Gastroesophageal reflux disease- onpantoprazo we can leand famotidineand  PRN carafate   Adverse food reactions -resolvedwith minimizing exposure to food additives and avoiding peanuts/tree nuts  Snoring with perceived poor sleep quality-improved with use of the CPAP  Possible POTS syndrome   Ms. Gambino is doing well from an atopic perspective. She appears to be having increased mucus production from environmental allergies.  We are going to treat her with a short burst of prednisone to keep things under control.  She is using all of her medications appropriately.  We had discussed allergen immunotherapy in the past, but wanted to get her more stable from all of her other conditions.  Hopefully we can start this later this year or early next year.  She will give [place] an update and contact me if she needs any antibiotics over the weekend.  She prefers to stay off of them.  Diagnostics: None.  Medication List:  Current Outpatient Medications  Medication Sig Dispense Refill  . cetirizine (ZYRTEC) 10 MG tablet Take by mouth.    . EPINEPHrine (AUVI-Q) 0.3 mg/0.3 mL IJ SOAJ injection Use as directed for severe allergic reactions 4 each 1  . famotidine (PEPCID) 20 MG tablet TAKE 1 TABLET BY MOUTH TWICE A DAY 60 tablet 5  . levalbuterol (XOPENEX HFA) 45 MCG/ACT inhaler Inhale 2 puffs into the lungs every 6 (six) hours as needed for wheezing. 1 Inhaler 1  . levalbuterol (XOPENEX) 1.25 MG/3ML nebulizer solution USE 1 VIAL BY NEBULIZATION EVERY 6 (SIX) HOURS AS NEEDED FOR WHEEZING. 90 mL 1  . mometasone (ASMANEX, 30 METERED DOSES,) 220 MCG/INH inhaler Inhale 2 puffs into the lungs 2 (two) times daily. 1 Inhaler 5  . montelukast (SINGULAIR) 10 MG tablet Take 1 tablet (10 mg total) by mouth daily. 30 tablet 5  . Ascorbic Acid (SUPER C COMPLEX PO)     . ferrous sulfate 325 (65 FE) MG tablet 3 (three) times a week.     . Fluticasone Propionate (XHANCE) 93 MCG/ACT EXHU Place 2 sprays into both nostrils 2 (two) times daily. 32 mL 5  . hydrochlorothiazide (HYDRODIURIL) 25 MG  tablet Take 25 mg by mouth.    . metoprolol succinate (TOPROL-XL) 100 MG 24 hr tablet Take 100 mg by mouth daily. Take with or immediately following a meal.    . norethindrone (ORTHO MICRONOR) 0.35 MG tablet Take 0.35 mg by mouth.    . ondansetron (ZOFRAN) 4 MG tablet Take 4 mg by mouth every 8 (eight) hours as needed for nausea or vomiting.    . pantoprazole (PROTONIX) 40 MG tablet Take 40 mg by mouth daily.  3  . potassium chloride SA (K-DUR) 20 MEQ tablet Take 1 tablet (20 mEq total) by mouth 2 (two) times daily for 5 days. 10 tablet 0  . Probiotic Product ([place]) CAPS     . sucralfate (CARAFATE) 1 g tablet TAKE 1 TABLET (1 G TOTAL) BY MOUTH 4 (FOUR) TIMES DAILY - WITH MEALS AND AT BEDTIME. 120 tablet 4  . VITAMIN D, CHOLECALCIFEROL, PO Take by mouth.     No current facility-administered medications for this visit.   Allergies: Allergies  Allergen Reactions  .  Budesonide-Formoterol Fumarate Other (See Comments)    Causes Hypertension  . Penicillins Hives  . Albuterol Palpitations  . Losartan Rash   I reviewed her past medical history, social history, family history, and environmental history and no significant changes have been reported from previous visits.  Review of Systems  Constitutional: Negative for activity change, appetite change, chills, fatigue and fever.  HENT: Positive for postnasal drip and rhinorrhea. Negative for congestion, sinus pressure and sore throat.   Eyes: Negative for pain, discharge, redness and itching.  Respiratory: Negative for shortness of breath, wheezing and stridor.   Gastrointestinal: Negative for diarrhea, nausea and vomiting.  Endocrine: Negative for cold intolerance and heat intolerance.  Musculoskeletal: Negative for arthralgias, joint swelling and myalgias.  Skin: Negative for rash.  Allergic/Immunologic: Negative for environmental allergies and food allergies.    Objective:  Physical exam not obtained as encounter was  done via telephone.   Previous notes and tests were reviewed.  I discussed the assessment and treatment plan with the patient. The patient was provided an opportunity to ask questions and all were answered. The patient agreed with the plan and demonstrated an understanding of the instructions.   The patient was advised to call back or seek an in-person evaluation if the symptoms worsen or if the condition fails to improve as anticipated.  I provided 22 minutes of non-face-to-face time during this encounter.  It was my pleasure to participate in [place] care today. Please feel free to contact me with any questions or concerns.   Sincerely,  Valentina Shaggy, MD

## 2019-11-19 NOTE — Therapy (Signed)
[place], [place], [address] Phone: [phone]   Fax:  [phone]  Physical Therapy Treatment  Patient Details  Name: Nicole Mendoza MRN: [id number] Date of Birth: 1991/02/07 Referring Provider (PT): Fenton Foy, NP   Encounter Date: 11/19/2019  PT End of Session - 11/19/19 1611    Visit Number  4    Number of Visits  13    Date for PT Re-Evaluation  01/23/20    Authorization Type  [employer]    Progress Note Due on Visit  10    PT Start Time  1053    PT Stop Time  1134    PT Time Calculation (min)  41 min    Activity Tolerance  Patient tolerated treatment well    Behavior During Therapy  [facility] for tasks assessed/performed       Past Medical History:  Diagnosis Date  . Allergic rhinitis   . Angio-edema   . Asthma   . Fatty liver   . GERD (gastroesophageal reflux disease)   . Hypertension   . Recurrent upper respiratory infection (URI)   . Tachycardia     History reviewed. No pertinent surgical history.  Vitals:   11/19/19 1614  Pulse: (!) 105  SpO2: 98%    Subjective Assessment - 11/19/19 1102    Subjective  Pt reports she was doing really well until she had a allergy/asthma flare a few days ago. Prior to this she reports taking her son to a playground and being able to play with him on the equipment when n the past she has usually just watched.    Limitations  Lifting;Standing;Walking;House hold activities    How long can you sit comfortably?  No issue    How long can you stand comfortably?  15 mins    How long can you walk comfortably?  20 mins    Patient Stated Goals  To be able to improve my strength and endurance    Currently in Pain?  No/denies                        [facility] Adult PT Treatment/Exercise - 11/19/19 0001      Exercises   Exercises  Lumbar;Knee/Hip;Shoulder      Knee/Hip Exercises: Aerobic   Nustep  L5; 10 mins; post O2 sat 99%; HR 104       Knee/Hip Exercises: Standing   Heel Raises  Right;Left;1 set;10 reps    Heel Raises Limitations  c toe lifts at counter    Knee Flexion  Strengthening;Right;Left;1 set;10 reps    Knee Flexion Limitations  at counter   green Tband   Hip Flexion  Stengthening;Right;Left;1 set;10 reps    Hip Flexion Limitations  at counter   green Tband   Hip Abduction  Stengthening;Right;Left;10 reps    Abduction Limitations  at counter   green Tband   Hip Extension  Stengthening;Right;Left;1 set;10 reps    Extension Limitations  at counter   green Tband     Shoulder Exercises: Standing   Extension  Strengthening;Both;10 reps;Theraband    Theraband Level (Shoulder Extension)  Level 3 (Green)    Extension Limitations  2 sets    Retraction  Strengthening;Both;10 reps;Theraband    Theraband Level (Shoulder Retraction)  Level 3 (Green)    Retraction Limitations  2 sets    Other Standing Exercises  Bicep curl; Both; 10x2; green T band    Other Standing Exercises  Chest press; both; 10x2; green Tband             PT Education - 11/19/19 1609    Education Details  Pt is to use greem Tband with her 4 wal standing LE exs for resistance    Person(s) Educated  Patient    Methods  Explanation;Demonstration    Comprehension  Verbalized understanding;Returned demonstration       PT Short Term Goals - 10/17/19 2317      PT SHORT TERM GOAL #1   Title  Pt will be Ind c a HEP to address endurance and strength.    Baseline  No program    Time  3    Period  Weeks    Status  New    Target Date  11/07/19        PT Long Term Goals - 10/17/19 2319      PT LONG TERM GOAL #1   Title  Pt will report an improvement in average pain to 0-4/10 with ADLs.    Baseline  0-8/10    Time  14    Period  Weeks    Status  New    Target Date  01/23/20      PT LONG TERM GOAL #2   Title  Pt will demonstrate an improved 6 min walking test to 1300 ft (MDC)    Baseline  1140    Time  14    Period  Weeks     Status  New    Target Date  01/23/20      PT LONG TERM GOAL #3   Title  Pt will demonstrate an improved STS to 15 sec (a min MDC).    Baseline  Not completed    Time  14    Period  Weeks    Status  New    Target Date  01/23/20      PT LONG TERM GOAL #4   Title  Pt will be ind in a final HEP to address strength and endurance,    Baseline  No program    Time  14    Period  Weeks    Status  New    Target Date  01/23/20            Plan - 11/19/19 1615    Clinical Impression Statement  With allergy/asthma issues this week, pt expressed concern about her ability to complete PT today. Pt reports no issues with core?UE exs added to her HEP. Pt particpated with good effort without need to use her inhaler. Strengthening LE exs were made more difficult by green Tband resistance. Pt was encouraged prior to allergy/asthma issues starting, that she has been able to be more active with her 14yo son.    Personal Factors and Comorbidities  Comorbidity 3+    Comorbidities  Auto-immune issue to be determined, obesity, tachycardia, Hx of Covid-19, asthma    Examination-Activity Limitations  Caring for Others;Carry;Sit;Lift;Toileting;Stand;Stairs;Squat;Reach Overhead;Bend;Bed Mobility;Bathing    Examination-Participation Restrictions  Community Activity    Stability/Clinical Decision Making  Evolving/Moderate complexity    Clinical Decision Making  Moderate    Rehab Potential  Good    PT Frequency  1x / week    PT Duration  12 weeks    PT Treatment/Interventions  ADLs/Self Care Home Management;Biofeedback;DME Instruction;Contrast Bath;Traction;Moist Heat;Iontophoresis 4mg /ml Dexamethasone;Gait training;Stair training;Functional mobility training;Therapeutic activities;Therapeutic exercise;Neuromuscular re-education;Energy conservation;Patient/family education;Cognitive remediation;Compression bandaging;Taping;Vasopneumatic Device    PT Next Visit Plan  Re-assess 5x STS and 6  min walking test    PT  Home Exercise Plan  [id number]. Green Tband added to standing LE exs.       Patient will benefit from skilled therapeutic intervention in order to improve the following deficits and impairments:  Cardiopulmonary status limiting activity, Decreased activity tolerance, Decreased mobility, Decreased knowledge of precautions, Decreased endurance, Decreased coordination, Decreased range of motion, Decreased strength, Increased edema, Difficulty walking, Pain, Obesity, Impaired vision/preception, Improper body mechanics  Visit Diagnosis: Physical deconditioning  History of COVID-19     Problem List Patient Active Problem List   Diagnosis Date Noted  . Body aches 10/05/2019  . Muscle pain 10/05/2019  . Physical deconditioning 10/05/2019  . Palpitations 09/02/2019  . Cardiac murmur 09/02/2019  . History of 2019 novel coronavirus disease (COVID-19) 09/02/2019  . History of COVID-19 09/01/2019  . Tachycardia 09/01/2019  . Salivary stone 09/01/2019  . Thrush 09/01/2019  . Fatigue 09/01/2019  . Asthma 09/01/2019  . Irregular bowel habits 07/29/2019  . 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 Seasonal and perennial allergic rhinitis 01/22/2019  . Morbid obesity with BMI of 45.0-49.9, adult ([place]) 10/28/2017  . Mild persistent asthma without complication 93/81/8299  . Essential hypertension 04/22/2017  . Dysmenorrhea 06/26/2016  . Menorrhagia with regular cycle 06/26/2016  . Gastroesophageal reflux disease without esophagitis 12/22/2015  . Current use of beta blocker 12/22/2015  . Asthma with acute exacerbation 05/09/2015  . Acute maxillary sinusitis 05/09/2015  . Hypertension 05/09/2015  . Allergic rhinitis 05/09/2015  . Iron deficiency anemia 11/02/2014  . Mild intermittent asthma without complication 37/16/9678  . Morbid obesity due to excess calories ([place]) 11/02/2014  . Vitamin D deficiency 11/02/2014  . Strain of back 03/25/2014    Gar Ponto MS,  PT 11/19/19 4:39 PM  [facility] [address] [place], [place], [address] Phone: [phone]   Fax:  [phone]  Name: Nicole Mendoza MRN: [id number] Date of Birth: 28-Aug-1990

## 2019-11-26 ENCOUNTER — Ambulatory Visit: Payer: 59

## 2019-11-30 ENCOUNTER — Telehealth: Payer: Self-pay | Admitting: Allergy & Immunology

## 2019-11-30 MED ORDER — CEFDINIR 300 MG PO CAPS: 300.0000 mg | ORAL_CAPSULE | Freq: Two times a day (BID) | ORAL | 0 refills | Status: AC

## 2019-11-30 NOTE — Telephone Encounter (Signed)
Patient called to report worsening sinus congestion and sinus pain since I saw her earlier this month. Therefore I sent in cefdinir 300mg  BID. She has tolerated this antibiotic in the past without a problem. Patient informed.   Salvatore Marvel, MD [facility]

## 2019-12-03 ENCOUNTER — Other Ambulatory Visit: Payer: Self-pay

## 2019-12-03 ENCOUNTER — Ambulatory Visit: Payer: 59

## 2019-12-03 DIAGNOSIS — Z8616 Personal history of COVID-19: Secondary | ICD-10-CM

## 2019-12-03 DIAGNOSIS — R5381 Other malaise: Secondary | ICD-10-CM | POA: Diagnosis not present

## 2019-12-05 NOTE — Therapy (Signed)
[place], [place], [address] Phone: [phone]   Fax:  [phone]  Physical Therapy Treatment  Patient Details  Name: Nicole Mendoza MRN: [id number] Date of Birth: 1990/08/30 Referring Provider (PT): Fenton Foy, NP   Encounter Date: 12/03/2019   PT End of Session - 12/05/19 1621    Visit Number 5    Number of Visits 13    Date for PT Re-Evaluation 01/23/20    Authorization Type [employer]    Progress Note Due on Visit 10    PT Start Time 1049    PT Stop Time 1135    PT Time Calculation (min) 46 min    Activity Tolerance Patient tolerated treatment well    Behavior During Therapy [facility] for tasks assessed/performed           Past Medical History:  Diagnosis Date  . Allergic rhinitis   . Angio-edema   . Asthma   . Fatty liver   . GERD (gastroesophageal reflux disease)   . Hypertension   . Recurrent upper respiratory infection (URI)   . Tachycardia     History reviewed. No pertinent surgical history.  There were no vitals filed for this visit.   Subjective Assessment - 12/05/19 1610    Subjective Pt reports she has had a tough week with increased body pain and fatigue. Pt states she has been picking up her son more this week. Pt reports she has been able to complete her exs 1xper day. Pt reports she has her cardiologist appt on 12/06/19 re; possible POTS.    Currently in Pain? Yes    Pain Score 4     Pain Location Back    Pain Orientation Mid    Pain Descriptors / Indicators Aching;Tightness    Pain Type Chronic pain    Pain Onset More than a month ago    Pain Frequency Intermittent              [facility] PT Assessment - 12/05/19 0001      Transfers   Five time sit to stand comments  15.9      6 minute walk test results    Aerobic Endurance Distance Walked 1360    Endurance additional comments Pt was able to converse while walking                          [facility] Adult PT Treatment/Exercise - 12/05/19 0001      Exercises   Exercises Lumbar;Knee/Hip;Shoulder      Lumbar Exercises: Stretches   Active Hamstring Stretch Right;Left;2 reps;20 seconds    Active Hamstring Stretch Limitations Seated    Quadruped Mid Back Stretch 2 reps;20 seconds    Piriformis Stretch Right;Left;2 reps;20 seconds    Other Lumbar Stretch Exercise Cat/camel; 3x ; 20 sec    Other Lumbar Stretch Exercise SL open book; L and R; 2x; 20 sce      Knee/Hip Exercises: Aerobic   Nustep L6; 10 mins; o2 sat 99%, HR 104                  PT Education - 12/05/19 1619    Education Details HEP: mid back and LE stretches added- hamstring, piriformis, cat/ camel, SL open book    Person(s) Educated Patient    Methods Explanation;Demonstration;Tactile cues;Verbal cues    Comprehension Verbalized understanding;Returned demonstration;Verbal cues required;Tactile cues required;Need further instruction  PT Short Term Goals - 12/05/19 1631      PT SHORT TERM GOAL #1   Title Pt will be Ind c a HEP to address endurance and strength. Met    Baseline No program    Status Achieved             PT Long Term Goals - 12/05/19 1631      PT LONG TERM GOAL #2   Title Pt will demonstrate an improved 6 min walking test to 1300 ft ([place]). [id number]    Baseline 1140    Status Achieved      PT LONG TERM GOAL #3   Title Pt will demonstrate an improved STS to 15 sec (a min MDC). Partially met- 15.9    Status Partially Met    Target Date 01/23/20                 Plan - 12/05/19 1622    Clinical Impression Statement Pt was provided stretching exs for back and LEs with report of mid back soreness/tightness. Pt completed the exs properly. Re-tested functional tests for 5x STS and 6 min walk. Both measures were improved. Pt was able to walk at a faster pace and maintain a conversation.    Personal Factors and Comorbidities Comorbidity 3+    Comorbidities  Auto-immune issue to be determined, obesity, tachycardia, Hx of Covid-19, asthma    Examination-Activity Limitations Caring for Others;Carry;Sit;Lift;Toileting;Stand;Stairs;Squat;Reach Overhead;Bend;Bed Mobility;Bathing    Examination-Participation Restrictions Community Activity    Stability/Clinical Decision Making Evolving/Moderate complexity    Clinical Decision Making Moderate    Rehab Potential Good    PT Frequency 1x / week    PT Duration 12 weeks    PT Treatment/Interventions ADLs/Self Care Home Management;Biofeedback;DME Instruction;Contrast Bath;Traction;Moist Heat;Iontophoresis 420mml Dexamethasone;Gait training;Stair training;Functional mobility training;Therapeutic activities;Therapeutic exercise;Neuromuscular re-education;Energy conservation;Patient/family education;Cognitive remediation;Compression bandaging;Taping;Vasopneumatic Device    PT Next Visit Plan Assess response to stretches exs for backa nd LEs    PT Home Exercise Plan [id number]. HEP: mid back and LE stretches added- hamstring, piriformis, cat/ camel, SL open book    Consulted and Agree with Plan of Care Patient           Patient will benefit from skilled therapeutic intervention in order to improve the following deficits and impairments:  Cardiopulmonary status limiting activity, Decreased activity tolerance, Decreased mobility, Decreased knowledge of precautions, Decreased endurance, Decreased coordination, Decreased range of motion, Decreased strength, Increased edema, Difficulty walking, Pain, Obesity, Impaired vision/preception, Improper body mechanics  Visit Diagnosis: Physical deconditioning  History of COVID-19     Problem List Patient Active Problem List   Diagnosis Date Noted  . Body aches 10/05/2019  . Muscle pain 10/05/2019  . Physical deconditioning 10/05/2019  . Palpitations 09/02/2019  . Cardiac murmur 09/02/2019  . History of 2019 novel coronavirus disease (COVID-19) 09/02/2019  . History  of COVID-19 09/01/2019  . Tachycardia 09/01/2019  . Salivary stone 09/01/2019  . Thrush 09/01/2019  . Fatigue 09/01/2019  . Asthma 09/01/2019  . Irregular bowel habits 07/29/2019  . 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 Seasonal and perennial allergic rhinitis 01/22/2019  . Morbid obesity with BMI of 45.0-49.9, adult ([place]05/14/2019  . Mild persistent asthma without complication 0296/78/9381. Essential hypertension 04/22/2017  . Dysmenorrhea 06/26/2016  . Menorrhagia with regular cycle 06/26/2016  . Gastroesophageal reflux disease without esophagitis 12/22/2015  . Current use of beta blocker 12/22/2015  . Asthma with acute exacerbation 05/09/2015  . Acute maxillary sinusitis  05/09/2015  . Hypertension 05/09/2015  . Allergic rhinitis 05/09/2015  . Iron deficiency anemia 11/02/2014  . Mild intermittent asthma without complication 73/53/2992  . Morbid obesity due to excess calories ([place]) 11/02/2014  . Vitamin D deficiency 11/02/2014  . Strain of back 03/25/2014    Gar Ponto MS, PT 12/05/19 4:38 PM  [facility] [address] [place], [place], [address] Phone: [phone]   Fax:  [phone]  Name: Nicole Mendoza MRN: [id number] Date of Birth: [birth year]

## 2019-12-06 ENCOUNTER — Other Ambulatory Visit: Payer: Self-pay

## 2019-12-06 ENCOUNTER — Encounter: Payer: Self-pay | Admitting: Internal Medicine

## 2019-12-06 ENCOUNTER — Ambulatory Visit (INDEPENDENT_AMBULATORY_CARE_PROVIDER_SITE_OTHER): Payer: 59 | Admitting: Internal Medicine

## 2019-12-06 VITALS — BP 140/72 | HR 90 | Ht 68.0 in | Wt 285.0 lb

## 2019-12-06 DIAGNOSIS — I10 Essential (primary) hypertension: Secondary | ICD-10-CM | POA: Diagnosis not present

## 2019-12-06 DIAGNOSIS — R002 Palpitations: Secondary | ICD-10-CM | POA: Diagnosis not present

## 2019-12-06 NOTE — Progress Notes (Signed)
ELECTROPHYSIOLOGY CONSULT NOTE  Patient ID: Nicole Mendoza, MRN: [id number], DOB/AGE: Jun 14, 1991 29 y.o. Admit date: (Not on file) Date of Consult: 12/06/2019  Primary Physician: Rubie Maid, MD Primary Cardiologist: RR     Nicole Mendoza is a 29 y.o. female who is being seen today for the evaluation of POTS  at the request of RE.    HPI [name] is a 29 y.o. female will longstanding history of hypertension and palpitations. She was diagnosed with Covid 6/20 and since then she has noted worsening of her chronic pains, GI symptoms, which have been describes as irritable bowel, debilitating symptoms of fatigue, lasting hours and tachypalpitations  Her palpitations are frequently associated with effort.  She has used a pulse oximeter at home and is noted heart rates in the 130s with standing which have been ameliorated by significant fluid intake.  Most recently this occurred last week with dizziness and presyncope.  There is also associated leg tingling.  Never taken her BP   Has hx of recurrent hypokalemia ( (CE)) dating back to 2013  ( no eval for hyperaldosteronism that I know- as part of her prior eval which sounds like it included renovascular studies)  Has hx of shower intolerance and menses intolerance, not able to take estrogen associated contraception because of concerns of elevated blood pressure.  Apparently no concerns about PCOS.  Blood pressure has been managed with progressively high doses of beta-blocker; lisinopril was complicated by cough losartan by rash HydroDIURIL has been use adjunctively aggravating or complicated by hypokalemia.   DATE TEST EF   3/21 Echo   55-65 %         Date Cr K Hgb  4/21 0.74 3.8 13.3            Past Medical History:  Diagnosis Date  . Allergic rhinitis   . Angio-edema   . Asthma   . Fatty liver   . GERD (gastroesophageal reflux disease)   . Hypertension   . Recurrent upper respiratory infection (URI)   .  Tachycardia       Surgical History: History reviewed. No pertinent surgical history.   Home Meds: Current Meds  Medication Sig  . Ascorbic Acid (SUPER C COMPLEX PO)   . cefdinir (OMNICEF) 300 MG capsule Take 1 capsule (300 mg total) by mouth 2 (two) times daily for 10 days.  . cetirizine (ZYRTEC) 10 MG tablet Take by mouth.  . EPINEPHrine (AUVI-Q) 0.3 mg/0.3 mL IJ SOAJ injection Use as directed for severe allergic reactions  . famotidine (PEPCID) 20 MG tablet TAKE 1 TABLET BY MOUTH TWICE A DAY  . ferrous sulfate 325 (65 FE) MG tablet 3 (three) times a week.   . Fluticasone Propionate (XHANCE) 93 MCG/ACT EXHU Place 2 sprays into both nostrils 2 (two) times daily.  . hydrochlorothiazide (HYDRODIURIL) 25 MG tablet Take 25 mg by mouth.  . hyoscyamine (ANASPAZ) 0.125 MG TBDP disintergrating tablet Take 1 tablet by mouth as needed for irritation.  Marland Kitchen levalbuterol (XOPENEX HFA) 45 MCG/ACT inhaler Inhale 2 puffs into the lungs every 6 (six) hours as needed for wheezing.  . levalbuterol (XOPENEX) 1.25 MG/3ML nebulizer solution USE 1 VIAL BY NEBULIZATION EVERY 6 (SIX) HOURS AS NEEDED FOR WHEEZING.  . metoprolol succinate (TOPROL-XL) 100 MG 24 hr tablet Take 100 mg by mouth daily. Take with or immediately following a meal.  . mometasone (ASMANEX, 30 METERED DOSES,) 220 MCG/INH inhaler Inhale 2 puffs into the lungs 2 (two) times daily.  Marland Kitchen  montelukast (SINGULAIR) 10 MG tablet Take 1 tablet (10 mg total) by mouth daily.  . norethindrone (ORTHO MICRONOR) 0.35 MG tablet Take 0.35 mg by mouth.  . ondansetron (ZOFRAN) 4 MG tablet Take 4 mg by mouth every 8 (eight) hours as needed for nausea or vomiting.  . pantoprazole (PROTONIX) 40 MG tablet Take 40 mg by mouth daily.  . potassium chloride SA (K-DUR) 20 MEQ tablet Take 1 tablet (20 mEq total) by mouth 2 (two) times daily for 5 days.  . predniSONE (DELTASONE) 10 MG tablet Take two tablets (20mg ) twice daily for three days, then one tablet (10mg ) twice daily  for three days, then STOP.  . Probiotic Product ([place]) CAPS   . VITAMIN D, CHOLECALCIFEROL, PO Take by mouth.    Allergies:  Allergies  Allergen Reactions  . Budesonide-Formoterol Fumarate Other (See Comments)    Causes Hypertension  . Penicillins Hives  . Albuterol Palpitations  . Losartan Rash    Social History   Socioeconomic History  . Marital status: Married    Spouse name: Not on file  . Number of children: 0  . Years of education: Not on file  . Highest education level: Not on file  Occupational History  . Not on file  Tobacco Use  . Smoking status: Never Smoker  . Smokeless tobacco: Never Used  Vaping Use  . Vaping Use: Never used  Substance and Sexual Activity  . Alcohol use: No    Alcohol/week: 0.0 standard drinks  . Drug use: No  . Sexual activity: Yes    Birth control/protection: Pill  Other Topics Concern  . Not on file  Social History Narrative  . Not on file   Social Determinants of Health   Financial Resource Strain:   . Difficulty of Paying Living Expenses:   Food Insecurity:   . Worried About Charity fundraiser in the Last Year:   . Arboriculturist in the Last Year:   Transportation Needs:   . Film/video editor (Medical):   Marland Kitchen Lack of Transportation (Non-Medical):   Physical Activity:   . Days of Exercise per Week:   . Minutes of Exercise per Session:   Stress:   . Feeling of Stress :   Social Connections:   . Frequency of Communication with Friends and Family:   . Frequency of Social Gatherings with Friends and Family:   . Attends Religious Services:   . Active Member of Clubs or Organizations:   . Attends Archivist Meetings:   Marland Kitchen Marital Status:   Intimate Partner Violence:   . Fear of Current or Ex-Partner:   . Emotionally Abused:   Marland Kitchen Physically Abused:   . Sexually Abused:      Family History  Problem Relation Age of Onset  . Allergic rhinitis Father   . Asthma Father   . Allergic rhinitis  Sister   . Asthma Sister   . Angioedema Neg Hx   . Atopy Neg Hx   . Eczema Neg Hx   . Immunodeficiency Neg Hx   . Urticaria Neg Hx      ROS:  Please see the history of present illness.     All other systems reviewed and negative.    Physical Exam:  Blood pressure 140/72, pulse 90, height 5\' 8"  (1.727 m), weight 285 lb (129.3 kg), SpO2 98 %. General: Well developed, Morbidly obese female in no acute distress. Head: Normocephalic, atraumatic, sclera non-icteric, no xanthomas, nares are  without discharge. EENT: normal  Lymph Nodes:  none Neck: Negative for carotid bruits. JVD not elevated. Back:without scoliosis kyphosis Lungs: Clear bilaterally to auscultation without wheezes, rales, or rhonchi. Breathing is unlabored. Heart: RRR with S1 S2. No   murmur . No rubs, or gallops appreciated. Abdomen: Soft, non-tender, non-distended with normoactive bowel sounds. No hepatomegaly. No rebound/guarding. No obvious abdominal masses. Msk:  Strength and tone appear normal for age. Extremities: No clubbing or cyanosis. No  edema.  Distal pedal pulses are 2+ and equal bilaterally. Skin: Warm and Dry Neuro: Alert and oriented X 3. CN III-XII intact Grossly normal sensory and motor function . Psych:  Responds to questions appropriately with a normal affect.      Labs: Cardiac Enzymes No results for input(s): CKTOTAL, CKMB, TROPONINI in the last 72 hours. CBC Lab Results  Component Value Date   WBC 10.7 10/05/2019   HGB 13.3 10/05/2019   HCT 38.0 10/05/2019   MCV 81 10/05/2019   PLT 360 10/05/2019   PROTIME: No results for input(s): LABPROT, INR in the last 72 hours. Chemistry No results for input(s): NA, K, CL, CO2, BUN, CREATININE, CALCIUM, PROT, BILITOT, ALKPHOS, ALT, AST, GLUCOSE in the last 168 hours.  Invalid input(s): LABALBU Lipids No results found for: CHOL, HDL, LDLCALC, TRIG BNP No results found for: PROBNP Thyroid Function Tests: No results for input(s): TSH, T4TOTAL,  T3FREE, THYROIDAB in the last 72 hours.  Invalid input(s): FREET3 Miscellaneous Lab Results  Component Value Date   DDIMER 0.33 12/25/2018    Radiology/Studies:  No results found.  EKG:     Assessment and Plan:  Hypertension  Morbid obesity  Exertional tachypalpitations  GI symptoms question irritable bowel    The patient has symptoms suggestive of autonomic insufficiency with heat intolerance, menses intolerance some orthostatic intolerance.  She does not fit diagnostic criteria; it is possible that her beta-blocker therapy is masking orthostatic tachycardia.  In the event that that is a component of her symptoms, we have reviewed the importance of fluids, salt is precluded because of her high blood pressure, and so external compression by way of abdominal binders and thigh sleeves was recommended.  Would encourage her to follow-up with GI to address the irritable bowel issues as this is clearly contributing to her overall sense of wellbeing.  Furthermore, if her menses could be controlled this might help mitigate some of her symptoms as well.  I am struck by the management of her blood pressure.  I wonder about whether alternatives to beta-blockers should be used preferentially given the paucity of data for mortality benefit and endorgan protection from beta-blockers over the long-term as a primary therapy.  Furthermore, hypokalemia was demonstrated in 2013.  Given the difficulties of her blood pressure, I suggested that she get renin studies.  Encouraged weight loss and exercise discussed the role of recumbent exercise       Virl Axe

## 2019-12-06 NOTE — Patient Instructions (Signed)
Medication Instructions:  Your physician recommends that you continue on your current medications as directed. Please refer to the Current Medication list given to you today. *If you need a refill on your cardiac medications before your next appointment, please call your pharmacy*   Lab Work: None ordered.  If you have labs (blood work) drawn today and your tests are completely normal, you will receive your results only by: . MyChart Message (if you have MyChart) OR . A paper copy in the mail If you have any lab test that is abnormal or we need to change your treatment, we will call you to review the results.   Testing/Procedures: None ordered.    Follow-Up: At [facility], you and your health needs are our priority.  As part of our continuing mission to provide you with exceptional heart care, we have created designated Provider Care Teams.  These Care Teams include your primary Cardiologist (physician) and Advanced Practice Providers (APPs -  Physician Assistants and Nurse Practitioners) who all work together to provide you with the care you need, when you need it.  We recommend signing up for the patient portal called "MyChart".  Sign up information is provided on this After Visit Summary.  MyChart is used to connect with patients for Virtual Visits (Telemedicine).  Patients are able to view lab/test results, encounter notes, upcoming appointments, etc.  Non-urgent messages can be sent to your provider as well.   To learn more about what you can do with MyChart, go to [email or web address].    Your next appointment:   As needed with Dr Klein  

## 2019-12-08 ENCOUNTER — Ambulatory Visit: Payer: 59 | Admitting: Cardiology

## 2019-12-10 ENCOUNTER — Other Ambulatory Visit: Payer: Self-pay

## 2019-12-10 ENCOUNTER — Ambulatory Visit: Payer: 59

## 2019-12-10 DIAGNOSIS — R5381 Other malaise: Secondary | ICD-10-CM

## 2019-12-10 DIAGNOSIS — Z8616 Personal history of COVID-19: Secondary | ICD-10-CM

## 2019-12-12 NOTE — Therapy (Addendum)
[place], [place], [address] Phone: [phone]   Fax:  [phone]  Physical Therapy Treatment/Discharge Summary  Patient Details  Name: Nicole Mendoza MRN: [id number] Date of Birth: [birth year] Referring Provider (PT): Fenton Foy, NP   Encounter Date: 12/10/2019   PT End of Session - 12/12/19 1240    Visit Number 6    Date for PT Re-Evaluation 01/23/20    Authorization Type [employer]    PT Start Time 1050    PT Stop Time 1132    PT Time Calculation (min) 42 min    Activity Tolerance Patient tolerated treatment well    Behavior During Therapy [facility] for tasks assessed/performed           Past Medical History:  Diagnosis Date  . Allergic rhinitis   . Angio-edema   . Asthma   . Fatty liver   . GERD (gastroesophageal reflux disease)   . Hypertension   . Recurrent upper respiratory infection (URI)   . Tachycardia     History reviewed. No pertinent surgical history.  There were no vitals filed for this visit.   Subjective Assessment - 12/12/19 1227    Subjective Pt reports seeing the POTS specialist and the visit was inconclusive, but he recommended treating her as if she had POTS. Her medications are currently being adjusted which has caused her to have weaKness issues for a few days this week, but today is better. Pt reports she felt good about her tolerance when she went shopping at [facility].    Currently in Pain? No/denies    Pain Onset More than a month ago    Pain Frequency Intermittent                             [facility] Adult PT Treatment/Exercise - 12/12/19 0001      Transfers   Five time sit to stand comments  13.4      Exercises   Exercises Lumbar;Knee/Hip      Lumbar Exercises: Stretches   Active Hamstring Stretch Right;Left;1 rep;20 seconds    Piriformis Stretch Right;Left;2 reps;20 seconds    Figure 4 Stretch 1 rep;20 seconds;Seated;With  overpressure    Figure 4 Stretch Limitations L and R      Knee/Hip Exercises: Aerobic   Nustep L6: 15 mins; arms and legs; O2 sat 98%; HR 100                  PT Education - 12/12/19 1236    Education Details Reviewed HEP c concentration on stretching exs provided on the last appt. Also, discussed gradual progression of walking program per distance or time and strength per reps and sets.    Person(s) Educated Patient    Methods Explanation;Demonstration;Verbal cues    Comprehension Verbalized understanding;Returned demonstration            PT Short Term Goals - 12/05/19 1631      PT SHORT TERM GOAL #1   Title Pt will be Ind c a HEP to address endurance and strength. Met    Baseline No program    Status Achieved             PT Long Term Goals - 12/12/19 1246      PT LONG TERM GOAL #1   Title Pt will report an improvement in average pain to 0-4/10 with ADLs. Met-0-3 average pain range    Baseline  0-8/10    Status Achieved      PT LONG TERM GOAL #3   Title Pt will demonstrate an improved STS to 15 sec (a min MDC). Met- 13.4    Status Achieved      PT LONG TERM GOAL #4   Title Pt will be ind in a final HEP to address strength and endurance, Met    Status Achieved                 Plan - 12/12/19 1241    Clinical Impression Statement Pt/PT discussed course of PT. Pt stated she has met her goals as far as having improved endurance and a program she can utilize to continue to improve endurance and strength. With, pt's improvement and reported consistent completion of HEP, DC from PT is approproiate. Today, re-testing of 5X STS and pt's tolerance using the Nu-step were both improved.    Comorbidities Auto-immune issue to be determined, obesity, tachycardia, Hx of Covid-19, asthma    PT Treatment/Interventions ADLs/Self Care Home Management;Biofeedback;DME Instruction;Contrast Bath;Traction;Moist Heat;Iontophoresis 39m/ml Dexamethasone;Gait training;Stair  training;Functional mobility training;Therapeutic activities;Therapeutic exercise;Neuromuscular re-education;Energy conservation;Patient/family education;Cognitive remediation;Compression bandaging;Taping;Vasopneumatic Device    Consulted and Agree with Plan of Care Patient           Patient will benefit from skilled therapeutic intervention in order to improve the following deficits and impairments:  Cardiopulmonary status limiting activity, Decreased activity tolerance, Decreased mobility, Decreased knowledge of precautions, Decreased endurance, Decreased coordination, Decreased range of motion, Decreased strength, Increased edema, Difficulty walking, Pain, Obesity, Impaired vision/preception, Improper body mechanics  Visit Diagnosis: Physical deconditioning  History of COVID-19     Problem List Patient Active Problem List   Diagnosis Date Noted  . Body aches 10/05/2019  . Muscle pain 10/05/2019  . Physical deconditioning 10/05/2019  . Palpitations 09/02/2019  . Cardiac murmur 09/02/2019  . History of 2019 novel coronavirus disease (COVID-19) 09/02/2019  . History of COVID-19 09/01/2019  . Tachycardia 09/01/2019  . Salivary stone 09/01/2019  . Thrush 09/01/2019  . Fatigue 09/01/2019  . Asthma 09/01/2019  . Irregular bowel habits 07/29/2019  . 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 Seasonal and perennial allergic rhinitis 01/22/2019  . Morbid obesity with BMI of 45.0-49.9, adult ([facility] 10/28/2017  . Mild persistent asthma without complication 083/38/2505 . Essential hypertension 04/22/2017  . Dysmenorrhea 06/26/2016  . Menorrhagia with regular cycle 06/26/2016  . Gastroesophageal reflux disease without esophagitis 12/22/2015  . Current use of beta blocker 12/22/2015  . Asthma with acute exacerbation 05/09/2015  . Acute maxillary sinusitis 05/09/2015  . Hypertension 05/09/2015  . Allergic rhinitis 05/09/2015  . Iron deficiency anemia 11/02/2014  .  Mild intermittent asthma without complication 039/76/7341 . Morbid obesity due to excess calories ([place] 11/02/2014  . Vitamin D deficiency 11/02/2014  . Strain of back 03/25/2014    AGar PontoMS, PT 12/12/19 12:53 PM   PHYSICAL THERAPY DISCHARGE SUMMARY  Visits from Start of Care: 6  Current functional level related to goals / functional outcomes: See above   Remaining deficits: See above   Education / Equipment: HEP  Plan: Patient agrees to discharge.  Patient goals were partially met. Patient is being discharged due to meeting the stated rehab goals.  ?????       [place] [address]Phone: [phone]  Fax:  [phone] Name: MRenetta SumanMRN: [id number] of Birth: 1May 31, 1992

## 2019-12-17 ENCOUNTER — Ambulatory Visit: Payer: 59

## 2019-12-21 ENCOUNTER — Telehealth: Payer: Self-pay | Admitting: Family Medicine

## 2019-12-21 NOTE — Telephone Encounter (Signed)
Pt was contacted to reschedule 7/26 appt. Provider no longer available. No answer. VM left

## 2020-01-10 ENCOUNTER — Ambulatory Visit: Payer: 59

## 2020-01-10 ENCOUNTER — Ambulatory Visit: Payer: 59 | Admitting: Cardiology

## 2020-01-11 ENCOUNTER — Telehealth: Payer: Self-pay | Admitting: Family Medicine

## 2020-01-11 NOTE — Telephone Encounter (Signed)
Pt was contacted to reched 8/2 appt

## 2020-01-12 ENCOUNTER — Ambulatory Visit (INDEPENDENT_AMBULATORY_CARE_PROVIDER_SITE_OTHER): Payer: 59 | Admitting: Neurology

## 2020-01-12 ENCOUNTER — Encounter: Payer: Self-pay | Admitting: Neurology

## 2020-01-12 ENCOUNTER — Other Ambulatory Visit: Payer: Self-pay

## 2020-01-12 VITALS — BP 157/97 | HR 100 | Ht 68.0 in | Wt 288.0 lb

## 2020-01-12 DIAGNOSIS — Z6841 Body Mass Index (BMI) 40.0 and over, adult: Secondary | ICD-10-CM

## 2020-01-12 DIAGNOSIS — G4733 Obstructive sleep apnea (adult) (pediatric): Secondary | ICD-10-CM | POA: Diagnosis not present

## 2020-01-12 DIAGNOSIS — R432 Parageusia: Secondary | ICD-10-CM | POA: Diagnosis not present

## 2020-01-12 DIAGNOSIS — Z9989 Dependence on other enabling machines and devices: Secondary | ICD-10-CM

## 2020-01-12 DIAGNOSIS — R430 Anosmia: Secondary | ICD-10-CM

## 2020-01-12 MED ORDER — CARBAMAZEPINE 100 MG PO CHEW: 100.0000 mg | CHEWABLE_TABLET | Freq: Two times a day (BID) | ORAL | 2 refills | Status: DC

## 2020-01-12 NOTE — Patient Instructions (Addendum)
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    My goal is to have her smell every day of selection of sentences that are associated with sour taste, sweet taste, salty taste , bitter taste and hot- spicy taste.Siricha or another garlic-based spicy concoction that will trigger both olfactory and gustatory sensations, and she may go with honey or any kind of syrup for a sweet taste sensation but I like for her to have the honey containing a little bit of vanilla so that there is an additional olfactory component.   She may want to start with the smell component in the morning and then consume orally after she has smelled all 4 groups.  In order to reduce the olfactory normal sensation of smoking of burning food I will order a low-dose of Tegretol twice a day.  He had remarkable success in reducing the frequency of nausea in relation to olfactory hallucinations.    My Plan is to proceed with:  1) as above , set of 4 olfactory qualities in a form that can also be eaten ,/ingested.   2) tegretol/ CBZ at 100 mg bid po.  I warned about the interference with hormonal birth control- may need to use barrier contraception.   Will revisit after 3-6 month.  3) get the Covid vaccine 19.

## 2020-01-12 NOTE — Progress Notes (Signed)
SLEEP MEDICINE CLINIC    Provider:  Larey Seat, MD  Primary [place] [address] [place] [address]     Referring Provider: Rubie Maid, [place],  [place] [address]          Chief Complaint according to patient   Patient presents with:    . 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      HISTORY OF PRESENT ILLNESS:  Nicole Mendoza is a 29 y.o. year old White or Caucasian female patient seen here upon PCP  referral on 01/12/2020 from Nicole Arms, NP .  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 Rexene Alberts the same month she contracted Covid.  She became very short of breath and since she had an underlying condition of asthma struggled for over 10 weeks with significant respiratory difficulties, she remains extremely fatigued with myalgia, limited exercise tolerance and she was also evaluated for POTS.  Autoimmune disorders work-up was negative by rheumatology cardiology.  She reports ongoing smelling smoke and the character of her olfactory triggers a set of foot burning food or meat.  She also becomes very nauseated when she smells ciggarette smoke , also there is no source of the "burning" smells.    I have the pleasure of seeing [name], RN  today, a right-handed White or Caucasian female with a loss of taste and smell after COVID 19, she has a  has a past medical history of Allergic rhinitis, Angio-edema, Asthma, Fatty liver, GERD (gastroesophageal reflux disease), Hypertension, Recurrent upper respiratory infection (URI), and Tachycardia.   Dr. De Nurse tested the patient for sleep apnea the results were positive and she has been using CPAP over 11 months now.  She is compliant.     Review of Systems: Out of a complete 14 system review, the  patient complains of only the following symptoms, and all other reviewed systems are negative.:  Fatigue, sleepiness , snoring, fragmented sleep, Hypersomnia.   Dysosmia, anosmia, ageusia.  Mostly sour taste - sour pickles, olives etc are the most distorted taste sensations.  she recovered chocolate, mint and menthol. She is not sure about grapefruit taste .  She cannot like peppers anymore, banana peppers, green peppers,   Sweet works fine, taste is preserved. Salty taste is preserved.   Abnormal smell of smoke- nauseating- this can be treated with tegretol.    Social History   Socioeconomic History  . Marital status: Married    Spouse name: Not on file  . Number of children: 0  . Years of education: Not on file  . Highest education level: Not on file  Occupational History  . Not on file  Tobacco Use  . Smoking status: Never Smoker  . Smokeless tobacco: Never Used  Vaping Use  . Vaping Use: Never used  Substance and Sexual Activity  . Alcohol use: No    Alcohol/week: 0.0 standard drinks  . Drug use: No  . Sexual activity: Yes    Birth control/protection: Pill  Other Topics Concern  . Not on file  Social History Narrative  . Not on file   Social Determinants of Health   Financial Resource Strain:   . Difficulty of Paying Living Expenses:   Food Insecurity:   . Worried  About Running Out of Food in the Last Year:   . [place] in the Last Year:   Transportation Needs:   . Lack of Transportation (Medical):   Marland Kitchen Lack of Transportation (Non-Medical):   Physical Activity:   . Days of Exercise per Week:   . Minutes of Exercise per Session:   Stress:   . Feeling of Stress :   Social Connections:   . Frequency of Communication with Friends and Family:   . Frequency of Social Gatherings with Friends and Family:   . Attends Religious Services:   . Active Member of Clubs or Organizations:   . Attends Archivist Meetings:   Marland Kitchen Marital Status:      Family History  Problem Relation Age of Onset  . Allergic rhinitis Father   . Asthma Father   . Allergic rhinitis Sister   . Asthma Sister   . Angioedema Neg Hx   . Atopy Neg Hx   . Eczema Neg Hx   . Immunodeficiency Neg Hx   . Urticaria Neg Hx     Past Medical History:  Diagnosis Date  . Allergic rhinitis   . Angio-edema   . Asthma   . Fatty liver   . GERD (gastroesophageal reflux disease)   . Hypertension   . Recurrent upper respiratory infection (URI)   . Tachycardia     No past surgical history on file.   Current Outpatient Medications on File Prior to Visit  Medication Sig Dispense Refill  . Ascorbic Acid (SUPER C COMPLEX PO)     . cetirizine (ZYRTEC) 10 MG tablet Take by mouth.    . EPINEPHrine (AUVI-Q) 0.3 mg/0.3 mL IJ SOAJ injection Use as directed for severe allergic reactions 4 each 1  . famotidine (PEPCID) 20 MG tablet TAKE 1 TABLET BY MOUTH TWICE A DAY 60 tablet 5  . ferrous sulfate 325 (65 FE) MG tablet 3 (three) times a week.     . Fluticasone Propionate (XHANCE) 93 MCG/ACT EXHU Place 2 sprays into both nostrils 2 (two) times daily. 32 mL 5  . hyoscyamine (ANASPAZ) 0.125 MG TBDP disintergrating tablet Take 1 tablet by mouth as needed for irritation.    Marland Kitchen levalbuterol (XOPENEX HFA) 45 MCG/ACT inhaler Inhale 2 puffs into the lungs every 6 (six) hours as needed for wheezing. 1 Inhaler 1  . levalbuterol (XOPENEX) 1.25 MG/3ML nebulizer solution USE 1 VIAL BY NEBULIZATION EVERY 6 (SIX) HOURS AS NEEDED FOR WHEEZING. 90 mL 1  . metoprolol succinate (TOPROL-XL) 100 MG 24 hr tablet Take 100 mg by mouth daily. Take with or immediately following a meal.    . mometasone (ASMANEX, 30 METERED DOSES,) 220 MCG/INH inhaler Inhale 2 puffs into the lungs 2 (two) times daily. 1 Inhaler 5  . montelukast (SINGULAIR) 10 MG tablet Take 1 tablet (10 mg total) by mouth daily. 30 tablet 5  . norethindrone (ORTHO MICRONOR) 0.35 MG tablet Take 0.35 mg by mouth.    . ondansetron  (ZOFRAN) 4 MG tablet Take 4 mg by mouth every 8 (eight) hours as needed for nausea or vomiting.    . pantoprazole (PROTONIX) 40 MG tablet Take 40 mg by mouth daily.  3  . Probiotic Product ([place]) CAPS     . VITAMIN D, CHOLECALCIFEROL, PO Take by mouth.     No current facility-administered medications on file prior to visit.    Allergies  Allergen Reactions  . Budesonide-Formoterol Fumarate Other (See Comments)  Causes Hypertension  . Penicillins Hives  . Albuterol Palpitations  . Losartan Rash    Physical exam:  Today's Vitals   01/12/20 1401  BP: (!) 157/97  Pulse: 100  Weight: (!) 288 lb (130.6 kg)  Height: 5\' 8"  (1.727 m)   Body mass index is 43.79 kg/m.   Wt Readings from Last 3 Encounters:  01/12/20 (!) 288 lb (130.6 kg)  12/06/19 285 lb (129.3 kg)  11/04/19 290 lb (131.5 kg)     Ht Readings from Last 3 Encounters:  01/12/20 5\' 8"  (1.727 m)  12/06/19 5\' 8"  (1.727 m)  10/05/19 5\' 8"  (1.727 m)      General: The patient is awake, alert and appears not in acute distress. The patient is well groomed. Head: Normocephalic, atraumatic. Neck is supple. Cardiovascular:  Regular rate and cardiac rhythm by pulse,  without distended neck veins. Respiratory: Lungs are clear to auscultation.  Skin:  Without evidence of ankle edema, or rash. Trunk: The patient's posture is erect.   Neurologic exam : The patient is awake and alert, oriented to place and time.   Memory subjective described as intact.  Attention span & concentration ability appears normal.  Speech is fluent,  without  dysarthria, dysphonia or aphasia.  Mood and affect are appropriate.   Cranial nerves: there is loss of smell or taste reported  Pupils are equal and briskly reactive to light. Funduscopic exam deferred. .  Extraocular movements in vertical and horizontal planes were intact and without nystagmus. No Diplopia. Visual fields by finger perimetry are intact. Hearing was intact  to soft voice and finger rubbing.    Facial sensation intact to fine touch.  Facial motor strength is symmetric and tongue and uvula move midline.  Neck ROM : rotation, tilt and flexion extension were normal for age and shoulder shrug was symmetrical.    Motor exam:  Feeing sore- generalized pain- Symmetric bulk, tone and ROM.   Normal tone without cog wheeling, symmetric grip strength .   Sensory:  Fine touch, pinprick and vibration were tested  and  normal.  Proprioception tested in the upper extremities was normal.   Coordination: Rapid alternating movements in the fingers/hands were of normal speed.  The Finger-to-nose maneuver was intact without evidence of ataxia, dysmetria or tremor.   Gait and station: Patient could rise unassisted from a seated position, walked without assistive device. She had balance issues post Covid.  Stance is of normal width/ base and the patient turned with 3 steps.  Toe and heel walk were deferred.  Deep tendon reflexes: in the  upper and lower extremities are symmetric and intact.  Babinski response was deferred .     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  My goal is to have her smell every day of selection of sentences that are associated with sour taste, sweet taste, salty taste , bitter taste and hot- spicy taste.Siricha or another garlic-based spicy concoction that will trigger both olfactory and gustatory sensations, and she may go with honey or any kind of syrup for sweet taste  sensation but I like for her to have the  honey containing a little bit of  vanilla so that there is an additional olfactory component.   She may want to start with the smell component in the morning and then consume orally after she has smelled all 4 groups.  In order to reduce the olfactory normal sensation of smoking of burning food I will order a low-dose of Tegretol twice a day.  He had remarkable success in reducing the frequency of nausea in relation to olfactory hallucinations.    My Plan is to proceed with:  1) as above , set of 4 olfactory qualities in a form that can also be eaten ,/ingested.   2) tegretol/ CBZ at 100 mg bid po.  I warned about the interference with hormonal birth control- may need to use barrier contraception.   Will revisit after 3-6 month.  3) get the Covid vaccine 19.    I would like to thank  Rubie Maid, [place],  [place] [id number] for allowing me to meet with and to take care of this pleasant patient.   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 .   I plan to follow up either personally in 3-4 month.   CC: I will share my notes with PCP.   Electronically signed by: Larey Seat, MD 01/12/2020 2:24 PM  [facility] Board certified by The [employer] of Sleep Medicine and Diplomate of the [employer] of Sleep Medicine. Board certified In Neurology through the [place], Fellow of the [employer] of Neurology. Medical Director of [facility].

## 2020-01-13 ENCOUNTER — Encounter: Payer: Self-pay | Admitting: Neurology

## 2020-01-13 LAB — CBC WITH DIFFERENTIAL/PLATELET
Basophils Absolute: 0.1 10*3/uL (ref 0.0–0.2)
Basos: 1 %
EOS (ABSOLUTE): 0.2 10*3/uL (ref 0.0–0.4)
Eos: 2 %
Hematocrit: 40.9 % (ref 34.0–46.6)
Hemoglobin: 13.4 g/dL (ref 11.1–15.9)
Immature Grans (Abs): 0 10*3/uL (ref 0.0–0.1)
Immature Granulocytes: 0 %
Lymphocytes Absolute: 2.1 10*3/uL (ref 0.7–3.1)
Lymphs: 22 %
MCH: 27.5 pg (ref 26.6–33.0)
MCHC: 32.8 g/dL (ref 31.5–35.7)
MCV: 84 fL (ref 79–97)
Monocytes Absolute: 0.7 10*3/uL (ref 0.1–0.9)
Monocytes: 7 %
Neutrophils Absolute: 6.3 10*3/uL (ref 1.4–7.0)
Neutrophils: 68 %
Platelets: 348 10*3/uL (ref 150–450)
RBC: 4.88 x10E6/uL (ref 3.77–5.28)
RDW: 13.4 % (ref 11.7–15.4)
WBC: 9.3 10*3/uL (ref 3.4–10.8)

## 2020-01-13 LAB — COMPREHENSIVE METABOLIC PANEL
ALT: 31 IU/L (ref 0–32)
AST: 20 IU/L (ref 0–40)
Albumin/Globulin Ratio: 1.6 (ref 1.2–2.2)
Albumin: 4.5 g/dL (ref 3.9–5.0)
Alkaline Phosphatase: 87 IU/L (ref 48–121)
BUN/Creatinine Ratio: 16 (ref 9–23)
BUN: 13 mg/dL (ref 6–20)
Bilirubin Total: 0.3 mg/dL (ref 0.0–1.2)
CO2: 23 mmol/L (ref 20–29)
Calcium: 9.9 mg/dL (ref 8.7–10.2)
Chloride: 102 mmol/L (ref 96–106)
Creatinine, Ser: 0.83 mg/dL (ref 0.57–1.00)
GFR calc Af Amer: 111 mL/min/{1.73_m2} (ref >59)
GFR calc non Af Amer: 96 mL/min/{1.73_m2} (ref >59)
Globulin, Total: 2.9 g/dL (ref 1.5–4.5)
Glucose: 100 mg/dL — ABNORMAL HIGH (ref 65–99)
Potassium: 4.4 mmol/L (ref 3.5–5.2)
Sodium: 140 mmol/L (ref 134–144)
Total Protein: 7.4 g/dL (ref 6.0–8.5)

## 2020-01-13 NOTE — Progress Notes (Signed)
Normal CBC and metabolic panel.

## 2020-01-17 ENCOUNTER — Ambulatory Visit: Payer: 59

## 2020-01-19 ENCOUNTER — Ambulatory Visit: Payer: 59 | Admitting: Rheumatology

## 2020-01-21 ENCOUNTER — Other Ambulatory Visit: Payer: Self-pay | Admitting: Allergy & Immunology

## 2020-01-25 ENCOUNTER — Ambulatory Visit (INDEPENDENT_AMBULATORY_CARE_PROVIDER_SITE_OTHER): Payer: 59 | Admitting: Nurse Practitioner

## 2020-01-25 DIAGNOSIS — Z8616 Personal history of COVID-19: Secondary | ICD-10-CM | POA: Diagnosis not present

## 2020-01-25 NOTE — Progress Notes (Signed)
@Patient  ID: Nicole Mendoza, female    DOB: 1990-07-21, 29 y.o.   MRN: [id number]  Chief Complaint  Patient presents with  . Follow-up    feeling alot better. Recieved 1st dose [place] today    Referring provider: Rubie Maid, MD  29 year old female with hypertension, GERD asthma,sleep apnea (compliant with CPAP)and history of Covid(June 2020).  HPI  Patient presents today for post Covid care clinic follow-up visit.  Patient states that she is almost completely improved now.  She states that physical therapy made a significant improvement and she has completed her therapy now.   She did have an appointment with Dr. Caryl Comes to be evaluated for POTS.  She states that he advised for her to talk to her PCP about stopping HCTZ.  Her PCP agreed and did stop her HCTZ and patient states that she is completely better since stopping this medication.  She states that it was making her dehydrated.   She has met with Dr. Brett Fairy with neurology for olfactory dysfunction.  She has started treatment with essential oils for this.  If this does not improve her olfactory dysfunction she may trial Tegretol.  Denies f/c/s, n/v/d, hemoptysis, PND, chest pain or edema.       Allergies  Allergen Reactions  . Budesonide-Formoterol Fumarate Other (See Comments)    Causes Hypertension  . Penicillins Hives  . Albuterol Palpitations  . Losartan Rash    Immunization History  Administered Date(s) Administered  . Pneumococcal Polysaccharide-23 05/12/2018    Past Medical History:  Diagnosis Date  . Allergic rhinitis   . Angio-edema   . Asthma   . Fatty liver   . GERD (gastroesophageal reflux disease)   . Hypertension   . Recurrent upper respiratory infection (URI)   . Tachycardia     Tobacco History: Social History   Tobacco Use  Smoking Status Never Smoker  Smokeless Tobacco Never Used   Counseling given: Not Answered   Outpatient Encounter Medications as of 01/25/2020   Medication Sig  . Ascorbic Acid (SUPER C COMPLEX PO)   . carbamazepine (TEGRETOL) 100 MG chewable tablet Chew 1 tablet (100 mg total) by mouth 2 (two) times daily.  . cetirizine (ZYRTEC) 10 MG tablet Take by mouth.  . EPINEPHrine (AUVI-Q) 0.3 mg/0.3 mL IJ SOAJ injection Use as directed for severe allergic reactions  . famotidine (PEPCID) 20 MG tablet TAKE 1 TABLET BY MOUTH TWICE A DAY  . ferrous sulfate 325 (65 FE) MG tablet 3 (three) times a week.   . Fluticasone Propionate (XHANCE) 93 MCG/ACT EXHU Place 2 sprays into both nostrils 2 (two) times daily.  . hyoscyamine (ANASPAZ) 0.125 MG TBDP disintergrating tablet Take 1 tablet by mouth as needed for irritation.  Marland Kitchen levalbuterol (XOPENEX HFA) 45 MCG/ACT inhaler Inhale 2 puffs into the lungs every 6 (six) hours as needed for wheezing.  . levalbuterol (XOPENEX) 1.25 MG/3ML nebulizer solution USE 1 VIAL BY NEBULIZATION EVERY 6 (SIX) HOURS AS NEEDED FOR WHEEZING.  . metoprolol succinate (TOPROL-XL) 100 MG 24 hr tablet Take 100 mg by mouth daily. Take with or immediately following a meal.  . mometasone (ASMANEX, 30 METERED DOSES,) 220 MCG/INH inhaler Inhale 2 puffs into the lungs 2 (two) times daily.  . montelukast (SINGULAIR) 10 MG tablet Take 1 tablet (10 mg total) by mouth daily.  . norethindrone (ORTHO MICRONOR) 0.35 MG tablet Take 0.35 mg by mouth.  . ondansetron (ZOFRAN) 4 MG tablet Take 4 mg by mouth every 8 (  eight) hours as needed for nausea or vomiting.  . pantoprazole (PROTONIX) 40 MG tablet Take 40 mg by mouth daily.  . Probiotic Product ([place]) CAPS   . VITAMIN D, CHOLECALCIFEROL, PO Take by mouth.   No facility-administered encounter medications on file as of 01/25/2020.     Review of Systems  Review of Systems  Constitutional: Negative.  Negative for fatigue.  HENT: Negative.   Respiratory: Negative for cough, shortness of breath and wheezing.   Cardiovascular: Negative.  Negative for chest pain, palpitations  and leg swelling.  Gastrointestinal: Negative.   Allergic/Immunologic: Negative.   Neurological: Negative.   Psychiatric/Behavioral: Negative.        Physical Exam  BP 128/68 (BP Location: Left Arm, Patient Position: Sitting, Cuff Size: Large)   Pulse 85   Temp (!) 97.1 F (36.2 C)   Ht 5' 8"  (1.727 m)   Wt 290 lb (131.5 kg)   SpO2 99%   BMI 44.09 kg/m   Wt Readings from Last 5 Encounters:  01/25/20 290 lb (131.5 kg)  01/12/20 (!) 288 lb (130.6 kg)  12/06/19 285 lb (129.3 kg)  11/04/19 290 lb (131.5 kg)  10/05/19 288 lb 8 oz (130.9 kg)     Physical Exam Vitals and nursing note reviewed.  Constitutional:      General: She is not in acute distress.    Appearance: She is well-developed.  Cardiovascular:     Rate and Rhythm: Normal rate and regular rhythm.  Pulmonary:     Effort: Pulmonary effort is normal.     Breath sounds: Normal breath sounds.  Musculoskeletal:     Right lower leg: No edema.     Left lower leg: No edema.  Neurological:     Mental Status: She is alert and oriented to person, place, and time.  Psychiatric:        Mood and Affect: Mood normal.        Behavior: Behavior normal.       Assessment & Plan:   History of 2019 novel coronavirus disease (COVID-19) Fatigue Muscle pain:  Glad you are better!  Stay active  Stay well hydrated  Continue to work on healthy weight  Continue CPAP at night  Heart Murmur Heart palpitations:  Glad you are doing better!  Please keep follow up with cardiology  Brain Fog Olfactory difunction:  Follow up visit with Dr. Clarisa Fling out given on memory care  Follow up:  As needed         Fenton Foy, NP 01/25/2020

## 2020-01-25 NOTE — Patient Instructions (Signed)
History of 2019 novel coronavirus disease (COVID-19) Fatigue Muscle pain:  Glad you are better!  Stay active  Stay well hydrated  Continue to work on healthy weight  Continue CPAP at night  Heart Murmur Heart palpitations:  Glad you are doing better!  Please keep follow up with cardiology  Brain Fog Olfactory difunction:  Follow up visit with Dr. Clarisa Fling out given on memory care  Follow up:  As needed

## 2020-01-25 NOTE — Assessment & Plan Note (Signed)
Fatigue Muscle pain:  Glad you are better!  Stay active  Stay well hydrated  Continue to work on healthy weight  Continue CPAP at night  Heart Murmur Heart palpitations:  Glad you are doing better!  Please keep follow up with cardiology  Brain Fog Olfactory difunction:  Follow up visit with Dr. Clarisa Fling out given on memory care  Follow up:  As needed

## 2020-01-26 ENCOUNTER — Other Ambulatory Visit: Payer: Self-pay | Admitting: *Deleted

## 2020-01-26 MED ORDER — XHANCE 93 MCG/ACT NA EXHU: 2.0000 | INHALANT_SUSPENSION | Freq: Two times a day (BID) | NASAL | 5 refills | Status: DC

## 2020-01-26 NOTE — Telephone Encounter (Signed)
Received refill request for Nicole Mendoza- there is an allergy contraindication to budesonide-formoterol Fumarate. Please advise if this should be sent in.

## 2020-01-28 ENCOUNTER — Ambulatory Visit (INDEPENDENT_AMBULATORY_CARE_PROVIDER_SITE_OTHER): Payer: 59 | Admitting: Allergy & Immunology

## 2020-01-28 ENCOUNTER — Other Ambulatory Visit: Payer: Self-pay

## 2020-01-28 ENCOUNTER — Encounter: Payer: Self-pay | Admitting: Allergy & Immunology

## 2020-01-28 VITALS — BP 140/70 | HR 97 | Temp 97.9°F | Resp 18 | Wt 288.0 lb

## 2020-01-28 DIAGNOSIS — J3089 Other allergic rhinitis: Secondary | ICD-10-CM

## 2020-01-28 DIAGNOSIS — T7800XD Anaphylactic reaction due to unspecified food, subsequent encounter: Secondary | ICD-10-CM | POA: Diagnosis not present

## 2020-01-28 DIAGNOSIS — J4540 Moderate persistent asthma, uncomplicated: Secondary | ICD-10-CM

## 2020-01-28 DIAGNOSIS — K219 Gastro-esophageal reflux disease without esophagitis: Secondary | ICD-10-CM

## 2020-01-28 DIAGNOSIS — J302 Other seasonal allergic rhinitis: Secondary | ICD-10-CM

## 2020-01-28 NOTE — Patient Instructions (Addendum)
1. Possible vaccine reaction - Spirometry looks beautiful.  - Start Symbicort 80/4.45mcg two puffs twice daily for two weeks to help with the chest tightness. - Get the second vaccine. - Email me updates.  2. Follow up as scheduled in two weeks.    Please inform us of any Emergency Department visits, hospitalizations, or changes in symptoms. Call us before going to the ED for breathing or allergy symptoms since we might be able to fit you in for a sick visit. Feel free to contact us anytime with any questions, problems, or concerns.  It was a pleasure to see you again today!  Websites that have reliable patient information: 1. American Academy of Asthma, Allergy, and Immunology: www.aaaai.org 2. Food Allergy Research and Education (FARE): foodallergy.org 3. Mothers of Asthmatics: http://www.asthmacommunitynetwork.org 4. American College of Allergy, Asthma, and Immunology: www.acaai.org   COVID-19 Vaccine Information can be found at: ShippingScam.co.uk For questions related to vaccine distribution or appointments, please email vaccine@[place] .com or call 7044137134.     Like [place] on [place] and Instagram for our latest updates!        Make sure you are registered to vote! If you have moved or changed any of your contact information, you will need to get this updated before voting!  In some cases, you MAY be able to register to vote online: [email or web address]

## 2020-01-28 NOTE — Progress Notes (Signed)
FOLLOW UP  Date of Service/Encounter:  01/28/20   Assessment:   Adverse reaction to COVID19 vaccination - clearly not IgE mediated given the long duration of symptoms, possibly complicated by underlying anxiety  Mild persistent asthma - well controlled on the Asmanex (holding Fasenra)  Recurrent infections- improved following Pneumovax (protection to 3/23 serotypes initially to 16/23 serotypes post-really terrible vaccination of Pneumococcus)  Seasonal and perennial allergic rhinitis(trees, weeds, grasses and dust mites)-   Gastroesophageal reflux disease- onpantoprazo we can leand famotidineand PRN carafate   Adverse food reactions -resolvedwith minimizing exposure to food additives and avoiding peanuts/tree nuts  Snoring with perceived poor sleep quality-improved with use of the CPAP  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 Possible vaccine reaction - Spirometry looks perfect.  - Start Symbicort 80/4.61mcg two puffs twice daily for two weeks to help with the chest tightness. - Add on carafate religiously to see if anything is due to uncontrolled GERD - Get the second vaccine. - Email me updates.  2. Follow up as scheduled in two weeks.   Subjective:   Nicole Mendoza is a 29 y.o. female presenting today for follow up of  Chief Complaint  Patient presents with   Allergic Reaction    covid vaccine 2 days ago; sob, body aches    Nicole Mendoza has a history of the following: Patient Active Problem List   Diagnosis Date Noted   Body aches 10/05/2019   Muscle pain 10/05/2019   Physical deconditioning 10/05/2019   Palpitations 09/02/2019   Cardiac murmur 09/02/2019   History of 2019 novel coronavirus disease (COVID-19) 09/02/2019   History of COVID-19 09/01/2019   Tachycardia 09/01/2019   Salivary stone 09/01/2019   Thrush 09/01/2019   Fatigue 09/01/2019   Asthma 09/01/2019   Irregular bowel habits 07/29/2019   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   Seasonal and perennial allergic rhinitis 01/22/2019   Morbid obesity with BMI of 45.0-49.9, adult ([place]) 10/28/2017   Mild persistent asthma without complication 53/20/2334   Essential hypertension 04/22/2017   Dysmenorrhea 06/26/2016   Menorrhagia with regular cycle 06/26/2016   Gastroesophageal reflux disease without esophagitis 12/22/2015   Current use of beta blocker 12/22/2015   Asthma with acute exacerbation 05/09/2015   Acute maxillary sinusitis 05/09/2015   Hypertension 05/09/2015   Allergic rhinitis 05/09/2015   Iron deficiency anemia 11/02/2014   Mild intermittent asthma without complication 35/68/6168   Morbid obesity due to excess calories ([place]) 11/02/2014   Vitamin D deficiency 11/02/2014   Strain of back 03/25/2014    History obtained from: chart review and patient.  Nicole Mendoza is a 29 y.o. female presenting for a sick visit. She is well known to this practice and has a history of moderate persistent asthma (does not tolerate LABAs), allergic rhinitis, recurrent infections, food allergies, and long-hauler COVID symptoms. She was last seen via televisit in June 2021 at which time we treated her for sinusitis. We felt that this was allergic mediated and we started her on a lose dose burst of prednisone.   Today she presents for evaluation of an adverse reaction to the COVID19 vaccination. She reports that she got the shot on Tuesday and graduated from [facility] at the same time. She reports that she was having some uvula swelling over the next day or so. Then on Wednesday she was having more and more chest tightness. Nothing has really helped. All of the stress and changes.   She took the hydroxyzine last night.  This did not help the chest tightness, but it did help the uvula swelling. She did try albuterol and this is not really doing much. This does help transiently. She finished the prednisone at the end of last  week. CT sinuses all normal. She has never been diagnosed with migraines. Prednisone always helps.   She went to see Neurology and she is "smell therapy". She was going to be placed on Tegretol for the smoking scent that she is always experiencing. She has not started this yet.   She went to see Dr. Caryl Comes (Caridology) recently. Apparently she on metoprolol for tachycardia from the COVID infection. She was taken off her diuretic. Muscle weakness has improved since stopping the diuretic. She is unsure whether this is POTS or just residual from her COVID infection.   Otherwise, there have been no changes to her past medical history, surgical history, family history, or social history.    Review of Systems  Constitutional: Negative.  Negative for fever, malaise/fatigue and weight loss.  HENT: Positive for congestion and sinus pain. Negative for ear discharge and ear pain.   Eyes: Negative for pain, discharge and redness.  Respiratory: Positive for shortness of breath. Negative for cough, sputum production and wheezing.        Positive for flank pain bilaterally.   Cardiovascular: Negative.  Negative for chest pain and palpitations.  Gastrointestinal: Negative for abdominal pain, constipation, diarrhea, heartburn, nausea and vomiting.  Skin: Negative.  Negative for itching and rash.  Neurological: Negative for dizziness and headaches.  Endo/Heme/Allergies: Positive for environmental allergies. Does not bruise/bleed easily.       Objective:   Blood pressure 140/70, pulse 97, temperature 97.9 F (36.6 C), temperature source Temporal, resp. rate 18, weight 288 lb (130.6 kg), SpO2 99 %. Body mass index is 43.79 kg/m.   Physical Exam:  Physical Exam Constitutional:      Appearance: Normal appearance. She is well-developed and overweight.     Comments: Pleasant female. Talkative.   HENT:     Head: Normocephalic and atraumatic.     Right Ear: Tympanic membrane, ear canal and external ear  normal.     Left Ear: Tympanic membrane, ear canal and external ear normal.     Nose: No nasal deformity, septal deviation, mucosal edema or rhinorrhea.     Right Turbinates: Enlarged and swollen.     Left Turbinates: Enlarged and swollen.     Right Sinus: No maxillary sinus tenderness or frontal sinus tenderness.     Left Sinus: No maxillary sinus tenderness or frontal sinus tenderness.     Comments: Turbinates enlarged.     Mouth/Throat:     Mouth: Mucous membranes are not pale and not dry.     Pharynx: Uvula midline.  Eyes:     General: Allergic shiner present.        Right eye: No discharge.        Left eye: No discharge.     Conjunctiva/sclera: Conjunctivae normal.     Right eye: Right conjunctiva is not injected. No chemosis.    Left eye: Left conjunctiva is not injected. No chemosis.    Pupils: Pupils are equal, round, and reactive to light.  Cardiovascular:     Rate and Rhythm: Normal rate and regular rhythm.     Heart sounds: Normal heart sounds.  Pulmonary:     Effort: Pulmonary effort is normal. No tachypnea, accessory muscle usage or respiratory distress.     Breath sounds: Normal breath sounds.  No wheezing, rhonchi or rales.  Chest:     Chest wall: No tenderness.  Lymphadenopathy:     Cervical: No cervical adenopathy.  Skin:    Coloration: Skin is not pale.     Findings: No abrasion, erythema, petechiae or rash. Rash is not papular, urticarial or vesicular.     Comments: No eczematous or urticarial lesions noted.   Neurological:     Mental Status: She is alert.  Psychiatric:        Behavior: Behavior is cooperative.      Diagnostic studies:    Spirometry: results normal (FEV1: 3.12/86%, FVC: 3.83/89%, FEV1/FVC: 81%).    Spirometry consistent with normal pattern.   Allergy Studies: none     Salvatore Marvel, MD  All[facility] and [facility] [place] [facility] [place]

## 2020-02-01 ENCOUNTER — Encounter: Payer: Self-pay | Admitting: Allergy & Immunology

## 2020-02-08 ENCOUNTER — Encounter: Payer: Self-pay | Admitting: Allergy & Immunology

## 2020-02-08 ENCOUNTER — Ambulatory Visit (INDEPENDENT_AMBULATORY_CARE_PROVIDER_SITE_OTHER): Payer: 59 | Admitting: Allergy & Immunology

## 2020-02-08 ENCOUNTER — Other Ambulatory Visit: Payer: Self-pay

## 2020-02-08 VITALS — BP 136/78 | HR 93 | Temp 98.1°F | Resp 18 | Ht 68.0 in | Wt 280.0 lb

## 2020-02-08 DIAGNOSIS — T7800XD Anaphylactic reaction due to unspecified food, subsequent encounter: Secondary | ICD-10-CM | POA: Diagnosis not present

## 2020-02-08 DIAGNOSIS — R5383 Other fatigue: Secondary | ICD-10-CM | POA: Diagnosis not present

## 2020-02-08 DIAGNOSIS — J3089 Other allergic rhinitis: Secondary | ICD-10-CM | POA: Diagnosis not present

## 2020-02-08 DIAGNOSIS — J4540 Moderate persistent asthma, uncomplicated: Secondary | ICD-10-CM | POA: Diagnosis not present

## 2020-02-08 DIAGNOSIS — K219 Gastro-esophageal reflux disease without esophagitis: Secondary | ICD-10-CM

## 2020-02-08 DIAGNOSIS — J302 Other seasonal allergic rhinitis: Secondary | ICD-10-CM

## 2020-02-08 MED ORDER — HYDROXYZINE HCL 25 MG PO TABS: 25.0000 mg | ORAL_TABLET | Freq: Three times a day (TID) | ORAL | 1 refills | Status: DC | PRN

## 2020-02-08 MED ORDER — EPINEPHRINE 0.3 MG/0.3ML IJ SOAJ: INTRAMUSCULAR | 1 refills | Status: DC

## 2020-02-08 MED ORDER — DOXYCYCLINE HYCLATE 100 MG PO TBEC: 100.0000 mg | DELAYED_RELEASE_TABLET | Freq: Two times a day (BID) | ORAL | 0 refills | Status: AC

## 2020-02-08 NOTE — Patient Instructions (Addendum)
1. Seasonal and perennial allergic rhinitis (trees, weeds, grasses and dust mites) - with OME on the LEFT  - Continue with: Zyrtec (cetirizine) 10mg  tablet once daily, Singulair (montelukast) 10mg  daily + Xhance two sprays per nostril twice daily.   - Since we cannot do prednisone with your second COVID vaccine coming up, we are going to continue with the doxycycline for one more week (prescription sent in).  - Information on allergy shots provided.  - Consent signed (call us when you want to start).  - Prescription for hydroxyzine provided.  2. Fatigue - We are going to get some EBV titers just to screen for this. - I anticipate that you will be AT least IgG positive (meaning a past infection). - I will send you a MyChart message with the results.   3. Adverse food reaction (tree nuts, peanuts)  - Continue to avoid peanuts and tree nuts.  Wynona Luna is up to date.     4. Mild persistent asthma, uncomplicated - We are not going to make any medication changes at this time.  - Lung testing looked great last week so we did not do that today.  - Daily controller medication(s): Asmanex 283mcg two puffs twice daily + Singulair 10mg   - Prior to physical activity: ProAir 2 puffs 10-15 minutes before physical activity. - Rescue medications: ProAir 4 puffs every 4-6 hours as needed - Changes during respiratory infections or worsening symptoms: Increase Asmanex 253mcg to 4 puffs twice daily for ONE TO TWO WEEKS. - Asthma control goals:  * Full participation in all desired activities (may need albuterol before activity) * Albuterol use two time or less a week on average (not counting use with activity) * Cough interfering with sleep two time or less a month * Oral steroids no more than once a year * No hospitalizations  5. Return in about 4 months (around 06/09/2020). This can be an in-person, a virtual Webex or a telephone follow up visit.   Please inform us of any Emergency Department visits,  hospitalizations, or changes in symptoms. Call us before going to the ED for breathing or allergy symptoms since we might be able to fit you in for a sick visit. Feel free to contact us anytime with any questions, problems, or concerns.  It was a pleasure to see you again today!  Websites that have reliable patient information: 1. American Academy of Asthma, Allergy, and Immunology: www.aaaai.org 2. Food Allergy Research and Education (FARE): foodallergy.org 3. Mothers of Asthmatics: http://www.asthmacommunitynetwork.org 4. American College of Allergy, Asthma, and Immunology: www.acaai.org   COVID-19 Vaccine Information can be found at: ShippingScam.co.uk For questions related to vaccine distribution or appointments, please email [email or web address] or call [phone].     "Like" [place] on Facebook and Instagram for our latest updates!        Make sure you are registered to vote! If you have moved or changed any of your contact information, you will need to get this updated before voting!  In some cases, you MAY be able to register to vote online: [email or web address]

## 2020-02-08 NOTE — Progress Notes (Signed)
FOLLOW UP  Date of Service/Encounter:  02/08/20   Assessment:   Adverse reaction to COVID19 vaccination - clearly not IgE mediated given the long duration of symptoms, possibly complicated by underlying anxiety  Mild persistent asthma - well controlled on the Asmanex (holding Fasenra)  Recurrent infections- improved following Pneumovax (protection to 3/23 serotypes initially to 16/23 serotypes post-really terriblevaccination of Pneumococcus)  Seasonal and perennial allergic rhinitis(trees, weeds, grasses and dust mites)- not well controlled  Gastroesophageal reflux disease- onpantoprazowe can leand famotidineand PRN carafate  Adverse food reactions -resolvedwith minimizing exposure to food additives and avoiding peanuts/tree nuts  Snoring with perceived poor sleep quality-improved with use of the CPAP  Possible POTS syndrome  Plan/Recommendations:    1. Seasonal and perennial allergic rhinitis (trees, weeds, grasses and dust mites) - with OME on the LEFT  - Continue with: Zyrtec (cetirizine) 10mg  tablet once daily, Singulair (montelukast) 10mg  daily + Xhance two sprays per nostril twice daily.   - Since we cannot do prednisone with your second COVID vaccine coming up, we are going to continue with the doxycycline for one more week (prescription sent in).  - Information on allergy shots provided.  - Consent signed (call us when you want to start).  - Prescription for hydroxyzine provided.  2. Fatigue - We are going to get some EBV titers just to screen for this. - I anticipate that you will be AT least IgG positive (meaning a past infection). - I will send you a MyChart message with the results.   3. Adverse food reaction (tree nuts, peanuts)  - Continue to avoid peanuts and tree nuts.  Wynona Luna is up to date.     4. Mild persistent asthma, uncomplicated - We are not going to make any medication changes at this time.  - Lung testing looked great last  week so we did not do that today.  - Daily controller medication(s): Asmanex 253mcg two puffs twice daily + Singulair 10mg   - Prior to physical activity: ProAir 2 puffs 10-15 minutes before physical activity. - Rescue medications: ProAir 4 puffs every 4-6 hours as needed - Changes during respiratory infections or worsening symptoms: Increase Asmanex 276mcg to 4 puffs twice daily for ONE TO TWO WEEKS. - Asthma control goals:  * Full participation in all desired activities (may need albuterol before activity) * Albuterol use two time or less a week on average (not counting use with activity) * Cough interfering with sleep two time or less a month * Oral steroids no more than once a year * No hospitalizations  5. Return in about 4 months (around 06/09/2020). This can be an in-person, a virtual Webex or a telephone follow up visit.   Subjective:   Nicole Mendoza is a 29 y.o. female presenting today for follow up of  Chief Complaint  Patient presents with  . Asthma    Since her last visit she was told she has a ear infection. She is fatigue and not 100 percent herself. Asthma is good currently    Nicole Mendoza has a history of the following: Patient Active Problem List   Diagnosis Date Noted  . Body aches 10/05/2019  . Muscle pain 10/05/2019  . Physical deconditioning 10/05/2019  . Palpitations 09/02/2019  . Cardiac murmur 09/02/2019  . History of 2019 novel coronavirus disease (COVID-19) 09/02/2019  . History of COVID-19 09/01/2019  . Tachycardia 09/01/2019  . Salivary stone 09/01/2019  . Thrush 09/01/2019  . Fatigue 09/01/2019  . Asthma 09/01/2019  .  Irregular bowel habits 07/29/2019  . 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 Seasonal and perennial allergic rhinitis 01/22/2019  . Morbid obesity with BMI of 45.0-49.9, adult ([place]) 10/28/2017  . Mild persistent asthma without complication 15/40/0867  . Essential hypertension 04/22/2017  . Dysmenorrhea  06/26/2016  . Menorrhagia with regular cycle 06/26/2016  . Gastroesophageal reflux disease without esophagitis 12/22/2015  . Current use of beta blocker 12/22/2015  . Asthma with acute exacerbation 05/09/2015  . Acute maxillary sinusitis 05/09/2015  . Hypertension 05/09/2015  . Allergic rhinitis 05/09/2015  . Iron deficiency anemia 11/02/2014  . Mild intermittent asthma without complication 61/95/0932  . Morbid obesity due to excess calories ([place]) 11/02/2014  . Vitamin D deficiency 11/02/2014  . Strain of back 03/25/2014    History obtained from: chart review and patient.  Nicole Mendoza is a 29 y.o. female presenting for a follow up visit.  She was last seen on August 13 with concern for possible vaccine reaction.  We did a spirometry which was normal and started her on Symbicort 80/4.5 mcg 2 puffs twice daily.  We also added on Carafate just to see if this was related to uncontrolled reflux.  She was already on a PPI twice daily.  Since last visit, she has not done well. She was actually doing well for two months and was discharged from the [place] [facility] since she was doing so well. She is having trouble with doing laundry and getting tired out. She did get some information about reactivation of EBV after vaccination.   She was diagnosed with OME and was placed on doxycycline BID for one week for that. She has since completed the course. Dizziness did improve with that. But now the fatigue has been such an intense issue. She is not having any SOB. She did treat her GERD aggressively and she continued for two weeks with improvement in her symptoms.   She is still pending on the POTS workup. She was on a beta blocker which they felt might have altered the results. Even on a beta-blocker, her Holter monitor noted periods of tachycardia to the 180s.  Needless to say, this work-up is still ongoing.  Otherwise, there have been no changes to her past medical history, surgical history, family history, or  social history.    Review of Systems  Constitutional: Positive for malaise/fatigue. Negative for chills, diaphoresis, fever and weight loss.  HENT: Positive for congestion. Negative for ear discharge, ear pain and sinus pain.   Eyes: Negative for pain, discharge and redness.  Respiratory: Negative for cough, sputum production, shortness of breath and wheezing.   Cardiovascular: Negative.  Negative for chest pain and palpitations.  Gastrointestinal: Negative for abdominal pain, constipation, diarrhea, heartburn, nausea and vomiting.  Skin: Negative.  Negative for itching and rash.  Neurological: Negative for dizziness and headaches.  Endo/Heme/Allergies: Positive for environmental allergies. Does not bruise/bleed easily.       Objective:   Blood pressure 136/78, pulse 93, temperature 98.1 F (36.7 C), resp. rate 18, height 5\' 8"  (1.727 m), weight 280 lb (127 kg), SpO2 100 %. Body mass index is 42.57 kg/m.   Physical Exam:  Physical Exam Constitutional:      Appearance: She is well-developed.     Comments: Talkative as usual.  HENT:     Head: Normocephalic and atraumatic.     Right Ear: Tympanic membrane, ear canal and external ear normal.     Left Ear: External ear normal. A middle ear effusion  is present.     Ears:     Comments: Left canal erythematous.  There are some bubbles within the middle ear canal.    Nose: No nasal deformity, septal deviation, mucosal edema or rhinorrhea.     Right Turbinates: Enlarged, swollen and pale.     Left Turbinates: Enlarged, swollen and pale.     Right Sinus: No maxillary sinus tenderness or frontal sinus tenderness.     Left Sinus: No maxillary sinus tenderness or frontal sinus tenderness.     Comments: Clear mucus bilaterally.    Mouth/Throat:     Mouth: Mucous membranes are not pale and not dry.     Pharynx: Uvula midline.  Eyes:     General:        Right eye: No discharge.        Left eye: No discharge.     Conjunctiva/sclera:  Conjunctivae normal.     Right eye: Right conjunctiva is not injected. No chemosis.    Left eye: Left conjunctiva is not injected. No chemosis.    Pupils: Pupils are equal, round, and reactive to light.  Cardiovascular:     Rate and Rhythm: Normal rate and regular rhythm.     Heart sounds: Normal heart sounds.  Pulmonary:     Effort: Pulmonary effort is normal. No tachypnea, accessory muscle usage or respiratory distress.     Breath sounds: Normal breath sounds. No wheezing, rhonchi or rales.     Comments: Moving air well in all lung fields.  No increased work of breathing. Chest:     Chest wall: No tenderness.  Lymphadenopathy:     Cervical: No cervical adenopathy.  Skin:    Coloration: Skin is not pale.     Findings: No abrasion, erythema, petechiae or rash. Rash is not papular, urticarial or vesicular.  Neurological:     Mental Status: She is alert.  Psychiatric:        Behavior: Behavior is cooperative.      Diagnostic studies: labs sent instead       Salvatore Marvel, MD  Allergy and [place] [place] [facility] [place]

## 2020-02-10 ENCOUNTER — Ambulatory Visit: Payer: 59 | Admitting: Rheumatology

## 2020-02-10 LAB — EBV AB TO VIRAL CAPSID AG PNL, IGG+IGM
EBV VCA IgG: 408 U/mL — ABNORMAL HIGH (ref 0.0–17.9)
EBV VCA IgM: <36 U/mL (ref 0.0–35.9)

## 2020-02-11 ENCOUNTER — Telehealth: Payer: Self-pay | Admitting: Allergy & Immunology

## 2020-02-11 NOTE — Telephone Encounter (Signed)
Patient needs a prior authorization for her Asmanex. She said the pharmacy sent that over and she was checking to see if it was received. She is out and said if it is going to take more than 2 days, she wants to know if we have a sample she can pick up. She is closer to the [facility] [employer] office.

## 2020-02-11 NOTE — Telephone Encounter (Signed)
Patient informed of pending prior auth as well as no availability for samples.

## 2020-02-11 NOTE — Telephone Encounter (Signed)
Prior authorization submitted on covermymeds. 

## 2020-02-13 ENCOUNTER — Other Ambulatory Visit: Payer: Self-pay | Admitting: Allergy

## 2020-02-13 MED ORDER — PREDNISONE 10 MG PO TABS: ORAL_TABLET | ORAL | 0 refills | Status: AC

## 2020-02-13 NOTE — Progress Notes (Signed)
I reached out to the patient today and gave her the lab results, which showed a past EBV infection.  Agree with the prednisone burst.

## 2020-02-13 NOTE — Progress Notes (Signed)
Called by pt Sunday 8/29 around 1pm.  She states she saw Dr. Ernst Bowler in [place] on this past Tuesday.  Her doxycycline course was extended out.  She states she is miserable and symptoms are not improving.  She has been out of Asmanex for past week she reports awaiting insurance approval.  She states she is having chest tightness, clear nasal drainage so much that she reports her throat feels raw, states her nose is swollen and red that she can't put Xhance in the nose to administer like normal.  She states she has had neg Covid testing.  She does mention Dr. Ernst Bowler was holding off on prednisone as she is scheduled to have her second Covid vaccine this Tuesday.  She sounds audibly hoarse over the phone.  Talking in complete sentencing however clearing throat quite a bit.  I advised if she is still symptomatic on Tuesday they would not administer her vaccine anyway while she is sick.  Advised we go ahead with prednisone burst at this time.  She will continue her routine medications as advised as well as ocmplete antibiotic course.   I counseled that if she is 1-2 weeks late on getting second Covid vaccine that is ok and still within window.  She agreed with this plan.

## 2020-02-14 ENCOUNTER — Telehealth: Payer: Self-pay | Admitting: Allergy & Immunology

## 2020-02-14 MED ORDER — AZITHROMYCIN 250 MG PO TABS
ORAL_TABLET | ORAL | 0 refills | Status: DC
Start: 2020-02-14 — End: 2020-03-08

## 2020-02-14 NOTE — Telephone Encounter (Signed)
Patient called and states that she was directed to change her treatment due to issues she was having. Since patient has to wait 1-2 weeks before getting her second COVID vaccine, she would like to know if she can get the second shot in our office. Patient states that in 1-2 weeks where she would get the shot, they will not have any openings.  Received Pfizer vaccine with no reactions.  Please advise.

## 2020-02-14 NOTE — Telephone Encounter (Signed)
Please advise 

## 2020-02-14 NOTE — Telephone Encounter (Signed)
I guess that the only one on that list that she has not tried or failed is Qvar.  Let's do Qvar 80 mcg 2 puffs twice daily.  Salvatore Marvel, MD Allergy and [facility] [place]

## 2020-02-14 NOTE — Telephone Encounter (Signed)
Further recommendations?

## 2020-02-14 NOTE — Telephone Encounter (Signed)
I called the patient to see how she was doing.  She was still very reports, but feeling slightly better with the prednisone.  She is still endorsing some sinus congestion.  This is despite 2 rounds of doxycycline.  Therefore, I am going to send in a course of azithromycin as well.  She would like to go ahead and reschedule her second Covid vaccine with [place].  I did offer Monday, September 20.  She is getting, 9 AM to [facility] to get that done.  Salvatore Marvel, MD Allergy and [place] [facility] [place]

## 2020-02-14 NOTE — Telephone Encounter (Signed)
Prior auth denied. Alternatives given are Arnuity Ellipta, Flovent Diskus, Flovent HFA, Pulmicort Flexhaler, Qvar Redihaler. She has tried/failed Flovent HFA, Pulmicort Flexhaler and Arnuity.

## 2020-02-15 MED ORDER — QVAR REDIHALER 80 MCG/ACT IN AERB: INHALATION_SPRAY | RESPIRATORY_TRACT | 5 refills | Status: DC

## 2020-02-15 NOTE — Telephone Encounter (Signed)
Qvar sent to patient's pharmacy and patient was made aware.

## 2020-02-15 NOTE — Addendum Note (Signed)
Addended by: Farrel Demark R on: 02/15/2020 09:01 AM   Modules accepted: Orders

## 2020-02-16 ENCOUNTER — Other Ambulatory Visit: Payer: Self-pay

## 2020-02-16 ENCOUNTER — Ambulatory Visit (INDEPENDENT_AMBULATORY_CARE_PROVIDER_SITE_OTHER): Payer: 59 | Admitting: Allergy & Immunology

## 2020-02-16 ENCOUNTER — Encounter: Payer: Self-pay | Admitting: Allergy & Immunology

## 2020-02-16 DIAGNOSIS — T7800XD Anaphylactic reaction due to unspecified food, subsequent encounter: Secondary | ICD-10-CM | POA: Diagnosis not present

## 2020-02-16 DIAGNOSIS — J4540 Moderate persistent asthma, uncomplicated: Secondary | ICD-10-CM

## 2020-02-16 DIAGNOSIS — R5383 Other fatigue: Secondary | ICD-10-CM | POA: Diagnosis not present

## 2020-02-16 DIAGNOSIS — K219 Gastro-esophageal reflux disease without esophagitis: Secondary | ICD-10-CM

## 2020-02-16 DIAGNOSIS — R0602 Shortness of breath: Secondary | ICD-10-CM | POA: Diagnosis not present

## 2020-02-16 DIAGNOSIS — J302 Other seasonal allergic rhinitis: Secondary | ICD-10-CM

## 2020-02-16 DIAGNOSIS — J3089 Other allergic rhinitis: Secondary | ICD-10-CM

## 2020-02-16 MED ORDER — LEVALBUTEROL HCL 1.25 MG/3ML IN NEBU
INHALATION_SOLUTION | RESPIRATORY_TRACT | 1 refills | Status: DC
Start: 2020-02-16 — End: 2020-09-25

## 2020-02-16 MED ORDER — GUAIFENESIN-CODEINE 100-10 MG/5ML PO SOLN: 10.0000 mL | Freq: Three times a day (TID) | ORAL | 0 refills | Status: AC | PRN

## 2020-02-16 MED ORDER — LEVALBUTEROL TARTRATE 45 MCG/ACT IN AERO: 2.0000 | INHALATION_SPRAY | Freq: Four times a day (QID) | RESPIRATORY_TRACT | 2 refills | Status: DC | PRN

## 2020-02-16 NOTE — Progress Notes (Signed)
RE: Nicole Mendoza MRN: [id number] DOB: [birth year] Date of Telemedicine Visit: 02/16/2020  Referring provider: Rubie Maid, MD Primary care provider: Rubie Maid, MD  Chief Complaint: Cough (productive cough, clear phlegm. rapid covid test was negative this morning) and Sinusitis   Telemedicine Follow Up Visit via Telephone: I connected with Beza Steppe for a follow up on 02/16/20 by telephone and verified that I am speaking with the correct person using two identifiers.   I discussed the limitations, risks, security and privacy concerns of performing an evaluation and management service by telephone and the availability of in person appointments. I also discussed with the patient that there may be a patient responsible charge related to this service. The patient expressed understanding and agreed to proceed.  Patient is at home.  Provider is at the office.  Visit start time: 10:18 AM Visit end time: 10:38 AM Insurance consent/check in by: [facility] consent and medical assistant/nurse: [place]  History of Present Illness:  She is a 29 y.o. female, who is being followed for a multitude of atopic complaints. Her previous allergy office visit was in August 2021 with myself.  At that time, she was reporting some ear fullness as well as fatigue and some sinus congestion.  She did have otitis media with effusion on the left.  We continued Zyrtec and Singulair as well as the [facility].  We did not do prednisone because she was scheduled to have her second COVID-19 vaccine and we did not want to blunt that response.  We did send her home with doxycycline for 1 more week (she was already on doxycycline for an ear infection, prescribed by her ENT).  She did make the decision to start allergen immunotherapy.  We did obtain EBV titers because of the fatigue and these were positive only for IgG (past infection).  We did not do lung testing.  We continued with Asmanex 220 mcg 2 puffs twice  daily as well as Singulair 10 mg daily. Unfortunately, the Asmanex was no longer covered by her insurance company, so we changed to Qvar instead.  She had failed the other preferred medications on the list.  Since last visit, she did call the on-call physician over the weekend and was started on prednisone.  She reschedule her COVID-19 shot #2 with [place] on September 20.  I talked to her earlier this week and she was not getting too much better on the prednisone.  Therefore, we started her on a course of azithromycin as well.  Since all the symptoms have started, she has now had 2 rapid COVID-19 test which were negative.  She has finished 2 courses of doxycycline and is completing her course of azithromycin (last dose tomorrow).  She is also on a prednisone taper.  Nothing really seems to be working.  She reports a tightness and a tickle at the top of her chest.  She has no fever and has not had a fever during this entire time.  She has been taking around-the-clock Tylenol cold and sinus medication.  Of note, her foster child has RSV.   Otherwise, there have been no changes to her past medical history, surgical history, family history, or social history.  Assessment and Plan:  Nicole Mendoza is a 29 y.o. female with:  Adverse reaction to COVID19 vaccination - clearly not IgE mediated given the long duration of symptoms, possibly complicated by underlying anxiety   Prolonged viral illness - with minimal improvement with doxycycline, azithromycin, and prednisone  Mild persistent asthma -  well controlled on the Qvar (holding Fasenra)  Recurrent infections- improved following Pneumovax (protection to 3/23 serotypes initially to 16/23 serotypes post-really terriblevaccination of Pneumococcus)  Seasonal and perennial allergic rhinitis(trees, weeds, grasses and dust mites)- not well controlled  Gastroesophageal reflux disease- onpantoprazowe can leand famotidineand PRN carafate  Adverse food  reactions -resolvedwith minimizing exposure to food additives and avoiding peanuts/tree nuts  Snoring with perceived poor sleep quality-improved with use of the CPAP  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  She is not responding to any antibiotics or prednisone at all.  With the sick contact at home, I conjecture that she has just a prolonged viral illness.  We did get a chest x-ray out of an abundance of caution, which looked normal.  The official read is still pending.  However, the costophrenic angles were within normal limits and she did not have a spine sign.  The cardiac silhouette appeared normal, although I am not sure about the size.  We will follow up on the official read.  We are going to start her on a codeine-containing cough medicine to help her to get some rest.  Hopefully she will eventually start to turn the corner.  I did meet her in the parking lot to give her the prescription for the cough medicine and on exam her lungs sounded perfectly fine.  She did have some coarse upper airway noise throughout, but no wheezing or crackles that I could appreciate.  Diagnostics: None.  Medication List:  Current Outpatient Medications  Medication Sig Dispense Refill  . predniSONE (DELTASONE) 10 MG tablet Take 2 tablets (20 mg total) by mouth 2 (two) times daily with a meal for 2 days, THEN 1 tablet (10 mg total) 2 (two) times daily with a meal for 2 days, THEN 1 tablet (10 mg total) daily with breakfast for 1 day. 13 tablet 0  . Ascorbic Acid (SUPER C COMPLEX PO)     . azithromycin (ZITHROMAX Z-PAK) 250 MG tablet Take two tablets on the first day and then one tablet daily for four more days. 6 each 0  . beclomethasone (QVAR REDIHALER) 80 MCG/ACT inhaler Inhale 2 puffs into the lungs twice daily. Rinse, gargle and spit after use. 1 each 5  . carbamazepine (TEGRETOL) 100 MG chewable tablet Chew 1 tablet (100 mg total) by mouth 2 (two) times daily. (Patient not  taking: Reported on 02/08/2020) 60 tablet 2  . cetirizine (ZYRTEC) 10 MG tablet Take by mouth.    . EPINEPHrine (AUVI-Q) 0.3 mg/0.3 mL IJ SOAJ injection Use as directed for severe allergic reactions 4 each 1  . famotidine (PEPCID) 20 MG tablet TAKE 1 TABLET BY MOUTH TWICE A DAY 60 tablet 5  . ferrous sulfate 325 (65 FE) MG tablet 3 (three) times a week.     . Fluticasone Propionate (XHANCE) 93 MCG/ACT EXHU Place 2 sprays into both nostrils 2 (two) times daily. 32 mL 5  . hydrOXYzine (ATARAX/VISTARIL) 25 MG tablet Take 1 tablet (25 mg total) by mouth 3 (three) times daily as needed. 30 tablet 1  . hyoscyamine (ANASPAZ) 0.125 MG TBDP disintergrating tablet Take 1 tablet by mouth as needed for irritation.    Marland Kitchen levalbuterol (XOPENEX HFA) 45 MCG/ACT inhaler Inhale 2 puffs into the lungs every 6 (six) hours as needed for wheezing. 1 Inhaler 1  . levalbuterol (XOPENEX) 1.25 MG/3ML nebulizer solution USE 1 VIAL BY NEBULIZATION EVERY 6 (SIX) HOURS AS NEEDED FOR WHEEZING.  90 mL 1  . metoprolol succinate (TOPROL-XL) 100 MG 24 hr tablet Take 100 mg by mouth daily. Take with or immediately following a meal.    . mometasone (ASMANEX, 30 METERED DOSES,) 220 MCG/INH inhaler Inhale 2 puffs into the lungs 2 (two) times daily. 1 Inhaler 5  . montelukast (SINGULAIR) 10 MG tablet Take 1 tablet (10 mg total) by mouth daily. 30 tablet 5  . norethindrone (ORTHO MICRONOR) 0.35 MG tablet Take 0.35 mg by mouth.    . ondansetron (ZOFRAN) 4 MG tablet Take 4 mg by mouth every 8 (eight) hours as needed for nausea or vomiting.    . pantoprazole (PROTONIX) 40 MG tablet Take 40 mg by mouth daily.  3  . Probiotic Product ([place]) CAPS     . VITAMIN D, CHOLECALCIFEROL, PO Take by mouth.     No current facility-administered medications for this visit.   Allergies: Allergies  Allergen Reactions  . Budesonide-Formoterol Fumarate Other (See Comments)    Causes Hypertension  . Penicillins Hives  . Albuterol  Palpitations  . Losartan Rash   I reviewed her past medical history, social history, family history, and environmental history and no significant changes have been reported from previous visits.  Review of Systems  Constitutional: Negative for activity change and appetite change.  HENT: Positive for sinus pressure and sinus pain. Negative for congestion, postnasal drip, rhinorrhea and sore throat.   Eyes: Negative for pain, discharge, redness and itching.  Respiratory: Positive for choking and shortness of breath. Negative for wheezing and stridor.   Gastrointestinal: Negative for diarrhea, nausea and vomiting.  Musculoskeletal: Negative for arthralgias, joint swelling and myalgias.  Skin: Negative for rash.  Allergic/Immunologic: Negative for environmental allergies and food allergies.    Objective:  Physical exam not obtained as encounter was done via telephone.   Previous notes and tests were reviewed.  I discussed the assessment and treatment plan with the patient. The patient was provided an opportunity to ask questions and all were answered. The patient agreed with the plan and demonstrated an understanding of the instructions.   The patient was advised to call back or seek an in-person evaluation if the symptoms worsen or if the condition fails to improve as anticipated.  I provided 20 minutes of non-face-to-face time during this encounter.  It was my pleasure to participate in [place] care today. Please feel free to contact me with any questions or concerns.   Sincerely,  Valentina Shaggy, MD

## 2020-02-17 ENCOUNTER — Encounter: Payer: Self-pay | Admitting: Allergy & Immunology

## 2020-02-17 MED ORDER — PREDNISONE 10 MG PO TABS: ORAL_TABLET | ORAL | 0 refills | Status: DC

## 2020-02-17 NOTE — Addendum Note (Signed)
Addended by: Valentina Shaggy on: 02/17/2020 06:30 PM   Modules accepted: Orders

## 2020-03-02 ENCOUNTER — Telehealth: Payer: Self-pay | Admitting: Allergy & Immunology

## 2020-03-02 NOTE — Telephone Encounter (Signed)
Patient scheduled to start allergy injections on 10/19 in [facility] since she lives in [place].

## 2020-03-02 NOTE — Telephone Encounter (Signed)
I have submitted a new prior authorization request with the three medications that are in her chart that she has tried and failed. Awaiting approval/denial.

## 2020-03-02 NOTE — Telephone Encounter (Signed)
Patient contacted me to let me know that she needs a prior authorization out of her Asmanex.  She has now failed Qvar as well as multiple other inhaled corticosteroids.  I emailed the information to [facility] to start that process.  She is also interested in starting allergen immunotherapy.  She is going to do this at the end of October.  I will have Mel Almond reach out to schedule a start injection date.  Salvatore Marvel, MD Allergy and [place] [facility] [place]

## 2020-03-03 ENCOUNTER — Telehealth: Payer: Self-pay | Admitting: Allergy & Immunology

## 2020-03-03 NOTE — Telephone Encounter (Signed)
Dr. Ernst Bowler do you think we should reschedule her? Also, I am currently working on the pa

## 2020-03-03 NOTE — Telephone Encounter (Signed)
Patient states she finished prednisone and antibiotics last week and yesterday she started feeling bad again. Has post nasal drip, cough, tightness in chest. Patient would like to know if she should come in for her COVID vaccine on Monday with these symptoms.   Please advise.

## 2020-03-03 NOTE — Telephone Encounter (Signed)
Ok I reworded it. Sorry.  I would reschedule her. She needs to be off of steroids for at least 2 weeks, but more ideally a month before getting her vaccine. I would also tell her to go to ENT. I have thrown a couple antibiotics at her and it's not helping. We can send her to a different ENT if she wants. At this point, I think we need a culture from her sinuses.  Salvatore Marvel, MD Allergy and [place] [facility] [place]

## 2020-03-03 NOTE — Telephone Encounter (Signed)
Called and spoke with patient and advised. Patient verbalized understanding and will go back to her ENT for further evaluation. 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 She will wait until she is feeling better and has been off of steroids.

## 2020-03-03 NOTE — Telephone Encounter (Signed)
I think there was a little mix up with your dictating with your dragon. Please rephrase what exactly you would like for [place] to do for the patient. I see that we need to get her to reschedule for her COVID vaccine. What about the culture were you referring to?

## 2020-03-03 NOTE — Telephone Encounter (Signed)
I would reschedule her. She needs to be off of steroids for at least 2 weeks, but more ideally a month before getting her vaccine. I would also tell her to go to ENT. I thrown a couple antibiotics letter and it's not helping. We can send her to a different ENT if she wants. At this point, I think we need a culture from her sinuses.  Salvatore Marvel, MD Allergy and [place] [facility] [place]

## 2020-03-04 MED ORDER — BUDESONIDE 0.5 MG/2ML IN SUSP: 0.5000 mg | Freq: Three times a day (TID) | RESPIRATORY_TRACT | 1 refills | Status: DC

## 2020-03-04 MED ORDER — PREDNISONE 10 MG PO TABS: ORAL_TABLET | ORAL | 1 refills | Status: DC

## 2020-03-04 NOTE — Addendum Note (Signed)
Addended by: Valentina Shaggy on: 03/04/2020 09:57 AM   Modules accepted: Orders

## 2020-03-04 NOTE — Telephone Encounter (Signed)
Patient contacted me over the weekend. She was having worsening congestion that was extending into her lungs. She reported congestion in her sinuses as well as upper chest congestion with persistent cough. She has been using her Xopenex routinely which did allow her to sleep last night. She also remains on the Mucinex-DM around the clock. She denies loss of taste, fever, and known COVID exposures. She is partially vaccinated.  We want to avoid antibiotics so that ENT can get a culture of her sinuses to see what we are missing. Therefore, we are adding on Pulmicort 0.5mg  TID combined with Mucinex through the next 1-2 weeks. We are also sending in a prednisone burst in case this is needed in the long term.  This all seems to stem from the start of postnasal drip. We are optimistic that the allergy shots are going to help with this (we are panning to start later this year).   She has failed Fasenra in the past (with a post-injection course of sinusitis for one week or so, which was causing missed work days). We briefly discussed starting Dupixent based on a steroid sparing indication. She wants to try allergy shots first, however.   She is going to call over the weekend with any other concerns. ENT was closed on Friday. She is going to call first thing on Monday to try to get in.  Salvatore Marvel, MD Allergy and [place] [facility] [place]

## 2020-03-06 ENCOUNTER — Ambulatory Visit: Payer: 59

## 2020-03-06 NOTE — Telephone Encounter (Signed)
Dr. Ernst Bowler I am unsure what else the insurance company needs from [place] but the patient has tried and failed all necessary formulary alternatives. I faxed the paperwork to you in [facility].

## 2020-03-08 ENCOUNTER — Encounter: Payer: Self-pay | Admitting: Cardiology

## 2020-03-08 ENCOUNTER — Telehealth: Payer: Self-pay

## 2020-03-08 ENCOUNTER — Telehealth (INDEPENDENT_AMBULATORY_CARE_PROVIDER_SITE_OTHER): Payer: 59 | Admitting: Cardiology

## 2020-03-08 ENCOUNTER — Other Ambulatory Visit: Payer: Self-pay

## 2020-03-08 VITALS — BP 150/84 | HR 92 | Ht 68.0 in | Wt 280.0 lb

## 2020-03-08 DIAGNOSIS — I10 Essential (primary) hypertension: Secondary | ICD-10-CM | POA: Diagnosis not present

## 2020-03-08 DIAGNOSIS — R002 Palpitations: Secondary | ICD-10-CM

## 2020-03-08 NOTE — Progress Notes (Signed)
Virtual Visit via Telephone Note   This visit type was conducted due to national recommendations for restrictions regarding the COVID-19 Pandemic (e.g. social distancing) in an effort to limit this patient's exposure and mitigate transmission in our community.  Due to her co-morbid illnesses, this patient is at least at moderate risk for complications without adequate follow up.  This format is felt to be most appropriate for this patient at this time.  The patient did not have access to video technology/had technical difficulties with video requiring transitioning to audio format only (telephone).  All issues noted in this document were discussed and addressed.  No physical exam could be performed with this format.  Please refer to the patient's chart for her  consent to telehealth for [facility].    Date:  03/08/2020   ID:  Nicole Mendoza, DOB [birth year], MRN [id number] The patient was identified using 2 identifiers.  Patient Location: Home Provider Location: Office/Clinic  PCP:  Rubie Maid, MD  Cardiologist:  No primary care provider on file.  Electrophysiologist:  None   Evaluation Performed:  Follow-Up Visit  Chief Complaint: Palpitations and elevated blood pressure  History of Present Illness:    Nicole Mendoza is a 29 y.o. female with past medical history of essential hypertension and palpitations.  She is morbidly obese.  She mentions to me that she has had upper respiratory tract infection and is on steroids and is getting better.  She is also on a course of antibiotics.  No chest pain orthopnea or PND.  At the time of my evaluation, the patient is alert awake oriented and in no distress.  The patient does not have symptoms concerning for COVID-19 infection (fever, chills, cough, or new shortness of breath).    Past Medical History:  Diagnosis Date  . Allergic rhinitis   . Angio-edema   . Asthma   . Fatty liver   . GERD (gastroesophageal reflux disease)   .  Hypertension   . Recurrent upper respiratory infection (URI)   . Tachycardia    Past Surgical History:  Procedure Laterality Date  . NO PAST SURGERIES       Current Meds  Medication Sig  . Ascorbic Acid (SUPER C COMPLEX PO)   . beclomethasone (QVAR REDIHALER) 80 MCG/ACT inhaler Inhale 2 puffs into the lungs twice daily. Rinse, gargle and spit after use.  . budesonide (PULMICORT) 0.5 MG/2ML nebulizer solution Take 2 mLs (0.5 mg total) by nebulization in the morning, at noon, and at bedtime.  . cefdinir (OMNICEF) 300 MG capsule Take 300 mg by mouth 2 (two) times daily.  . cetirizine (ZYRTEC) 10 MG tablet Take by mouth.  . EPINEPHrine (AUVI-Q) 0.3 mg/0.3 mL IJ SOAJ injection Use as directed for severe allergic reactions  . famotidine (PEPCID) 20 MG tablet TAKE 1 TABLET BY MOUTH TWICE A DAY  . ferrous sulfate 325 (65 FE) MG tablet 3 (three) times a week.   . Fluticasone Propionate (XHANCE) 93 MCG/ACT EXHU Place 2 sprays into both nostrils 2 (two) times daily.  . hydrOXYzine (ATARAX/VISTARIL) 25 MG tablet Take 1 tablet (25 mg total) by mouth 3 (three) times daily as needed.  . hyoscyamine (ANASPAZ) 0.125 MG TBDP disintergrating tablet Take 1 tablet by mouth as needed for irritation.  Marland Kitchen levalbuterol (XOPENEX HFA) 45 MCG/ACT inhaler Inhale 2 puffs into the lungs every 6 (six) hours as needed for wheezing.  . levalbuterol (XOPENEX) 1.25 MG/3ML nebulizer solution USE 1 VIAL BY NEBULIZATION EVERY 6 (SIX) HOURS AS  NEEDED FOR WHEEZING.  . metoprolol succinate (TOPROL-XL) 100 MG 24 hr tablet Take 100 mg by mouth daily. Take with or immediately following a meal.  . montelukast (SINGULAIR) 10 MG tablet Take 1 tablet (10 mg total) by mouth daily.  . norethindrone (ORTHO MICRONOR) 0.35 MG tablet Take 0.35 mg by mouth.  . ondansetron (ZOFRAN) 4 MG tablet Take 4 mg by mouth every 8 (eight) hours as needed for nausea or vomiting.  . pantoprazole (PROTONIX) 40 MG tablet Take 40 mg by mouth daily.  .  predniSONE (DELTASONE) 10 MG tablet Take two tablets (20mg ) twice daily for three days, then one tablet (10mg ) twice daily for three days, then STOP.  . Probiotic Product ([place]) CAPS   . VITAMIN D, CHOLECALCIFEROL, PO Take by mouth.     Allergies:   Budesonide-formoterol fumarate, Penicillins, Albuterol, and Losartan   Social History   Tobacco Use  . Smoking status: Never Smoker  . Smokeless tobacco: Never Used  Vaping Use  . Vaping Use: Never used  Substance Use Topics  . Alcohol use: No    Alcohol/week: 0.0 standard drinks  . Drug use: No     Family Hx: The patient's family history includes Allergic rhinitis in her father and sister; Asthma in her father and sister. There is no history of Angioedema, Atopy, Eczema, Immunodeficiency, or Urticaria.  ROS:   Please see the history of present illness.     All other systems reviewed and are negative.   Prior CV studies:   The following studies were reviewed today:  EVENT MONITOR REPORT:   Patient was monitored from 09/02/2019 to 09/16/2019. Indication:                    Palpitations Ordering physician:  Jenean Lindau, MD  Referring physician:        Jenean Lindau, MD    Baseline rhythm: Sinus  Minimum heart rate: 56 BPM.  Average heart rate: 93 BPM.  Maximal heart rate 180 BPM.  Atrial arrhythmia: Sinus rhythm with no significant arrhythmias  Ventricular arrhythmia: None significant  Conduction abnormality: None significant  Symptoms: None significant   Conclusion:  Normal event monitor  Interpreting  cardiologist: Jenean Lindau, MD  Date: 09/30/2019 9:38 AM   IMPRESSIONS    1. Left ventricular ejection fraction, by estimation, is 60 to 65%. The  left ventricle has normal function. The left ventricle has no regional  wall motion abnormalities. Left ventricular diastolic parameters were  normal.  2. Right ventricular systolic function is normal. The right ventricular    size is normal.  3. The mitral valve is normal in structure. Trivial mitral valve  regurgitation. No evidence of mitral stenosis.  4. The aortic valve was not well visualized. Aortic valve regurgitation  is not visualized. No aortic stenosis is present.  5. The inferior vena cava is normal in size with greater than 50%  respiratory variability, suggesting right atrial pressure of 3 mmHg.    Labs/Other Tests and Data Reviewed:    EKG:  EKG from previous visit was reviewed.  Recent Labs: 09/01/2019: TSH 3.860 10/05/2019: Magnesium 2.0 01/12/2020: ALT 31; BUN 13; Creatinine, Ser 0.83; Hemoglobin 13.4; Platelets 348; Potassium 4.4; Sodium 140   Recent Lipid Panel No results found for: CHOL, TRIG, HDL, CHOLHDL, LDLCALC, LDLDIRECT  Wt Readings from Last 3 Encounters:  03/08/20 280 lb (127 kg)  02/08/20 280 lb (127 kg)  01/28/20 288 lb (130.6 kg)  Objective:    Vital Signs:  BP (!) 150/84   Pulse 92   Ht 5\' 8"  (1.727 m)   Wt 280 lb (127 kg)   BMI 42.57 kg/m    VITAL SIGNS:  reviewed  ASSESSMENT & PLAN:    1. Primary prevention stressed with the patient.  Importance of compliance with diet medication stressed and she vocalized understanding. 2. Essential hypertension: Blood pressure stable and patient is doing well with lifestyle modification. 3. Palpitations: These have completely resolved and she is on beta-blocker. 4. Morbid obesity: Diet was emphasized.  Weight reduction was stressed and she promises to do better.  She plans to begin an exercise program after she is better with therapy for tract infection.  She already has a exercise bicycle and plans to meticulously follow exercise guidelines as I discussed with her today.  COVID-19 Education: The signs and symptoms of COVID-19 were discussed with the patient and how to seek care for testing (follow up with PCP or arrange E-visit).  The importance of social distancing was discussed today.  Time:   Today, I have  spent 15 minutes with the patient with telehealth technology discussing the above problems.     Medication Adjustments/Labs and Tests Ordered: Current medicines are reviewed at length with the patient today.  Concerns regarding medicines are outlined above.   Tests Ordered: No orders of the defined types were placed in this encounter.   Medication Changes: No orders of the defined types were placed in this encounter.   Follow Up:   6 mo  Signed, Jenean Lindau, MD  03/08/2020 11:21 AM    [place]

## 2020-03-08 NOTE — Telephone Encounter (Signed)
°  Patient Consent for Virtual Visit         Nicole Mendoza has provided verbal consent on 03/08/2020 for a virtual visit (video or telephone).   CONSENT FOR VIRTUAL VISIT FOR:  Nicole Mendoza  By participating in this virtual visit I agree to the following:  I hereby voluntarily request, consent and authorize [facility] and its employed or contracted physicians, physician assistants, nurse practitioners or other licensed health care professionals (the Practitioner), to provide me with telemedicine health care services (the Services") as deemed necessary by the treating Practitioner. I acknowledge and consent to receive the Services by the Practitioner via telemedicine. I understand that the telemedicine visit will involve communicating with the Practitioner through live audiovisual communication technology and the disclosure of certain medical information by electronic transmission. I acknowledge that I have been given the opportunity to request an in-person assessment or other available alternative prior to the telemedicine visit and am voluntarily participating in the telemedicine visit.  I understand that I have the right to withhold or withdraw my consent to the use of telemedicine in the course of my care at any time, without affecting my right to future care or treatment, and that the Practitioner or I may terminate the telemedicine visit at any time. I understand that I have the right to inspect all information obtained and/or recorded in the course of the telemedicine visit and may receive copies of available information for a reasonable fee.  I understand that some of the potential risks of receiving the Services via telemedicine include:   Delay or interruption in medical evaluation due to technological equipment failure or disruption;  Information transmitted may not be sufficient (e.g. poor resolution of images) to allow for appropriate medical decision making by the Practitioner; and/or     In rare instances, security protocols could fail, causing a breach of personal health information.  Furthermore, I acknowledge that it is my responsibility to provide information about my medical history, conditions and care that is complete and accurate to the best of my ability. I acknowledge that Practitioner's advice, recommendations, and/or decision may be based on factors not within their control, such as incomplete or inaccurate data provided by me or distortions of diagnostic images or specimens that may result from electronic transmissions. I understand that the practice of medicine is not an exact science and that Practitioner makes no warranties or guarantees regarding treatment outcomes. I acknowledge that a copy of this consent can be made available to me via my patient portal ([facility]), or I can request a printed copy by calling the office of [facility].    I understand that my insurance will be billed for this visit.   I have read or had this consent read to me.  I understand the contents of this consent, which adequately explains the benefits and risks of the Services being provided via telemedicine.   I have been provided ample opportunity to ask questions regarding this consent and the Services and have had my questions answered to my satisfaction.  I give my informed consent for the services to be provided through the use of telemedicine in my medical care

## 2020-03-10 ENCOUNTER — Other Ambulatory Visit: Payer: Self-pay

## 2020-03-10 ENCOUNTER — Encounter: Payer: Self-pay | Admitting: Allergy & Immunology

## 2020-03-10 ENCOUNTER — Ambulatory Visit (INDEPENDENT_AMBULATORY_CARE_PROVIDER_SITE_OTHER): Payer: 59 | Admitting: Allergy & Immunology

## 2020-03-10 VITALS — BP 150/80 | HR 98 | Temp 97.6°F | Resp 18 | Ht 68.0 in

## 2020-03-10 DIAGNOSIS — B999 Unspecified infectious disease: Secondary | ICD-10-CM

## 2020-03-10 DIAGNOSIS — J4540 Moderate persistent asthma, uncomplicated: Secondary | ICD-10-CM

## 2020-03-10 DIAGNOSIS — K219 Gastro-esophageal reflux disease without esophagitis: Secondary | ICD-10-CM | POA: Diagnosis not present

## 2020-03-10 DIAGNOSIS — J3089 Other allergic rhinitis: Secondary | ICD-10-CM

## 2020-03-10 DIAGNOSIS — J302 Other seasonal allergic rhinitis: Secondary | ICD-10-CM

## 2020-03-10 NOTE — Addendum Note (Signed)
Addended by: Valentina Shaggy on: 03/10/2020 06:01 AM   Modules accepted: Orders

## 2020-03-10 NOTE — Patient Instructions (Signed)
1. Lung testing looked awesome today. 2. We are going to work on getting a chest CT approved. 3. Bring up ciliary dyskinesia with ENT to see what they think of that. 4. Come by [facility] for the the Streptococcal avidity assay (I will make sure that is ordered).  5. Continue with the Pulmicort twice daily via nebulizer until we get the Asmanex approved.   We will be in touch!

## 2020-03-10 NOTE — Progress Notes (Signed)
FOLLOW UP  Date of Service/Encounter:  03/10/20   Assessment:   Adverse reaction to COVID19 vaccination - clearly not IgE mediated given the long duration of symptoms, possibly complicated by underlying anxiety   Prolonged viral illness - with minimal improvement with doxycycline, azithromycin, and prednisone  Mild persistent asthma - well controlled on the Qvar (holding Fasenra)  Recurrent infections- improved following Pneumovax (protection to 3/23 serotypes initially to 16/23 serotypes post-really terriblevaccination of Pneumococcus)  Seasonal and perennial allergic rhinitis(trees, weeds, grasses and dust mites)-not well controlled  Gastroesophageal reflux disease- onpantoprazowe can leand famotidineand PRN carafate  Adverse food reactions -resolvedwith minimizing exposure to food additives and avoiding peanuts/tree nuts  Snoring with perceived poor sleep quality-improved with use of the CPAP  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 Lung testing looked awesome today. 2. We are going to work on getting a chest CT approved, but will wait to see what ENT thinks.  3. Bring up ciliary dyskinesia with ENT to see what they think of that. 4. Come by [facility] for the the Streptococcal avidity assay (I will make sure that is ordered).  5. Continue with the Pulmicort twice daily via nebulizer until we get the Asmanex approved.    Subjective:   Nicole Mendoza is a 29 y.o. female presenting today for follow up of  Chief Complaint  Patient presents with  . Follow-up  . Shortness of Breath    no changes since last visit. does not want anymore steriods and feels the Qvar isn't helping    Nicole Mendoza has a history of the following: Patient Active Problem List   Diagnosis Date Noted  . Body aches 10/05/2019  . Muscle pain 10/05/2019  . Physical deconditioning 10/05/2019  . Palpitations 09/02/2019  . Cardiac murmur 09/02/2019  . History of  2019 novel coronavirus disease (COVID-19) 09/02/2019  . History of COVID-19 09/01/2019  . Tachycardia 09/01/2019  . Salivary stone 09/01/2019  . Thrush 09/01/2019  . Fatigue 09/01/2019  . Asthma 09/01/2019  . Irregular bowel habits 07/29/2019  . 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 Seasonal and perennial allergic rhinitis 01/22/2019  . Morbid obesity with BMI of 45.0-49.9, adult ([place]) 10/28/2017  . Mild persistent asthma without complication 59/93/5701  . Essential hypertension 04/22/2017  . Dysmenorrhea 06/26/2016  . Menorrhagia with regular cycle 06/26/2016  . Gastroesophageal reflux disease without esophagitis 12/22/2015  . Current use of beta blocker 12/22/2015  . Asthma with acute exacerbation 05/09/2015  . Acute maxillary sinusitis 05/09/2015  . Hypertension 05/09/2015  . Allergic rhinitis 11/02/2014  . Iron deficiency anemia 11/02/2014  . Mild intermittent asthma without complication 77/93/9030  . Morbid obesity due to excess calories ([place]) 11/02/2014  . Vitamin D deficiency 11/02/2014  . Strain of back 03/25/2014    History obtained from: chart review and patient.  Nicole Mendoza is a 29 y.o. female presenting for a follow up visit.  She is well-known to this practice and has a history of mild to moderate persistent asthma as well as seasonal and perennial allergic rhinitis, reflux, idiopathic anaphylaxis, and recurrent infections.  She also has as well as long COVID syndrome.  She is also in the midst of being worked up for POTS syndrome.    She was last seen on September 2021. At that time, she was continuing to have issues with shortness of breath and mucus production.  She had been on 2 rounds of prednisone and 2 rounds of antibiotics with minimal improvement.  I thought at the time that this was just a prolonged viral illness.  We did get a chest x-ray out of an abundance of caution which showed peribronchial thickening but was otherwise negative.  We  started her on a codeine-containing cough medicine.  Since the last visit, she is doing better with regards to postnasal drip. She is on the cefdinir. She took her last dose of prednisone this morning and is having more chest tightness.  She wanted to come in for a spirometry just so that she would know that her lungs were in good health. She has never had a chest CT. however the chest x-ray from earlier in September was normal.  She did start the Pulmicort two days ago. She cooked dinner last night and had energy to do that.  She did take a while to fill her Pulmicort, but when she did she is feeling better.  She still prefers the Asmanex and would like [place] to write a note to her insurance company to get this approved.  She has made an appointment with otolaryngology and is scheduled to go on Monday, September 27.  She is open to doing additional immunology labs to see if she has a similar specific antibody deficiency as her sister.  We will plan to do a Streptococcal avidity assay.   Otherwise, there have been no changes to her past medical history, surgical history, family history, or social history.    Review of Systems  Constitutional: Negative.  Negative for chills, fever, malaise/fatigue and weight loss.  HENT: Positive for congestion and sinus pain. Negative for ear discharge and ear pain.   Eyes: Negative for pain, discharge and redness.  Respiratory: Positive for cough and shortness of breath. Negative for sputum production and wheezing.   Cardiovascular: Negative.  Negative for chest pain and palpitations.  Gastrointestinal: Negative for abdominal pain, constipation, diarrhea, heartburn, nausea and vomiting.  Skin: Negative.  Negative for itching and rash.  Neurological: Negative for dizziness and headaches.  Endo/Heme/Allergies: Positive for environmental allergies. Does not bruise/bleed easily.       Objective:   Blood pressure (!) 150/80, pulse 98, temperature 97.6 F (36.4  C), temperature source Temporal, resp. rate 18, height 5\' 8"  (1.727 m), last menstrual period 02/21/2020, SpO2 99 %. Body mass index is 42.57 kg/m.   Physical Exam:  Physical Exam Constitutional:      Appearance: She is well-developed.     Comments: She is able to carry on a conversation.  HENT:     Head: Normocephalic and atraumatic.     Right Ear: Tympanic membrane, ear canal and external ear normal.     Left Ear: Tympanic membrane, ear canal and external ear normal.     Nose: No nasal deformity, septal deviation, mucosal edema or rhinorrhea.     Right Turbinates: Enlarged and swollen.     Left Turbinates: Enlarged and swollen.     Right Sinus: No maxillary sinus tenderness or frontal sinus tenderness.     Left Sinus: No maxillary sinus tenderness or frontal sinus tenderness.     Comments: There is some clear mucus in the bilateral nares, but no purulence.  Minimal sinus tenderness present.    Mouth/Throat:     Mouth: Mucous membranes are not pale and not dry.     Pharynx: Uvula midline.  Eyes:     General:        Right eye: No discharge.        Left eye: No discharge.  Conjunctiva/sclera: Conjunctivae normal.     Right eye: Right conjunctiva is not injected. No chemosis.    Left eye: Left conjunctiva is not injected. No chemosis.    Pupils: Pupils are equal, round, and reactive to light.  Cardiovascular:     Rate and Rhythm: Normal rate and regular rhythm.     Heart sounds: Normal heart sounds.  Pulmonary:     Effort: Pulmonary effort is normal. No tachypnea, accessory muscle usage or respiratory distress.     Breath sounds: Normal breath sounds. No wheezing, rhonchi or rales.  Chest:     Chest wall: No tenderness.  Lymphadenopathy:     Cervical: No cervical adenopathy.  Skin:    Coloration: Skin is not pale.     Findings: No abrasion, erythema, petechiae or rash. Rash is not papular, urticarial or vesicular.  Neurological:     Mental Status: She is alert.   Psychiatric:        Behavior: Behavior is cooperative.      Diagnostic studies:    Spirometry: results normal (FEV1: 3.00/83%, FVC: 3.82/89%, FEV1/FVC: 79%).    Spirometry consistent with normal pattern.   Allergy Studies: none      Salvatore Marvel, MD  Allergy and [place] [facility] [place]

## 2020-03-11 ENCOUNTER — Other Ambulatory Visit: Payer: Self-pay | Admitting: Allergy & Immunology

## 2020-03-13 ENCOUNTER — Other Ambulatory Visit: Payer: Self-pay

## 2020-03-13 ENCOUNTER — Emergency Department (HOSPITAL_BASED_OUTPATIENT_CLINIC_OR_DEPARTMENT_OTHER)
Admission: EM | Admit: 2020-03-13 | Discharge: 2020-03-13 | Disposition: A | Discharge status: Home / Self Care | Payer: 59 | Attending: Emergency Medicine | Admitting: Emergency Medicine

## 2020-03-13 ENCOUNTER — Encounter (HOSPITAL_BASED_OUTPATIENT_CLINIC_OR_DEPARTMENT_OTHER): Payer: Self-pay | Admitting: *Deleted

## 2020-03-13 DIAGNOSIS — R252 Cramp and spasm: Secondary | ICD-10-CM | POA: Insufficient documentation

## 2020-03-13 DIAGNOSIS — Z8616 Personal history of COVID-19: Secondary | ICD-10-CM | POA: Insufficient documentation

## 2020-03-13 DIAGNOSIS — R05 Cough: Secondary | ICD-10-CM | POA: Diagnosis not present

## 2020-03-13 DIAGNOSIS — E860 Dehydration: Secondary | ICD-10-CM | POA: Insufficient documentation

## 2020-03-13 DIAGNOSIS — I10 Essential (primary) hypertension: Secondary | ICD-10-CM | POA: Diagnosis not present

## 2020-03-13 DIAGNOSIS — J302 Other seasonal allergic rhinitis: Secondary | ICD-10-CM

## 2020-03-13 DIAGNOSIS — J4520 Mild intermittent asthma, uncomplicated: Secondary | ICD-10-CM | POA: Diagnosis not present

## 2020-03-13 DIAGNOSIS — R42 Dizziness and giddiness: Secondary | ICD-10-CM

## 2020-03-13 DIAGNOSIS — R059 Cough, unspecified: Secondary | ICD-10-CM

## 2020-03-13 LAB — CBC WITH DIFFERENTIAL/PLATELET
Abs Immature Granulocytes: 0.07 10*3/uL (ref 0.00–0.07)
Basophils Absolute: 0.1 10*3/uL (ref 0.0–0.1)
Basophils Relative: 0 %
Eosinophils Absolute: 0.2 10*3/uL (ref 0.0–0.5)
Eosinophils Relative: 2 %
HCT: 42.7 % (ref 36.0–46.0)
Hemoglobin: 13.8 g/dL (ref 12.0–15.0)
Immature Granulocytes: 1 %
Lymphocytes Relative: 21 %
Lymphs Abs: 2.7 10*3/uL (ref 0.7–4.0)
MCH: 27.2 pg (ref 26.0–34.0)
MCHC: 32.3 g/dL (ref 30.0–36.0)
MCV: 84.2 fL (ref 80.0–100.0)
Monocytes Absolute: 0.8 10*3/uL (ref 0.1–1.0)
Monocytes Relative: 7 %
Neutro Abs: 8.7 10*3/uL — ABNORMAL HIGH (ref 1.7–7.7)
Neutrophils Relative %: 69 %
Platelets: 283 10*3/uL (ref 150–400)
RBC: 5.07 MIL/uL (ref 3.87–5.11)
RDW: 13.8 % (ref 11.5–15.5)
WBC: 12.5 10*3/uL — ABNORMAL HIGH (ref 4.0–10.5)
nRBC: 0 % (ref 0.0–0.2)

## 2020-03-13 LAB — COMPREHENSIVE METABOLIC PANEL
ALT: 28 U/L (ref 0–44)
AST: 18 U/L (ref 15–41)
Albumin: 3.9 g/dL (ref 3.5–5.0)
Alkaline Phosphatase: 70 U/L (ref 38–126)
Anion gap: 11 (ref 5–15)
BUN: 15 mg/dL (ref 6–20)
CO2: 25 mmol/L (ref 22–32)
Calcium: 9.2 mg/dL (ref 8.9–10.3)
Chloride: 101 mmol/L (ref 98–111)
Creatinine, Ser: 0.67 mg/dL (ref 0.44–1.00)
GFR calc Af Amer: >60 mL/min (ref >60)
GFR calc non Af Amer: >60 mL/min (ref >60)
Glucose, Bld: 84 mg/dL (ref 70–99)
Potassium: 4 mmol/L (ref 3.5–5.1)
Sodium: 137 mmol/L (ref 135–145)
Total Bilirubin: 0.5 mg/dL (ref 0.3–1.2)
Total Protein: 7.9 g/dL (ref 6.5–8.1)

## 2020-03-13 MED ORDER — SODIUM CHLORIDE 0.9 % IV BOLUS
1000.0000 mL | Freq: Once | INTRAVENOUS | Status: AC
  Administered 2020-03-13: 1000 mL via INTRAVENOUS

## 2020-03-13 NOTE — ED Notes (Signed)
Pt on monitor 

## 2020-03-13 NOTE — ED Triage Notes (Signed)
Muscle cramps for 2 days  with dizziness.  Post covid caller advised her to come to the ER for possible IVF.

## 2020-03-13 NOTE — ED Provider Notes (Signed)
Emergency Department Provider Note   I have reviewed the triage vital signs and the nursing notes.   HISTORY  Chief Complaint Dehydration   HPI Nicole Mendoza is a 29 y.o. female with PMH reviewed below presents to the ED with muscle cramps and lightheadedness over the past 2 days. Patient has had persistent URI type symptoms over the past several weeks. She has been on multiple courses of steroid and antibiotic. She is currently on cefdinir and has several more days left of this medication. Symptoms began as a earache and then progressed to more bronchitis type symptoms. She had run out of her asthma medications and after seeing her pulmonologist was restarted on Pulmicort and has been taking twice daily ipratropium nebs at home. Her cough symptoms are not significantly worsening but have been persistent. She has not had chest pain, palpitations, specific shortness of breath.  Patient questions if she may be dehydrated. She has had cramping in the legs along with spasming pains in her back. Last night, she felt very lightheaded and had to be assisted to the bathroom. She denies any vertigo symptoms. No unilateral weakness or numbness. Patient called her nurse hotline and was referred to the ED from there.    Past Medical History:  Diagnosis Date  . Allergic rhinitis   . Angio-edema   . Asthma   . Fatty liver   . GERD (gastroesophageal reflux disease)   . Hypertension   . Recurrent upper respiratory infection (URI)   . Tachycardia     Patient Active Problem List   Diagnosis Date Noted  . Body aches 10/05/2019  . Muscle pain 10/05/2019  . Physical deconditioning 10/05/2019  . Palpitations 09/02/2019  . Cardiac murmur 09/02/2019  . History of 2019 novel coronavirus disease (COVID-19) 09/02/2019  . History of COVID-19 09/01/2019  . Tachycardia 09/01/2019  . Salivary stone 09/01/2019  . Thrush 09/01/2019  . Fatigue 09/01/2019  . Asthma 09/01/2019  . Irregular bowel habits  07/29/2019  . 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 Seasonal and perennial allergic rhinitis 01/22/2019  . Morbid obesity with BMI of 45.0-49.9, adult ([place]) 10/28/2017  . Mild persistent asthma without complication 77/93/9030  . Essential hypertension 04/22/2017  . Dysmenorrhea 06/26/2016  . Menorrhagia with regular cycle 06/26/2016  . Gastroesophageal reflux disease without esophagitis 12/22/2015  . Current use of beta blocker 12/22/2015  . Asthma with acute exacerbation 05/09/2015  . Acute maxillary sinusitis 05/09/2015  . Hypertension 05/09/2015  . Allergic rhinitis 11/02/2014  . Iron deficiency anemia 11/02/2014  . Mild intermittent asthma without complication 03/09/3006  . Morbid obesity due to excess calories ([place]) 11/02/2014  . Vitamin D deficiency 11/02/2014  . Strain of back 03/25/2014    Past Surgical History:  Procedure Laterality Date  . NO PAST SURGERIES      Allergies Budesonide-formoterol fumarate, Penicillins, Albuterol, and Losartan  Family History  Problem Relation Age of Onset  . Allergic rhinitis Father   . Asthma Father   . Allergic rhinitis Sister   . Asthma Sister   . Angioedema Neg Hx   . Atopy Neg Hx   . Eczema Neg Hx   . Immunodeficiency Neg Hx   . Urticaria Neg Hx     Social History Social History   Tobacco Use  . Smoking status: Never Smoker  . Smokeless tobacco: Never Used  Vaping Use  . Vaping Use: Never used  Substance Use Topics  . Alcohol use: No    Alcohol/week:  0.0 standard drinks  . Drug use: No    Review of Systems  Constitutional: No fever/chills. Positive lightheadedness.  Eyes: No visual changes. ENT: No sore throat. Cardiovascular: Denies chest pain. Respiratory: Denies shortness of breath. Positive cough (persistent)  Gastrointestinal: No abdominal pain.  No nausea, no vomiting.  No diarrhea.  No constipation. Genitourinary: Negative for dysuria. Musculoskeletal: Negative for back  pain. Skin: Negative for rash. Neurological: Negative for headaches, focal weakness or numbness.  10-point ROS otherwise negative.  ____________________________________________   PHYSICAL EXAM:  VITAL SIGNS: ED Triage Vitals  Enc Vitals Group     BP 03/13/20 1046 (!) 154/108     Pulse --      Resp 03/13/20 1046 16     Temp 03/13/20 1046 98 F (36.7 C)     Temp Source 03/13/20 1046 Oral     SpO2 03/13/20 1046 98 %     Weight 03/13/20 1049 285 lb (129.3 kg)     Height 03/13/20 1049 5\' 8"  (1.727 m)   Constitutional: Alert and oriented. Well appearing and in no acute distress. Eyes: Conjunctivae are normal. Head: Atraumatic. Nose: No congestion/rhinnorhea. Mouth/Throat: Mucous membranes are moist.  Neck: No stridor.  Cardiovascular: Normal rate, regular rhythm. Good peripheral circulation. Grossly normal heart sounds.   Respiratory: Normal respiratory effort.  No retractions. Lungs CTAB. No wheezing. Good air-entry.  Gastrointestinal: No distention.  Musculoskeletal: No gross deformities of extremities. Neurologic:  Normal speech and language. Skin:  Skin is warm, dry and intact. No rash noted.   ____________________________________________   LABS (all labs ordered are listed, but only abnormal results are displayed)  Labs Reviewed  CBC WITH DIFFERENTIAL/PLATELET - Abnormal; Notable for the following components:      Result Value   WBC 12.5 (*)    Neutro Abs 8.7 (*)    All other components within normal limits  COMPREHENSIVE METABOLIC PANEL   ____________________________________________  EKG   EKG Interpretation  Date/Time:  Monday March 13 2020 12:01:22 EDT Ventricular Rate:  83 PR Interval:    QRS Duration: 89 QT Interval:  373 QTC Calculation: 439 R Axis:   87 Text Interpretation: Sinus rhythm Borderline short PR interval No STEMI Confirmed by Nanda Quinton [phone]) on 03/13/2020 12:05:44 PM        ____________________________________________  RADIOLOGY  None  ____________________________________________   PROCEDURES  Procedure(s) performed:   Procedures  None  ____________________________________________   INITIAL IMPRESSION / ASSESSMENT AND PLAN / ED COURSE  Pertinent labs & imaging results that were available during my care of the patient were reviewed by me and considered in my medical decision making (see chart for details).   Patient presents to the emergency department with persistent cough but new lightheadedness and cramping in the muscles. Suspect dehydration clinically. Patient has been on multiple courses of steroid and antibiotic recently. Will obtain screening blood work to evaluate kidney function and to assess for possible electrolyte derangement. She has normal vital signs here including oxygen saturation. Her lungs are clear. Cough is not worsening. She is not experiencing chest pain or shortness of breath. She had a recent chest x-ray with her PCP. I do not plan to repeat a chest x-ray on an emergent basis. I will obtain an EKG to assess for underlying arrhythmia but low suspicion for this overall. Doubt acute COVID. Patient has been vaccinated and had [place] in 2020. She follows with the [place] currently. Exam and presentation not consistent with asthma exacerbation.   01:50 PM  Patient lab work is reassuring.  Potassium and other electrolytes are normal.  Kidney function normal.  Patient feeling improved after IV fluids.  Plan for continued PO hydration at home along with close PCP follow-up.  Patient to continue home prescription medications including antibiotics prescribed previously.  Discussed ED return precautions in detail.  ____________________________________________  FINAL CLINICAL IMPRESSION(S) / ED DIAGNOSES  Final diagnoses:  Episodic lightheadedness  Cough  Dehydration    MEDICATIONS GIVEN DURING THIS VISIT:  Medications   sodium chloride 0.9 % bolus 1,000 mL (1,000 mLs Intravenous New Bag/Given 03/13/20 1233)    Note:  This document was prepared using Dragon voice recognition software and may include unintentional dictation errors.  Nanda Quinton, MD, [facility]    Tamyrah Burbage, [facility], MD 03/13/20 [phone]

## 2020-03-13 NOTE — Progress Notes (Signed)
VIALS EXP 03-13-21

## 2020-03-13 NOTE — Discharge Instructions (Signed)
You were seen in the emergency department today with lightheadedness and muscle aches.  We gave IV fluids and your lab work was all reassuring.  Please continue your medications at home as prescribed and follow closely with your primary care doctor.  If you develop passing out, chest pain, shortness of breath, new/suddenly worsening symptoms please return to the emergency department for reevaluation.

## 2020-03-14 ENCOUNTER — Encounter: Payer: Self-pay | Admitting: Allergy & Immunology

## 2020-03-14 ENCOUNTER — Ambulatory Visit: Payer: 59

## 2020-03-16 DIAGNOSIS — J3089 Other allergic rhinitis: Secondary | ICD-10-CM | POA: Diagnosis not present

## 2020-03-16 NOTE — Progress Notes (Signed)
Additional labels needed.

## 2020-03-17 LAB — SPECIMEN STATUS

## 2020-03-17 LAB — SPECIMEN STATUS REPORT

## 2020-03-25 ENCOUNTER — Telehealth: Payer: Self-pay | Admitting: Allergy & Immunology

## 2020-03-25 MED ORDER — ONDANSETRON HCL 4 MG PO TABS: 4.0000 mg | ORAL_TABLET | Freq: Three times a day (TID) | ORAL | 0 refills | Status: DC | PRN

## 2020-03-25 NOTE — Telephone Encounter (Signed)
Patient contacted me requesting Zofran. [place] and is having some nausea after having receiving her [place] #2 yesterday. Confirmed pharmacy and sent it in.   I also talked to her earlier this week after her allergy shots. She strongly prefers to stay inside of the office after her injections, at least initially. She realizes that this is against the COVID protocol, but given her history of anxiety I think this is probably a good idea.   Salvatore Marvel, MD Allergy and [place] [facility] [place]

## 2020-03-26 ENCOUNTER — Other Ambulatory Visit: Payer: Self-pay | Admitting: Allergy & Immunology

## 2020-03-27 ENCOUNTER — Telehealth: Payer: Self-pay | Admitting: *Deleted

## 2020-03-27 NOTE — Telephone Encounter (Signed)
Patient called office to give update on how she was doing after receiving her Covid vaccine.  Patient states she is feeling better.  Nausea and headache is better.  Patient will contact office if she has any issues or concerns.

## 2020-03-28 NOTE — Telephone Encounter (Signed)
Awesome. That is good to hear.   Salvatore Marvel, MD Allergy and [place] [facility] [place]

## 2020-03-31 ENCOUNTER — Encounter: Payer: Self-pay | Admitting: Allergy & Immunology

## 2020-04-04 ENCOUNTER — Ambulatory Visit: Payer: 59

## 2020-04-07 LAB — OTHER LAB TEST

## 2020-04-11 ENCOUNTER — Ambulatory Visit: Payer: 59

## 2020-04-14 ENCOUNTER — Other Ambulatory Visit: Payer: Self-pay

## 2020-04-14 ENCOUNTER — Encounter (HOSPITAL_BASED_OUTPATIENT_CLINIC_OR_DEPARTMENT_OTHER): Payer: Self-pay | Admitting: Emergency Medicine

## 2020-04-14 ENCOUNTER — Emergency Department (HOSPITAL_BASED_OUTPATIENT_CLINIC_OR_DEPARTMENT_OTHER)
Admission: EM | Admit: 2020-04-14 | Discharge: 2020-04-14 | Disposition: A | Discharge status: Home / Self Care | Payer: 59 | Attending: Emergency Medicine | Admitting: Emergency Medicine

## 2020-04-14 DIAGNOSIS — R110 Nausea: Secondary | ICD-10-CM | POA: Diagnosis present

## 2020-04-14 DIAGNOSIS — K2900 Acute gastritis without bleeding: Secondary | ICD-10-CM | POA: Diagnosis not present

## 2020-04-14 LAB — COMPREHENSIVE METABOLIC PANEL
ALT: 21 U/L (ref 0–44)
AST: 18 U/L (ref 15–41)
Albumin: 3.8 g/dL (ref 3.5–5.0)
Alkaline Phosphatase: 64 U/L (ref 38–126)
Anion gap: 11 (ref 5–15)
BUN: 15 mg/dL (ref 6–20)
CO2: 24 mmol/L (ref 22–32)
Calcium: 9 mg/dL (ref 8.9–10.3)
Chloride: 105 mmol/L (ref 98–111)
Creatinine, Ser: 0.74 mg/dL (ref 0.44–1.00)
GFR, Estimated: >60 mL/min (ref >60)
Glucose, Bld: 103 mg/dL — ABNORMAL HIGH (ref 70–99)
Potassium: 3.7 mmol/L (ref 3.5–5.1)
Sodium: 140 mmol/L (ref 135–145)
Total Bilirubin: 0.6 mg/dL (ref 0.3–1.2)
Total Protein: 7.5 g/dL (ref 6.5–8.1)

## 2020-04-14 LAB — CBC
HCT: 41.2 % (ref 36.0–46.0)
Hemoglobin: 13.6 g/dL (ref 12.0–15.0)
MCH: 27.3 pg (ref 26.0–34.0)
MCHC: 33 g/dL (ref 30.0–36.0)
MCV: 82.7 fL (ref 80.0–100.0)
Platelets: 274 10*3/uL (ref 150–400)
RBC: 4.98 MIL/uL (ref 3.87–5.11)
RDW: 12.8 % (ref 11.5–15.5)
WBC: 6.5 10*3/uL (ref 4.0–10.5)
nRBC: 0 % (ref 0.0–0.2)

## 2020-04-14 LAB — HCG, SERUM, QUALITATIVE: Preg, Serum: NEGATIVE

## 2020-04-14 LAB — LIPASE, BLOOD: Lipase: 28 U/L (ref 11–51)

## 2020-04-14 MED ORDER — HALOPERIDOL LACTATE 5 MG/ML IJ SOLN
2.0000 mg | Freq: Once | INTRAMUSCULAR | Status: AC
  Administered 2020-04-14: 2 mg via INTRAVENOUS
  Filled 2020-04-14: qty 1

## 2020-04-14 MED ORDER — SODIUM CHLORIDE 0.9 % IV BOLUS
1000.0000 mL | Freq: Once | INTRAVENOUS | Status: AC
  Administered 2020-04-14: 1000 mL via INTRAVENOUS

## 2020-04-14 MED ORDER — ONDANSETRON 4 MG PO TBDP: 4.0000 mg | ORAL_TABLET | Freq: Three times a day (TID) | ORAL | 0 refills | Status: DC | PRN

## 2020-04-14 MED ORDER — PROMETHAZINE HCL 25 MG PO TABS: 25.0000 mg | ORAL_TABLET | Freq: Four times a day (QID) | ORAL | 0 refills | Status: DC | PRN

## 2020-04-14 NOTE — ED Triage Notes (Signed)
Reports nausea with burning in stomach last night.  Treated for gerd at home.  Then reports temp of 99.0.  Took tylenol.  Now continues to have nausea.  Took zofran, protonix, pepcid, and carafate this morning.  Reports she was dizzy this morning and had HR of 135.

## 2020-04-14 NOTE — ED Notes (Signed)
Pt reports improvement in nausea, however medication did make her feel "jumpy"

## 2020-04-14 NOTE — ED Provider Notes (Signed)
[facility] Emergency Department Provider Note MRN:  [id number]  Arrival date & time: 04/14/20     Chief Complaint   Nausea   History of Present Illness   Nicole Mendoza is a 29 y.o. year-old female with a history of hypertension, IBS, GERD presenting to the ED with chief complaint of nausea.  Patient's child came down with diarrheal illness from daycare few days ago.  For the past 2 days patient has been experiencing intermittent nausea, burning left upper quadrant pain, diarrhea yesterday but not today.  Poor appetite during this time, feeling lightheaded, feeling dehydrated.  Elevated heart rate throughout the night tonight.  Low-grade temperatures around 99 degrees at home.  Denies lower abdominal pain, no vaginal bleeding or discharge, no chest pain or shortness of breath.  Symptoms are moderate, not improving with multiple home medications.  Review of Systems  A complete 10 system review of systems was obtained and all systems are negative except as noted in the HPI and PMH.   Patient's Health History    Past Medical History:  Diagnosis Date   Allergic rhinitis    Angio-edema    Asthma    Fatty liver    GERD (gastroesophageal reflux disease)    Hypertension    Recurrent upper respiratory infection (URI)    Tachycardia     Past Surgical History:  Procedure Laterality Date   NO PAST SURGERIES      Family History  Problem Relation Age of Onset   Allergic rhinitis Father    Asthma Father    Allergic rhinitis Sister    Asthma Sister    Angioedema Neg Hx    Atopy Neg Hx    Eczema Neg Hx    Immunodeficiency Neg Hx    Urticaria Neg Hx     Social History   Socioeconomic History   Marital status: Married    Spouse name: Not on file   Number of children: 0   Years of education: Not on file   Highest education level: Not on file  Occupational History   Not on file  Tobacco Use   Smoking status: Never Smoker    Smokeless tobacco: Never Used  Vaping Use   Vaping Use: Never used  Substance and Sexual Activity   Alcohol use: No    Alcohol/week: 0.0 standard drinks   Drug use: No   Sexual activity: Yes    Birth control/protection: Pill  Other Topics Concern   Not on file  Social History Narrative   Not on file   Social Determinants of Health   Financial Resource Strain:    Difficulty of Paying Living Expenses: Not on file  Food Insecurity:    Worried About Charity fundraiser in the Last Year: Not on file   [employer] of Food in the Last Year: Not on file  Transportation Needs:    Lack of Transportation (Medical): Not on file   Lack of Transportation (Non-Medical): Not on file  Physical Activity:    Days of Exercise per Week: Not on file   Minutes of Exercise per Session: Not on file  Stress:    Feeling of Stress : Not on file  Social Connections:    Frequency of Communication with Friends and Family: Not on file   Frequency of Social Gatherings with Friends and Family: Not on file   Attends Religious Services: Not on file   Active Member of Clubs or Organizations: Not on file   Attends  Archivist Meetings: Not on file   Marital Status: Not on file  Intimate Partner Violence:    Fear of Current or Ex-Partner: Not on file   Emotionally Abused: Not on file   Physically Abused: Not on file   Sexually Abused: Not on file     Physical Exam   Vitals:   04/14/20 0727 04/14/20 0835  BP: (!) 167/81 129/75  Pulse: (!) 118 98  Resp: 18   Temp: 98.3 F (36.8 C)   SpO2: 98% 97%    CONSTITUTIONAL: Well-appearing, NAD NEURO:  Alert and oriented x 3, no focal deficits EYES:  eyes equal and reactive ENT/NECK:  no LAD, no JVD CARDIO: Regular rate, well-perfused, normal S1 and S2 PULM:  CTAB no wheezing or rhonchi GI/GU:  normal bowel sounds, non-distended, non-tender MSK/SPINE:  No gross deformities, no edema SKIN:  no rash, atraumatic PSYCH:   Appropriate speech and behavior  *Additional and/or pertinent findings included in MDM below  Diagnostic and Interventional Summary    EKG Interpretation  Date/Time:  Friday April 14 2020 07:49:52 EDT Ventricular Rate:  104 PR Interval:    QRS Duration: 88 QT Interval:  320 QTC Calculation: 421 R Axis:   76 Text Interpretation: Sinus tachycardia Borderline T abnormalities, diffuse leads Confirmed by Gerlene Fee [phone]) on 04/14/2020 8:45:49 AM      Labs Reviewed  COMPREHENSIVE METABOLIC PANEL - Abnormal; Notable for the following components:      Result Value   Glucose, Bld 103 (*)    All other components within normal limits  CBC  LIPASE, BLOOD  HCG, SERUM, QUALITATIVE    No orders to display    Medications  sodium chloride 0.9 % bolus 1,000 mL (1,000 mLs Intravenous New Bag/Given 04/14/20 0804)  haloperidol lactate (HALDOL) injection 2 mg (2 mg Intravenous Given 04/14/20 0804)     Procedures  /  Critical Care Procedures  ED Course and Medical Decision Making  I have reviewed the triage vital signs, the nursing notes, and pertinent available records from the EMR.  Listed above are laboratory and imaging tests that I personally ordered, reviewed, and interpreted and then considered in my medical decision making (see below for details).  Suspect viral gastritis given the sick contact.  Abdomen is soft and nontender.  No fever here, mildly tachycardic thought to be due to dehydration in the setting of poor p.o. intake.  Providing IV fluids.  Patient has tried multiple doses of Zofran, Carafate, etc. at home.  Will provide IV Haldol for nausea and discomfort and reassess.     Work-up reassuring, feeling better, continued benign abdominal exam, appropriate for discharge with symptomatic management.  Barth Kirks. Sedonia Small, [place] [email or web address]  Final Clinical Impressions(s) / ED Diagnoses     ICD-10-CM   1.  Acute gastritis without hemorrhage, unspecified gastritis type  K29.00     ED Discharge Orders         Ordered    promethazine (PHENERGAN) 25 MG tablet  Every 6 hours PRN        04/14/20 0844    ondansetron (ZOFRAN ODT) 4 MG disintegrating tablet  Every 8 hours PRN        04/14/20 0844           Discharge Instructions Discussed with and Provided to Patient:     Discharge Instructions     You were evaluated in the Emergency Department and after careful evaluation, we did  not find any emergent condition requiring admission or further testing in the hospital.  Your exam/testing today was overall reassuring.  Symptoms seem to be due to a viral gastritis.  Continue your home medications as needed.  We have refilled your Zofran.  For nausea not responding to your home medicines, you can use the Phenergan nausea medication.  Please return to the Emergency Department if you experience any worsening of your condition.  Thank you for allowing [place] to be a part of your care.        Maudie Flakes, MD 04/14/20 [phone]

## 2020-04-14 NOTE — Discharge Instructions (Addendum)
You were evaluated in the Emergency Department and after careful evaluation, we did not find any emergent condition requiring admission or further testing in the hospital.  Your exam/testing today was overall reassuring.  Symptoms seem to be due to a viral gastritis.  Continue your home medications as needed.  We have refilled your Zofran.  For nausea not responding to your home medicines, you can use the Phenergan nausea medication.  Please return to the Emergency Department if you experience any worsening of your condition.  Thank you for allowing [place] to be a part of your care.

## 2020-04-17 ENCOUNTER — Other Ambulatory Visit: Payer: Self-pay | Admitting: Allergy & Immunology

## 2020-04-17 DIAGNOSIS — J45901 Unspecified asthma with (acute) exacerbation: Secondary | ICD-10-CM

## 2020-04-17 DIAGNOSIS — J3089 Other allergic rhinitis: Secondary | ICD-10-CM

## 2020-04-25 ENCOUNTER — Other Ambulatory Visit: Payer: Self-pay | Admitting: Allergy & Immunology

## 2020-04-27 ENCOUNTER — Telehealth: Payer: Self-pay | Admitting: Allergy & Immunology

## 2020-04-27 NOTE — Telephone Encounter (Signed)
We received the results from the Streptococcal avidity assay. It demonstrated a protective result to only 6 out of 23 serotypes.  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  I will route this to [name] to get this approval started.  Meliyah tends to be prone to adverse reactions to medications, so let us start with Xembify (600mg /kg/month equivalent every 2 weeks).   Salvatore Marvel, MD [facility] [place] [facility] [place]

## 2020-05-02 ENCOUNTER — Other Ambulatory Visit: Payer: Self-pay | Admitting: Allergy & Immunology

## 2020-05-02 MED ORDER — PULMICORT FLEXHALER 180 MCG/ACT IN AEPB: 2.0000 | INHALATION_SPRAY | Freq: Two times a day (BID) | RESPIRATORY_TRACT | 5 refills | Status: DC

## 2020-05-02 NOTE — Telephone Encounter (Signed)
Just FYI patient has [employer] Ins. I have started approval for Cuvitru but unless they have added to preferred she will have to use 3 of 4 preferred drugs to obtain approval. Will let you know but I think it is still Hizentra, Gammagard, GammunexC and Gammaked

## 2020-05-02 NOTE — Telephone Encounter (Signed)
Patient has tried the Pulmicort flexhaler. I have submitted the PA again.

## 2020-05-02 NOTE — Telephone Encounter (Signed)
Spoke with patient, informed her of Dr. Gillermina Hu recommendation. Patient verbalized understanding with the new inhaler. Patient did state that about a month ago our office was working on a PA for her Asmanex and informed me that Dr. Ernst Bowler wrote a letter to appeal a denial she received. I see Lonn Georgia was handling this matter and will reach out to her to see if she knows if it was approved or denied.

## 2020-05-02 NOTE — Telephone Encounter (Signed)
Prior authorization has been submitted again.

## 2020-05-02 NOTE — Telephone Encounter (Signed)
Patient reached out to ask about her Asmanex PA. She is currently on Pulmicort nebulizer treatments BID, but would prefer an inhaler instead due to time constraints.  I am not sure if she has ever tried Pulmicort Flexhaler, but let us try sending in the 160 mcg 2 puffs twice daily.  We can also check and see where that Asmanex PA is.  Salvatore Marvel, MD [facility] and [place] [facility] [place]

## 2020-05-02 NOTE — Telephone Encounter (Signed)
Xembify? Otherwise let's do Hizentra.   Salvatore Marvel, MD All[facility] and [place] [facility] [place]

## 2020-05-03 MED ORDER — ASMANEX (30 METERED DOSES) 220 MCG/INH IN AEPB: 2.0000 | INHALATION_SPRAY | Freq: Two times a day (BID) | RESPIRATORY_TRACT | 5 refills | Status: DC

## 2020-05-03 NOTE — Telephone Encounter (Signed)
Received a fax from pharmacy that Asmanex has been approved. Called the patient and informed. She stated that she will call the pharmacy and tell them not to worry about the Pulmicort inhaler. Advised to see how much Asmanex is with the approval first then proceed, patient verbalized understanding. Sent in new Rx for [facility]ex to [facility] [place] and faxed PA approval over to pharmacy.

## 2020-05-03 NOTE — Addendum Note (Signed)
Addended by: Chip Boer R on: 05/03/2020 12:26 PM   Modules accepted: Orders

## 2020-05-04 NOTE — Telephone Encounter (Signed)
I contacted the patient to let her know about the asmanex approval.

## 2020-05-04 NOTE — Telephone Encounter (Signed)
Awesome! Thank you so much. I am glad we could all work together to get this taken care of.

## 2020-05-04 NOTE — Telephone Encounter (Signed)
This was approved and patient is aware.

## 2020-05-10 NOTE — Telephone Encounter (Signed)
When I submitted approval for Cuvitru it is stating Cutaquig is preferred product

## 2020-05-15 ENCOUNTER — Ambulatory Visit: Payer: 59 | Admitting: Neurology

## 2020-05-15 NOTE — Telephone Encounter (Signed)
Was there another option? Denman George? Hizentra? Etc?   Nicole Marvel, MD [facility] [place] [facility] [place]

## 2020-05-26 NOTE — Telephone Encounter (Signed)
Thank you!  Hopefully I convince her to actually get it.  I thought we were on the same page with her, but we will see her next visit.  Salvatore Marvel, MD [facility] and [place] [facility] [place]

## 2020-05-26 NOTE — Telephone Encounter (Signed)
I have gotten approval for Hizentra for patient. I did see email I assume was from patient. Please advise if patient wants to proceed

## 2020-05-30 ENCOUNTER — Telehealth: Payer: Self-pay | Admitting: Allergy & Immunology

## 2020-05-30 MED ORDER — AZITHROMYCIN 250 MG PO TABS: ORAL_TABLET | ORAL | 0 refills | Status: DC

## 2020-05-30 NOTE — Telephone Encounter (Signed)
L/m for patient that I have Hizentra approval and status of whether to submit or wait until she sees Dr gallagher

## 2020-05-30 NOTE — Telephone Encounter (Signed)
Spoke to patient and she will talk same over with Dr Ernst Bowler when she sees him on Thursday in clinic

## 2020-05-30 NOTE — Telephone Encounter (Signed)
Patient actually contacted me last week about some worsening sinus congestion, but her trajectory was improving and she declined antibiotics.  However, she contacted me today on her congestion and sinus pain had worsened.  Therefore, we are going to send in an azithromycin course.  Salvatore Marvel, MD [facility] and [place] [facility] [place]

## 2020-06-01 ENCOUNTER — Ambulatory Visit (INDEPENDENT_AMBULATORY_CARE_PROVIDER_SITE_OTHER): Payer: 59 | Admitting: Allergy & Immunology

## 2020-06-01 ENCOUNTER — Other Ambulatory Visit: Payer: Self-pay

## 2020-06-01 ENCOUNTER — Encounter: Payer: Self-pay | Admitting: Allergy & Immunology

## 2020-06-01 DIAGNOSIS — J4540 Moderate persistent asthma, uncomplicated: Secondary | ICD-10-CM

## 2020-06-01 DIAGNOSIS — J3089 Other allergic rhinitis: Secondary | ICD-10-CM

## 2020-06-01 DIAGNOSIS — T7800XD Anaphylactic reaction due to unspecified food, subsequent encounter: Secondary | ICD-10-CM | POA: Diagnosis not present

## 2020-06-01 DIAGNOSIS — K219 Gastro-esophageal reflux disease without esophagitis: Secondary | ICD-10-CM

## 2020-06-01 DIAGNOSIS — J302 Other seasonal allergic rhinitis: Secondary | ICD-10-CM

## 2020-06-01 DIAGNOSIS — D808 Other immunodeficiencies with predominantly antibody defects: Secondary | ICD-10-CM

## 2020-06-01 MED ORDER — DEXAMETHASONE 4 MG PO TABS: ORAL_TABLET | ORAL | 0 refills | Status: DC

## 2020-06-01 MED ORDER — AZITHROMYCIN 500 MG PO TABS: 500.0000 mg | ORAL_TABLET | ORAL | 3 refills | Status: DC

## 2020-06-01 NOTE — Patient Instructions (Addendum)
1. Seasonal and perennial allergic rhinitis (trees, weeds, grasses and dust mites) - with OME on the LEFT  - Continue with: Zyrtec (cetirizine) 10mg  tablet once daily, Singulair (montelukast) 10mg  daily + Xhance two sprays per nostril twice daily.   - Since we cannot do prednisone with your second COVID vaccine coming up, we are going to continue with the doxycycline for one more week (prescription sent in).  - Information on allergy shots provided.  - Consent signed (call us when you want to start).  - Prescription for hydroxyzine provided.  2. Fatigue - We are going to get some EBV titers just to screen for this. - I anticipate that you will be AT least IgG positive (meaning a past infection). - I will send you a MyChart message with the results.   3. Adverse food reaction (tree nuts, peanuts)  - Continue to avoid peanuts and tree nuts.  Wynona Luna is up to date.     4. Mild persistent asthma, uncomplicated - We are not going to make any medication changes at this time.  - Lung testing looked great last week so we did not do that today.  - Daily controller medication(s): Asmanex 25mcg two puffs twice daily + Singulair 10mg   - Prior to physical activity: Xopenex 2 puffs 10-15 minutes before physical activity. - Rescue medications: Xopenex 4 puffs every 4-6 hours as needed - Changes during respiratory infections or worsening symptoms: Increase Asmanex 226mcg to 4 puffs twice daily for ONE TO TWO WEEKS. - Asthma control goals:  * Full participation in all desired activities (may need albuterol before activity) * Albuterol use two time or less a week on average (not counting use with activity) * Cough interfering with sleep two time or less a month * Oral steroids no more than once a year * No hospitalizations  5. No follow-ups on file. This can be an in-person, a virtual Webex or a telephone follow up visit.   Please inform us of any Emergency Department visits, hospitalizations, or  changes in symptoms. Call us before going to the ED for breathing or allergy symptoms since we might be able to fit you in for a sick visit. Feel free to contact us anytime with any questions, problems, or concerns.  It was a pleasure to see you again today!  Websites that have reliable patient information: 1. American Academy of Asthma, Allergy, and Immunology: www.aaaai.org 2. Food Allergy Research and Education (FARE): foodallergy.org 3. Mothers of Asthmatics: http://www.asthmacommunitynetwork.org 4. American College of Allergy, Asthma, and Immunology: www.acaai.org   COVID-19 Vaccine Information can be found at: ShippingScam.co.uk For questions related to vaccine distribution or appointments, please email [email or web address] or call [phone].     "Like" [place] on Facebook and Instagram for our latest updates!      ]  Make sure you are registered to vote! If you have moved or changed any of your contact information, you will need to get this updated before voting!  In some cases, you MAY be able to register to vote online: [email or web address]

## 2020-06-01 NOTE — Progress Notes (Signed)
RE: Nicole Mendoza MRN: [id number] DOB: 1991-05-12 Date of Telemedicine Visit: 06/01/2020  Referring provider: Rubie Maid, MD Primary care provider: Rubie Maid, MD  Chief Complaint: Asthma (Talk about treatment and asthma flare)   Telemedicine Follow Up Visit via Telephone: I connected with [name] for a follow up on 06/01/20 by telephone and verified that I am speaking with the correct person using two identifiers.   I discussed the limitations, risks, security and privacy concerns of performing an evaluation and management service by telephone and the availability of in person appointments. I also discussed with the patient that there may be a patient responsible charge related to this service. The patient expressed understanding and agreed to proceed.  Patient is at home.  Provider is at the office.  Visit start time: 10:46 AM Visit end time: 11:06 AM Insurance consent/check in by: [facility] consent and medical assistant/nurse: Dee  History of Present Illness:  She is a 29 y.o. female, who is being followed for multiple atopic complaints. Her previous allergy office visit was in September 2021 with myself.  At that time, she was endorsing shortness of breath.  We did lung testing this looked great.  We worked on getting a chest CT approved.  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  We also decided to do additional immunology labs, including a streptococcal avidity assay.  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  We also continue with Pulmicort twice daily via nebulizer until we got Asmanex approved.  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  We did submit for immunoglobulin replacement and this has been improved for [facility].  However, she tells me she has not made the decision whether or not she is going to start.   We discussed using azithromycin 3 times a week instead of the immunoglobulin replacement.  Since the last visit, she has had two COVID exposures over the last week.  Her adopted son's teacher was diagnosed with COVID last week.  His last exposure to her was Friday.  They were tested on Tuesday and thus far everything has been negative.  Unfortunately, on Tuesday, he also saw his brother who is 7.  They got a call the following day and that he ended up testing positive for COVID-19.  Thus far, Maniyah seems to be doing well but her husband has started developing severe congestion.  All of their COVID-19 tests are still pending, although there were some rapid test that were negative.  Of note, both Trenita and her husband [name] are vaccinated.  Aubriegh reports that she is having some shortness of breath currently.  She is on the Asmanex 2 puffs twice daily.  She is also considering adding on the Pulmicort twice a day and wants to know what I think about this.  She has not needed prednisone.  We did send in azithromycin for 5 days a few days ago because of this respiratory illness.  She still has not decided what to do about her specific antibody deficiency.  Asthma/Respiratory Symptom History: [name] is wondering whether she needs to try something else for controlling her breathing.  We did discuss the use of Spiriva, but when palpitations was mentioned as a side effect, she shut down to that idea.  She has thought about doing a different biologic.  She has been [place] in he  in  the past, which controlled her symptoms well, but it ended up causing her around 1 week of postinjection malaise.  She is somewhat open to trying another biologic.  She has been on combined ICS/LABA in the past, but develops elevated blood pressure palpitations from these.  She is currently on Singulair which she is tolerating well without a problem.  She is not sure if she has been on Spiriva.  Allergic Rhinitis Symptom History: She  remains on her nose spray.  She is also on an antihistamine every day.  She has she has not made the decision to start allergen immunotherapy at this point, but may consider it in January.. She did not start allergy shots yet. They have been mixed since September 2021. She is nervous about starting them.   They have moved into the new house.  They were previously living with her mother and her sister as well as nephew.  Otherwise, there have been no changes to her past medical history, surgical history, family history, or social history.  Assessment and Plan:  Antasia is a 29 y.o. female with:  Adverse reaction to COVID19 vaccination - clearly not IgE mediated, but she is now fully vaccinated   Mild persistent asthma - well controlled on Asmanex BID (holding Fasenra)  Recurrent infections- improved following Pneumovax, but then worsened (streptococcal avidity assay abnormal)  Seasonal and perennial allergic rhinitis(trees, weeds, grasses and dust mites)-not well controlled  Gastroesophageal reflux disease- onpantoprazowe can leand famotidineand PRN carafate  Adverse food reactions -resolvedwith minimizing exposure to food additives and avoiding peanuts/tree nuts  Snoring with perceived poor sleep quality-improved with use of the CPAP  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  She anticipates starting allergy shots in January.  She is open to doing immunoglobulin replacement, but would like to try the less invasive treatment first.  I did send in a dose of dexamethasone to have on hand in case her breathing nose dived over the weekend. She does have COVID testing pending.   Diagnostics: None.  Medication List:  Current Outpatient Medications  Medication Sig Dispense Refill  . Ascorbic Acid (SUPER C COMPLEX PO)     . azithromycin (ZITHROMAX) 250 MG tablet Take two tabs on day 1  and one tab daily for four more days. 6 each 0  . budesonide (PULMICORT FLEXHALER) 180 MCG/ACT inhaler Inhale 2 puffs into the lungs in the morning and at bedtime. 1 each 5  . budesonide (PULMICORT) 0.5 MG/2ML nebulizer solution TAKE 2 MLS (0.5 MG TOTAL) BY NEBULIZATION IN THE MORNING, AT NOON, AND AT BEDTIME. 540 mL 1  . cefdinir (OMNICEF) 300 MG capsule Take 300 mg by mouth 2 (two) times daily.    . cetirizine (ZYRTEC) 10 MG tablet Take by mouth.    . EPINEPHrine (AUVI-Q) 0.3 mg/0.3 mL IJ SOAJ injection Use as directed for severe allergic reactions 4 each 1  . famotidine (PEPCID) 20 MG tablet TAKE 1 TABLET BY MOUTH TWICE A DAY 60 tablet 5  . ferrous sulfate 325 (65 FE) MG tablet 3 (three) times a week.     . Fluticasone Propionate (XHANCE) 93 MCG/ACT EXHU Place 2 sprays into both nostrils 2 (two) times daily. 32 mL 5  . hydrOXYzine (ATARAX/VISTARIL) 25 MG tablet Take 1 tablet (25 mg total) by mouth 3 (three) times daily as needed. 30 tablet 1  . hyoscyamine (ANASPAZ) 0.125 MG TBDP disintergrating tablet Take 1  tablet by mouth as needed for irritation.    Marland Kitchen levalbuterol (XOPENEX) 1.25 MG/3ML nebulizer solution USE 1 VIAL BY NEBULIZATION EVERY 6 (SIX) HOURS AS NEEDED FOR WHEEZING. 90 mL 1  . metoprolol succinate (TOPROL-XL) 100 MG 24 hr tablet Take 100 mg by mouth daily. Take with or immediately following a meal.    . mometasone (ASMANEX, 30 METERED DOSES,) 220 MCG/INH inhaler Inhale 2 puffs into the lungs 2 (two) times daily. 1 each 5  . montelukast (SINGULAIR) 10 MG tablet TAKE 1 TABLET BY MOUTH EVERY DAY 30 tablet 5  . norethindrone (MICRONOR) 0.35 MG tablet Take 0.35 mg by mouth.    . ondansetron (ZOFRAN ODT) 4 MG disintegrating tablet Take 1 tablet (4 mg total) by mouth every 8 (eight) hours as needed for nausea or vomiting. 20 tablet 0  . ondansetron (ZOFRAN) 4 MG tablet Take 1 tablet (4 mg total) by mouth every 8 (eight) hours as needed for nausea or vomiting. 20 tablet 0  . pantoprazole  (PROTONIX) 40 MG tablet Take 40 mg by mouth daily.  3  . predniSONE (DELTASONE) 10 MG tablet Take two tablets (20mg ) twice daily for three days, then one tablet (10mg ) twice daily for three days, then STOP. 18 tablet 1  . Probiotic Product ([place]ana) CAPS     . promethazine (PHENERGAN) 25 MG tablet Take 1 tablet (25 mg total) by mouth every 6 (six) hours as needed for nausea or vomiting. 20 tablet 0  . VITAMIN D, CHOLECALCIFEROL, PO Take by mouth.    . beclomethasone (QVAR REDIHALER) 80 MCG/ACT inhaler Inhale 2 puffs into the lungs twice daily. Rinse, gargle and spit after use. (Patient not taking: Reported on 06/01/2020) 1 each 5  . levalbuterol (XOPENEX HFA) 45 MCG/ACT inhaler Inhale 2 puffs into the lungs every 6 (six) hours as needed for wheezing. 1 each 2   No current facility-administered medications for this visit.   Allergies: Allergies  Allergen Reactions  . Budesonide-Formoterol Fumarate Other (See Comments)    Causes Hypertension  . Penicillins Hives  . Albuterol Palpitations  . Losartan Rash   I reviewed her past medical history, social history, family history, and environmental history and no significant changes have been reported from previous visits.  Review of Systems  Constitutional: Negative for activity change and appetite change.  HENT: Positive for sinus pressure. Negative for congestion, postnasal drip, rhinorrhea and sore throat.   Eyes: Negative for pain, discharge, redness and itching.  Respiratory: Positive for shortness of breath. Negative for wheezing and stridor.   Gastrointestinal: Negative for diarrhea, nausea and vomiting.  Musculoskeletal: Negative for arthralgias, joint swelling and myalgias.  Skin: Negative for rash.  Allergic/Immunologic: Negative for environmental allergies and food allergies.    Objective:  Physical exam not obtained as encounter was done via telephone.   Previous notes and tests were reviewed.  I discussed the  assessment and treatment plan with the patient. The patient was provided an opportunity to ask questions and all were answered. The patient agreed with the plan and demonstrated an understanding of the instructions.   The patient was advised to call back or seek an in-person evaluation if the symptoms worsen or if the condition fails to improve as anticipated.  I provided 20 minutes of non-face-to-face time during this encounter.  It was my pleasure to participate in [place] care today. Please feel free to contact me with any questions or concerns.   Sincerely,  Valentina Shaggy, MD

## 2020-06-13 ENCOUNTER — Ambulatory Visit: Payer: 59 | Admitting: Allergy & Immunology

## 2020-06-16 ENCOUNTER — Telehealth (INDEPENDENT_AMBULATORY_CARE_PROVIDER_SITE_OTHER): Payer: 59 | Admitting: Nurse Practitioner

## 2020-06-16 DIAGNOSIS — U099 Post covid-19 condition, unspecified: Secondary | ICD-10-CM | POA: Diagnosis not present

## 2020-06-16 DIAGNOSIS — R5382 Chronic fatigue, unspecified: Secondary | ICD-10-CM | POA: Diagnosis not present

## 2020-06-16 DIAGNOSIS — G9332 Myalgic encephalomyelitis/chronic fatigue syndrome: Secondary | ICD-10-CM

## 2020-06-16 NOTE — Progress Notes (Signed)
Virtual Visit via Telephone Note  I connected with Nicole Mendoza on 06/16/20 at  9:00 AM EST by telephone and verified that I am speaking with the correct person using two identifiers.  Location: Patient: home Provider: office   I discussed the limitations, risks, security and privacy concerns of performing an evaluation and management service by telephone and the availability of in person appointments. I also discussed with the patient that there may be a patient responsible charge related to this service. The patient expressed understanding and agreed to proceed.   History of Present Illness:  Patient presents today for post COVID care clinic visit follow-up/televisit.  Patient states that overall she has been doing well.  She was last seen here on January 25, 2020.  She was doing better at that time and discharged from our care.  She states that since that time she has been seen in the ED twice for dehydration.  And this has been an ongoing issue for her since Covid.  Patient was doing well overall but was working from home.  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  Patient is requesting FMLA paperwork.  Hopefully she will be able to do lighter duty work.  We discussed the importance of staying well-hydrated and that we can refer her back to physical therapy if needed.    Observations/Objective:  Vitals with BMI 04/14/2020 04/14/2020 04/14/2020  Height - 5\' 8"  -  Weight - 290 lbs -  BMI - 44.1 -  Systolic 129 - 167  Diastolic 75 - 81  Pulse 98 - 118      Assessment and Plan:  Covid long hauler with chronic fatigue:   Stay well hydrated  Stay active  Deep breathing exercises  May take tylenol or fever or pain  Please bring FMLA paper work over next week and we will complete this.    Follow Up Instructions:  Follow up  if needed     I discussed the assessment and  treatment plan with the patient. The patient was provided an opportunity to ask questions and all were answered. The patient agreed with the plan and demonstrated an understanding of the instructions.   The patient was advised to call back or seek an in-person evaluation if the symptoms worsen or if the condition fails to improve as anticipated.  I provided 22 minutes of non-face-to-face time during this encounter.   , NP

## 2020-06-16 NOTE — Patient Instructions (Addendum)
Covid long hauler with chronic fatigue:   Stay well hydrated  Stay active  Deep breathing exercises  May take tylenol or fever or pain  Please bring FMLA paper work over next week and we will complete this.     Follow up:  Follow up  if needed

## 2020-06-20 ENCOUNTER — Other Ambulatory Visit: Payer: Self-pay

## 2020-06-20 ENCOUNTER — Ambulatory Visit (INDEPENDENT_AMBULATORY_CARE_PROVIDER_SITE_OTHER): Payer: 59 | Admitting: Allergy & Immunology

## 2020-06-20 ENCOUNTER — Encounter: Payer: Self-pay | Admitting: Allergy & Immunology

## 2020-06-20 VITALS — BP 140/100 | HR 93 | Temp 98.6°F | Resp 18 | Ht 68.0 in | Wt 290.0 lb

## 2020-06-20 DIAGNOSIS — J4540 Moderate persistent asthma, uncomplicated: Secondary | ICD-10-CM | POA: Diagnosis not present

## 2020-06-20 DIAGNOSIS — J3089 Other allergic rhinitis: Secondary | ICD-10-CM | POA: Diagnosis not present

## 2020-06-20 DIAGNOSIS — J302 Other seasonal allergic rhinitis: Secondary | ICD-10-CM

## 2020-06-20 DIAGNOSIS — D808 Other immunodeficiencies with predominantly antibody defects: Secondary | ICD-10-CM

## 2020-06-20 DIAGNOSIS — K219 Gastro-esophageal reflux disease without esophagitis: Secondary | ICD-10-CM

## 2020-06-20 DIAGNOSIS — T7800XD Anaphylactic reaction due to unspecified food, subsequent encounter: Secondary | ICD-10-CM

## 2020-06-20 NOTE — Patient Instructions (Addendum)
1. Seasonal and perennial allergic rhinitis (trees, weeds, grasses and dust mites) - with OME on the LEFT  - Continue with: Zyrtec (cetirizine) 10mg  tablet once daily, Singulair (montelukast) 10mg  daily + Xhance two sprays per nostril twice daily.   - We will start allergy shots once you are on your Xolair. - I believe your vials are made, but make an appointment in four weeks to start (we should have Xolair administered and approved by then).   2. Adverse food reaction (tree nuts, peanuts)  - Continue to avoid peanuts and tree nuts.  is up to date.    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    4. Mild persistent asthma, uncomplicated - We are not going to make any medication changes at this time.  - Stop the Asmanex and start Pulmicort 0.5mg  nebulizer twice daily. - We are going to work on getting Xolair (anti-IgE) approved.  - Daily controller medication(s): Pulmicort 0.5mg  twice daily via nebulizer + Singulair 10mg   - Prior to physical activity: Xopenex 2 puffs 10-15 minutes before physical activity. - Rescue medications: Xopenex 4 puffs every 4-6 hours as needed - Changes during respiratory infections or worsening symptoms: Add on Pulmicort 0.5mg  FOUR TIMES daily for ONE TO TWO WEEKS. - Asthma control goals:  * Full participation in all desired activities (may need albuterol before activity) * Albuterol use two time or less a week on average (not counting use with activity) * Cough interfering with sleep two time or less a month * Oral steroids no more than once a year * No hospitalizations  5. Return in about 3 months (around 09/18/2020).    Please inform Audry Riles of any Emergency Department visits, hospitalizations, or changes in symptoms. Call before going to the ED for breathing or allergy symptoms since we might be able to fit you in for a sick visit. Feel free to contact 11/18/2020 anytime with any questions, problems, or concerns.  It was  a pleasure to see you again today!  Websites that have reliable patient information: 1. American Academy of Asthma, Allergy, and Immunology: www.aaaai.org 2. Food Allergy Research and Education (FARE): foodallergy.org 3. Mothers of Asthmatics: http://www.asthmacommunitynetwork.org 4. American College of Allergy, Asthma, and Immunology: www.acaai.org   COVID-19 Vaccine Information can be found at: [place] For questions related to vaccine distribution or appointments, please email [email or web address] or call [phone].     "Like" [place] on Facebook and Instagram for our latest updates!       Make sure you are registered to vote! If you have moved or changed any of your contact information, you will need to get this updated before voting!  In some cases, you MAY be able to register to vote online: [email or web address]   Allergy Shots   Allergies are the result of a chain reaction that starts in the immune system. Your immune system controls how your body defends itself. For instance, if you have an allergy to pollen, your immune system identifies pollen as an invader or allergen. Your immune system overreacts by producing antibodies called Immunoglobulin E (IgE). These antibodies travel to cells that release chemicals, causing an allergic reaction.  The concept behind allergy immunotherapy, whether it is received in the form of shots or tablets, is that the immune system can be desensitized to specific allergens that trigger allergy symptoms. Although it requires time and patience, the payback can be long-term relief.  How Do Allergy Shots Work?  Allergy shots work much like  a vaccine. Your body responds to injected amounts of a particular allergen given in increasing doses, eventually developing a resistance and tolerance to it. Allergy shots can lead to decreased, minimal or no allergy  symptoms.  There generally are two phases: build-up and maintenance. Build-up often ranges from three to six months and involves receiving injections with increasing amounts of the allergens. The shots are typically given once or twice a week, though more rapid build-up schedules are sometimes used.  The maintenance phase begins when the most effective dose is reached. This dose is different for each person, depending on how allergic you are and your response to the build-up injections. Once the maintenance dose is reached, there are longer periods between injections, typically two to four weeks.  Occasionally doctors give cortisone-type shots that can temporarily reduce allergy symptoms. These types of shots are different and should not be confused with allergy immunotherapy shots.  Who Can Be Treated with Allergy Shots?  Allergy shots may be a good treatment approach for people with allergic rhinitis (hay fever), allergic asthma, conjunctivitis (eye allergy) or stinging insect allergy.   Before deciding to begin allergy shots, you should consider:  . The length of allergy season and the severity of your symptoms . Whether medications and/or changes to your environment can control your symptoms . Your desire to avoid long-term medication use . Time: allergy immunotherapy requires a major time commitment . Cost: may vary depending on your insurance coverage  Allergy shots for children age 68 and older are effective and often well tolerated. They might prevent the onset of new allergen sensitivities or the progression to asthma.  Allergy shots are not started on patients who are pregnant but can be continued on patients who become pregnant while receiving them. In some patients with other medical conditions or who take certain common medications, allergy shots may be of risk. It is important to mention other medications you talk to your allergist.   When Will I Feel Better?  Some may  experience decreased allergy symptoms during the build-up phase. For others, it may take as long as 12 months on the maintenance dose. If there is no improvement after a year of maintenance, your allergist will discuss other treatment options with you.  If you aren't responding to allergy shots, it may be because there is not enough dose of the allergen in your vaccine or there are missing allergens that were not identified during your allergy testing. Other reasons could be that there are high levels of the allergen in your environment or major exposure to non-allergic triggers like tobacco smoke.  What Is the Length of Treatment?  Once the maintenance dose is reached, allergy shots are generally continued for three to five years. The decision to stop should be discussed with your allergist at that time. Some people may experience a permanent reduction of allergy symptoms. Others may relapse and a longer course of allergy shots can be considered.  What Are the Possible Reactions?  The two types of adverse reactions that can occur with allergy shots are local and systemic. Common local reactions include very mild redness and swelling at the injection site, which can happen immediately or several hours after. A systemic reaction, which is less common, affects the entire body or a particular body system. They are usually mild and typically respond quickly to medications. Signs include increased allergy symptoms such as sneezing, a stuffy nose or hives.  Rarely, a serious systemic reaction called anaphylaxis can develop.  Symptoms include swelling in the throat, wheezing, a feeling of tightness in the chest, nausea or dizziness. Most serious systemic reactions develop within 30 minutes of allergy shots. This is why it is strongly recommended you wait in your doctor's office for 30 minutes after your injections. Your allergist is trained to watch for reactions, and his or her staff is trained and equipped  with the proper medications to identify and treat them.  Who Should Administer Allergy Shots?  The preferred location for receiving shots is your prescribing allergist's office. Injections can sometimes be given at another facility where the physician and staff are trained to recognize and treat reactions, and have received instructions by your prescribing allergist.

## 2020-06-20 NOTE — Progress Notes (Signed)
FOLLOW UP  Date of Service/Encounter:  06/20/20   Assessment:   Adverse reaction to COVID19 vaccination - clearly not IgE mediated, but she is now fully vaccinated   Mild persistent asthma - well controlled on Asmanex BID but with worsening symptoms (initiating Xolair today)  Recurrent infections- improved following Pneumovax, but then worsened (streptococcal avidity assay abnormal)  Seasonal and perennial allergic rhinitis(trees, weeds, grasses and dust mites)-not well controlled  Gastroesophageal reflux disease- onpantoprazowe can leand famotidineand PRN carafate  Adverse food reactions -resolvedwith minimizing exposure to food additives and avoiding peanuts/tree nuts  Snoring with perceived poor sleep quality-improved with use of the CPAP  Possible POTS syndrome   Tenae continues to have issues with shortness of breath which is resolved with the use of her Xopenex. Plan/Recommendations:   1. Seasonal and perennial allergic rhinitis (trees, weeds, grasses and dust mites) - Continue with: Zyrtec (cetirizine) 10mg  tablet once daily, Singulair (montelukast) 10mg  daily + Xhance two sprays per nostril twice daily.   - We will start allergy shots once you are on your Xolair. - I believe your vials are made, but make an appointment in four weeks to start (we should have Xolair administered and approved by then).   2. Adverse food reaction (tree nuts, peanuts)  - Continue to avoid peanuts and tree nuts.  is up to date.    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    4. Mild persistent asthma, uncomplicated - We are not going to make any medication changes at this time.  - Stop the Asmanex and start Pulmicort 0.5mg  nebulizer twice daily. - We are going to work on getting Xolair (anti-IgE) approved.  - Daily controller medication(s): Pulmicort 0.5mg  twice daily via nebulizer + Singulair 10mg   - Prior to  physical activity: Xopenex 2 puffs 10-15 minutes before physical activity. - Rescue medications: Xopenex 4 puffs every 4-6 hours as needed - Changes during respiratory infections or worsening symptoms: Add on Pulmicort 0.5mg  FOUR TIMES daily for ONE TO TWO WEEKS. - Asthma control goals:  * Full participation in all desired activities (may need albuterol before activity) * Albuterol use two time or less a week on average (not counting use with activity) * Cough interfering with sleep two time or less a month * Oral steroids no more than once a year * No hospitalizations  5. Return in about 3 months (around 09/18/2020).    Subjective:   Nicole Mendoza is a 30 y.o. female presenting today for follow up of  Chief Complaint  Patient presents with  . Asthma    Nicole Mendoza has a history of the following: Patient Active Problem List   Diagnosis Date Noted  . Body aches 10/05/2019  . Muscle pain 10/05/2019  . Physical deconditioning 10/05/2019  . Palpitations 09/02/2019  . Cardiac murmur 09/02/2019  . History of 2019 novel coronavirus disease (COVID-19) 09/02/2019  . History of COVID-19 09/01/2019  . Tachycardia 09/01/2019  . Salivary stone 09/01/2019  . Thrush 09/01/2019  . Fatigue 09/01/2019  . Asthma 09/01/2019  . Irregular bowel habits 07/29/2019  . 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 Seasonal and perennial allergic rhinitis 01/22/2019  . Morbid obesity with BMI of 45.0-49.9, adult (HCC) 10/28/2017  . Moderate persistent asthma without complication 07/29/2017  . Mild persistent asthma without complication 07/29/2017  . Essential hypertension 04/22/2017  . Dysmenorrhea 06/26/2016  . Menorrhagia with regular cycle 06/26/2016  . Gastroesophageal reflux disease 12/22/2015  .  Current use of beta blocker 12/22/2015  . Asthma with acute exacerbation 05/09/2015  . Acute maxillary sinusitis 05/09/2015  . Hypertension 05/09/2015  . Allergic rhinitis 11/02/2014   . Iron deficiency anemia 11/02/2014  . Mild intermittent asthma without complication [id number]  . Morbid obesity due to excess calories ([place]) 11/02/2014  . Vitamin D deficiency 11/02/2014  . Strain of back 03/25/2014    History obtained from: chart review and patient.  Nicole Mendoza is a 30 y.o. female presenting for skin testing.  At the last visit, we started azithromycin 3 times weekly to treat her specific antibody deficiency.  We did talk about immunoglobulin replacement therapy, but she wanted to hold off on that for now.  We continued Asmanex twice daily.  She had been on Fasenra in the past, but she had kind of a 1 to 2-day feeling of fatigue and illness after each injection so she stopped it.  She has a history of perennial and seasonal allergic rhinitis.  Since last visit, she unfortunately has had worsening breathing over the past week or so.  I given her some prednisone to have on hand and I recommended that she start it when she contacted me.   Asthma/Respiratory Symptom History: She remains on Asmanex 2 puffs twice daily.  Despite this, she continues to have some shortness of breath and difficulty with performing her activities of daily living.  She will use albuterol with improvement in the symptoms.  She is open to trying other approaches.  She has never been able to tolerate a combined ICS/LABA.  This is due to the tachycardia that ensues with any LABA that we try.  She does have Pulmicort on hand as well and is willing to use that nebulized twice daily as well.  We discussed Biologics and she does report that she never felt better than when she was on Fasenra.  However, she would have the 24 to 48-hour feeling of fatigue and flulike symptoms, which she did not want to endure.  She is open to other ideas.  Allergic Rhinitis Symptom History: She has not started her allergy shots, despite wanting to at the last visit.  She said she chickened out because she kept remembering the reaction she  would have with the shots when she was getting them with Dr. Shaune Leeks.  She knows that she needs to do them.  Food Allergy Symptom History: She continues to avoid peanuts and tree nuts.  There have been no accidental ingestions.  She does have left knee pain.  GERD Symptom History: She remains on pantoprazole. She does have carafate that she will use when symptoms worsen.    Otherwise, there have been no changes to her past medical history, surgical history, family history, or social history.    Review of Systems  Constitutional: Negative.  Negative for chills, fever, malaise/fatigue and weight loss.  HENT: Positive for congestion and sinus pain. Negative for ear discharge and ear pain.   Eyes: Negative for pain, discharge and redness.  Respiratory: Positive for shortness of breath. Negative for cough, sputum production and wheezing.   Cardiovascular: Negative.  Negative for chest pain and palpitations.  Gastrointestinal: Positive for heartburn. Negative for abdominal pain, constipation, diarrhea, nausea and vomiting.  Skin: Negative.  Negative for itching and rash.  Neurological: Negative for dizziness and headaches.  Endo/Heme/Allergies: Positive for environmental allergies. Does not bruise/bleed easily.       Objective:   Blood pressure (!) 140/100, pulse 93, temperature 98.6 F (37 C), temperature  source Temporal, resp. rate 18, height 5\' 8"  (1.727 m), weight 290 lb (131.5 kg), SpO2 98 %. Body mass index is 44.09 kg/m.   Physical Exam:  Physical Exam Constitutional:      Appearance: She is well-developed.     Comments: Talkative. Speaking in full sentences.   HENT:     Head: Normocephalic and atraumatic.     Right Ear: Tympanic membrane, ear canal and external ear normal.     Left Ear: Tympanic membrane, ear canal and external ear normal.     Nose: No nasal deformity, septal deviation, mucosal edema, rhinorrhea or epistaxis.     Right Turbinates: Enlarged and swollen.      Left Turbinates: Enlarged and swollen.     Right Sinus: No maxillary sinus tenderness or frontal sinus tenderness.     Left Sinus: No maxillary sinus tenderness or frontal sinus tenderness.     Comments: No polyps that I can appreciate. Erythematous turbinates. Some clear mucous present.     Mouth/Throat:     Mouth: Oropharynx is clear and moist. Mucous membranes are not pale and not dry.     Pharynx: Uvula midline.  Eyes:     General:        Right eye: No discharge.        Left eye: No discharge.     Extraocular Movements: EOM normal.     Conjunctiva/sclera: Conjunctivae normal.     Right eye: Right conjunctiva is not injected. No chemosis.    Left eye: Left conjunctiva is not injected. No chemosis.    Pupils: Pupils are equal, round, and reactive to light.  Cardiovascular:     Rate and Rhythm: Normal rate and regular rhythm.     Heart sounds: Normal heart sounds.  Pulmonary:     Effort: Pulmonary effort is normal. No tachypnea, accessory muscle usage or respiratory distress.     Breath sounds: Normal breath sounds. No wheezing, rhonchi or rales.     Comments: Moving air well in all lung fields. No increased work of breathing noted. Possible some decreased air movement at the bases.  Chest:     Chest wall: No tenderness.  Lymphadenopathy:     Cervical: Cervical adenopathy present.     Comments: [place] bilateral LAD in anterior chains bilaterally.  Skin:    Coloration: Skin is not pale.     Findings: No abrasion, erythema, petechiae or rash. Rash is not papular, urticarial or vesicular.  Neurological:     Mental Status: She is alert.  Psychiatric:        Mood and Affect: Mood and affect normal.        Behavior: Behavior is cooperative.      Diagnostic studies:    Spirometry: results normal (FEV1: 2.97/82%, FVC: 3.82/89%, FEV1/FVC: 78%).    Spirometry consistent with normal pattern.   Allergy Studies: none       Salvatore Marvel, MD  [facility]  [place]

## 2020-06-22 ENCOUNTER — Encounter: Payer: Self-pay | Admitting: Allergy & Immunology

## 2020-06-22 ENCOUNTER — Telehealth: Payer: Self-pay | Admitting: Allergy & Immunology

## 2020-06-22 NOTE — Telephone Encounter (Signed)
Patient came in on Tuesday and forgot to tell the doctor that she needed more tubing for nebulizer machine. [phone].

## 2020-06-22 NOTE — Telephone Encounter (Signed)
Called and spoke to patient to inform her that she should called [facility] to see if they can replace her tubing. Patient added she couldn't remember if she needed tubing for her nebulizer or for her CPAP. I advised her to call [facility] to try to get assistance and if she had further questions or concerns she could call our office back.  Patient agreed.

## 2020-06-26 ENCOUNTER — Telehealth: Payer: Self-pay | Admitting: *Deleted

## 2020-06-26 NOTE — Telephone Encounter (Signed)
-----   Message from Valentina Shaggy, MD sent at 06/23/2020  3:09 PM EST ----- I would be open to that. Can you call and explain the situation?  Fara Olden  ----- Message ----- From: Carin Hock, CMA Sent: 06/22/2020   3:28 PM EST To: Valentina Shaggy, MD  I am unable to find but one IgE and it is 12 which does not qualify her for Xolair and would be a low dose anyway.  Since she has failed Fasenra wonder if Dupixent would be option for her? Jaris Kohles

## 2020-06-26 NOTE — Telephone Encounter (Signed)
Called patient and advised that her IgE did not qualify her for Xolair for her asthma.  Per discussion with Dr Ernst Bowler we can try her on [place].  Per patient she wants to look into same and will reach back out to me if she wants to try [facility]

## 2020-06-28 ENCOUNTER — Other Ambulatory Visit: Payer: Self-pay

## 2020-06-28 DIAGNOSIS — U099 Post covid-19 condition, unspecified: Secondary | ICD-10-CM | POA: Insufficient documentation

## 2020-06-28 MED ORDER — XHANCE 93 MCG/ACT NA EXHU: 2.0000 | INHALANT_SUSPENSION | Freq: Two times a day (BID) | NASAL | 5 refills | Status: DC

## 2020-07-10 DIAGNOSIS — R87610 Atypical squamous cells of undetermined significance on cytologic smear of cervix (ASC-US): Secondary | ICD-10-CM | POA: Insufficient documentation

## 2020-07-23 ENCOUNTER — Other Ambulatory Visit: Payer: Self-pay | Admitting: Allergy & Immunology

## 2020-08-25 ENCOUNTER — Telehealth: Payer: Self-pay | Admitting: Allergy & Immunology

## 2020-08-25 MED ORDER — PREDNISONE 10 MG PO TABS: 10.0000 mg | ORAL_TABLET | Freq: Two times a day (BID) | ORAL | 0 refills | Status: AC

## 2020-08-25 NOTE — Telephone Encounter (Signed)
Patient contacted me to let me know that she was having intense ear pain and ringing without any other symptoms including fever, congestion, postnasal drip, or sinus pain. She is having no balance issues or difficulty ambulating.  I will send in a course of low dose prednisone for her. Patient in agreement with the plan.  Salvatore Marvel, MD [facility]

## 2020-08-26 MED ORDER — CEFDINIR 300 MG PO CAPS: 300.0000 mg | ORAL_CAPSULE | Freq: Two times a day (BID) | ORAL | 0 refills | Status: AC

## 2020-08-26 NOTE — Telephone Encounter (Signed)
Patient reported continued ear pain.  She looked in with some device connected to her iPhone and reported that it was much more erythematous than the other side.  The prednisone does not seem to have done much to touch the discomfort.  Therefore, I will send in cefdinir.  Salvatore Marvel, MD [facility] [place]

## 2020-08-26 NOTE — Addendum Note (Signed)
Addended by: Valentina Shaggy on: 08/26/2020 06:18 AM   Modules accepted: Orders

## 2020-09-19 ENCOUNTER — Ambulatory Visit: Payer: 59 | Admitting: Allergy & Immunology

## 2020-09-21 ENCOUNTER — Other Ambulatory Visit: Payer: Self-pay

## 2020-09-21 ENCOUNTER — Ambulatory Visit (INDEPENDENT_AMBULATORY_CARE_PROVIDER_SITE_OTHER): Payer: 59 | Admitting: Allergy & Immunology

## 2020-09-21 ENCOUNTER — Encounter: Payer: Self-pay | Admitting: Allergy & Immunology

## 2020-09-21 VITALS — BP 118/78 | HR 89 | Temp 98.3°F | Resp 18

## 2020-09-21 DIAGNOSIS — J4540 Moderate persistent asthma, uncomplicated: Secondary | ICD-10-CM | POA: Diagnosis not present

## 2020-09-21 DIAGNOSIS — J302 Other seasonal allergic rhinitis: Secondary | ICD-10-CM

## 2020-09-21 DIAGNOSIS — J3089 Other allergic rhinitis: Secondary | ICD-10-CM | POA: Diagnosis not present

## 2020-09-21 DIAGNOSIS — K219 Gastro-esophageal reflux disease without esophagitis: Secondary | ICD-10-CM | POA: Diagnosis not present

## 2020-09-21 DIAGNOSIS — T7800XD Anaphylactic reaction due to unspecified food, subsequent encounter: Secondary | ICD-10-CM | POA: Diagnosis not present

## 2020-09-21 DIAGNOSIS — D808 Other immunodeficiencies with predominantly antibody defects: Secondary | ICD-10-CM

## 2020-09-21 MED ORDER — LEVALBUTEROL TARTRATE 45 MCG/ACT IN AERO: 2.0000 | INHALATION_SPRAY | Freq: Four times a day (QID) | RESPIRATORY_TRACT | 2 refills | Status: DC | PRN

## 2020-09-21 NOTE — Progress Notes (Signed)
FOLLOW UP  Date of Service/Encounter:  09/21/20   Assessment:   Adverse reaction to COVID19 vaccination - clearly not IgE mediated, but she is now fully vaccinatedand needing a booster  Mild persistent asthma - well controlled onAsmanex BIDbut with worsening symptoms (initiating Xolair today)  Recurrent infections- improved following Pneumovax, but then worsened (streptococcal avidity assay abnormal)  Seasonal and perennial allergic rhinitis(trees, weeds, grasses and dust mites)-not well controlled  Gastroesophageal reflux disease- onpantoprazowe can leand famotidineand PRN carafate  Adverse food reactions -resolvedwith minimizing exposure to food additives and avoiding peanuts/tree nuts  Snoring with perceived poor sleep quality-improved with use of the CPAP  Possible POTS syndrome   Plan/Recommendations:   1. Seasonal and perennial allergic rhinitis (trees, weeds, grasses and dust mites) - Continue with: Zyrtec (cetirizine) 10mg  tablet once daily, Singulair (montelukast) 10mg  daily + Xhance two sprays per nostril twice daily.    2. Adverse food reaction (tree nuts, peanuts)  - Continue to avoid peanuts and tree nuts.  Nicole Mendoza is up to date.    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    4. Mild persistent asthma, uncomplicated - We are not going to make any medication changes at this time.  - Daily controller medication(s): Pulmicort 0.5mg  twice daily via nebulizer + Singulair 10mg   - Prior to physical activity: Xopenex 2 puffs 10-15 minutes before physical activity. - Rescue medications: Xopenex 4 puffs every 4-6 hours as needed - Changes during respiratory infections or worsening symptoms: Add on Pulmicort 0.5mg  2-4 TIMES daily for ONE TO TWO WEEKS. - Asthma control goals:  * Full participation in all desired activities (may need albuterol before activity) * Albuterol use two time or less a week on  average (not counting use with activity) * Cough interfering with sleep two time or less a month * Oral steroids no more than once a year * No hospitalizations  5. Return in about 6 months (around 03/23/2021).   Subjective:   Nicole Mendoza is a 30 y.o. female presenting today for follow up of  Chief Complaint  Patient presents with  . Asthma    Nicole Mendoza has a history of the following: Patient Active Problem List   Diagnosis Date Noted  . Body aches 10/05/2019  . Muscle pain 10/05/2019  . Physical deconditioning 10/05/2019  . Palpitations 09/02/2019  . Cardiac murmur 09/02/2019  . History of 2019 novel coronavirus disease (COVID-19) 09/02/2019  . History of COVID-19 09/01/2019  . Tachycardia 09/01/2019  . Salivary stone 09/01/2019  . Thrush 09/01/2019  . Fatigue 09/01/2019  . Asthma 09/01/2019  . Irregular bowel habits 07/29/2019  . 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 Seasonal and perennial allergic rhinitis 01/22/2019  . Morbid obesity with BMI of 45.0-49.9, adult ([place]) 10/28/2017  . Moderate persistent asthma without complication 60/03/9322  . Mild persistent asthma without complication 55/73/2202  . Essential hypertension 04/22/2017  . Dysmenorrhea 06/26/2016  . Menorrhagia with regular cycle 06/26/2016  . Gastroesophageal reflux disease 12/22/2015  . Current use of beta blocker 12/22/2015  . Asthma with acute exacerbation 05/09/2015  . Acute maxillary sinusitis 05/09/2015  . Hypertension 05/09/2015  . Allergic rhinitis 11/02/2014  . Iron deficiency anemia 11/02/2014  . Mild intermittent asthma without complication 54/27/0623  . Morbid obesity due to excess calories ([place]) 11/02/2014  . Vitamin D deficiency 11/02/2014  . Strain of back 03/25/2014    History obtained from: chart review and patient.  Nicole Mendoza is a  30 y.o. female presenting for a follow up visit.  She has a very complicated history but is well-known to this practice.  She was  last seen in January 2022.  At that time, we continued Zyrtec and Singulair as well as XHANCE for her allergic rhinitis.  She was interested in starting allergen immunotherapy.  However, she was quite worried about having a reaction so we discussed starting Xolair.  For her asthma, we continued with her Asmanex.  She also has Pulmicort that she has during periods of asthma flares.  We did work on getting Xolair approved, but unfortunately her IgE was only 12 so that was not going to be approved.  We looked into Dupixent as well, but she never called [name] back about doing this.  She continues to avoid peanuts and tree nuts.  We made sure her Auvi-Q was up-to-date.  Her specific antibody deficiency was controlled with azithromycin 3 times weekly.  Since last visit, she has done rather well.  She tells me that she actually had couple of illnesses run through her family but she did not catch them.  This is certainly a first for her.  Asthma/Respiratory Symptom History: She remains on Asmanex two puffs BID every day. She does report some SOB and she thinks that there is a weight component to it. she has signed up for weight management. She remains open to starting [place], would like to read more about it.  Allergic Rhinitis Symptom History: She is using her cetirizine. She does have some congestion with intermittent drainage. It is not affecting her quality of life. She is on the [place]. She continues on the free delivery program. She has a surplus of ten unopened.  We have discussed doing allergy shots, but she does not think she has the bandwidth for that right now.  Food Allergy Symptom History: She actually would like to introduce some tree nuts into her diet.  She knows she needs to definitely avoid peanuts.  She was positive to pecan so she does not want to attempt to eat those.  She would like to eat cashews again.  Review of her testing from blood work and skin testing shows that she was never reactive  to cashews at all.  She does need the COVID-19 booster.  She would like to get it at our office.  Her previous reactions have included 1 week of GI distress including nausea and vomiting.  She thinks she might be able to get through it with some Zofran.  Otherwise, there have been no changes to her past medical history, surgical history, family history, or social history.    Review of Systems  Constitutional: Negative.  Negative for fever, malaise/fatigue and weight loss.  HENT: Negative.  Negative for congestion, ear discharge and ear pain.   Eyes: Negative for pain, discharge and redness.  Respiratory: Negative for cough, sputum production, shortness of breath and wheezing.   Cardiovascular: Negative.  Negative for chest pain and palpitations.  Gastrointestinal: Negative for abdominal pain and heartburn.  Skin: Negative.  Negative for itching and rash.  Neurological: Negative for dizziness and headaches.  Endo/Heme/Allergies: Negative for environmental allergies. Does not bruise/bleed easily.       Objective:   Blood pressure 118/78, pulse 89, temperature 98.3 F (36.8 C), temperature source Temporal, resp. rate 18, SpO2 98 %. There is no height or weight on file to calculate BMI.   Physical Exam:  Physical Exam Constitutional:      Appearance: She is  well-developed.     Comments: She actually looks quite good today.  HENT:     Head: Normocephalic and atraumatic.     Right Ear: Tympanic membrane, ear canal and external ear normal.     Left Ear: Tympanic membrane, ear canal and external ear normal.     Nose: No nasal deformity, septal deviation, mucosal edema or rhinorrhea.     Right Turbinates: Enlarged and swollen.     Left Turbinates: Enlarged and swollen.     Right Sinus: No maxillary sinus tenderness or frontal sinus tenderness.     Left Sinus: No maxillary sinus tenderness or frontal sinus tenderness.     Comments: Scant clear mucus.    Mouth/Throat:     Mouth:  Mucous membranes are not pale and not dry.     Pharynx: Uvula midline.  Eyes:     General:        Right eye: No discharge.        Left eye: No discharge.     Conjunctiva/sclera: Conjunctivae normal.     Right eye: Right conjunctiva is not injected. No chemosis.    Left eye: Left conjunctiva is not injected. No chemosis.    Pupils: Pupils are equal, round, and reactive to light.  Cardiovascular:     Rate and Rhythm: Normal rate and regular rhythm.     Heart sounds: Normal heart sounds.  Pulmonary:     Effort: Pulmonary effort is normal. No tachypnea, accessory muscle usage or respiratory distress.     Breath sounds: Normal breath sounds. No wheezing, rhonchi or rales.     Comments: Moving air well in all lung fields. Chest:     Chest wall: No tenderness.  Lymphadenopathy:     Cervical: No cervical adenopathy.  Skin:    General: Skin is warm.     Capillary Refill: Capillary refill takes less than 2 seconds.     Coloration: Skin is not pale.     Findings: No abrasion, erythema, petechiae or rash. Rash is not papular, urticarial or vesicular.     Comments: Flushed skin over her face and neck.  Neurological:     Mental Status: She is alert.  Psychiatric:        Behavior: Behavior is cooperative.      Diagnostic studies: none    Salvatore Marvel, MD  [facility]

## 2020-09-21 NOTE — Patient Instructions (Addendum)
1. Seasonal and perennial allergic rhinitis (trees, weeds, grasses and dust mites) - Continue with: Zyrtec (cetirizine) 10mg  tablet once daily, Singulair (montelukast) 10mg  daily + Xhance two sprays per nostril twice daily.    2. Adverse food reaction (tree nuts, peanuts)  - Continue to avoid peanuts and tree nuts.  Nicole Mendoza is up to date.    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    4. Mild persistent asthma, uncomplicated - We are not going to make any medication changes at this time.  - Daily controller medication(s): Pulmicort 0.5mg  twice daily via nebulizer + Singulair 10mg   - Prior to physical activity: Xopenex 2 puffs 10-15 minutes before physical activity. - Rescue medications: Xopenex 4 puffs every 4-6 hours as needed - Changes during respiratory infections or worsening symptoms: Add on Pulmicort 0.5mg  2-4 TIMES daily for ONE TO TWO WEEKS. - Asthma control goals:  * Full participation in all desired activities (may need albuterol before activity) * Albuterol use two time or less a week on average (not counting use with activity) * Cough interfering with sleep two time or less a month * Oral steroids no more than once a year * No hospitalizations  5. Return in about 6 months (around 03/23/2021).    Please inform us of any Emergency Department visits, hospitalizations, or changes in symptoms. Call us before going to the ED for breathing or allergy symptoms since we might be able to fit you in for a sick visit. Feel free to contact us anytime with any questions, problems, or concerns.  It was a pleasure to see you again today!  Websites that have reliable patient information: 1. American Academy of Asthma, Allergy, and Immunology: www.aaaai.org 2. Food Allergy Research and Education (FARE): foodallergy.org 3. Mothers of Asthmatics: http://www.asthmacommunitynetwork.org 4. American College of Allergy, Asthma, and Immunology:  www.acaai.org   COVID-19 Vaccine Information can be found at: ShippingScam.co.uk For questions related to vaccine distribution or appointments, please email [email or web address] or call [phone].   We realize that you might be concerned about having an allergic reaction to the COVID19 vaccines. To help with that concern, WE ARE OFFERING THE COVID19 VACCINES IN OUR OFFICE! Ask the front desk for dates!     "Like" [place] on Facebook and Instagram for our latest updates!      A healthy democracy works best when [employer] participate! Make sure you are registered to vote! If you have moved or changed any of your contact information, you will need to get this updated before voting!  In some cases, you MAY be able to register to vote online: [email or web address]

## 2020-09-25 ENCOUNTER — Telehealth: Payer: Self-pay | Admitting: Allergy & Immunology

## 2020-09-25 ENCOUNTER — Other Ambulatory Visit: Payer: Self-pay

## 2020-09-25 MED ORDER — LEVALBUTEROL HCL 1.25 MG/3ML IN NEBU: INHALATION_SOLUTION | RESPIRATORY_TRACT | 1 refills | Status: DC

## 2020-09-25 MED ORDER — NYSTATIN 100000 UNIT/ML MT SUSP: 5.0000 mL | Freq: Four times a day (QID) | OROMUCOSAL | 0 refills | Status: AC

## 2020-09-25 MED ORDER — LEVALBUTEROL TARTRATE 45 MCG/ACT IN AERO: 2.0000 | INHALATION_SPRAY | Freq: Four times a day (QID) | RESPIRATORY_TRACT | 2 refills | Status: DC | PRN

## 2020-09-25 NOTE — Telephone Encounter (Signed)
Xopenex inhaler [facility]  [place] and was wondering if she can have something for thrust please advise to sending in diflucan

## 2020-09-25 NOTE — Telephone Encounter (Signed)
Please advise to diflucan

## 2020-09-25 NOTE — Telephone Encounter (Signed)
Pt request prescription for xopenex be sent to [facility], and pt also states she has thrush in her mouth and throat.

## 2020-09-25 NOTE — Telephone Encounter (Signed)
Per Dr. Ernst Bowler sending in Nystatin. Levalbuterol refilled as well.

## 2020-09-25 NOTE — Telephone Encounter (Signed)
Informed pt of the nystatin being sent in

## 2020-09-25 NOTE — Telephone Encounter (Signed)
I talked to [facility] and we are sending in nystatin.  Salvatore Marvel, MD [facility] [place] [facility] [place]

## 2020-09-26 MED ORDER — PREDNISONE 10 MG PO TABS: ORAL_TABLET | ORAL | 0 refills | Status: DC

## 2020-09-26 NOTE — Telephone Encounter (Signed)
Patient reported that she was having increased work of breathing despite the liberal use of her nebulizer treatments. She continues to have clear discharge. I sent in prednisone for now and she is going to let me know how this works.  Salvatore Marvel, MD [facility] [place] [facility] [place]

## 2020-09-26 NOTE — Addendum Note (Signed)
Addended by: Valentina Shaggy on: 09/26/2020 08:38 AM   Modules accepted: Orders

## 2020-09-30 ENCOUNTER — Other Ambulatory Visit: Payer: Self-pay | Admitting: Allergy

## 2020-09-30 MED ORDER — DOXYCYCLINE HYCLATE 100 MG PO CAPS: 100.0000 mg | ORAL_CAPSULE | Freq: Two times a day (BID) | ORAL | 0 refills | Status: AC

## 2020-09-30 MED ORDER — PREDNISONE 20 MG PO TABS: 20.0000 mg | ORAL_TABLET | Freq: Two times a day (BID) | ORAL | 0 refills | Status: AC

## 2020-09-30 NOTE — Progress Notes (Signed)
Called by pt.   She states she is trying to avoid ED.  On Last Saturday (a week ago) states she started having chest tightness and coughing. She states her husband has been sick as well.  She started pulmicort neb and xopenex neb.  On Tuesday she was prescribed prednisone by Dr. Ernst Bowler.  On Wed she was started on cefdinir.   She had neg Covid test.   She states she does take azithromycin 3 days a week as a prophylactic.   She also has been taking mucinex to help with mucus loosening.  Yesterday she had increase in nasal congestion with thick green mucus.  Has not been able to do sinus rinse as it wont flow.  She is now coughing up thick green mucus.   O2 sats 97-99%.  She states she is getting more short of breath however.  She is a [employer] scientist and used her stethoscope to listen to chest and states could hear squeaks.  Advised to do xopenex neb q4h while awake.  Continue mucinex.  Continue her routine medication.  Will change/extend prednisone for 20mg  BID x 7 days (from Tuesday).  Will change cefdinir to doxycycline since worsening symptoms.  Advised if O2 not >92% she needs to to ED for evaluation or if worsening symptoms.   She voiced understanding.

## 2020-10-02 ENCOUNTER — Encounter: Payer: Self-pay | Admitting: Allergy

## 2020-10-02 ENCOUNTER — Other Ambulatory Visit: Payer: Self-pay

## 2020-10-02 ENCOUNTER — Ambulatory Visit (INDEPENDENT_AMBULATORY_CARE_PROVIDER_SITE_OTHER): Payer: 59 | Admitting: Family Medicine

## 2020-10-02 VITALS — BP 142/88 | HR 96 | Temp 98.7°F | Resp 20

## 2020-10-02 DIAGNOSIS — K219 Gastro-esophageal reflux disease without esophagitis: Secondary | ICD-10-CM

## 2020-10-02 DIAGNOSIS — J4541 Moderate persistent asthma with (acute) exacerbation: Secondary | ICD-10-CM | POA: Diagnosis not present

## 2020-10-02 DIAGNOSIS — B999 Unspecified infectious disease: Secondary | ICD-10-CM

## 2020-10-02 DIAGNOSIS — J302 Other seasonal allergic rhinitis: Secondary | ICD-10-CM

## 2020-10-02 DIAGNOSIS — T7800XD Anaphylactic reaction due to unspecified food, subsequent encounter: Secondary | ICD-10-CM | POA: Diagnosis not present

## 2020-10-02 DIAGNOSIS — J3089 Other allergic rhinitis: Secondary | ICD-10-CM

## 2020-10-02 MED ORDER — PREDNISONE 10 MG PO TABS: ORAL_TABLET | ORAL | 0 refills | Status: DC

## 2020-10-02 NOTE — Progress Notes (Addendum)
[address] [place] [address] Dept: [phone]  FOLLOW UP NOTE  Patient ID: Nicole Mendoza, female    DOB: 1991-06-13  Age: 30 y.o. MRN: [id number] Date of Office Visit: 10/02/2020  Assessment  Chief Complaint: Asthma (Asthma allergy flare)  HPI Nicole Mendoza is a 30 year old female who presents to the clinic for urgent evaluation of acute asthma exacerbation. She was last seen in this clinic on 09/21/2020 by Dr. Ernst Bowler for evaluation of asthma, allergic rhinitis, recurrent infections on azithromycin three days a week, reflux, and food allergy to peanuts and tree nuts. In the interim, she reports that she began to feel chest tightness and cough beginning on Saturday during [place]. She reports there was a smell in the condo that was aggravating her asthma. When she returned home on Sunday, she began to use budesonide via nebulizer and xopenex via nebulizer in addition to Pulmicort Flexhaler twice a day and montelukast 10 mg once a day. She was started on cefdinir and later switched to doxycycline in addition to an extended prednisone taper for treatment of thick green nasal drainage and asthma exacerbation. At today's visit, she reports that, her asthma has been poorly controlled with symptoms including shortness of breath which worsens with any movement, wheeze occurring during the day and at nighttime, and cough which is both dry and producing thick sputum. She is sleeping with the head of her bed raised at this time. She continues budesonide 0.5 mg via nebulizer twice a day, xopenex once every 4 hours, as well as  Pulmicort Flexhaler twice a day. She continues an extended prednisone taper as well. She does report that, today, she began to feel the prednisone and doxycycline begin to decrease her symptoms. Allergic rhinitis is reported as poorly controlled with thick green nasal drainage for which she continues nasal saline rinses, Xhance twice a day, and cetirizine daily.  She continues doxycycline at this time with report of slightly improving symptoms. Reflux is reported as not well controlled with heartburn occurring a few times a week despite reported compliance with Protonix and famotidine. She reports frequent sinus infections requiring antibiotics and prednisone. At this time, she is agreeable to flexibility within her therapy plan to possibly at some point include immunoglobulin replacement, allergen immunotherapy and biologic treatment for asthma. Her current medications are listed in the chart.   Drug Allergies:  Allergies  Allergen Reactions  . Budesonide-Formoterol Fumarate Other (See Comments)    Causes Hypertension  . Penicillins Hives  . Albuterol Palpitations  . Losartan Rash    Physical Exam: BP (!) 142/88   Pulse 96   Temp 98.7 F (37.1 C) (Temporal)   Resp 20   SpO2 100%    Physical Exam Vitals reviewed.  Constitutional:      Appearance: Normal appearance.  HENT:     Head: Normocephalic and atraumatic.  Neurological:     Mental Status: She is alert.    Diagnostics: FVC 3.39, FEV1 2.72.  Predicted FVC 4.36, predicted FEV1 3.66.  Spirometry indicates mild restriction.  Assessment and Plan: 1. Moderate persistent asthma with acute exacerbation   2. Anaphylactic shock due to food, subsequent encounter   3. Gastroesophageal reflux disease, unspecified whether esophagitis present   4. Seasonal and perennial allergic rhinitis   5. Recurrent infections     Meds ordered this encounter  Medications  . predniSONE (DELTASONE) 10 MG tablet    Sig: 2 tablets twice a ay for 4 days then  2 tablets for 2 days then 1 tablet on the last day    Dispense:  21 tablet    Refill:  0    Patient Instructions  Asthma Continue montelukast 10 mg once a day to prevent cough or wheeze Continue Asthmanex 220- 2 puffs twice a day to prevent cough or wheeze For now, and for asthma flares, continue Pulmicort 0.5 mg twice a day via nebulizer for 2  weeks or until cough and wheeze free Continue prednisone 20 mg twice a day for the next 4 days, then take 20 mg once a day for 2 days, then take 10 mg on the last day, then stop  Allergic rhinitis Continue allergen avoidance measures directed toward pollens and dust mites as listed below Continue saline rinses several times a day Stop cetirizine for one week, then restart cetirizine 10 mg once a day as needed for a runny nose or itch Continue Xhance 2 sprays in each nostril twice a day for nasal symptoms  Acute sinusitis Continue nasal saline rinses and steroid nasal spray as above Continue doxycycline as previously directed  Recurrent infections Continue azithromycin three days a week Continue to keep track of infections We will consider immune globulin replacement therapy. We will be in touch with out biologics coordinator [name] regarding this medication.   Reflux Continue pantoprazole and famotidine as directed Continue dietary and lifestyle modifications as listed below  Food allergy Continue to avoid peanuts and tree nuts.  In case of an allergic reaction, take Benadryl 50 mg every 4 hours, and if life-threatening symptoms occur, inject with AuviQ 0.3 mg.  Call the clinic if this treatment plan is not working well for you  Follow up in 2 weeks or sooner if needed.   Return in about 2 weeks (around 10/16/2020), or if symptoms worsen or fail to improve.    Thank you for the opportunity to care for this patient.  Please do not hesitate to contact me with questions.  Gareth Morgan, FNP [facility] [place] [facility] [place]

## 2020-10-02 NOTE — Progress Notes (Signed)
I hear from her over the weekend as well.  She did send me an email to update me on how she is doing.  She has been hesitant to start immunoglobulin replacement in the past, but I think now the time has come.  [name], would you mind reaching out to her to discuss the process?  Lets do Cuvitru 400 mg/kg/month equivalent every 2 weeks - approximately 26 g every 2 weeks.   Salvatore Marvel, MD [facility] [place] [facility] [place]

## 2020-10-02 NOTE — Patient Instructions (Addendum)
Asthma Continue montelukast 10 mg once a day to prevent cough or wheeze Continue Asthmanex 220- 2 puffs twice a day to prevent cough or wheeze For now, and for asthma flares, continue Pulmicort 0.5 mg twice a day via nebulizer for 2 weeks or until cough and wheeze free Continue prednisone 20 mg twice a day for the next 4 days, then take 20 mg once a day for 2 days, then take 10 mg on the last day, then stop  Allergic rhinitis Continue allergen avoidance measures directed toward pollens and dust mites as listed below Continue saline rinses several times a day Stop cetirizine for one week, then restart cetirizine 10 mg once a day as needed for a runny nose or itch Continue Xhance 2 sprays in each nostril twice a day for nasal symptoms  Acute sinusitis Continue nasal saline rinses and steroid nasal spray as above Continue doxycycline as previously directed  Recurrent infections Continue azithromycin three days a week Continue to keep track of infections We will consider immune globulin replacement therapy. We will be in touch with out biologics coordinator [name] regarding this medication.   Reflux Continue pantoprazole and famotidine as directed Continue dietary and lifestyle modifications as listed below  Food allergy Continue to avoid peanuts and tree nuts.  In case of an allergic reaction, take Benadryl 50 mg every 4 hours, and if life-threatening symptoms occur, inject with AuviQ 0.3 mg.  Call the clinic if this treatment plan is not working well for you  Follow up in 2 weeks or sooner if needed.

## 2020-10-03 ENCOUNTER — Telehealth: Payer: Self-pay | Admitting: Family Medicine

## 2020-10-03 ENCOUNTER — Ambulatory Visit (HOSPITAL_BASED_OUTPATIENT_CLINIC_OR_DEPARTMENT_OTHER)
Admission: RE | Admit: 2020-10-03 | Discharge: 2020-10-03 | Disposition: A | Discharge status: Home / Self Care | Payer: 59 | Source: Ambulatory Visit | Attending: Family Medicine | Admitting: Family Medicine

## 2020-10-03 DIAGNOSIS — R053 Chronic cough: Secondary | ICD-10-CM | POA: Diagnosis present

## 2020-10-03 NOTE — Progress Notes (Signed)
Can you please call this patient and let her know her chest xray was normal? Thank you

## 2020-10-03 NOTE — Telephone Encounter (Signed)
Chest xray is ordered and pt informed

## 2020-10-03 NOTE — Telephone Encounter (Signed)
Stat cxr please. Thank you

## 2020-10-03 NOTE — Telephone Encounter (Signed)
Patients states she is still having ongoing issues was seen on 10-02-2020 asking for a chest xray please advise

## 2020-10-03 NOTE — Telephone Encounter (Signed)
Please advise to chest xray for pt she was seen yesterday

## 2020-10-04 ENCOUNTER — Other Ambulatory Visit: Payer: Self-pay | Admitting: Family Medicine

## 2020-10-04 ENCOUNTER — Other Ambulatory Visit: Payer: Self-pay

## 2020-10-04 ENCOUNTER — Encounter: Payer: Self-pay | Admitting: Family Medicine

## 2020-10-04 ENCOUNTER — Ambulatory Visit (INDEPENDENT_AMBULATORY_CARE_PROVIDER_SITE_OTHER): Payer: 59 | Admitting: Family Medicine

## 2020-10-04 VITALS — BP 146/74 | HR 93 | Temp 100.6°F

## 2020-10-04 DIAGNOSIS — J3089 Other allergic rhinitis: Secondary | ICD-10-CM

## 2020-10-04 DIAGNOSIS — K219 Gastro-esophageal reflux disease without esophagitis: Secondary | ICD-10-CM | POA: Diagnosis not present

## 2020-10-04 DIAGNOSIS — J4541 Moderate persistent asthma with (acute) exacerbation: Secondary | ICD-10-CM | POA: Diagnosis not present

## 2020-10-04 DIAGNOSIS — T7800XD Anaphylactic reaction due to unspecified food, subsequent encounter: Secondary | ICD-10-CM

## 2020-10-04 DIAGNOSIS — B999 Unspecified infectious disease: Secondary | ICD-10-CM

## 2020-10-04 DIAGNOSIS — J302 Other seasonal allergic rhinitis: Secondary | ICD-10-CM

## 2020-10-04 MED ORDER — METHYLPREDNISOLONE ACETATE 80 MG/ML IJ SUSP
80.0000 mg | Freq: Once | INTRAMUSCULAR | Status: AC
  Administered 2020-10-04: 80 mg via INTRAMUSCULAR

## 2020-10-04 MED ORDER — METHYLPREDNISOLONE ACETATE 80 MG/ML IJ SUSP: 80.0000 mg | Freq: Once | INTRAMUSCULAR | Status: DC

## 2020-10-04 NOTE — Telephone Encounter (Signed)
Patient called back with an update after being seen this morning. She has had a rough afternoon with wheezing and chest tightness. She did a treatment and still wheezing, and has done everything as prescribed/directed.   Please advise.

## 2020-10-04 NOTE — Progress Notes (Addendum)
[address] [place] [address] Dept: [phone]  FOLLOW UP NOTE  Patient ID: Nicole Mendoza, female    DOB: 1990-11-03  Age: 30 y.o. MRN: [id number] Date of Office Visit: 10/04/2020  Assessment  Chief Complaint: Cough (Chest X-ray was good)  HPI Nicole Mendoza is a 30 year old female who presents to the clinic for follow-up visit.  She was last seen in this clinic 10/02/2020 for evaluation of asthma with acute exacerbation and acute sinusitis.  In the interim, she had a chest x-ray yesterday that indicated, " lungs are clear.  No pleural effusion or pneumothorax.  Heart is normal in size.  Visualized osseous structures are within normal limits".  At today's visit, she reports her asthma has been somewhat more controlled with continued shortness of breath with activity, wheezing continues however, is reduced, and cough is persistent and has started producing thick clear mucus.  She does report less chest tightness today as well.  She continues Pulmicort 0.5 mg twice a day via nebulizer, montelukast 10 mg once a day, Pulmicort Flexhaler twice a day, and Xopenex once every 4 hours.  Allergic rhinitis is reported as moderately well controlled with nasal drainage decreasing and becoming more clear than green.  She continues nasal saline rinses twice a day and has recently started taking Mucinex.  She continues doxycycline for acute sinusitis with her last dose on Saturday.  She continues a prednisone taper with the last dose on Monday.  Reflux is reported as moderately well controlled with occasional heartburn occurring for which she continues pantoprazole and famotidine.  She continues to avoid peanuts and tree nuts with no accidental ingestion or EpiPen use since her last visit to this clinic.  Her current medications are listed in the chart.   Drug Allergies:  Allergies  Allergen Reactions  . Budesonide-Formoterol Fumarate Other (See Comments)    Causes Hypertension  . Penicillins Hives   . Albuterol Palpitations  . Losartan Rash    Physical Exam: BP (!) 146/74 (BP Location: Right Arm, Patient Position: Sitting, Cuff Size: Large)   Pulse 93   Temp (!) 100.6 F (38.1 C) (Temporal)   LMP 09/19/2020   SpO2 97%    Physical Exam Vitals reviewed.  Constitutional:      Appearance: Normal appearance.  HENT:     Head: Normocephalic and atraumatic.     Right Ear: Tympanic membrane normal.     Left Ear: Tympanic membrane normal.     Ears:     Comments: Right ear with clear effusion.  Bilateral TMs normal    Nose:     Comments: Lateral nares slightly erythematous with clear nasal drainage noted.  Slight septal deviation noted.  Pharynx slightly erythematous with no exudate.  Eyes normal Eyes:     Conjunctiva/sclera: Conjunctivae normal.  Cardiovascular:     Rate and Rhythm: Normal rate and regular rhythm.     Heart sounds: Normal heart sounds. No murmur heard.   Pulmonary:     Effort: Pulmonary effort is normal.     Comments: Slight expiratory wheeze noted Musculoskeletal:        General: Normal range of motion.     Cervical back: Normal range of motion and neck supple.  Skin:    General: Skin is warm and dry.  Neurological:     Mental Status: She is alert and oriented to person, place, and time.  Psychiatric:        Mood and Affect: Mood normal.  Behavior: Behavior normal.        Thought Content: Thought content normal.        Judgment: Judgment normal.     Diagnostics: FVC 3.40, FEV1 2.77.  Predicted FVC 4.36, predicted FEV1 3.66.  Spirometry indicates restriction.  This is consistent with previous spirometry readings.  Assessment and Plan: 1. Moderate persistent asthma with acute exacerbation   2. Gastroesophageal reflux disease, unspecified whether esophagitis present   3. Seasonal and perennial allergic rhinitis   4. Recurrent infections   5. Anaphylactic shock due to food, subsequent encounter     Patient Instructions  Asthma Continue  montelukast 10 mg once a day to prevent cough or wheeze Continue Asthmanex 220- 2 puffs twice a day to prevent cough or wheeze For now, and for asthma flares, continue Pulmicort 0.5 mg twice a day via nebulizer for 2 weeks or until cough and wheeze free Continue your current prednisone taper as previously written Call the clinic for any worsening of symptoms or if your symptoms are not improving   Allergic rhinitis Continue allergen avoidance measures directed toward pollens and dust mites as listed below Continue saline rinses several times a day Stop cetirizine for one week, then restart cetirizine 10 mg once a day as needed for a runny nose or itch Continue Xhance 2 sprays in each nostril twice a day for nasal symptoms  Acute sinusitis Continue nasal saline rinses and steroid nasal spray as above Continue doxycycline as previously directed  Recurrent infections Continue azithromycin three days a week Continue to keep track of infections We will consider immune globulin replacement therapy. We will be in touch with out biologics coordinator [name] regarding this medication.   Reflux Continue pantoprazole and famotidine as directed Continue dietary and lifestyle modifications as listed below  Food allergy Continue to avoid peanuts and tree nuts.  In case of an allergic reaction, take Benadryl 50 mg every 4 hours, and if life-threatening symptoms occur, inject with AuviQ 0.3 mg.  Call the clinic if this treatment plan is not working well for you  Follow up in 2 weeks or sooner if needed.   Return in about 2 weeks (around 10/18/2020), or if symptoms worsen or fail to improve.    Thank you for the opportunity to care for this patient.  Please do not hesitate to contact me with questions.  Gareth Morgan, FNP [facility] [place] [facility] [place]

## 2020-10-04 NOTE — Telephone Encounter (Signed)
Patient reports an episode of wheezing and chest tightness after returning home from our clinic today. She reports that she is not able to take LABA or LAMA's due to tachycardia issues in the past. She is agreeable to a depo-medrol injection at the [facility] which is closer to her home than [facility]. We discussed the need for her to go to the ED for further evaluation if she experiences an additional attack despite the depo-medrol injection. She verbalized understanding. She reports that she has had a steroid injection in the past and has tolerated this well. She will call with any further questions or if her symptoms worsen.

## 2020-10-04 NOTE — Addendum Note (Signed)
Addended by: Felipa Emory on: 10/04/2020 04:59 PM   Modules accepted: Orders

## 2020-10-04 NOTE — Patient Instructions (Signed)
Asthma Continue montelukast 10 mg once a day to prevent cough or wheeze Continue Asthmanex 220- 2 puffs twice a day to prevent cough or wheeze For now, and for asthma flares, continue Pulmicort 0.5 mg twice a day via nebulizer for 2 weeks or until cough and wheeze free Continue your current prednisone taper as previously written Call the clinic for any worsening of symptoms or if your symptoms are not improving   Allergic rhinitis Continue allergen avoidance measures directed toward pollens and dust mites as listed below Continue saline rinses several times a day Stop cetirizine for one week, then restart cetirizine 10 mg once a day as needed for a runny nose or itch Continue Xhance 2 sprays in each nostril twice a day for nasal symptoms  Acute sinusitis Continue nasal saline rinses and steroid nasal spray as above Continue doxycycline as previously directed  Recurrent infections Continue azithromycin three days a week Continue to keep track of infections We will consider immune globulin replacement therapy. We will be in touch with out biologics coordinator [name] regarding this medication.   Reflux Continue pantoprazole and famotidine as directed Continue dietary and lifestyle modifications as listed below  Food allergy Continue to avoid peanuts and tree nuts.  In case of an allergic reaction, take Benadryl 50 mg every 4 hours, and if life-threatening symptoms occur, inject with AuviQ 0.3 mg.  Call the clinic if this treatment plan is not working well for you  Follow up in 2 weeks or sooner if needed.

## 2020-10-04 NOTE — Addendum Note (Signed)
Addended by: Isabel Caprice on: 10/04/2020 04:19 PM   Modules accepted: Orders

## 2020-10-05 ENCOUNTER — Emergency Department (HOSPITAL_BASED_OUTPATIENT_CLINIC_OR_DEPARTMENT_OTHER)
Admission: EM | Admit: 2020-10-05 | Discharge: 2020-10-05 | Disposition: A | Discharge status: Home / Self Care | Payer: 59 | Attending: Emergency Medicine | Admitting: Emergency Medicine

## 2020-10-05 ENCOUNTER — Encounter (HOSPITAL_BASED_OUTPATIENT_CLINIC_OR_DEPARTMENT_OTHER): Payer: Self-pay | Admitting: Emergency Medicine

## 2020-10-05 DIAGNOSIS — I10 Essential (primary) hypertension: Secondary | ICD-10-CM | POA: Insufficient documentation

## 2020-10-05 DIAGNOSIS — Z8616 Personal history of COVID-19: Secondary | ICD-10-CM | POA: Insufficient documentation

## 2020-10-05 DIAGNOSIS — Z79899 Other long term (current) drug therapy: Secondary | ICD-10-CM | POA: Diagnosis not present

## 2020-10-05 DIAGNOSIS — R69 Illness, unspecified: Secondary | ICD-10-CM | POA: Diagnosis not present

## 2020-10-05 DIAGNOSIS — Z7951 Long term (current) use of inhaled steroids: Secondary | ICD-10-CM | POA: Insufficient documentation

## 2020-10-05 DIAGNOSIS — T445X5A Adverse effect of predominantly beta-adrenoreceptor agonists, initial encounter: Secondary | ICD-10-CM | POA: Diagnosis not present

## 2020-10-05 DIAGNOSIS — J4540 Moderate persistent asthma, uncomplicated: Secondary | ICD-10-CM | POA: Diagnosis not present

## 2020-10-05 DIAGNOSIS — T50905A Adverse effect of unspecified drugs, medicaments and biological substances, initial encounter: Secondary | ICD-10-CM

## 2020-10-05 NOTE — ED Provider Notes (Signed)
[facility] EMERGENCY DEPARTMENT Provider Note   CSN: [id number] Arrival date & time: 10/05/20  0411     History Chief Complaint  Patient presents with  . Spasms    Nicole Mendoza is a 30 y.o. female.  The history is provided by the patient.  Illness Location:  Body  Quality:  Feels jittery and tense  Severity:  Moderate Onset quality:  Sudden Timing:  Constant Progression:  Unchanged Chronicity:  New Context:  On xopenex and oral steroids and then received a steroid injection and now feels jittery.   Relieved by:  Nothing Worsened by:  More medications  Ineffective treatments:  None  Associated symptoms: no abdominal pain, no chest pain, no congestion, no cough, no diarrhea, no ear pain, no fatigue, no fever, no headaches, no loss of consciousness, no myalgias, no nausea, no rash, no rhinorrhea, no shortness of breath, no sore throat, no vomiting and no wheezing        Past Medical History:  Diagnosis Date  . Allergic rhinitis   . Angio-edema   . Asthma   . Fatty liver   . GERD (gastroesophageal reflux disease)   . Hypertension   . Recurrent upper respiratory infection (URI)   . Tachycardia     Patient Active Problem List   Diagnosis Date Noted  . Recurrent infections 10/02/2020  . Body aches 10/05/2019  . Muscle pain 10/05/2019  . Physical deconditioning 10/05/2019  . Palpitations 09/02/2019  . Cardiac murmur 09/02/2019  . History of 2019 novel coronavirus disease (COVID-19) 09/02/2019  . History of COVID-19 09/01/2019  . Tachycardia 09/01/2019  . Salivary stone 09/01/2019  . Thrush 09/01/2019  . Fatigue 09/01/2019  . Asthma 09/01/2019  . Irregular bowel habits 07/29/2019  . 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 Seasonal and perennial allergic rhinitis 01/22/2019  . Morbid obesity with BMI of 45.0-49.9, adult ([place]) 10/28/2017  . Moderate persistent asthma without complication 14/43/1540  . Mild persistent asthma  without complication 08/67/6195  . Essential hypertension 04/22/2017  . Dysmenorrhea 06/26/2016  . Menorrhagia with regular cycle 06/26/2016  . Gastroesophageal reflux disease 12/22/2015  . Current use of beta blocker 12/22/2015  . Moderate persistent asthma with acute exacerbation 05/09/2015  . Acute maxillary sinusitis 05/09/2015  . Hypertension 05/09/2015  . Allergic rhinitis 11/02/2014  . Iron deficiency anemia 11/02/2014  . Mild intermittent asthma without complication 09/32/6712  . Morbid obesity due to excess calories ([place]) 11/02/2014  . Vitamin D deficiency 11/02/2014  . Strain of back 03/25/2014    Past Surgical History:  Procedure Laterality Date  . NO PAST SURGERIES       OB History   No obstetric history on file.     Family History  Problem Relation Age of Onset  . Allergic rhinitis Father   . Asthma Father   . Allergic rhinitis Sister   . Asthma Sister   . Angioedema Neg Hx   . Atopy Neg Hx   . Eczema Neg Hx   . Immunodeficiency Neg Hx   . Urticaria Neg Hx     Social History   Tobacco Use  . Smoking status: Never Smoker  . Smokeless tobacco: Never Used  Vaping Use  . Vaping Use: Never used  Substance Use Topics  . Alcohol use: No    Alcohol/week: 0.0 standard drinks  . Drug use: No    Home Medications Prior to Admission medications   Medication Sig Start Date End Date Taking? Authorizing Provider  Ascorbic Acid (SUPER C COMPLEX PO)  07/19/19   [provider]  azithromycin (ZITHROMAX) 500 MG tablet Take by mouth. 10/02/20   [provider]  budesonide (PULMICORT FLEXHALER) 180 MCG/ACT inhaler Inhale 2 puffs into the lungs in the morning and at bedtime. 05/02/20   Valentina Shaggy, MD  cetirizine (ZYRTEC) 10 MG tablet Take by mouth.    [provider]  doxycycline (VIBRAMYCIN) 100 MG capsule Take 1 capsule (100 mg total) by mouth 2 (two) times daily for 7 days. 09/30/20 10/07/20  Kennith Gain, MD   EPINEPHrine (AUVI-Q) 0.3 mg/0.3 mL IJ SOAJ injection Use as directed for severe allergic reactions 02/08/20   Valentina Shaggy, MD  famotidine (PEPCID) 20 MG tablet TAKE 1 TABLET BY MOUTH TWICE A DAY 07/24/20   Valentina Shaggy, MD  ferrous sulfate 325 (65 FE) MG tablet 3 (three) times a week.  08/16/19   [provider]  Fluticasone Propionate (XHANCE) 93 MCG/ACT EXHU Place 2 sprays into both nostrils 2 (two) times daily. 06/28/20   Valentina Shaggy, MD  hydrOXYzine (ATARAX/VISTARIL) 25 MG tablet Take 1 tablet (25 mg total) by mouth 3 (three) times daily as needed. 02/08/20   Valentina Shaggy, MD  levalbuterol [facility] HFA) 45 MCG/ACT inhaler Inhale 2 puffs into the lungs every 6 (six) hours as needed for wheezing. 09/25/20 10/25/20  Valentina Shaggy, MD  levalbuterol (XOPENEX) 1.25 MG/3ML nebulizer solution USE 1 VIAL BY NEBULIZATION EVERY 6 (SIX) HOURS AS NEEDED FOR WHEEZING. 09/25/20   Valentina Shaggy, MD  metoprolol succinate (TOPROL-XL) 100 MG 24 hr tablet Take 100 mg by mouth daily. Take with or immediately following a meal.    [provider]  mometasone (ASMANEX, 30 METERED DOSES,) 220 MCG/INH inhaler Inhale 2 puffs into the lungs 2 (two) times daily. 05/03/20   Valentina Shaggy, MD  montelukast (SINGULAIR) 10 MG tablet TAKE 1 TABLET BY MOUTH EVERY DAY 04/17/20   Valentina Shaggy, MD  norethindrone [facility]) 0.35 MG tablet Take 0.35 mg by mouth. 11/15/15   [provider]  ondansetron (ZOFRAN) 4 MG tablet Take 1 tablet (4 mg total) by mouth every 8 (eight) hours as needed for nausea or vomiting. 03/25/20   Valentina Shaggy, MD  pantoprazole (PROTONIX) 40 MG tablet Take 40 mg by mouth daily. 04/13/15   [provider]  predniSONE (DELTASONE) 10 MG tablet 2 tablets twice a ay for 4 days then 2 tablets for 2 days then 1 tablet on the last day 10/02/20   Ambs, Kathrine Cords, FNP  Probiotic Product ([place]) [place]  08/16/19    [provider]    Allergies    Budesonide-formoterol fumarate, Penicillins, Albuterol, and Losartan  Review of Systems   Review of Systems  Constitutional: Negative for fatigue and fever.  HENT: Negative for congestion, ear pain, rhinorrhea and sore throat.   Respiratory: Negative for cough, shortness of breath and wheezing.   Cardiovascular: Negative for chest pain.  Gastrointestinal: Negative for abdominal pain, diarrhea, nausea and vomiting.  Musculoskeletal: Negative for myalgias.  Skin: Negative for rash.  Neurological: Negative for loss of consciousness and headaches.    Physical Exam Updated Vital Signs BP (!) 168/83 (BP Location: Right Arm)   Pulse 98   Temp 98.7 F (37.1 C) (Oral)   Resp 18   Ht 5\' 8"  (1.727 m)   Wt 131 kg   LMP 09/19/2020   SpO2 100%   BMI 43.91 kg/m  Physical Exam Vitals and nursing note reviewed.  Constitutional:      General: She is not in acute distress.    Appearance: Normal appearance.  HENT:     Head: Normocephalic and atraumatic.     Nose: Nose normal.     Mouth/Throat:     Mouth: Mucous membranes are moist.     Pharynx: Oropharynx is clear. No oropharyngeal exudate or posterior oropharyngeal erythema.  Eyes:     Extraocular Movements: Extraocular movements intact.  Cardiovascular:     Rate and Rhythm: Normal rate and regular rhythm.     Pulses: Normal pulses.     Heart sounds: Normal heart sounds.  Pulmonary:     Effort: Pulmonary effort is normal.     Breath sounds: Normal breath sounds. No wheezing or rales.  Abdominal:     General: Abdomen is flat. Bowel sounds are normal.     Palpations: Abdomen is soft.     Tenderness: There is no abdominal tenderness.  Musculoskeletal:        General: Normal range of motion.     Cervical back: Normal range of motion and neck supple.  Skin:    General: Skin is warm and dry.     Capillary Refill: Capillary refill takes less than 2 seconds.  Neurological:     General: No  focal deficit present.     Mental Status: She is alert and oriented to person, place, and time.     Deep Tendon Reflexes: Reflexes normal.  Psychiatric:        Thought Content: Thought content normal.     ED Results / Procedures / Treatments   Labs (all labs ordered are listed, but only abnormal results are displayed) Labs Reviewed - No data to display  EKG None  Radiology DG Chest 2 View  Result Date: 10/03/2020 CLINICAL DATA:  Cough, shortness of breath EXAM: CHEST - 2 VIEW COMPARISON:  02/16/2020 FINDINGS: Lungs are clear.  No pleural effusion or pneumothorax. The heart is normal in size. Visualized osseous structures are within normal limits. IMPRESSION: Normal chest radiographs. Electronically Signed   By: Julian Hy M.D.   On: 10/03/2020 13:12    Procedures Procedures   Medications Ordered in ED Medications - No data to display  ED Course  I have reviewed the triage vital signs and the nursing notes.  Pertinent labs & imaging results that were available during my care of the patient were reviewed by me and considered in my medical decision making (see chart for details).    Patient feels this way secondary to multiple steroids and beta agonists.  Likely the steroid shot made this worse.  Have advised hydration and it will take time for the steroids effect (side effect) to dissipate.   Final Clinical Impression(s) / ED Diagnoses Final diagnoses:  Adverse effect of drug, initial encounter   Return for intractable cough, coughing up blood, fevers >100.4 unrelieved by medication, shortness of breath, intractable vomiting, chest pain, shortness of breath, weakness, numbness, changes in speech, facial asymmetry, abdominal pain, passing out, Inability to tolerate liquids or food, cough, altered mental status or any concerns. No signs of systemic illness or infection. The patient is nontoxic-appearing on exam and vital signs are within normal limits.  I have reviewed the  triage vital signs and the nursing notes. Pertinent labs & imaging results that were available during my care of the patient were reviewed by me and considered in my medical decision making (see chart for details).  After history, exam, and medical workup I feel the patient has been appropriately medically screened and is safe for discharge home. Pertinent diagnoses were discussed with the patient. Patient was given return precautions.      Shrihaan Porzio, MD 10/05/20 [phone]

## 2020-10-05 NOTE — ED Triage Notes (Signed)
Patient presents with complaints of intermittent muscle twitching in upper and lower extremities. Ambulatory to room with steady gait. States recent steroid and inhaler use for asthma.

## 2020-10-06 ENCOUNTER — Telehealth: Payer: Self-pay

## 2020-10-06 NOTE — Telephone Encounter (Signed)
Pt called in and said  She is still having some trouble with breathing and wont's to know if you still wont her to taper on the prednisone starting tomorrow?

## 2020-10-06 NOTE — Telephone Encounter (Signed)
Let's have her stay at the same dose through the weekend (20 mg twice a day for sat and sun) and have her begin the taper on Monday morning. Please have her call the clinic or go to the ED or urgent care with any worsening of symptoms.

## 2020-10-06 NOTE — Telephone Encounter (Signed)
Pt informed of this and will start it

## 2020-10-06 NOTE — Progress Notes (Signed)
I looked at patient Ins and she has [employer] and has to try and fail 3 of 4 therapies.  She already has approval for Hizentra 38  gram biweekly we had gotten back in Jan 2022 good till July and would have to try Gammunex and one other

## 2020-10-09 ENCOUNTER — Other Ambulatory Visit: Payer: Self-pay

## 2020-10-09 ENCOUNTER — Encounter: Payer: Self-pay | Admitting: Family Medicine

## 2020-10-09 ENCOUNTER — Ambulatory Visit (INDEPENDENT_AMBULATORY_CARE_PROVIDER_SITE_OTHER): Payer: 59 | Admitting: Family Medicine

## 2020-10-09 VITALS — BP 130/80 | HR 97 | Temp 98.8°F | Resp 16

## 2020-10-09 DIAGNOSIS — J3089 Other allergic rhinitis: Secondary | ICD-10-CM | POA: Diagnosis not present

## 2020-10-09 DIAGNOSIS — D808 Other immunodeficiencies with predominantly antibody defects: Secondary | ICD-10-CM

## 2020-10-09 DIAGNOSIS — K219 Gastro-esophageal reflux disease without esophagitis: Secondary | ICD-10-CM | POA: Diagnosis not present

## 2020-10-09 DIAGNOSIS — T7800XD Anaphylactic reaction due to unspecified food, subsequent encounter: Secondary | ICD-10-CM

## 2020-10-09 DIAGNOSIS — B999 Unspecified infectious disease: Secondary | ICD-10-CM

## 2020-10-09 DIAGNOSIS — J4540 Moderate persistent asthma, uncomplicated: Secondary | ICD-10-CM

## 2020-10-09 DIAGNOSIS — J302 Other seasonal allergic rhinitis: Secondary | ICD-10-CM

## 2020-10-09 NOTE — Progress Notes (Signed)
[address] [place] [address] Dept: [phone]  FOLLOW UP NOTE  Patient ID: Nicole Mendoza, female    DOB: 1991-04-14  Age: 30 y.o. MRN: [id number] Date of Office Visit: 10/09/2020  Assessment  Chief Complaint: Asthma  HPI Nicole Mendoza is a 30 year old female who presents to the clinic for follow-up visit.  She was last seen in this clinic on 10/04/2020 for evaluation of asthma with acute exacerbation, allergic rhinitis, acute sinusitis, recurrent infections, reflux, and food allergy to peanuts and tree nuts.  At today's visit she reports that she was feeling pretty well with over the weekend and began to experience symptoms beginning on Sunday night.  She reports an " uncomfortable" and "tight" feeling in her chest that developed last evening.  She reports this caused her to have trouble sleeping and when she fell asleep she was dreaming about not breathing which caused her to startle and wake up wake up.  She reports that she feels as though she needs to take a deep breath in order to move any air. She reports hearing wheezing after using her albuterol treatments which eventually resolves. She reports occasional cough producing clear mucus. She does report that she has experienced increased anxiety related to asthma and her previous bout with COVID and reports that she has been very emotional including frequent episodes of crying over the last 24 hours. She reports these episodes with heightened emotion aggravate her asthma.  She continues montelukast 10 mg once a day, Asmanex 220-2 puffs twice a day, Pulmicort 0.5 mg twice a day via nebulizer, prednisone taper, and albuterol about every 4-6 hours. She reports that she can not take LABA due to heart rate increase as well as heart palpitations. She reports that she has used a LAMA with no adverse effects in the past.  Allergic rhinitis is reported as moderately well controlled with occasional clear nasal drainage for which she continues  cetirizine and Xhance.  She continues azithromycin 3 days a week for history of recurrent infections.  She continues pantoprazole and famotidine twice a day, however, she did not remember to take these medications last night.  She continues to avoid peanuts and tree nuts with no accidental ingestion or EpiPen use since her last visit to this clinic.  Her current medications are listed in the chart.    Drug Allergies:  Allergies  Allergen Reactions  . Budesonide-Formoterol Fumarate Other (See Comments)    Causes Hypertension  . Penicillins Hives  . Albuterol Palpitations  . Losartan Rash    Physical Exam: BP 130/80   Pulse 97   Temp 98.8 F (37.1 C) (Temporal)   Resp 16   LMP 09/19/2020   SpO2 99%    Physical Exam Vitals reviewed.  Constitutional:      Appearance: Normal appearance.  HENT:     Head: Normocephalic and atraumatic.     Right Ear: Tympanic membrane normal.     Left Ear: Tympanic membrane normal.     Nose:     Comments: Bilateral nares normal. Pharynx normal. Ears normal. Eyes normal.    Mouth/Throat:     Pharynx: Oropharynx is clear.  Eyes:     Conjunctiva/sclera: Conjunctivae normal.  Cardiovascular:     Rate and Rhythm: Normal rate and regular rhythm.     Heart sounds: Normal heart sounds. No murmur heard.   Pulmonary:     Effort: Pulmonary effort is normal.     Breath sounds: Normal breath sounds.  Comments: Lungs clear to auscultation Musculoskeletal:        General: Normal range of motion.     Cervical back: Normal range of motion and neck supple.  Skin:    General: Skin is warm and dry.  Neurological:     Mental Status: She is alert and oriented to person, place, and time.  Psychiatric:        Mood and Affect: Mood normal.        Behavior: Behavior normal.        Thought Content: Thought content normal.        Judgment: Judgment normal.     Diagnostics: FVC 4.35, FEV1 3.52.  Predicted FVC 4.30, predicted FEV1 3.61.  Spirometry  indicates normal ventilatory function.  Assessment and Plan: 1. Not well controlled moderate persistent asthma   2. Recurrent infections   3. Gastroesophageal reflux disease, unspecified whether esophagitis present   4. Anaphylactic shock due to food, subsequent encounter   5. Seasonal and perennial allergic rhinitis   6. 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)     Patient Instructions  Asthma Begin Spiriva 1.25 mcg-2 puffs twice a day to prevent cough or wheeze Continue montelukast 10 mg once a day to prevent cough or wheeze Continue Asthmanex 220- 2 puffs twice a day to prevent cough or wheeze For now, and for asthma flares, continue Pulmicort 0.5 mg twice a day via nebulizer for 2 weeks or until cough and wheeze free.Continue albuterol 2 puffs every 4 hours as needed for cough or wheeze OR Instead use albuterol 0.083% solution via nebulizer one unit vial every 4-6 hours as needed for cough or wheeze Continue your current prednisone taper as previously written Call the clinic for any worsening of symptoms or if your symptoms are not improving  Consider referral to pulmonology if no improvement in your symptoms  Sighing dyspnea Printed information provided regarding sighing dyspnea and breathing method to relieve sighing dyspnea from up-to-date  Allergic rhinitis Continue allergen avoidance measures directed toward pollens and dust mites as listed below Continue saline rinses several times a day Continue cetirizine 10 mg once a day as needed for a runny nose or itch Continue Xhance 2 sprays in each nostril twice a day for nasal symptoms  Recurrent infections Continue azithromycin three days a week Continue to keep track of infections We will consider immune globulin replacement therapy. We will be in touch with our biologics coordinator [name] regarding this medication.   Reflux Continue pantoprazole and famotidine as  directed Continue dietary and lifestyle modifications as listed below  Food allergy Continue to avoid peanuts and tree nuts.  In case of an allergic reaction, take Benadryl 50 mg every 4 hours, and if life-threatening symptoms occur, inject with AuviQ 0.3 mg.  Call the clinic if this treatment plan is not working well for you  Follow up in 2 weeks or sooner if needed.   Return in about 2 weeks (around 10/23/2020), or if symptoms worsen or fail to improve.    Thank you for the opportunity to care for this patient.  Please do not hesitate to contact me with questions.  Gareth Morgan, FNP [facility] [place] [facility] [place]

## 2020-10-09 NOTE — Patient Instructions (Addendum)
Asthma Begin Spiriva 1.25 mcg-2 puffs twice a day to prevent cough or wheeze Continue montelukast 10 mg once a day to prevent cough or wheeze Continue Asthmanex 220- 2 puffs twice a day to prevent cough or wheeze For now, and for asthma flares, continue Pulmicort 0.5 mg twice a day via nebulizer for 2 weeks or until cough and wheeze free.Continue albuterol 2 puffs every 4 hours as needed for cough or wheeze OR Instead use albuterol 0.083% solution via nebulizer one unit vial every 4-6 hours as needed for cough or wheeze Continue your current prednisone taper as previously written Call the clinic for any worsening of symptoms or if your symptoms are not improving  Consider referral to pulmonology if no improvement in your symptoms  Sighing dyspnea Printed information provided regarding sighing dyspnea and breathing method to relieve sighing dyspnea from up-to-date  Allergic rhinitis Continue allergen avoidance measures directed toward pollens and dust mites as listed below Continue saline rinses several times a day Continue cetirizine 10 mg once a day as needed for a runny nose or itch Continue Xhance 2 sprays in each nostril twice a day for nasal symptoms  Recurrent infections Continue azithromycin three days a week Continue to keep track of infections We will consider immune globulin replacement therapy. We will be in touch with our biologics coordinator [name] regarding this medication.   Reflux Continue pantoprazole and famotidine as directed Continue dietary and lifestyle modifications as listed below  Food allergy Continue to avoid peanuts and tree nuts.  In case of an allergic reaction, take Benadryl 50 mg every 4 hours, and if life-threatening symptoms occur, inject with AuviQ 0.3 mg.  Call the clinic if this treatment plan is not working well for you  Follow up in 2 weeks or sooner if needed.

## 2020-10-12 ENCOUNTER — Other Ambulatory Visit: Payer: Self-pay

## 2020-10-12 ENCOUNTER — Ambulatory Visit: Payer: 59 | Admitting: Allergy & Immunology

## 2020-10-12 ENCOUNTER — Encounter: Payer: Self-pay | Admitting: Allergy & Immunology

## 2020-10-12 ENCOUNTER — Ambulatory Visit (INDEPENDENT_AMBULATORY_CARE_PROVIDER_SITE_OTHER): Payer: 59 | Admitting: Allergy & Immunology

## 2020-10-12 VITALS — BP 122/84 | HR 102 | Temp 98.3°F | Resp 20

## 2020-10-12 DIAGNOSIS — R059 Cough, unspecified: Secondary | ICD-10-CM | POA: Diagnosis not present

## 2020-10-12 DIAGNOSIS — B999 Unspecified infectious disease: Secondary | ICD-10-CM | POA: Diagnosis not present

## 2020-10-12 DIAGNOSIS — K219 Gastro-esophageal reflux disease without esophagitis: Secondary | ICD-10-CM

## 2020-10-12 DIAGNOSIS — D808 Other immunodeficiencies with predominantly antibody defects: Secondary | ICD-10-CM

## 2020-10-12 DIAGNOSIS — J3089 Other allergic rhinitis: Secondary | ICD-10-CM

## 2020-10-12 DIAGNOSIS — J4540 Moderate persistent asthma, uncomplicated: Secondary | ICD-10-CM

## 2020-10-12 DIAGNOSIS — J302 Other seasonal allergic rhinitis: Secondary | ICD-10-CM

## 2020-10-12 DIAGNOSIS — R0602 Shortness of breath: Secondary | ICD-10-CM | POA: Diagnosis not present

## 2020-10-12 DIAGNOSIS — T7800XD Anaphylactic reaction due to unspecified food, subsequent encounter: Secondary | ICD-10-CM

## 2020-10-12 MED ORDER — SUCRALFATE 1 G PO TABS: 1.0000 g | ORAL_TABLET | Freq: Three times a day (TID) | ORAL | 3 refills | Status: DC

## 2020-10-12 NOTE — Progress Notes (Signed)
Fitzgerald - I am seeing her today. She needs to be on something. I will update you after the visit today.  Salvatore Marvel, MD [facility] [place] [facility] [place]

## 2020-10-12 NOTE — Progress Notes (Signed)
FOLLOW UP  Date of Service/Encounter:  10/12/20   Assessment:   Adverse reaction to COVID19 vaccination - clearly not IgE mediated, but she is now fully vaccinatedand needing a booster  Mild persistent asthma - previously well-controlled on Asmanex BID with PRN Pulmicort BID, but with worsening symptoms over the last 3-4 weeks  Recurrent infections- improved following Pneumovax, but then worsened (streptococcal avidity assay abnormal), agreed to start immunoglobulin therapy today  Seasonal and perennial allergic rhinitis(trees, weeds, grasses and dust mites)-not well controlled  Gastroesophageal reflux disease- onpantoprazoleand famotidineand PRN carafate  Adverse food reactions -resolvedwith minimizing exposure to food additives and avoiding peanuts/tree nuts  Snoring with perceived poor sleep quality-improved with use of the CPAP  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 Seasonal and perennial allergic rhinitis (trees, weeds, grasses and dust mites) - Continue with: Zyrtec (cetirizine) 10mg  tablet once daily, Singulair (montelukast) 10mg  daily + Xhance two sprays per nostril twice daily.    2. Adverse food reaction (tree nuts, peanuts)  - Continue to avoid peanuts and tree nuts.  Wynona Luna is up to date.    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   - We are going to start immunoglobulin replacement therapy.  - [name] will talk to you about getting this all started and set up.  - She already has the medication approved.    4. Mild persistent asthma, uncomplicated with possible overlying vocal cord dysfunction - We did do a spiro which was completely normal.  - I think this might be vocal cord dysfunction, so we will refer you to Dr. Johnna Acosta who works with a speech therapist.  - Breathing exercises discussed and provided today.  - We are going to obtain a chest CT given the continued  symptoms. - I would strongly consider doing Pulmonary Rehab again (PCP is going to manage this referral). - Agree with therapy sessions and possible initiation of anxiolytic (PCP is managing this). - Daily controller medication(s): Pulmicort 0.5mg  twice daily via nebulizer + Singulair 10mg  + Spiriva two puffs once daily - Prior to physical activity: Xopenex 2 puffs 10-15 minutes before physical activity. - Rescue medications: Xopenex 4 puffs every 4-6 hours as needed - Changes during respiratory infections or worsening symptoms: Add on Pulmicort 0.5mg  2-4 TIMES daily for ONE TO TWO WEEKS. - Asthma control goals:  * Full participation in all desired activities (may need albuterol before activity) * Albuterol use two time or less a week on average (not counting use with activity) * Cough interfering with sleep two time or less a month * Oral steroids no more than once a year * No hospitalizations  5. Return in about 6 weeks (around 11/23/2020).    Subjective:   Nicole Mendoza is a 30 y.o. female presenting today for follow up of  Chief Complaint  Patient presents with  . Asthma    Nicole Mendoza has a history of the following: Patient Active Problem List   Diagnosis Date Noted  . Recurrent infections 10/02/2020  . Body aches 10/05/2019  . Muscle pain 10/05/2019  . Physical deconditioning 10/05/2019  . Palpitations 09/02/2019  . Cardiac murmur 09/02/2019  . History of 2019 novel coronavirus disease (COVID-19) 09/02/2019  . History of COVID-19 09/01/2019  . Tachycardia 09/01/2019  . Salivary stone 09/01/2019  . Thrush 09/01/2019  . Fatigue 09/01/2019  . Asthma 09/01/2019  . Irregular bowel habits 07/29/2019  . 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  Seasonal and perennial allergic rhinitis 01/22/2019  . Morbid obesity with BMI of 45.0-49.9, adult ([place]) 10/28/2017  . Moderate persistent asthma without complication 0000000  . Mild persistent asthma without  complication 0000000  . Essential hypertension 04/22/2017  . Dysmenorrhea 06/26/2016  . Menorrhagia with regular cycle 06/26/2016  . Gastroesophageal reflux disease 12/22/2015  . Current use of beta blocker 12/22/2015  . Moderate persistent asthma with acute exacerbation 05/09/2015  . Acute maxillary sinusitis 05/09/2015  . Hypertension 05/09/2015  . Allergic rhinitis 11/02/2014  . Iron deficiency anemia 11/02/2014  . Mild intermittent asthma without complication [id number]  . Morbid obesity due to excess calories ([place]) 11/02/2014  . Vitamin D deficiency 11/02/2014  . Strain of back 03/25/2014    History obtained from: chart review and patient.  Nicole Mendoza is a 30 y.o. female presenting for a sick visit.  She is well-known to this practice.  She was last seen on April 25 by our nurse practitioner, [name].  At that time, she was continuing to report difficulty with breathing and a tight sensation in her chest.  She reported wheezing with intermittent response to albuterol.  She had already received at least 2 courses of prednisone, the last of which was extended.  She also received both cefdinir and doxycycline.  The nurse practitioner started her on Spiriva to see if this would help at all.  She remained on Asmanex 220 mcg 2 puffs twice daily as well as montelukast 10 mg daily.   In the interim, she wanted to come and see me since I just got back from vacation to make sure we are all on the same page. She is unsure if the prednisone dose anything at all at this point. She did have a good afternoon and slept well without any problems. She did not use her nebulizer at night. She woke up wheezing and took 4 puffs Xopenex. She is now tight again and wheezing.  It is hard to get a sense of whether the Xopenex helps at all.  She tells me that sometimes it does not sometimes it does not.  She was very tight the other day and felt that she "was going to die".  She continues to need a lot of reassurance  regarding resting at night.  She has not been febrile at all.  She denies any sputum production.  She has been having quite a bit of reflux, which she assumes is from the pantoprazole.  She is open to adding Carafate today.  He is wheezing episodes in retrospect do seem to be secondary to eating and some points.  She is already scheduled to start seeing a therapist. She denies feeling anxious about anything in particular.  She has been using her hydroxyzine which we will provide her to see if this helps with the episodes.  It does not seem to do much.  She has taken 2-3 at a time without any relief.   She remains interested in immunoglobulin replacement, especially fill out episodes like this.  I told her this is not a sure but, but I can help with her frequency of infections which seems to have exacerbated this current episode.  She has not had any of her known food allergies.  Her EpiPen is up-to-date.  She remains on her Truett Perna as well as her Singulair for control of her postnasal drip.  She does take an antihistamine every day as well.  We have discussed starting allergy shots in the past, but she has not  made that move yet.  Otherwise, there have been no changes to her past medical history, surgical history, family history, or social history.    Review of Systems  Constitutional: Negative.  Negative for chills, fever, malaise/fatigue and weight loss.  HENT: Negative.  Negative for congestion, ear discharge and ear pain.   Eyes: Negative for pain, discharge and redness.  Respiratory: Positive for cough and sputum production. Negative for shortness of breath and wheezing.        Cough productive of clear mucous.  Cardiovascular: Negative.  Negative for chest pain and palpitations.  Gastrointestinal: Negative for abdominal pain, heartburn, nausea and vomiting.  Skin: Negative.  Negative for itching and rash.  Neurological: Negative for dizziness and headaches.  Endo/Heme/Allergies: Negative  for environmental allergies. Does not bruise/bleed easily.       Objective:   Blood pressure 122/84, pulse (!) 102, temperature 98.3 F (36.8 C), temperature source Tympanic, resp. rate 20, last menstrual period 09/19/2020, SpO2 100 %. There is no height or weight on file to calculate BMI.   Physical Exam:  Physical Exam Constitutional:      Appearance: She is well-developed.     Comments: Very anxious. Talkative as always.   HENT:     Head: Normocephalic and atraumatic.     Right Ear: Tympanic membrane, ear canal and external ear normal. No drainage, swelling or tenderness. Tympanic membrane is not injected, scarred, erythematous, retracted or bulging.     Left Ear: Tympanic membrane, ear canal and external ear normal. No drainage, swelling or tenderness. Tympanic membrane is not injected, scarred, erythematous, retracted or bulging.     Nose: No nasal deformity, septal deviation, mucosal edema or rhinorrhea.     Right Turbinates: Enlarged and swollen.     Left Turbinates: Enlarged and swollen.     Right Sinus: No maxillary sinus tenderness or frontal sinus tenderness.     Left Sinus: No maxillary sinus tenderness or frontal sinus tenderness.     Comments: Scant clear rhinorrhea.    Mouth/Throat:     Mouth: Mucous membranes are not pale and not dry.     Pharynx: Uvula midline.  Eyes:     General:        Right eye: No discharge.        Left eye: No discharge.     Conjunctiva/sclera: Conjunctivae normal.     Right eye: Right conjunctiva is not injected. No chemosis.    Left eye: Left conjunctiva is not injected. No chemosis.    Pupils: Pupils are equal, round, and reactive to light.  Cardiovascular:     Rate and Rhythm: Normal rate and regular rhythm.     Heart sounds: Normal heart sounds.  Pulmonary:     Effort: Pulmonary effort is normal. No tachypnea, accessory muscle usage or respiratory distress.     Breath sounds: Normal breath sounds. No wheezing, rhonchi or rales.      Comments: Moving air well in all lung fields.  No increased work of breathing. Chest:     Chest wall: No tenderness.  Abdominal:     Tenderness: There is no abdominal tenderness. There is no guarding or rebound.  Lymphadenopathy:     Head:     Right side of head: No submandibular, tonsillar or occipital adenopathy.     Left side of head: No submandibular, tonsillar or occipital adenopathy.     Cervical: No cervical adenopathy.  Skin:    General: Skin is warm.     Capillary  Refill: Capillary refill takes less than 2 seconds.     Coloration: Skin is not pale.     Findings: No abrasion, erythema, petechiae or rash. Rash is not papular, urticarial or vesicular.     Comments: No eczematous or urticarial lesions noted.  Neurological:     Mental Status: She is alert.  Psychiatric:        Behavior: Behavior is cooperative.      Diagnostic studies:    Spirometry: results normal (FEV1: 3.53/98%, FVC: 4.27/99%, FEV1/FVC: 83%).    Spirometry consistent with normal pattern.  Allergy Studies: none       Salvatore Marvel, MD  [facility]

## 2020-10-12 NOTE — Patient Instructions (Addendum)
1. Seasonal and perennial allergic rhinitis (trees, weeds, grasses and dust mites) - Continue with: Zyrtec (cetirizine) 10mg  tablet once daily, Singulair (montelukast) 10mg  daily + Xhance two sprays per nostril twice daily.    2. Adverse food reaction (tree nuts, peanuts)  - Continue to avoid peanuts and tree nuts.  Nicole Mendoza is up to date.    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   - We are going to start immunoglobulin replacement therapy.  - [name] will talk to you about getting this all started and set up.  - She already has the medication approved.    4. Mild persistent asthma, uncomplicated with possible overlying vocal cord dysfunction - We did not do spiro since this was normal.  - I think this might be vocal cord dysfunction, so we will refer you to Dr. Johnna Acosta who works with a speech therapist.  - Daily controller medication(s): Pulmicort 0.5mg  twice daily via nebulizer + Singulair 10mg  + Spiriva two puffs once daily - Prior to physical activity: Xopenex 2 puffs 10-15 minutes before physical activity. - Rescue medications: Xopenex 4 puffs every 4-6 hours as needed - Changes during respiratory infections or worsening symptoms: Add on Pulmicort 0.5mg  2-4 TIMES daily for ONE TO TWO WEEKS. - Asthma control goals:  * Full participation in all desired activities (may need albuterol before activity) * Albuterol use two time or less a week on average (not counting use with activity) * Cough interfering with sleep two time or less a month * Oral steroids no more than once a year * No hospitalizations  5. Return in about 6 weeks (around 11/23/2020).    Please inform us of any Emergency Department visits, hospitalizations, or changes in symptoms. Call us before going to the ED for breathing or allergy symptoms since we might be able to fit you in for a sick visit. Feel free to contact us anytime with any questions, problems, or concerns.  It  was a pleasure to see you again today!  Websites that have reliable patient information: 1. American Academy of Asthma, Allergy, and Immunology: www.aaaai.org 2. Food Allergy Research and Education (FARE): foodallergy.org 3. Mothers of Asthmatics: http://www.asthmacommunitynetwork.org 4. American College of Allergy, Asthma, and Immunology: www.acaai.org   COVID-19 Vaccine Information can be found at: ShippingScam.co.uk For questions related to vaccine distribution or appointments, please email [email or web address] or call [phone].   We realize that you might be concerned about having an allergic reaction to the COVID19 vaccines. To help with that concern, WE ARE OFFERING THE COVID19 VACCINES IN OUR OFFICE! Ask the front desk for dates!     "Like" [place] on Facebook and Instagram for our latest updates!      A healthy democracy works best when [employer] participate! Make sure you are registered to vote! If you have moved or changed any of your contact information, you will need to get this updated before voting!  In some cases, you MAY be able to register to vote online: [email or web address]

## 2020-10-13 ENCOUNTER — Other Ambulatory Visit: Payer: Self-pay

## 2020-10-13 MED ORDER — SPIRIVA RESPIMAT 1.25 MCG/ACT IN AERS: 2.0000 | INHALATION_SPRAY | Freq: Every day | RESPIRATORY_TRACT | 5 refills | Status: DC

## 2020-10-14 ENCOUNTER — Other Ambulatory Visit: Payer: Self-pay | Admitting: Allergy & Immunology

## 2020-10-14 DIAGNOSIS — J3089 Other allergic rhinitis: Secondary | ICD-10-CM

## 2020-10-14 DIAGNOSIS — J45901 Unspecified asthma with (acute) exacerbation: Secondary | ICD-10-CM

## 2020-10-16 ENCOUNTER — Encounter: Payer: Self-pay | Admitting: Allergy & Immunology

## 2020-10-16 ENCOUNTER — Telehealth (INDEPENDENT_AMBULATORY_CARE_PROVIDER_SITE_OTHER): Payer: 59 | Admitting: Nurse Practitioner

## 2020-10-16 ENCOUNTER — Telehealth: Payer: Self-pay | Admitting: Allergy & Immunology

## 2020-10-16 DIAGNOSIS — Z8619 Personal history of other infectious and parasitic diseases: Secondary | ICD-10-CM

## 2020-10-16 DIAGNOSIS — T7840XD Allergy, unspecified, subsequent encounter: Secondary | ICD-10-CM

## 2020-10-16 MED ORDER — IPRATROPIUM BROMIDE 0.03 % NA SOLN: 2.0000 | Freq: Three times a day (TID) | NASAL | 5 refills | Status: DC

## 2020-10-16 MED ORDER — FLUCONAZOLE 100 MG PO TABS: 100.0000 mg | ORAL_TABLET | Freq: Every day | ORAL | 0 refills | Status: AC

## 2020-10-16 MED ORDER — IPRATROPIUM BROMIDE 0.02 % IN SOLN: 0.5000 mg | Freq: Four times a day (QID) | RESPIRATORY_TRACT | 2 refills | Status: DC

## 2020-10-16 NOTE — Progress Notes (Addendum)
Virtual Visit via Telephone Note  I connected with Nicole Mendoza on 10/16/20 at  9:30 AM EDT by telephone and verified that I am speaking with the correct person using two identifiers.  Location: Patient: home Provider: office   I discussed the limitations, risks, security and privacy concerns of performing an evaluation and management service by telephone and the availability of in person appointments. I also discussed with the patient that there may be a patient responsible charge related to this service. The patient expressed understanding and agreed to proceed.   History of Present Illness:  Patient presents today for post-COVID care clinic visit through televisit.  She states that she has been sick for the past 3 weeks.  She has seen her asthma and allergy specialist who has ordered a high-resolution chest CT for her.  She has had a chest x-ray that is clear.  She did test negative for COVID.  She has been prescribed doxycycline she is also on azithromycin prophylactically that she was on before she was sick.  She has taken multiple rounds of prednisone.  She states that she still is having wheezing although it is improving.  She does still experiencing chest tightness.  She states that she has become very physically deconditioned again and may end up needing physical therapy to recover.  She is trying to stay as active as possible.  She is currently using her nebulizer treatments every 4 hours.  Along with her prescribed inhalers.  We discussed that she does need to try to cut back if possible on how many times she does her nebulizer treatments throughout the day.  We will try to switch her to Atrovent nebulizer to see if that she has any improvement.  She may also try Allegra along with Zyrtec and a double dose for the next couple days. Denies f/c/s, n/v/d, hemoptysis, PND, chest pain or edema       Observations/Objective:  Vitals with BMI 10/12/2020 10/09/2020 10/05/2020  Height - - -   Weight - - -  BMI - - -  Systolic 182 993 716  Diastolic 84 80 85  Pulse 967 97 96       Assessment and Plan:  History of Viral illness Chest tightness:   Stay well hydrated  Stay active  Deep breathing exercises  May take tylenol or fever or pain  May take mucinex twice daily  May try zyrtec and allegra  May try Atrovent neb in place of xopenex - please try to use 6 hours instead of 4 and wean off as tolerated    Thrush:  Diflucan ordered    Follow up:  Follow up in 1 week or sooner if needed     I discussed the assessment and treatment plan with the patient. The patient was provided an opportunity to ask questions and all were answered. The patient agreed with the plan and demonstrated an understanding of the instructions.   The patient was advised to call back or seek an in-person evaluation if the symptoms worsen or if the condition fails to improve as anticipated.  I provided 23 minutes of non-face-to-face time during this encounter.   Fenton Foy, NP

## 2020-10-16 NOTE — Telephone Encounter (Signed)
Patient contacted me with concerns for mast cell disease. Ordered serum tryptase. Also sent in Atrovent nebs per patient request.   Salvatore Marvel, MD [facility]

## 2020-10-16 NOTE — Progress Notes (Signed)
Called patient and advised approval and submit to [employer] and they will reach out to her to ship Hizentra and nursing for initial visit.

## 2020-10-16 NOTE — Progress Notes (Signed)
Referral placed over the phone with Dr. Rowe Clack and patient is scheduled for 5/26 at [facility] with patient to confirmed appointment and patient verbalized understanding.

## 2020-10-16 NOTE — Patient Instructions (Addendum)
History of Viral illness Chest tightness:   Stay well hydrated  Stay active  Deep breathing exercises  May take tylenol or fever or pain  May take mucinex twice daily  May try zyrtec and allegra  May try Atrovent neb in place of xopenex - please try to use 6 hours instead of 4 and wean off as tolerated    Thrush:  Diflucan ordered    Follow up:  Follow up in 1 week or sooner if needed

## 2020-10-16 NOTE — Addendum Note (Signed)
Addended by: Valentina Shaggy on: 10/16/2020 03:02 PM   Modules accepted: Orders

## 2020-10-16 NOTE — Addendum Note (Signed)
Addended by: Valentina Shaggy on: 10/16/2020 04:13 PM   Modules accepted: Orders

## 2020-10-16 NOTE — Telephone Encounter (Signed)
Added labs to look for pheochromytoma and carcinoid syndrome.   Salvatore Marvel, MD [facility]

## 2020-10-17 ENCOUNTER — Ambulatory Visit: Payer: 59 | Admitting: Family Medicine

## 2020-10-18 ENCOUNTER — Telehealth: Payer: Self-pay | Admitting: Cardiology

## 2020-10-18 ENCOUNTER — Ambulatory Visit: Payer: 59 | Admitting: Family Medicine

## 2020-10-18 LAB — CMP14+EGFR
ALT: 30 IU/L (ref 0–32)
AST: 27 IU/L (ref 0–40)
Albumin/Globulin Ratio: 1.5 (ref 1.2–2.2)
Albumin: 4.4 g/dL (ref 3.9–5.0)
Alkaline Phosphatase: 92 IU/L (ref 44–121)
BUN/Creatinine Ratio: 13 (ref 9–23)
BUN: 12 mg/dL (ref 6–20)
Bilirubin Total: 0.8 mg/dL (ref 0.0–1.2)
CO2: 18 mmol/L — ABNORMAL LOW (ref 20–29)
Calcium: 10 mg/dL (ref 8.7–10.2)
Chloride: 103 mmol/L (ref 96–106)
Creatinine, Ser: 0.89 mg/dL (ref 0.57–1.00)
Globulin, Total: 2.9 g/dL (ref 1.5–4.5)
Glucose: 96 mg/dL (ref 65–99)
Potassium: 3.8 mmol/L (ref 3.5–5.2)
Sodium: 141 mmol/L (ref 134–144)
Total Protein: 7.3 g/dL (ref 6.0–8.5)
eGFR: 90 mL/min/{1.73_m2} (ref >59)

## 2020-10-18 NOTE — Telephone Encounter (Signed)
Patient c/o Palpitations:  High priority if patient c/o lightheadedness, shortness of breath, or chest pain  1) How long have you had palpitations/irregular HR/ Afib? Are you having the symptoms now? Yes a few weeks   2) Are you currently experiencing lightheadedness, SOB or CP? yes  3) Do you have a history of afib (atrial fibrillation) or irregular heart rhythm? No  4) Have you checked your BP or HR? (document readings if available): HR siting in the 90s standing 120s  5) Are you experiencing any other symptoms? No    Pt is needing to speak with a triage nurse.When called over to [facility] the call was not taken.I had to tell the pt she would get a call back.
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
## 2020-10-19 ENCOUNTER — Ambulatory Visit (HOSPITAL_BASED_OUTPATIENT_CLINIC_OR_DEPARTMENT_OTHER)
Admission: RE | Admit: 2020-10-19 | Discharge: 2020-10-19 | Disposition: A | Discharge status: Home / Self Care | Payer: 59 | Source: Ambulatory Visit | Attending: Allergy & Immunology | Admitting: Allergy & Immunology

## 2020-10-19 ENCOUNTER — Telehealth (INDEPENDENT_AMBULATORY_CARE_PROVIDER_SITE_OTHER): Payer: 59 | Admitting: Cardiology

## 2020-10-19 ENCOUNTER — Encounter: Payer: Self-pay | Admitting: Cardiology

## 2020-10-19 ENCOUNTER — Other Ambulatory Visit: Payer: Self-pay

## 2020-10-19 VITALS — Ht 68.0 in | Wt 277.0 lb

## 2020-10-19 DIAGNOSIS — Z6841 Body Mass Index (BMI) 40.0 and over, adult: Secondary | ICD-10-CM

## 2020-10-19 DIAGNOSIS — R000 Tachycardia, unspecified: Secondary | ICD-10-CM

## 2020-10-19 DIAGNOSIS — R0602 Shortness of breath: Secondary | ICD-10-CM | POA: Insufficient documentation

## 2020-10-19 DIAGNOSIS — R002 Palpitations: Secondary | ICD-10-CM | POA: Diagnosis not present

## 2020-10-19 MED ORDER — IVABRADINE HCL 5 MG PO TABS: 5.0000 mg | ORAL_TABLET | Freq: Two times a day (BID) | ORAL | 3 refills | Status: DC

## 2020-10-19 NOTE — Patient Instructions (Signed)
Medication Instructions:  Your physician has recommended you make the following change in your medication:  START: Corlanor 5 mg take one tablet by mouth twice daily.  *If you need a refill on your cardiac medications before your next appointment, please call your pharmacy*   Lab Work: None If you have labs (blood work) drawn today and your tests are completely normal, you will receive your results only by: Marland Kitchen MyChart Message (if you have MyChart) OR . A paper copy in the mail If you have any lab test that is abnormal or we need to change your treatment, we will call you to review the results.   Testing/Procedures: None   Follow-Up: At [facility], you and your health needs are our priority.  As part of our continuing mission to provide you with exceptional heart care, we have created designated Provider Care Teams.  These Care Teams include your primary Cardiologist (physician) and Advanced Practice Providers (APPs -  Physician Assistants and Nurse Practitioners) who all work together to provide you with the care you need, when you need it.  We recommend signing up for the patient portal called "MyChart".  Sign up information is provided on this After Visit Summary.  MyChart is used to connect with patients for Virtual Visits (Telemedicine).  Patients are able to view lab/test results, encounter notes, upcoming appointments, etc.  Non-urgent messages can be sent to your provider as well.   To learn more about what you can do with MyChart, go to [email or web address].    Your next appointment:   1 month(s)  The format for your next appointment:   In Person  Provider:   Jyl Heinz, MD   Other Instructions

## 2020-10-19 NOTE — Progress Notes (Signed)
Virtual Visit via Video Note   This visit type was conducted due to national recommendations for restrictions regarding the COVID-19 Pandemic (e.g. social distancing) in an effort to limit this patient's exposure and mitigate transmission in our community.  Due to her co-morbid illnesses, this patient is at least at moderate risk for complications without adequate follow up.  This format is felt to be most appropriate for this patient at this time.  All issues noted in this document were discussed and addressed.  A limited physical exam was performed with this format.  Please refer to the patient's chart for her consent to telehealth for [facility].       Date:  10/19/2020   ID:  [name], DOB 1991-06-11, MRN [id number] The patient was identified using 2 identifiers.  Patient Location: Home Provider Location: Home Office   PCP:  Rubie Maid, MD   [place] Providers Cardiologist:  None     Evaluation Performed:  Follow-Up Visit  Chief Complaint: Palpitations and tachycardia  History of Present Illness:    Johnathan Tortorelli is a 30 y.o. female with.  Patient has past medical history of morbid obesity.  She has history of persistent tachycardia.  She also has symptoms of asthma and takes inhalers for this.  Fortunately she is on medication such as Xopenex which I do not think is contributing to her elevated heart rate.  She denies any chest pain orthopnea or PND.  Her tachycardia is significant.  Especially after dinner and when she has minimal ambulation she mentions to me that her heart rate goes into the 100s.  Sometimes it goes into the 120s and 130s that she is on a significant dose of beta-blocker.  At the time of my evaluation, the patient is alert awake oriented and in no distress.  The patient does not have symptoms concerning for COVID-19 infection (fever, chills, cough, or new shortness of breath).    Past Medical History:  Diagnosis Date  . Allergic rhinitis    . Angio-edema   . Asthma   . Fatty liver   . GERD (gastroesophageal reflux disease)   . Hypertension   . Recurrent upper respiratory infection (URI)   . Tachycardia    Past Surgical History:  Procedure Laterality Date  . NO PAST SURGERIES       Current Meds  Medication Sig  . Ascorbic Acid (SUPER C COMPLEX PO)   . azithromycin (ZITHROMAX) 500 MG tablet Take by mouth.  . budesonide (PULMICORT) 0.5 MG/2ML nebulizer solution Take 0.5 mg by nebulization as needed (shortness of breath and wheezing).  . cetirizine (ZYRTEC) 10 MG tablet Take by mouth.  . EPINEPHrine (AUVI-Q) 0.3 mg/0.3 mL IJ SOAJ injection Use as directed for severe allergic reactions  . famotidine (PEPCID) 20 MG tablet TAKE 1 TABLET BY MOUTH TWICE A DAY  . ferrous sulfate 325 (65 FE) MG tablet 3 (three) times a week.   . fluconazole (DIFLUCAN) 100 MG tablet Take 1 tablet (100 mg total) by mouth daily for 7 days.  . Fluticasone Propionate (XHANCE) 93 MCG/ACT EXHU Place 2 sprays into both nostrils 2 (two) times daily.  . hydrOXYzine (ATARAX/VISTARIL) 25 MG tablet Take 1 tablet (25 mg total) by mouth 3 (three) times daily as needed.  Marland Kitchen ipratropium (ATROVENT) 0.02 % nebulizer solution Take 2.5 mLs (0.5 mg total) by nebulization 4 (four) times daily.  Marland Kitchen levalbuterol (XOPENEX HFA) 45 MCG/ACT inhaler Inhale 2 puffs into the lungs every 6 (six) hours as needed  for wheezing.  . levalbuterol (XOPENEX) 1.25 MG/3ML nebulizer solution USE 1 VIAL BY NEBULIZATION EVERY 6 (SIX) HOURS AS NEEDED FOR WHEEZING.  . metoprolol succinate (TOPROL-XL) 100 MG 24 hr tablet Take 100 mg by mouth daily. Take with or immediately following a meal.  . mometasone (ASMANEX, 30 METERED DOSES,) 220 MCG/INH inhaler Inhale 2 puffs into the lungs 2 (two) times daily.  . montelukast (SINGULAIR) 10 MG tablet TAKE 1 TABLET BY MOUTH EVERY DAY  . norethindrone (MICRONOR) 0.35 MG tablet Take 0.35 mg by mouth.  . ondansetron (ZOFRAN) 4 MG tablet Take 1 tablet (4 mg  total) by mouth every 8 (eight) hours as needed for nausea or vomiting.  . pantoprazole (PROTONIX) 40 MG tablet Take 40 mg by mouth daily.  . potassium chloride (KLOR-CON) 8 MEQ tablet Take 8 mEq by mouth 2 (two) times daily.  . Probiotic Product ([place]) CAPS   . sucralfate (CARAFATE) 1 g tablet Take 1 tablet (1 g total) by mouth 4 (four) times daily -  with meals and at bedtime.     Allergies:   Budesonide-formoterol fumarate, Penicillins, Albuterol, and Losartan   Social History   Tobacco Use  . Smoking status: Never Smoker  . Smokeless tobacco: Never Used  Vaping Use  . Vaping Use: Never used  Substance Use Topics  . Alcohol use: No    Alcohol/week: 0.0 standard drinks  . Drug use: No     Family Hx: The patient's family history includes Allergic rhinitis in her father and sister; Asthma in her father and sister. There is no history of Angioedema, Atopy, Eczema, Immunodeficiency, or Urticaria.  ROS:   Please see the history of present illness.    I discussed my findings with the patient at length.  She has never had a syncopal episode. All other systems reviewed and are negative.   Prior CV studies:   The following studies were reviewed today:  As mentioned above  Labs/Other Tests and Data Reviewed:    EKG:  EKG from previous evaluation was reviewed  Recent Labs: 04/14/2020: Hemoglobin 13.6; Platelets 274 10/17/2020: ALT 30; BUN 12; Creatinine, Ser 0.89; Potassium 3.8; Sodium 141   Recent Lipid Panel No results found for: CHOL, TRIG, HDL, CHOLHDL, LDLCALC, LDLDIRECT  Wt Readings from Last 3 Encounters:  10/19/20 277 lb (125.6 kg)  10/05/20 288 lb 12.8 oz (131 kg)  06/20/20 290 lb (131.5 kg)     Risk Assessment/Calculations:      Objective:    Vital Signs:  Ht 5\' 8"  (1.727 m)   Wt 277 lb (125.6 kg)   LMP 09/19/2020   BMI 42.12 kg/m    VITAL SIGNS:  reviewed  ASSESSMENT & PLAN:    1. Palpitations and tachycardia: Has had this problem  for a very long time.  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  She is on a good dose of beta-blocker.  Patient also has been evaluated with TSH and monitoring in the past.  In view of this I discussed medication such as Corlanor and she is agreeable.  Benefits and potential risks explained.  She is on birth control pill and she mentions to me that she does not contemplate pregnancy at this time.  I told her that she should not be on Corlanor if she has thinking about pregnancy and she understands.  If she changes her mind she will stop her Corlanor.  She will keep a track of her pulse and blood pressure.  Get back to [place] in a month and follow-up appointment.  Benefits and risks explained and she vocalized understanding and questions were answered to her satisfaction. 2. Morbid obesity: Weight reduction was stressed.  Risks of obesity emphasized and she promises to do better         COVID-19 Education: The signs and symptoms of COVID-19 were discussed with the patient and how to seek care for testing (follow up with PCP or arrange E-visit).  The importance of social distancing was discussed today.  Time:   Today, I have spent 15 minutes with the patient with telehealth technology discussing the above problems.     Medication Adjustments/Labs and Tests Ordered: Current medicines are reviewed at length with the patient today.  Concerns regarding medicines are outlined above.   Tests Ordered: No orders of the defined types were placed in this encounter.   Medication Changes: No orders of the defined types were placed in this encounter.   Follow Up:  In Person in 1 month(s)  Signed, Jenean Lindau, MD  10/19/2020 8:26 AM    [place]

## 2020-10-20 ENCOUNTER — Ambulatory Visit (HOSPITAL_BASED_OUTPATIENT_CLINIC_OR_DEPARTMENT_OTHER): Payer: 59

## 2020-10-23 ENCOUNTER — Ambulatory Visit (INDEPENDENT_AMBULATORY_CARE_PROVIDER_SITE_OTHER): Payer: 59 | Admitting: Nurse Practitioner

## 2020-10-23 ENCOUNTER — Other Ambulatory Visit: Payer: Self-pay | Admitting: Allergy & Immunology

## 2020-10-23 VITALS — BP 119/84 | HR 104 | Temp 97.9°F | Resp 18

## 2020-10-23 DIAGNOSIS — Z8619 Personal history of other infectious and parasitic diseases: Secondary | ICD-10-CM | POA: Diagnosis not present

## 2020-10-23 DIAGNOSIS — R5383 Other fatigue: Secondary | ICD-10-CM | POA: Diagnosis not present

## 2020-10-23 MED ORDER — ONDANSETRON HCL 4 MG PO TABS: 4.0000 mg | ORAL_TABLET | Freq: Three times a day (TID) | ORAL | 0 refills | Status: DC | PRN

## 2020-10-23 NOTE — Progress Notes (Signed)
@Patient  ID: Nicole Mendoza, female    DOB: 01-25-1991, 30 y.o.   MRN: [id number]  Chief Complaint  Patient presents with  . Follow-up    Referring provider: Rubie Maid, MD  HPI  Patient presents today for post-COVID care clinic visit.  Patient states that she became ill again in April.  She is concerned that this may have been COVID again.  She was not tested.  Patient is fully vaccinated.  Patient does have a history of asthma has been seeing asthma and allergy specialist.  She states that since this weekend she has been feeling much better as far as breathing is concerned.  She is now only doing her Xopenex nebulizer twice daily.  She admits that she was doing this more often than prescribed for several days.  Her heart rate did become tachycardic.  And her blood sugars have dropped due to increased heart rate and decreased appetite.  Over the past 2 days her heart rate and blood sugars have been more stable.  She is around 100 bpm in office today.  She states to prior than that she was around 120 to 140 bpm.  Patient does continue to have extreme fatigue.  We discussed that may benefit her to follow-up with cardiology/Dr. Caryl Comes and also continue to decrease nebulizer use.  It would also be beneficial for her to start back with physical therapy.  She will be given home PT exercises to be working on at home.  She does have a trip planned to the beach for next week.  After she returns if she is still having extreme fatigue we will place a referral to PT at that time.  Patient also complains of nausea due to being prescribed prednisone recently.  Will refill Zofran.  Patient does have upcoming lab work scheduled we will add on thyroid level. Denies f/c/s, n/v/d, hemoptysis, PND, chest pain or edema.      Allergies  Allergen Reactions  . Budesonide-Formoterol Fumarate Other (See Comments)    Causes Hypertension  . Penicillins Hives  . Albuterol Palpitations  . Losartan Rash     Immunization History  Administered Date(s) Administered  . Pneumococcal Polysaccharide-23 05/12/2018    Past Medical History:  Diagnosis Date  . Allergic rhinitis   . Angio-edema   . Asthma   . Fatty liver   . GERD (gastroesophageal reflux disease)   . Hypertension   . Recurrent upper respiratory infection (URI)   . Tachycardia     Tobacco History: Social History   Tobacco Use  Smoking Status Never Smoker  Smokeless Tobacco Never Used   Counseling given: Yes   Outpatient Encounter Medications as of 10/23/2020  Medication Sig  . Ascorbic Acid (SUPER C COMPLEX PO)   . azithromycin (ZITHROMAX) 500 MG tablet Take by mouth.  . budesonide (PULMICORT) 0.5 MG/2ML nebulizer solution Take 0.5 mg by nebulization as needed (shortness of breath and wheezing).  . cetirizine (ZYRTEC) 10 MG tablet Take by mouth.  . EPINEPHrine (AUVI-Q) 0.3 mg/0.3 mL IJ SOAJ injection Use as directed for severe allergic reactions  . famotidine (PEPCID) 20 MG tablet TAKE 1 TABLET BY MOUTH TWICE A DAY  . ferrous sulfate 325 (65 FE) MG tablet 3 (three) times a week.   . fluconazole (DIFLUCAN) 100 MG tablet Take 1 tablet (100 mg total) by mouth daily for 7 days.  . Fluticasone Propionate (XHANCE) 93 MCG/ACT EXHU Place 2 sprays into both nostrils 2 (two) times daily.  . hydrOXYzine (ATARAX/VISTARIL) 25 MG  tablet Take 1 tablet (25 mg total) by mouth 3 (three) times daily as needed.  Marland Kitchen ipratropium (ATROVENT) 0.02 % nebulizer solution Take 2.5 mLs (0.5 mg total) by nebulization 4 (four) times daily.  . ivabradine (CORLANOR) 5 MG TABS tablet Take 1 tablet (5 mg total) by mouth 2 (two) times daily with a meal.  . levalbuterol (XOPENEX HFA) 45 MCG/ACT inhaler Inhale 2 puffs into the lungs every 6 (six) hours as needed for wheezing.  . levalbuterol (XOPENEX) 1.25 MG/3ML nebulizer solution USE 1 VIAL BY NEBULIZATION EVERY 6 (SIX) HOURS AS NEEDED FOR WHEEZING.  . metoprolol succinate (TOPROL-XL) 100 MG 24 hr tablet  Take 100 mg by mouth daily. Take with or immediately following a meal.  . mometasone (ASMANEX, 30 METERED DOSES,) 220 MCG/INH inhaler Inhale 2 puffs into the lungs 2 (two) times daily.  . montelukast (SINGULAIR) 10 MG tablet TAKE 1 TABLET BY MOUTH EVERY DAY  . norethindrone (MICRONOR) 0.35 MG tablet Take 0.35 mg by mouth.  . ondansetron (ZOFRAN) 4 MG tablet Take 1 tablet (4 mg total) by mouth every 8 (eight) hours as needed for nausea or vomiting.  . pantoprazole (PROTONIX) 40 MG tablet Take 40 mg by mouth daily.  . potassium chloride (KLOR-CON) 8 MEQ tablet Take 8 mEq by mouth 2 (two) times daily.  . Probiotic Product ([place]) CAPS   . sucralfate (CARAFATE) 1 g tablet Take 1 tablet (1 g total) by mouth 4 (four) times daily -  with meals and at bedtime.  . [DISCONTINUED] famotidine (PEPCID) 20 MG tablet TAKE 1 TABLET BY MOUTH TWICE A DAY  . [DISCONTINUED] ondansetron (ZOFRAN) 4 MG tablet Take 1 tablet (4 mg total) by mouth every 8 (eight) hours as needed for nausea or vomiting.   No facility-administered encounter medications on file as of 10/23/2020.     Review of Systems  Review of Systems  Constitutional: Positive for fatigue.  HENT: Negative.   Respiratory: Positive for cough and shortness of breath.   Cardiovascular: Positive for palpitations.  Gastrointestinal: Negative.   Allergic/Immunologic: Negative.   Neurological: Negative.   Psychiatric/Behavioral: Negative.        Physical Exam  BP 119/84   Pulse (!) 104   Temp 97.9 F (36.6 C)   Resp 18   SpO2 98%   Wt Readings from Last 5 Encounters:  10/19/20 277 lb (125.6 kg)  10/05/20 288 lb 12.8 oz (131 kg)  06/20/20 290 lb (131.5 kg)  04/14/20 290 lb (131.5 kg)  03/13/20 285 lb (129.3 kg)     Physical Exam Vitals and nursing note reviewed.  Constitutional:      General: She is not in acute distress.    Appearance: She is well-developed.  Cardiovascular:     Rate and Rhythm: Regular rhythm.  Tachycardia present.  Pulmonary:     Effort: Pulmonary effort is normal.     Breath sounds: Normal breath sounds.  Musculoskeletal:     Right lower leg: No edema.     Left lower leg: No edema.  Neurological:     Mental Status: She is alert and oriented to person, place, and time.  Psychiatric:        Mood and Affect: Mood normal.        Behavior: Behavior normal.       Imaging: DG Chest 2 View  Result Date: 10/03/2020 CLINICAL DATA:  Cough, shortness of breath EXAM: CHEST - 2 VIEW COMPARISON:  02/16/2020 FINDINGS: Lungs are clear.  No  pleural effusion or pneumothorax. The heart is normal in size. Visualized osseous structures are within normal limits. IMPRESSION: Normal chest radiographs. Electronically Signed   By: Julian Hy M.D.   On: 10/03/2020 13:12   CT Chest High Resolution  Result Date: 10/21/2020 CLINICAL DATA:  30 year old female with 4 weeks of dyspnea refractory to antibiotic and steroid therapy. COVID infection in 2020. EXAM: CT CHEST WITHOUT CONTRAST TECHNIQUE: Multidetector CT imaging of the chest was performed following the standard protocol without intravenous contrast. High resolution imaging of the lungs, as well as inspiratory and expiratory imaging, was performed. COMPARISON:  10/03/2020 chest radiograph. FINDINGS: Cardiovascular: Normal heart size. No significant pericardial effusion/thickening. Normal course and caliber of the thoracic aorta and pulmonary arteries. 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 Mediastinum/Nodes: No discrete thyroid nodules. Unremarkable esophagus. No pathologically enlarged axillary, mediastinal or hilar lymph nodes, noting limited sensitivity for the detection of hilar adenopathy on this noncontrast study. Mild triangular patchy mixed soft tissue and fat density in the anterior mediastinum compatible with mild thymic hyperplasia. Lungs/Pleura: No pneumothorax. No  pleural effusion. No acute consolidative airspace disease or lung masses. Tiny 0.3 cm solid subpleural pulmonary nodule in the peripheral right middle lobe (series 5/image 70). No additional significant pulmonary nodules. No significant regions of subpleural reticulation, ground-glass attenuation, traction bronchiectasis, architectural distortion, parenchymal banding or frank honeycombing. No significant air trapping or evidence of tracheobronchomalacia on the expiration sequence. Upper abdomen: No acute abnormality. Musculoskeletal: No aggressive appearing focal osseous lesions. Mild thoracic spondylosis. IMPRESSION: 1. No evidence of interstitial lung disease. 2. Tiny 0.3 cm solid subpleural right middle lobe pulmonary nodule, most compatible with a benign subpleural lymph node. No follow-up required unless the patient has significant risk factors for lung malignancy. 3. Aberrant right subclavian artery. Electronically Signed   By: Ilona Sorrel M.D.   On: 10/21/2020 10:39     Assessment & Plan:   History of viral illness History of Covid 19 Tachycardia:   Stay well hydrated  Stay active  Deep breathing exercises  May start vitamin C daily, vitamin D3 daily, Zinc daily  May take tylenol for fever or pain  Follow up with Dr. Caryl Comes - still concerned for POTS  Hand out given on home PT  Pace activities     Follow up:  Follow up in 4 weeks or sooner if needed      Fenton Foy, NP 10/23/2020

## 2020-10-23 NOTE — Patient Instructions (Addendum)
History of Covid 19 Tachycardia:   Stay well hydrated  Stay active  Deep breathing exercises  May start vitamin C daily, vitamin D3 daily, Zinc daily  May take tylenol for fever or pain  Follow up with Dr. Klein - still concerned for POTS  Hand out given on home PT  Pace activities     Follow up:  Follow up in 4 weeks or sooner if needed  

## 2020-10-23 NOTE — Assessment & Plan Note (Signed)
History of Covid 19 Tachycardia:   Stay well hydrated  Stay active  Deep breathing exercises  May start vitamin C daily, vitamin D3 daily, Zinc daily  May take tylenol for fever or pain  Follow up with Dr. Caryl Comes - still concerned for POTS  Hand out given on home PT  Pace activities     Follow up:  Follow up in 4 weeks or sooner if needed

## 2020-10-25 ENCOUNTER — Other Ambulatory Visit: Payer: Self-pay | Admitting: Allergy & Immunology

## 2020-10-25 LAB — TSH: TSH: 1.47 u[IU]/mL (ref 0.450–4.500)

## 2020-10-25 NOTE — Progress Notes (Signed)
Thank you :)

## 2020-10-26 LAB — TRYPTASE: Tryptase: 5 ug/L (ref 2.2–13.2)

## 2020-10-28 LAB — METANEPHRINES, PLASMA
Metanephrine, Free: 13.6 pg/mL (ref 0.0–88.0)
Normetanephrine, Free: 70.1 pg/mL (ref 0.0–210.1)

## 2020-10-30 ENCOUNTER — Telehealth: Payer: Self-pay | Admitting: Allergy & Immunology

## 2020-10-30 NOTE — Telephone Encounter (Signed)
Wrote note to return to work.   Salvatore Marvel, MD [facility] [place] [facility] [place]

## 2020-10-30 NOTE — Telephone Encounter (Signed)
Printed out letter and emailed it to pt per her request

## 2020-10-31 ENCOUNTER — Other Ambulatory Visit: Payer: Self-pay | Admitting: Allergy & Immunology

## 2020-11-01 LAB — METANEPHRINES, PHEOCHROMOCYT
Creatinine, Random U: 88.7 mg/dL
Metaneph Total, Ur: 59 ug/L
Metaneph/Creat Ratio: 0.3 ug/mg Creat (ref 0.0–1.0)
Normetanephrine, Ur: 131 ug/L

## 2020-11-02 ENCOUNTER — Telehealth: Payer: Self-pay | Admitting: Allergy & Immunology

## 2020-11-02 NOTE — Telephone Encounter (Signed)
Pharmacy request call from the nurse to go over medications prescribed [facility] [phone] please advise

## 2020-11-03 LAB — CATECHOLAMINE+VMA, 24-HR URINE
Dopamine , 24H Ur: 333 ug/24 hr (ref 0–510)
Dopamine, Rand Ur: 111 ug/L
Epinephrine, 24H Ur: <3 ug/24 hr (ref 0–20)
Epinephrine, Rand Ur: <1 ug/L
Norepinephrine, 24H Ur: 36 ug/24 hr (ref 0–135)
Norepinephrine, Rand Ur: 12 ug/L
VMA, 24H Ur Adult: 3.9 mg/24 hr (ref 0.0–7.5)
VMA, Urine: 1.3 mg/L

## 2020-11-03 LAB — 5 HIAA, QUANTITATIVE, URINE, 24 HOUR
5-HIAA, Ur: 2.2 mg/L
5-HIAA,Quant.,24 Hr Urine: 6.6 mg/24 hr (ref 0.0–14.9)

## 2020-11-03 NOTE — Telephone Encounter (Signed)
Spoke with [facility] pharmacist and he stated the 38g that was ordered would be 8 needle sites every 2 weeks but if she was switched to hiquvia It would be 55g every 4 weeks I told them I know nothing about any of this and would have our biologic coordinator reach out to them at  [phone]

## 2020-11-03 NOTE — Telephone Encounter (Signed)
Called and advised per plan patient has to try and fail 3 alt meds and Hyqvia not one of them and to use Hizentra as adivsed

## 2020-11-03 NOTE — Telephone Encounter (Signed)
If patient prefers to switch to weekly infusions, we can certainly do that. Maybe we will see how the first infusion goes?   Salvatore Marvel, MD [facility] [place] [facility] [place]

## 2020-11-06 NOTE — Telephone Encounter (Signed)
We will see how she does

## 2020-11-08 ENCOUNTER — Telehealth: Payer: Self-pay | Admitting: *Deleted

## 2020-11-08 NOTE — Telephone Encounter (Signed)
Patient called to discuss Hizentra and amount of needle sticks and dosing. I did advise her that we have to use 3 of 4 preferred SCIG to get any others alternatives per her Ins.  She is concerned of side effects and did not initially want to do weekly but after discussion and her past issues with medication reactions she has agreed to start with weekly and move to biweekly if she does well.  I will have to reach out to [place] to change orders for same.

## 2020-11-08 NOTE — Telephone Encounter (Signed)
Called patient and advised after our discussion you would like to decrease her dose to 400mg /kg which would be 12 gram weekly and if she does well can go to biweekly. Gave verbal to [employer] at [employer]. Patient was good with plan

## 2020-11-09 ENCOUNTER — Telehealth: Payer: Self-pay | Admitting: Nurse Practitioner

## 2020-11-09 DIAGNOSIS — J385 Laryngeal spasm: Secondary | ICD-10-CM | POA: Insufficient documentation

## 2020-11-09 DIAGNOSIS — J329 Chronic sinusitis, unspecified: Secondary | ICD-10-CM | POA: Insufficient documentation

## 2020-11-09 DIAGNOSIS — J384 Edema of larynx: Secondary | ICD-10-CM | POA: Insufficient documentation

## 2020-11-09 DIAGNOSIS — U099 Post covid-19 condition, unspecified: Secondary | ICD-10-CM | POA: Insufficient documentation

## 2020-11-09 NOTE — Progress Notes (Deleted)
PCP:  Rubie Maid, MD Primary Cardiologist: None Electrophysiologist: Virl Axe, MD   Nicole Mendoza is a 30 y.o. female seen today for Virl Axe, MD for routine electrophysiology followup.    Pt noted to have had COVID 11/2018 with worsening of her chronic pains, GI symptoms, fatigue, and palpitations.   Her symptoms palpitations with effort, dizziness, paraesthesias, shower intolerance, menses intolerances, intolerance of estrogen contraceptives due to concerns of elevated blood pressure, but apparently no concerns for PCOS.  BP has been managed by high doses of BB.  Lisinopril -> Cough Losartan -> Rash Hydrodiuril -> Hypokalemia  Since last being seen in our clinic the patient reports doing ***.  she denies chest pain, palpitations, dyspnea, PND, orthopnea, nausea, vomiting, dizziness, syncope, edema, weight gain, or early satiety.   DATE TEST EF   3/21 Echo   55-65 %           Past Medical History:  Diagnosis Date  . Allergic rhinitis   . Angio-edema   . Asthma   . Fatty liver   . GERD (gastroesophageal reflux disease)   . Hypertension   . Recurrent upper respiratory infection (URI)   . Tachycardia    Past Surgical History:  Procedure Laterality Date  . NO PAST SURGERIES      Current Outpatient Medications  Medication Sig Dispense Refill  . Ascorbic Acid (SUPER C COMPLEX PO)     . azithromycin (ZITHROMAX) 500 MG tablet Take by mouth.    . budesonide (PULMICORT) 0.5 MG/2ML nebulizer solution Take 0.5 mg by nebulization as needed (shortness of breath and wheezing).    . cetirizine (ZYRTEC) 10 MG tablet Take by mouth.    . EPINEPHrine (AUVI-Q) 0.3 mg/0.3 mL IJ SOAJ injection Use as directed for severe allergic reactions 4 each 1  . famotidine (PEPCID) 20 MG tablet TAKE 1 TABLET BY MOUTH TWICE A DAY 30 tablet 0  . ferrous sulfate 325 (65 FE) MG tablet 3 (three) times a week.     . Fluticasone Propionate (XHANCE) 93 MCG/ACT EXHU Place 2 sprays into  both nostrils 2 (two) times daily. 32 mL 5  . hydrOXYzine (ATARAX/VISTARIL) 25 MG tablet Take 1 tablet (25 mg total) by mouth 3 (three) times daily as needed. 30 tablet 1  . ipratropium (ATROVENT) 0.02 % nebulizer solution Take 2.5 mLs (0.5 mg total) by nebulization 4 (four) times daily. 75 mL 2  . ivabradine (CORLANOR) 5 MG TABS tablet Take 1 tablet (5 mg total) by mouth 2 (two) times daily with a meal. 180 tablet 3  . levalbuterol (XOPENEX HFA) 45 MCG/ACT inhaler Inhale 2 puffs into the lungs every 6 (six) hours as needed for wheezing. 1 each 2  . levalbuterol (XOPENEX) 1.25 MG/3ML nebulizer solution USE 1 VIAL BY NEBULIZATION EVERY 6 (SIX) HOURS AS NEEDED FOR WHEEZING. 90 mL 1  . metoprolol succinate (TOPROL-XL) 100 MG 24 hr tablet Take 100 mg by mouth daily. Take with or immediately following a meal.    . mometasone (ASMANEX, 30 METERED DOSES,) 220 MCG/INH inhaler Inhale 2 puffs into the lungs 2 (two) times daily. 1 each 5  . montelukast (SINGULAIR) 10 MG tablet TAKE 1 TABLET BY MOUTH EVERY DAY 90 tablet 1  . norethindrone (MICRONOR) 0.35 MG tablet Take 0.35 mg by mouth.    . ondansetron (ZOFRAN) 4 MG tablet Take 1 tablet (4 mg total) by mouth every 8 (eight) hours as needed for nausea or vomiting. 20 tablet 0  . pantoprazole (PROTONIX) 40  MG tablet Take 40 mg by mouth daily.  3  . potassium chloride (KLOR-CON) 8 MEQ tablet Take 8 mEq by mouth 2 (two) times daily.    . Probiotic Product ([place]) CAPS     . sucralfate (CARAFATE) 1 g tablet Take 1 tablet (1 g total) by mouth 4 (four) times daily -  with meals and at bedtime. 120 tablet 3   No current facility-administered medications for this visit.    Allergies  Allergen Reactions  . Budesonide-Formoterol Fumarate Other (See Comments)    Causes Hypertension  . Penicillins Hives  . Albuterol Palpitations  . Losartan Rash    Social History   Socioeconomic History  . Marital status: Married    Spouse name: Not on file   . Number of children: 0  . Years of education: Not on file  . Highest education level: Not on file  Occupational History  . Not on file  Tobacco Use  . Smoking status: Never Smoker  . Smokeless tobacco: Never Used  Vaping Use  . Vaping Use: Never used  Substance and Sexual Activity  . Alcohol use: No    Alcohol/week: 0.0 standard drinks  . Drug use: No  . Sexual activity: Yes    Birth control/protection: Pill  Other Topics Concern  . Not on file  Social History Narrative  . Not on file   Social Determinants of Health   Financial Resource Strain: Not on file  Food Insecurity: Not on file  Transportation Needs: Not on file  Physical Activity: Not on file  Stress: Not on file  Social Connections: Not on file  Intimate Partner Violence: Not on file     Review of Systems: General: No chills, fever, night sweats or weight changes  Cardiovascular:  No chest pain, dyspnea on exertion, edema, orthopnea, palpitations, paroxysmal nocturnal dyspnea Dermatological: No rash, lesions or masses Respiratory: No cough, dyspnea Urologic: No hematuria, dysuria Abdominal: No nausea, vomiting, diarrhea, bright red blood per rectum, melena, or hematemesis Neurologic: No visual changes, weakness, changes in mental status All other systems reviewed and are otherwise negative except as noted above.  Physical Exam: There were no vitals filed for this visit.  GEN- The patient is well appearing, alert and oriented x 3 today.   HEENT: normocephalic, atraumatic; sclera clear, conjunctiva pink; hearing intact; oropharynx clear; neck supple, no JVP Lymph- no cervical lymphadenopathy Lungs- Clear to ausculation bilaterally, normal work of breathing.  No wheezes, rales, rhonchi Heart- Regular rate and rhythm, no murmurs, rubs or gallops, PMI not laterally displaced GI- soft, non-tender, non-distended, bowel sounds present, no hepatosplenomegaly Extremities- no clubbing, cyanosis, or edema;  DP/PT/radial pulses 2+ bilaterally MS- no significant deformity or atrophy Skin- warm and dry, no rash or lesion Psych- euthymic mood, full affect Neuro- strength and sensation are intact  EKG {ACTION; IS/IS VEH:[id number]} ordered. Personal review of EKG from {Blank single:19197::"today","***"} shows ***  Additional studies reviewed include: ***  Assessment and Plan: Hypertension  Morbid obesity  Exertional tachypalpitations  GI symptoms question irritable bowel  As previously discussed, patient has symptoms suggestive of autonomic insufficiency with heat, menses, and at least mild orthostatic intolerance without fitting diagnostic criteria.  Reviewed importance of trigger avoidance and hydration. Salt liberalization precluded due to HTN. Consider external compression with thigh sleeves or abdominal binders as tolerated.   She has previously worn a monitor with NSR and no significant arrhythmias. Max HR 180 bpm.  GI issues ***  Encouraged weight loss.  Satira Mccallum  Sula Rumple  11/09/20 4:49 PM

## 2020-11-10 ENCOUNTER — Ambulatory Visit (INDEPENDENT_AMBULATORY_CARE_PROVIDER_SITE_OTHER): Payer: 59 | Admitting: Internal Medicine

## 2020-11-10 ENCOUNTER — Other Ambulatory Visit: Payer: Self-pay

## 2020-11-10 ENCOUNTER — Ambulatory Visit: Payer: 59 | Admitting: Student

## 2020-11-10 VITALS — BP 132/78 | HR 98 | Ht 68.0 in | Wt 287.2 lb

## 2020-11-10 DIAGNOSIS — I10 Essential (primary) hypertension: Secondary | ICD-10-CM | POA: Diagnosis not present

## 2020-11-10 DIAGNOSIS — R002 Palpitations: Secondary | ICD-10-CM | POA: Diagnosis not present

## 2020-11-10 DIAGNOSIS — Z6841 Body Mass Index (BMI) 40.0 and over, adult: Secondary | ICD-10-CM

## 2020-11-10 NOTE — Progress Notes (Signed)
Patient Care Team: [name], MD as PCP - General (Family Medicine) Deboraha Sprang, MD as PCP - Electrophysiology (Cardiology)   HPI  Nicole Mendoza is a 30 y.o. female seen in follow-up for effort associated palpitations in the setting of prior COVID and the constellation of multiple GI symptoms.  Has noted in the past that palpitations were fluid responsive; some dizziness and presyncope, shower and menses intolerance..  Also has a history of hypokalemia     On April 9th are symptom began after being diagnosed with a virus. COVID test was negative. She reports losing her taste of smell, and developing tachypalpitations particularly with standing and mild exertion w increased heart rate of 140-150. Her Compression socks stopped her increased heart rates.  She had one episode of dizziness and extremities numbness  At dinner with her husband but no syncopal episodes. This pushed her to have further evaluation with her PCP; started Metoprolol with mild relief. Also she attended physical therapy which helped.     Some chest discomfort with exertion. Last all day but she also relates this to her GERD which has been more frequent lately. Sitting reliefs her symptoms(1-2 hours). Taking Pepcid and Protonix and being managed by GI. Appointment in July.   Dyspnea on exertion  Another episode at home, she notes having some excessive sweats-her menstrual cycle worsened these symptoms. However she much better today-only mild lightheadedness going from sitting to standing position.   Some sleep apnea, complaint with use of CPAP  Stopped potassium and diuretics with some improvement  DATE TEST EF   3/21 Echo   55-65 %         Date Cr K Hgb  4/21 0.74 3.8 13.3          Records and Results Reviewed   Past Medical History:  Diagnosis Date  . Allergic rhinitis   . Angio-edema   . Asthma   . Fatty liver   . GERD (gastroesophageal reflux disease)   . Hypertension    . Recurrent upper respiratory infection (URI)   . Tachycardia     Past Surgical History:  Procedure Laterality Date  . NO PAST SURGERIES      Current Meds  Medication Sig  . Ascorbic Acid (SUPER C COMPLEX PO)   . azithromycin (ZITHROMAX) 500 MG tablet Take by mouth.  . budesonide (PULMICORT) 0.5 MG/2ML nebulizer solution Take 0.5 mg by nebulization as needed (shortness of breath and wheezing).  . cetirizine (ZYRTEC) 10 MG tablet Take by mouth.  . EPINEPHrine (AUVI-Q) 0.3 mg/0.3 mL IJ SOAJ injection Use as directed for severe allergic reactions  . famotidine (PEPCID) 20 MG tablet TAKE 1 TABLET BY MOUTH TWICE A DAY  . ferrous sulfate 325 (65 FE) MG tablet 3 (three) times a week.   . Fluticasone Propionate (XHANCE) 93 MCG/ACT EXHU Place 2 sprays into both nostrils 2 (two) times daily.  . hydrOXYzine (ATARAX/VISTARIL) 25 MG tablet Take 1 tablet (25 mg total) by mouth 3 (three) times daily as needed.  . levalbuterol (XOPENEX) 1.25 MG/3ML nebulizer solution USE 1 VIAL BY NEBULIZATION EVERY 6 (SIX) HOURS AS NEEDED FOR WHEEZING.  . metoprolol succinate (TOPROL-XL) 100 MG 24 hr tablet Take 100 mg by mouth daily. Take with or immediately following a meal.  . mometasone (ASMANEX, 30 METERED DOSES,) 220 MCG/INH inhaler Inhale 2 puffs into the lungs 2 (two) times daily.  . montelukast (SINGULAIR) 10 MG tablet TAKE 1 TABLET BY MOUTH EVERY  DAY  . norethindrone (MICRONOR) 0.35 MG tablet Take 0.35 mg by mouth.  . ondansetron (ZOFRAN) 4 MG tablet Take 1 tablet (4 mg total) by mouth every 8 (eight) hours as needed for nausea or vomiting.  . pantoprazole (PROTONIX) 40 MG tablet Take 40 mg by mouth daily.  . Probiotic Product ([place]) CAPS   . sucralfate (CARAFATE) 1 g tablet Take 1 tablet (1 g total) by mouth 4 (four) times daily -  with meals and at bedtime.    Allergies  Allergen Reactions  . Budesonide-Formoterol Fumarate Other (See Comments)    Causes Hypertension  . Penicillins  Hives  . Albuterol Palpitations  . Losartan Rash      Review of Systems negative except from HPI and PMH  Physical Exam BP 132/78   Pulse 98   Ht 5\' 8"  (1.727 m)   Wt 287 lb 3.2 oz (130.3 kg)   SpO2 99%   BMI 43.67 kg/m  Well developed and nourished in no acute distress HENT normal Neck supple with JVP-   8 cm Clear Regular rate and rhythm, 2/6  murmurs or gallops Abd-soft with active BS No Clubbing cyanosis edema Skin-warm and dry A & Oriented  Grossly normal sensory and motor function  ECG       CrCl cannot be calculated (Patient's most recent lab result is older than the maximum 21 days allowed.).   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  Does not have objective evidence today of orthostatic intolerance but is on high-dose metoprolol.  It has helped symptomatically so we will continue.  Would not initiate Corlanor.  Blood pressures are borderline elevated so pushing salt is little bit of a problem but have encouraged her to continue her fluid intake as well as to undertake exercise particularly recumbent exercise; this regard she has a recumbent bike.  Discussed the value of compression particularly thigh compression and/or abdominal compression versus calf compression; I am surprised however by the benefit she ascribes to her calf compression have encouraged her to continue.  Discussed the importance of diet and weight loss.  This will also hopefully beneficially impact her blood pressure but for right now we will continue her on metoprolol.  Discussed the fact that metoprolol i.e. beta-blockers in general, are not effective in reducing outcomes associated with hypertension and my preference would be that we would decrease the metoprolol from 100--50 and add a different medication in the event that she is unable to control her  blood pressure with interval weight loss efforts.      Current medicines are reviewed at length with the patient today .  The patient does not have concerns regarding medicines.   I,Alexis Bryant,acting as a scribe for Virl Axe, MD.,have documented all relevant documentation on the behalf of Virl Axe, MD,as directed by  Virl Axe, MD while in the presence of Virl Axe, MD.  I, Virl Axe, MD, have reviewed all documentation for this visit. The documentation on 11/10/20 for the exam, diagnosis, procedures, and orders are all accurate and complete.  Newly are not nearly as well-educated as you thought right so here is the link in which

## 2020-11-10 NOTE — Telephone Encounter (Signed)
I like that plan and am happy that the patient was pleased with it as well.  Salvatore Marvel, MD [facility]

## 2020-11-10 NOTE — Patient Instructions (Signed)

## 2020-11-12 ENCOUNTER — Telehealth: Payer: Self-pay | Admitting: Allergy & Immunology

## 2020-11-12 NOTE — Telephone Encounter (Signed)
Patient contacted me on Saturday about some shortness of breath.  She told me that his symptoms are not really getting better.  She is having an issue determining when his reflux versus vocal cord dysfunction versus POTS versus asthma.  She is hesitant to use her nebulizer because that tends to increase her heart rate.  She has not heard any wheezing in her chest.  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  She has requested physical therapy again.  She has seen both cardiology as well as the vocal cord dysfunction specialist.  He has not started speech therapy sessions just yet.  Her pulse ox has been 96% or higher.  I recommended that she try some of the breathing exercises.  I sent some to her email address.  I also recommended that she try Xopenex just to see if that helps since this is less likely to affect her heart rate.  Salvatore Marvel, MD [facility]

## 2020-11-13 ENCOUNTER — Other Ambulatory Visit: Payer: Self-pay | Admitting: Allergy & Immunology

## 2020-11-14 NOTE — Telephone Encounter (Signed)
Patient is due back 11/23/20

## 2020-11-15 ENCOUNTER — Other Ambulatory Visit: Payer: Self-pay | Admitting: Nurse Practitioner

## 2020-11-15 ENCOUNTER — Telehealth: Payer: Self-pay | Admitting: Nurse Practitioner

## 2020-11-15 DIAGNOSIS — U099 Post covid-19 condition, unspecified: Secondary | ICD-10-CM

## 2020-11-15 DIAGNOSIS — R5381 Other malaise: Secondary | ICD-10-CM

## 2020-11-15 NOTE — Telephone Encounter (Signed)
I will call her back to discuss and place referral for PT.

## 2020-11-15 NOTE — Telephone Encounter (Signed)
Called and spoke with patient. Will order PT to help with physical deconditioning.

## 2020-11-16 ENCOUNTER — Encounter: Payer: Self-pay | Admitting: Allergy & Immunology

## 2020-11-16 ENCOUNTER — Ambulatory Visit (INDEPENDENT_AMBULATORY_CARE_PROVIDER_SITE_OTHER): Payer: 59 | Admitting: Allergy & Immunology

## 2020-11-16 ENCOUNTER — Other Ambulatory Visit: Payer: Self-pay

## 2020-11-16 VITALS — BP 122/78 | HR 97 | Temp 97.9°F | Resp 18 | Ht 68.0 in | Wt 290.2 lb

## 2020-11-16 DIAGNOSIS — D808 Other immunodeficiencies with predominantly antibody defects: Secondary | ICD-10-CM

## 2020-11-16 DIAGNOSIS — J4540 Moderate persistent asthma, uncomplicated: Secondary | ICD-10-CM

## 2020-11-16 DIAGNOSIS — J302 Other seasonal allergic rhinitis: Secondary | ICD-10-CM

## 2020-11-16 DIAGNOSIS — K219 Gastro-esophageal reflux disease without esophagitis: Secondary | ICD-10-CM | POA: Diagnosis not present

## 2020-11-16 DIAGNOSIS — T7800XD Anaphylactic reaction due to unspecified food, subsequent encounter: Secondary | ICD-10-CM

## 2020-11-16 DIAGNOSIS — J3089 Other allergic rhinitis: Secondary | ICD-10-CM

## 2020-11-16 MED ORDER — EPINEPHRINE 0.3 MG/0.3ML IJ SOAJ: INTRAMUSCULAR | 1 refills | Status: DC

## 2020-11-16 MED ORDER — FLUCONAZOLE 150 MG PO TABS: 150.0000 mg | ORAL_TABLET | Freq: Every day | ORAL | 1 refills | Status: AC

## 2020-11-16 NOTE — Progress Notes (Signed)
FOLLOW UP  Date of Service/Encounter:  11/16/20   Assessment:   Mild persistent asthma - now back to being well-controlled on Asmanex BID but complicated by vocal cord dysfunction   Recurrent infections- improved following Pneumovax, but then worsened (streptococcal avidity assay abnormal), will be starting immunoglobulin therapy soon at 400 mg/kg/month equivalent  Seasonal and perennial allergic rhinitis(trees, weeds, grasses and dust mites)-not well controlled  Gastroesophageal reflux disease- onpantoprazoleand famotidineand PRN carafate  Adverse food reactions -resolvedwith minimizing exposure to food additives and avoiding peanuts/tree nuts  Snoring with perceived poor sleep quality-improved with use of the CPAP  Possible POTS syndrome   Plan/Recommendations:    1. Seasonal and perennial allergic rhinitis (trees, weeds, grasses and dust mites) - Continue with: Zyrtec (cetirizine) 10mg  tablet once daily, Singulair (montelukast) 10mg  daily + Xhance two sprays per nostril twice daily.    2. Adverse food reaction (tree nuts, peanuts)  - Continue to avoid peanuts and tree nuts.  Wynona Luna is up to date.    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   - Let me know how the infusion goes tomorrow.   4. Mild persistent asthma, uncomplicated with possible overlying vocal cord dysfunction - [name] was NORMAL.  - I hope the Speech Therapy gets approved for your VCD. - I think Pulmonary Rehab is a good decision as well. - Start fluconazole daily for one week for your oral candidiasis.  - Add Spiriva back on to see if this can help with the SOB you are experiencing.  - Daily controller medication(s): Asmanex two puffs twice daily + Singulair 10mg  + Spiriva two puffs once daily - Prior to physical activity: Xopenex 2 puffs 10-15 minutes before physical activity. - Rescue medications: Xopenex 4 puffs every 4-6 hours as  needed - Changes during respiratory infections or worsening symptoms: Add on Pulmicort 0.5mg  2-4 TIMES daily for ONE TO TWO WEEKS. - Asthma control goals:  * Full participation in all desired activities (may need albuterol before activity) * Albuterol use two time or less a week on average (not counting use with activity) * Cough interfering with sleep two time or less a month * Oral steroids no more than once a year * No hospitalizations  5. Return in about 3 months (around 02/16/2021).    Subjective:   Nicole Mendoza is a 31 y.o. female presenting today for follow up of  Chief Complaint  Patient presents with  . Asthma    Nicole Mendoza has a history of the following: Patient Active Problem List   Diagnosis Date Noted  . History of viral illness 10/16/2020  . Recurrent infections 10/02/2020  . ASCUS of cervix with negative high risk HPV 07/10/2020  . COVID-19 long hauler 06/28/2020  . Body aches 10/05/2019  . Muscle pain 10/05/2019  . Physical deconditioning 10/05/2019  . Palpitations 09/02/2019  . Cardiac murmur 09/02/2019  . History of 2019 novel coronavirus disease (COVID-19) 09/02/2019  . History of COVID-19 09/01/2019  . Tachycardia 09/01/2019  . Salivary stone 09/01/2019  . Thrush 09/01/2019  . Fatigue 09/01/2019  . Asthma 09/01/2019  . Irregular bowel habits 07/29/2019  . 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 Seasonal and perennial allergic rhinitis 01/22/2019  . Morbid obesity with BMI of 40.0-44.9, adult ([place]) 10/28/2017  . Moderate persistent asthma without complication 94/49/6759  . Mild persistent asthma without complication 16/38/4665  . Essential hypertension 04/22/2017  . Dysmenorrhea 06/26/2016  . Menorrhagia with regular cycle  06/26/2016  . Gastroesophageal reflux disease 12/22/2015  . Current use of beta blocker 12/22/2015  . Moderate persistent asthma with acute exacerbation 05/09/2015  . Acute maxillary sinusitis 05/09/2015  .  Hypertension 05/09/2015  . Allergic rhinitis 11/02/2014  . Iron deficiency anemia 11/02/2014  . Mild intermittent asthma without complication 40/03/2724  . Morbid obesity due to excess calories ([place]) 11/02/2014  . Vitamin D deficiency 11/02/2014  . Strain of back 03/25/2014    History obtained from: chart review and patient.  Nicole Mendoza is a 30 y.o. female presenting for a follow up visit.  She is well-known to this practice.  She has a history of specific antibody deficiency as well as mild persistent asthma and allergic rhinitis as well as food allergies.  At her last visit in April 2022, we continued with cetirizine, Singulair, and Xhance for her nasal symptoms.  We also recommended continued avoidance of tree nuts and peanuts.  Her asthma was complicated by what we felt was overlying vocal cord dysfunction.  Her spirometry was normal, which it had been for several weeks.  We referred her to see Dr. Rowe Clack at [facility].  We did get a chest CT and strongly recommended pulmonary rehab.  She was also going to be starting therapy and an anxiolytic.  We continued with Pulmicort twice daily as well as Singulair and Spiriva 2 puffs once daily.  Her chest CT was largely normal.  There is a tiny 0.3 cm subpleural right middle lobe nodule which was felt to be benign.  No follow-up CT was recommended without risk factors.  She also had an aberrant right subclavian artery.  Her evaluation with Dr. Rowe Clack revealed prenodular edema of the vocal cords.  Speech therapy was recommended, but I believe this is still being approved by insurance.  She did follow-up as recommended with her cardiologist, Dr. Caryl Comes.  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  She did not have orthostatic intolerance at that visit, so she cannot be diagnosed with postural orthostatic tachycardia syndrome.  However, she tells me that she certainly had it a few days before that  when she was at home.  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  There is discussion about decreasing her metoprolol eventually after she loses some weight.  She was also started on some thigh sleeves to help with the orthostatic blood pressures.  Since last visit, she has mostly done well.  She feels like some things are starting to fall into place.  She continues to have some decreased energy at times.  However, when she feels good she tends to overdo it.  Her PCP is already recommended restarting pulmonary rehab and this is pending.  She tells me that she really did enjoy her visit with Dr. Rowe Clack.  She is looking forward to starting speech therapy as well.  Overall, she does report some fatigue.  Despite getting 8 hours of sleep, she continues to feel tired in the morning.  Her CPAP monitor records no events, however.  Her CPAP seems to be working okay.  Asthma/Respiratory Symptom History: She is using Asmanex 2 puffs twice a day now.  The Pulmicort seem to be causing a lot of tachycardia, so she stopped it.  She is still having some shortness of breath that is not relieved with the the breathing exercises.  She is wondering if she needs to restart the Spiriva.  We have not  given her prednisone in quite some time.  Allergic Rhinitis Symptom History: She remains on her Xhance and cetirizine.  She has not needed antibiotics for a sinus infection, at least since the last visit.  She is going to be starting her immunoglobulin infusions.  We ended up decreasing the dose to 400 mg/kg/month equivalent so that she would have fewer sticks.  She is still nervous about it, but she is more open to it now.  Food Allergy Symptom History: She continues to avoid peanuts and tree nuts.  She is for now to call about this and has had no accidental ingestions.   She is now back at work.  We evidently filled out her FMLA forms correctly.    Otherwise, there have been no changes to her past medical history,  surgical history, family history, or social history.    Review of Systems  Constitutional: Positive for malaise/fatigue. Negative for chills, fever and weight loss.  HENT: Positive for congestion. Negative for ear discharge, ear pain and sinus pain.   Eyes: Negative for pain, discharge and redness.  Respiratory: Positive for shortness of breath. Negative for cough, sputum production and wheezing.   Cardiovascular: Negative.  Negative for chest pain and palpitations.  Gastrointestinal: Negative for abdominal pain, constipation, diarrhea, heartburn, nausea and vomiting.  Skin: Negative.  Negative for itching and rash.  Neurological: Negative for dizziness and headaches.  Endo/Heme/Allergies: Positive for environmental allergies. Does not bruise/bleed easily.       Objective:   Blood pressure 122/78, pulse 97, temperature 97.9 F (36.6 C), temperature source Temporal, resp. rate 18, height 5\' 8"  (1.727 m), weight 290 lb 3.2 oz (131.6 kg), SpO2 97 %. Body mass index is 44.12 kg/m.   Physical Exam:  Physical Exam Constitutional:      Appearance: She is well-developed.  HENT:     Head: Normocephalic and atraumatic.     Right Ear: Tympanic membrane, ear canal and external ear normal.     Left Ear: Tympanic membrane, ear canal and external ear normal.     Nose: No nasal deformity, septal deviation, mucosal edema or rhinorrhea.     Right Turbinates: Enlarged and swollen.     Left Turbinates: Enlarged and swollen.     Right Sinus: No maxillary sinus tenderness or frontal sinus tenderness.     Left Sinus: No maxillary sinus tenderness or frontal sinus tenderness.     Mouth/Throat:     Lips: Pink.     Mouth: Mucous membranes are moist. Mucous membranes are not pale and not dry.     Pharynx: Uvula midline.     Comments: Oral candidiasis present. Eyes:     General: Allergic shiner present.        Right eye: No discharge.        Left eye: No discharge.     Conjunctiva/sclera:  Conjunctivae normal.     Right eye: Right conjunctiva is not injected. No chemosis.    Left eye: Left conjunctiva is not injected. No chemosis.    Pupils: Pupils are equal, round, and reactive to light.  Cardiovascular:     Rate and Rhythm: Normal rate and regular rhythm.     Heart sounds: Normal heart sounds.  Pulmonary:     Effort: Pulmonary effort is normal. No tachypnea, accessory muscle usage or respiratory distress.     Breath sounds: Normal breath sounds. No wheezing, rhonchi or rales.     Comments: Moving air well in all lung fields.  No increased  work of breathing. Chest:     Chest wall: No tenderness.  Lymphadenopathy:     Cervical: No cervical adenopathy.  Skin:    General: Skin is warm.     Capillary Refill: Capillary refill takes less than 2 seconds.     Coloration: Skin is not pale.     Findings: No abrasion, erythema, petechiae or rash. Rash is not papular, urticarial or vesicular.     Comments: No eczematous or urticarial lesions noted.  Neurological:     Mental Status: She is alert.      Diagnostic studies:    Spirometry: results normal (FEV1: 2.83/78%, FVC: 3.51/82%, FEV1/FVC: 81%).    Spirometry consistent with normal pattern.   Allergy Studies: none       Salvatore Marvel, MD  [facility] [place] [facility] [place]

## 2020-11-16 NOTE — Patient Instructions (Addendum)
1. Seasonal and perennial allergic rhinitis (trees, weeds, grasses and dust mites) - Continue with: Zyrtec (cetirizine) 10mg  tablet once daily, Singulair (montelukast) 10mg  daily + Xhance two sprays per nostril twice daily.    2. Adverse food reaction (tree nuts, peanuts)  - Continue to avoid peanuts and tree nuts.  Nicole Mendoza is up to date.    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   - Let me know how the infusion goes tomorrow.   4. Mild persistent asthma, uncomplicated with possible overlying vocal cord dysfunction - [name] was NORMAL.  - I hope the Speech Therapy gets approved for your VCD. - I think Pulmonary Rehab is a good decision as well. - Start fluconazole daily for one week for your oral candidiasis.  - Add Spiriva back on to see if this can help with the SOB you are experiencing.  - Daily controller medication(s): Asmanex two puffs twice daily + Singulair 10mg  + Spiriva two puffs once daily - Prior to physical activity: Xopenex 2 puffs 10-15 minutes before physical activity. - Rescue medications: Xopenex 4 puffs every 4-6 hours as needed - Changes during respiratory infections or worsening symptoms: Add on Pulmicort 0.5mg  2-4 TIMES daily for ONE TO TWO WEEKS. - Asthma control goals:  * Full participation in all desired activities (may need albuterol before activity) * Albuterol use two time or less a week on average (not counting use with activity) * Cough interfering with sleep two time or less a month * Oral steroids no more than once a year * No hospitalizations  5. Return in about 3 months (around 02/16/2021).    Please inform us of any Emergency Department visits, hospitalizations, or changes in symptoms. Call us before going to the ED for breathing or allergy symptoms since we might be able to fit you in for a sick visit. Feel free to contact us anytime with any questions, problems, or concerns.  It was a pleasure to see you  again today!  Websites that have reliable patient information: 1. American Academy of Asthma, Allergy, and Immunology: www.aaaai.org 2. Food Allergy Research and Education (FARE): foodallergy.org 3. Mothers of Asthmatics: http://www.asthmacommunitynetwork.org 4. American College of Allergy, Asthma, and Immunology: www.acaai.org   COVID-19 Vaccine Information can be found at: ShippingScam.co.uk For questions related to vaccine distribution or appointments, please email [email or web address] or call [phone].   We realize that you might be concerned about having an allergic reaction to the COVID19 vaccines. To help with that concern, WE ARE OFFERING THE COVID19 VACCINES IN OUR OFFICE! Ask the front desk for dates!     "Like" [place] on Facebook and Instagram for our latest updates!      A healthy democracy works best when [employer] participate! Make sure you are registered to vote! If you have moved or changed any of your contact information, you will need to get this updated before voting!  In some cases, you MAY be able to register to vote online: [email or web address]

## 2020-11-17 ENCOUNTER — Encounter: Payer: Self-pay | Admitting: Allergy & Immunology

## 2020-11-19 ENCOUNTER — Encounter: Payer: Self-pay | Admitting: Allergy & Immunology

## 2020-11-19 DIAGNOSIS — D808 Other immunodeficiencies with predominantly antibody defects: Secondary | ICD-10-CM | POA: Insufficient documentation

## 2020-11-20 ENCOUNTER — Ambulatory Visit (INDEPENDENT_AMBULATORY_CARE_PROVIDER_SITE_OTHER): Payer: 59 | Admitting: Nurse Practitioner

## 2020-11-20 VITALS — BP 134/78 | HR 80 | Temp 97.7°F | Resp 18

## 2020-11-20 DIAGNOSIS — Z8616 Personal history of COVID-19: Secondary | ICD-10-CM

## 2020-11-20 DIAGNOSIS — Z8619 Personal history of other infectious and parasitic diseases: Secondary | ICD-10-CM

## 2020-11-20 NOTE — Assessment & Plan Note (Signed)
History of Covid 19 Tachycardia:   Stay well hydrated  Stay active  Deep breathing exercises  May start vitamin C daily, vitamin D3 daily, Zinc daily  May take tylenol for fever or pain  Follow up with Dr. Caryl Comes as schedule  Please keep appointment with PT for re-evaluation  Pace activities  Continue to follow with asthma / allergy      Follow up:  Follow up in 4 weeks or sooner if needed

## 2020-11-20 NOTE — Patient Instructions (Addendum)
History of viral illness History of Covid 19 Tachycardia:   Stay well hydrated  Stay active  Deep breathing exercises  May start vitamin C daily, vitamin D3 daily, Zinc daily  May take tylenol for fever or pain  Follow up with Dr. Caryl Comes as schedule  Please keep appointment with PT for re-evaluation  Pace activities  Continue to follow with asthma / allergy      Follow up:  Follow up in 4 weeks or sooner if needed

## 2020-11-20 NOTE — Progress Notes (Signed)
@Patient  ID: Nicole Mendoza, female    DOB: 12-10-1990, 30 y.o.   MRN: [id number]  Chief Complaint  Patient presents with  . Follow-up    Referring provider: Rubie Maid, MD  HPI  Patient presents today for post-COVID care clinic visit follow-up.  Patient was last seen in this office on 10/23/2020.  Since her last visit here patient has followed up with cardiology with Dr. Caryl Comes.  She has not been diagnosed with POTS but has not been treated in a similar fashion.  She has followed with asthma and allergy and will start immunoglobulin infusions this Friday.  She has also followed up with otolaryngology and has been referred to speech therapy for vocal cord rehabilitation.  Patient has been referred by this office to physical therapy and is scheduled to start in 2 weeks.  Overall she has been stable.  Her breathing has improved her heart rate is stable. Denies f/c/s, n/v/d, hemoptysis, PND, chest pain or edema.      Allergies  Allergen Reactions  . Budesonide-Formoterol Fumarate Other (See Comments)    Causes Hypertension  . Penicillins Hives  . Albuterol Palpitations  . Losartan Rash    Immunization History  Administered Date(s) Administered  . Pneumococcal Polysaccharide-23 05/12/2018    Past Medical History:  Diagnosis Date  . Allergic rhinitis   . Angio-edema   . Asthma   . Fatty liver   . GERD (gastroesophageal reflux disease)   . Hypertension   . Recurrent upper respiratory infection (URI)   . Tachycardia     Tobacco History: Social History   Tobacco Use  Smoking Status Never Smoker  Smokeless Tobacco Never Used   Counseling given: Yes   Outpatient Encounter Medications as of 11/20/2020  Medication Sig  . Ascorbic Acid (SUPER C COMPLEX PO)   . azithromycin (ZITHROMAX) 500 MG tablet Take by mouth.  . budesonide (PULMICORT) 0.5 MG/2ML nebulizer solution Take 0.5 mg by nebulization as needed (shortness of breath and wheezing).  . cetirizine (ZYRTEC) 10 MG  tablet Take by mouth.  . EPINEPHrine (AUVI-Q) 0.3 mg/0.3 mL IJ SOAJ injection Use as directed for severe allergic reactions  . famotidine (PEPCID) 20 MG tablet TAKE 1 TABLET BY MOUTH TWICE A DAY  . ferrous sulfate 325 (65 FE) MG tablet 3 (three) times a week.   . fluconazole (DIFLUCAN) 150 MG tablet Take 1 tablet (150 mg total) by mouth daily for 7 days.  . Fluticasone Propionate (XHANCE) 93 MCG/ACT EXHU Place 2 sprays into both nostrils 2 (two) times daily.  . hydrOXYzine (ATARAX/VISTARIL) 25 MG tablet Take 1 tablet (25 mg total) by mouth 3 (three) times daily as needed.  . levalbuterol (XOPENEX HFA) 45 MCG/ACT inhaler Inhale 2 puffs into the lungs every 6 (six) hours as needed for wheezing.  . levalbuterol (XOPENEX) 1.25 MG/3ML nebulizer solution USE 1 VIAL BY NEBULIZATION EVERY 6 (SIX) HOURS AS NEEDED FOR WHEEZING.  . metoprolol succinate (TOPROL-XL) 100 MG 24 hr tablet Take 100 mg by mouth daily. Take with or immediately following a meal.  . mometasone (ASMANEX, 30 METERED DOSES,) 220 MCG/INH inhaler Inhale 2 puffs into the lungs 2 (two) times daily.  . montelukast (SINGULAIR) 10 MG tablet TAKE 1 TABLET BY MOUTH EVERY DAY  . norethindrone (MICRONOR) 0.35 MG tablet Take 0.35 mg by mouth.  . ondansetron (ZOFRAN) 4 MG tablet Take 1 tablet (4 mg total) by mouth every 8 (eight) hours as needed for nausea or vomiting.  . pantoprazole (PROTONIX) 40 MG tablet Take  40 mg by mouth daily.  . Probiotic Product ([place]) CAPS   . SPIRIVA RESPIMAT 1.25 MCG/ACT AERS SMARTSIG:2 Puff(s) Via Inhaler Daily  . sucralfate (CARAFATE) 1 g tablet Take 1 tablet (1 g total) by mouth 4 (four) times daily -  with meals and at bedtime.   No facility-administered encounter medications on file as of 11/20/2020.     Review of Systems  Review of Systems  Constitutional: Positive for fatigue.  HENT: Negative.   Respiratory: Positive for shortness of breath. Negative for cough.   Cardiovascular: Negative.   Negative for chest pain, palpitations and leg swelling.  Gastrointestinal: Negative.   Allergic/Immunologic: Negative.   Neurological: Negative.   Psychiatric/Behavioral: Negative.        Physical Exam  BP 134/78   Pulse 80   Temp 97.7 F (36.5 C)   Resp 18   SpO2 100%   Wt Readings from Last 5 Encounters:  11/16/20 290 lb 3.2 oz (131.6 kg)  11/10/20 287 lb 3.2 oz (130.3 kg)  10/19/20 277 lb (125.6 kg)  10/05/20 288 lb 12.8 oz (131 kg)  06/20/20 290 lb (131.5 kg)     Physical Exam Vitals and nursing note reviewed.  Constitutional:      General: She is not in acute distress.    Appearance: She is well-developed.  Cardiovascular:     Rate and Rhythm: Normal rate and regular rhythm.  Pulmonary:     Effort: Pulmonary effort is normal.     Breath sounds: Normal breath sounds.  Musculoskeletal:     Right lower leg: No edema.     Left lower leg: No edema.  Neurological:     Mental Status: She is alert and oriented to person, place, and time.  Psychiatric:        Mood and Affect: Mood normal.        Behavior: Behavior normal.        Assessment & Plan:   History of viral illness History of Covid 19 Tachycardia:   Stay well hydrated  Stay active  Deep breathing exercises  May start vitamin C daily, vitamin D3 daily, Zinc daily  May take tylenol for fever or pain  Follow up with Dr. Caryl Comes as schedule  Please keep appointment with PT for re-evaluation  Pace activities  Continue to follow with asthma / allergy      Follow up:  Follow up in 4 weeks or sooner if needed       Fenton Foy, NP 11/20/2020

## 2020-11-22 ENCOUNTER — Other Ambulatory Visit: Payer: Self-pay

## 2020-11-22 ENCOUNTER — Ambulatory Visit (INDEPENDENT_AMBULATORY_CARE_PROVIDER_SITE_OTHER): Payer: 59 | Admitting: Family Medicine

## 2020-11-22 ENCOUNTER — Encounter: Payer: Self-pay | Admitting: Family Medicine

## 2020-11-22 VITALS — BP 130/70 | HR 93 | Temp 97.4°F | Ht 68.0 in | Wt 287.4 lb

## 2020-11-22 DIAGNOSIS — I10 Essential (primary) hypertension: Secondary | ICD-10-CM

## 2020-11-22 DIAGNOSIS — J3089 Other allergic rhinitis: Secondary | ICD-10-CM | POA: Diagnosis not present

## 2020-11-22 DIAGNOSIS — Z1159 Encounter for screening for other viral diseases: Secondary | ICD-10-CM

## 2020-11-22 DIAGNOSIS — J4540 Moderate persistent asthma, uncomplicated: Secondary | ICD-10-CM

## 2020-11-22 DIAGNOSIS — J302 Other seasonal allergic rhinitis: Secondary | ICD-10-CM

## 2020-11-22 DIAGNOSIS — D2261 Melanocytic nevi of right upper limb, including shoulder: Secondary | ICD-10-CM | POA: Insufficient documentation

## 2020-11-22 DIAGNOSIS — Z114 Encounter for screening for human immunodeficiency virus [HIV]: Secondary | ICD-10-CM

## 2020-11-22 DIAGNOSIS — K219 Gastro-esophageal reflux disease without esophagitis: Secondary | ICD-10-CM

## 2020-11-22 DIAGNOSIS — D808 Other immunodeficiencies with predominantly antibody defects: Secondary | ICD-10-CM

## 2020-11-22 NOTE — Progress Notes (Signed)
[facility] [address] [place] [address] Dept: [phone] Dept Fax: [phone]  Transfer of Care Office Visit  Subjective:    Patient ID: Nicole Mendoza, female    DOB: 23-Nov-1990, 30 y.o..   MRN: [id number]  Chief Complaint  Patient presents with  . Establish Care    NP- establish care.       History of Present Illness:  Patient is in today to establish care. Nicole Mendoza is originally fromt his area, living currently in [place]. She is married. She and her husband are foster parents and having permanent custody of a 30year-old boy. 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 She denies any tobacco, alcohol, or drug use.  Nicole Mendoza has a bit of a complicated medical history. She has a history of hypertension. She is on metoprolol, but this was primarily prescribed for dealing with tachycardia issues that she developed in her early 16s. Currently, she does deal with some POTS symptoms, though has not been formally diagnosed with this. The POTS symptoms started after she developed COVID. She also had some lingering pulmonary symptoms after COVID and is followed in the Post-COVID Pulmonary Clinic. She had a prior history of asthma and does see Dr. Ernst Bowler (allergist) related to this. She is treated with a combination of therapies for her allergies and asthma. This includes Xopenox (inhaler and nebs), Asmanex, and Singulair. She also occasionally uses a budesonide neb, but tries to avoid this as it messes with her POTS symptoms. For her allergies, she also uses cetirizine, and a fluticasone nasal spray Truett Perna). she has PRN hydroxyzine for itching and occasioanl anxiety.  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 She is currently being managed with azithromycin prophylaxis (500 mg M,W,F). She will be soon starting  hizentra 12 mg weekly, which is an immunoglobulin therapy.  Nicole Mendoza has a history of significant GERD. She is Protonix, Pepcid, and completing a course of Carafate. She had a recent exacerbation that she relates to being a course of prednisone.  Nicole Mendoza had a history of menorrhagia and has had associated iron deficiency anemia. She is on iron supplements 3 days a week. She also is on Micronor to help regulate her periods. She does follow with GYN for her women's health. She notes she had a pap smear in Jan. Due to a prior issues with ASCUS, she is having her paps every 2 years.  Past Medical History: Patient Active Problem List   Diagnosis Date Noted  . 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 Recurrent sinusitis 11/09/2020  . Pre-nodular edema of the vocal folds 11/09/2020  . Laryngospasms 11/09/2020  . ASCUS of cervix with negative high risk HPV 07/10/2020  . Palpitations 09/02/2019  . Cardiac murmur 09/02/2019  . History of COVID-19 09/01/2019  . Tachycardia 09/01/2019  . Salivary stone 09/01/2019  . 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 Seasonal and perennial allergic rhinitis 01/22/2019  . Morbid obesity with BMI of 40.0-44.9, adult ([facility]) 10/28/2017  . Moderate persistent asthma without complication 59/16/3846  . Essential hypertension 04/22/2017  . Dysmenorrhea 06/26/2016  . Menorrhagia with regular cycle 06/26/2016  . Gastroesophageal reflux disease 12/22/2015  . Iron deficiency anemia 11/02/2014  . Vitamin D deficiency 11/02/2014  Past Surgical History:  Procedure Laterality Date  . NO PAST SURGERIES     Family History  Problem Relation Age of Onset  . Allergic rhinitis Father   . Asthma Father   . Allergic rhinitis Sister   . Asthma Sister   . Angioedema Neg Hx   . Atopy Neg Hx   . Eczema Neg Hx   .  Immunodeficiency Neg Hx   . Urticaria Neg Hx    Outpatient Medications Prior to Visit  Medication Sig Dispense Refill  . Ascorbic Acid (SUPER C COMPLEX PO)     . azithromycin (ZITHROMAX) 500 MG tablet Take by mouth. 3 times a week    . budesonide (PULMICORT) 0.5 MG/2ML nebulizer solution Take 0.5 mg by nebulization as needed (shortness of breath and wheezing).    . cetirizine (ZYRTEC) 10 MG tablet Take by mouth.    . EPINEPHrine (AUVI-Q) 0.3 mg/0.3 mL IJ SOAJ injection Use as directed for severe allergic reactions 4 each 1  . famotidine (PEPCID) 20 MG tablet TAKE 1 TABLET BY MOUTH TWICE A DAY 30 tablet 0  . ferrous sulfate 325 (65 FE) MG tablet 3 (three) times a week.     . fluconazole (DIFLUCAN) 150 MG tablet Take 1 tablet (150 mg total) by mouth daily for 7 days. 7 tablet 1  . Fluticasone Propionate (XHANCE) 93 MCG/ACT EXHU Place 2 sprays into both nostrils 2 (two) times daily. 32 mL 5  . hydrOXYzine (ATARAX/VISTARIL) 25 MG tablet Take 1 tablet (25 mg total) by mouth 3 (three) times daily as needed. 30 tablet 1  . levalbuterol (XOPENEX HFA) 45 MCG/ACT inhaler Inhale 2 puffs into the lungs every 6 (six) hours as needed for wheezing. 1 each 2  . levalbuterol (XOPENEX) 1.25 MG/3ML nebulizer solution USE 1 VIAL BY NEBULIZATION EVERY 6 (SIX) HOURS AS NEEDED FOR WHEEZING. 90 mL 1  . metoprolol succinate (TOPROL-XL) 100 MG 24 hr tablet Take 100 mg by mouth daily. Take with or immediately following a meal.    . mometasone (ASMANEX, 30 METERED DOSES,) 220 MCG/INH inhaler Inhale 2 puffs into the lungs 2 (two) times daily. 1 each 5  . montelukast (SINGULAIR) 10 MG tablet TAKE 1 TABLET BY MOUTH EVERY DAY 90 tablet 1  . norethindrone (MICRONOR) 0.35 MG tablet Take 0.35 mg by mouth.    . ondansetron (ZOFRAN) 4 MG tablet Take 1 tablet (4 mg total) by mouth every 8 (eight) hours as needed for nausea or vomiting. 20 tablet 0  . pantoprazole (PROTONIX) 40 MG tablet Take 40 mg by mouth daily.  3  . Prenatal  Vit-Fe Fumarate-FA (MULTIVITAMIN-PRENATAL) 27-0.8 MG TABS tablet Take 1 tablet by mouth daily at 12 noon.    . Probiotic Product ([place]) CAPS     . SPIRIVA RESPIMAT 1.25 MCG/ACT AERS SMARTSIG:2 Puff(s) Via Inhaler Daily    . sucralfate (CARAFATE) 1 g tablet Take 1 tablet (1 g total) by mouth 4 (four) times daily -  with meals and at bedtime. 120 tablet 3   No facility-administered medications prior to visit.   Allergies  Allergen Reactions  . Budesonide-Formoterol Fumarate Other (See Comments)    Causes Hypertension  . Penicillins Hives  . Albuterol Palpitations  . Losartan Rash     Objective:   Today's Vitals   11/22/20 1412  BP: 130/70  Pulse: 93  Temp: (!) 97.4 F (36.3 C)  TempSrc: Temporal  SpO2: 98%  Weight: 287 lb 6.4 oz (130.4 kg)  Height: 5'  8" (1.727 m)   Body mass index is 43.7 kg/m.   General: Well developed, well nourished. No acute distress. Lungs: Clear to auscultation bilaterally. No wheezing, rales or rhonchi. Mildly prolonged expiratory phase. Skin: Warm and dry.  There is a 2 cm ovate brown pigmented nevus with localized long hair growth over the right forearm. Psych: Alert and oriented. Normal mood and affect.  Health Maintenance Due  Topic Date Due  . Pneumococcal Vaccine 13-61 Years old (1 of 4 - PCV13) Never done  . Hepatitis C Screening  Never done  . PAP-Cervical Cytology Screening  Never done  . COVID-19 Vaccine (3 - Pfizer risk 4-dose series) 04/21/2020     Assessment & Plan:   1. Essential hypertension Ms. Kinne blood pressure is adequately controlled at this point. We will monitor this for now.  2. Moderate persistent asthma without complication On Xopenox, Asmanex, and Singulair. Well controlled at this point.  3. Seasonal and perennial allergic rhinitis On fluticasone and cetirizine. Well controlled currently.  4. Gastroesophageal reflux disease without esophagitis Stable on Protonix and Pepcid. She will complete her  course of Carafate.  5. 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  6. Encounter for hepatitis C screening test for low risk patient  - HCV Ab w Reflex to Quant PCR; Future  7. Screening for HIV (human immunodeficiency virus)  - HIV Antibody (routine testing w rflx); Future   [name], MD

## 2020-11-23 ENCOUNTER — Other Ambulatory Visit: Payer: Self-pay | Admitting: Allergy & Immunology

## 2020-11-24 ENCOUNTER — Telehealth: Payer: Self-pay | Admitting: Neurology

## 2020-11-24 NOTE — Telephone Encounter (Signed)
Pt called, sleep apnea effect me during the day, longer to awake up, sleepiness. Would like a call from the nurse

## 2020-11-27 ENCOUNTER — Other Ambulatory Visit: Payer: Self-pay | Admitting: Neurology

## 2020-11-27 ENCOUNTER — Encounter: Payer: Self-pay | Admitting: Neurology

## 2020-11-27 DIAGNOSIS — R0681 Apnea, not elsewhere classified: Secondary | ICD-10-CM

## 2020-11-27 DIAGNOSIS — G4719 Other hypersomnia: Secondary | ICD-10-CM

## 2020-11-27 DIAGNOSIS — G4733 Obstructive sleep apnea (adult) (pediatric): Secondary | ICD-10-CM

## 2020-11-27 NOTE — Telephone Encounter (Signed)
Per Amy she agreed with plan below. Orders have been placed and sent to [place] for the patient. Patient made aware and agrees with plan also

## 2020-11-27 NOTE — Telephone Encounter (Signed)
Called the patient back and discussed her concerns. She states in April she was sick and was having some difficulty with her machine.  She was having a hard time feeling like she was not getting enough pressure which in turn was causing her to wake up sleepy.  That has resolved but she has found that she is waking up feeling like she used to before she started CPAP.  She indicated feeling exhausted upon waking and admits that her movements that she would wake up gasping for air and when checking her oxygen level it was dropping down into the 70's.  In reviewing her CPAP Download the events look well treated but she is maximizing the pressure set on her machine.  Advised the patient I would discuss this with Amy and that we will be in touch with recommendations. Informed most likely she may want to increase the maximum pressure on the CPAP as well as complete a overnight oximetry test on CPAP.  Informed the patient I would verify that this and contact her through MyChart to inform her if family agrees with this plan or has other recommendations. Pt verbalized understanding.

## 2020-11-29 ENCOUNTER — Ambulatory Visit: Payer: 59 | Attending: Nurse Practitioner

## 2020-11-29 ENCOUNTER — Other Ambulatory Visit: Payer: Self-pay

## 2020-11-29 DIAGNOSIS — Z8616 Personal history of COVID-19: Secondary | ICD-10-CM | POA: Insufficient documentation

## 2020-11-29 DIAGNOSIS — R5381 Other malaise: Secondary | ICD-10-CM | POA: Diagnosis present

## 2020-11-29 DIAGNOSIS — U099 Post covid-19 condition, unspecified: Secondary | ICD-10-CM | POA: Insufficient documentation

## 2020-11-30 NOTE — Therapy (Signed)
[facility] [place], [place], [address] Phone: [phone]   Fax:  [phone]  Physical Therapy Evaluation  Patient Details  Name: Nicole Mendoza MRN: [id number] Date of Birth: 1990-10-22 Referring Provider (PT): Fenton Foy, NP   Encounter Date: 11/29/2020   PT End of Session - 11/30/20 1621     Visit Number 1    Number of Visits 17    Date for PT Re-Evaluation 02/03/21    Authorization Type [employer] OTHER    Authorization Time Period Complete FOTO on the 5th and 10th visits    Progress Note Due on Visit 10    PT Start Time 0937    PT Stop Time 1020    PT Time Calculation (min) 43 min    Activity Tolerance Patient tolerated treatment well    Behavior During Therapy WFL for tasks assessed/performed             Past Medical History:  Diagnosis Date   Allergic rhinitis    Allergy    Angio-edema    Asthma    Fatty liver    GERD (gastroesophageal reflux disease)    Hypertension    Recurrent upper respiratory infection (URI)    Tachycardia     Past Surgical History:  Procedure Laterality Date   NO PAST SURGERIES      There were no vitals filed for this visit.    Subjective Assessment - 11/29/20 0942     Subjective Pt reports being 09/23/20 and was out of work for 5 weeks. Her COVID test was neg, but she lost sense of taste and smell. Pt notes her cardiologist is treating as if she has POTS. Pt has a recumbent bicycle. She states her upper body is weak, she is starting plasma immunoglobulin infusions to boost her immunity, and she is having a pulse ox test tonight because she is waking up very groggy in the mornings..    Diagnostic tests Pt reports no intersitial lung disease    Patient Stated Goals I want to improve my endurance, to play with my 30 yo, and to be able to go to the grocery store    Currently in Pain? No/denies                [facility] PT Assessment - 11/30/20 0001        Assessment   Medical Diagnosis COVID-19 long hauler; Physical deconditioning    Referring Provider (PT) Fenton Foy, NP    Hand Dominance Right    Prior Therapy yes      Precautions   Precautions Other (comment)   POTS- limit exercises to siting or lying down initially and progress to standing     Restrictions   Weight Bearing Restrictions Yes      Balance Screen   Has the patient fallen in the past 6 months No      [place] residence    Living Arrangements Spouse/significant other;Children    Type of [place] Access Level entry    [place] Two level    Alternate Level Stairs-Number of Steps 22 steps    Alternate Level Stairs-Rails Right;Left      Prior Function   Level of Independence Independent    Vocation 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      Transfers   Transfers Sit to Stand;Stand to Sit    Sit to Stand 7: Independent    Five time sit to stand comments  29.3 sec c use of hands      Ambulation/Gait   Ambulation/Gait Yes    Ambulation/Gait Assistance 7: Independent    Gait Pattern Step-through pattern    Gait Comments 6 MWT= 755ft c 1 rest break; after walk O2 sat 95%, HR 104. Pt reports a Somewhat Hard exertion level ([place]) with LE fatigue the biggest issue vs. DOE                         Objective measurements completed on examination: See above findings.               PT Education - 11/30/20 1618     Education Details Eval findings, POC, HEP- To walk distances as tolerated every 1 to 2 hours not exceeding a Somewhat Hard exertion level    Person(s) Educated Patient    Methods Explanation    Comprehension Verbalized understanding              PT Short Term Goals - 11/30/20 1653       PT SHORT TERM GOAL #1   Title Pt will be Ind c a HEP to address endurance and strength.    Baseline Started on eval    Status New    Target Date 12/21/20               PT Long Term Goals - 11/30/20 1654       PT LONG TERM GOAL #1   Title Pt will be Ind in a final HEP to maintain or progress achieved LOF    Status New    Target Date 01/27/21      PT LONG TERM GOAL #2   Title Pt will demonstrate an improved 6 min walking test to 1300 ft. to be able to better participate in ADLs, play with her son, and shopping involving prolonged walking    Baseline 762ft    Status New    Target Date 01/27/21      PT LONG TERM GOAL #3   Title Pt will demonstrate an improved STS to 15 sec as demonstration of improved LE strength and activity tolerance    Baseline 29.31    Status New      PT LONG TERM GOAL #4   Title Increase pt's bilat hip strength to 4+/5 or greater for improved tolerance to functional mobility    Status New    Target Date 01/27/21      PT LONG TERM GOAL #5   Title Improve pt's FOTO score to predicted value of 65% functional ability    Baseline 47%    Status New    Target Date 01/27/21  Plan - 11/30/20 1624     Clinical Impression Statement Pt presents with decreased tolerance to activity following a episode of an URI which may have been covid related. Pt reports testing neg, but she lost her sense of taste and smell. Pt was previously seen by this clinic for rehab with long hauler covid  symptoms 1 year ago and responded well with improve activity tolerance. Pt's 6MWT and 5xSTS were both decreased. With the 6MWT the pt reports issues most significantly with LE fatigue vs. DOE. Adittionally, pt's hips were found to be weak. Pt will benefit from PT 2w8 to address deficits and optimize her functional mobility.    Personal Factors and Comorbidities Past/Current Experience;Comorbidity 1;Comorbidity 2;Comorbidity 3+    Comorbidities asthma, recurrent URI, tachycardia, obesity, CPAP use    Examination-Activity Limitations Locomotion Level;Squat;Stand    Examination-Participation Restrictions Occupation;Other   playing with son   Stability/Clinical Decision Making Unstable/Unpredictable    Clinical Decision Making Moderate    Rehab Potential Good    PT Frequency 2x / week    PT Duration 8 weeks    PT Treatment/Interventions ADLs/Self Care Home Management;Balance training;Therapeutic exercise;Therapeutic activities;Functional mobility training;Stair training;Gait training;Patient/family education    PT Next Visit Plan Assess respnose to HEP. 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   Recurrent sinusitis 11/09/2020   Pre-nodular edema of the vocal folds 11/09/2020   Laryngospasms 11/09/2020   ASCUS of cervix with  negative high risk HPV 07/10/2020   Palpitations 09/02/2019   Cardiac murmur 09/02/2019   History of COVID-19 09/01/2019   Tachycardia 09/01/2019   Salivary stone 09/01/2019   Anaphylactic shock due to adverse food reaction 01/22/2019   Seasonal and perennial allergic rhinitis 01/22/2019   Morbid obesity with BMI of 40.0-44.9, adult ([place]) 10/28/2017   Moderate persistent asthma without complication 63/78/5885   Essential hypertension 04/22/2017   Dysmenorrhea 06/26/2016   Menorrhagia with regular cycle 06/26/2016   Gastroesophageal reflux disease 12/22/2015   Iron deficiency anemia 11/02/2014   Vitamin D deficiency 11/02/2014    Gar Ponto MS, PT 11/30/20 5:08 PM   [facility] [address] [place], [place], [address] Phone: [phone]   Fax:  [phone]  Name: Abbygail [place] MRN: [id number] Date of Birth: 04/10/1991

## 2020-12-02 ENCOUNTER — Other Ambulatory Visit: Payer: Self-pay | Admitting: Allergy & Immunology

## 2020-12-06 ENCOUNTER — Telehealth: Payer: Self-pay | Admitting: *Deleted

## 2020-12-06 ENCOUNTER — Ambulatory Visit: Payer: 59

## 2020-12-06 ENCOUNTER — Other Ambulatory Visit: Payer: Self-pay | Admitting: Allergy & Immunology

## 2020-12-06 ENCOUNTER — Other Ambulatory Visit: Payer: Self-pay

## 2020-12-06 VITALS — HR 80

## 2020-12-06 DIAGNOSIS — R5381 Other malaise: Secondary | ICD-10-CM

## 2020-12-06 DIAGNOSIS — U099 Post covid-19 condition, unspecified: Secondary | ICD-10-CM

## 2020-12-06 DIAGNOSIS — Z8616 Personal history of COVID-19: Secondary | ICD-10-CM

## 2020-12-06 NOTE — Telephone Encounter (Signed)
Per Dr Ernst Bowler reached out to patient regarding her Hizentra infusions. She has pushed back starting same due to past issues with sensitivity to medications in the past.  I did advise her that most patients do well on this medication and most common side effects I usually hear about are site irritation and fatigue if patients dont hydrate well during infusions.  I also provided her with number to Hizentra Patient Program that she can reach out to and speak with Hizentra nurse or patient advocate.  She does have infusion planned for tomorrow and advised she would go ahead with this one and see how she does.

## 2020-12-06 NOTE — Therapy (Signed)
[place], [place], [address] Phone: [phone]   Fax:  [phone]  Physical Therapy Treatment  Patient Details  Name: Nicole Mendoza MRN: [id number] Date of Birth: [birth year] Referring Provider (PT): Fenton Foy, NP   Encounter Date: 12/06/2020   PT End of Session - 12/06/20 0736     Visit Number 2    Number of Visits 17    Date for PT Re-Evaluation 02/03/21    Authorization Type [employer] OTHER    Authorization Time Period Complete FOTO on the 5th and 10th visits    Progress Note Due on Visit 10    PT Start Time 0725   pt was 10 mins late   PT Stop Time 0805    PT Time Calculation (min) 40 min    Activity Tolerance Patient tolerated treatment well    Behavior During Therapy WFL for tasks assessed/performed             Past Medical History:  Diagnosis Date   Allergic rhinitis    Allergy    Angio-edema    Asthma    Fatty liver    GERD (gastroesophageal reflux disease)    Hypertension    Recurrent upper respiratory infection (URI)    Tachycardia     Past Surgical History:  Procedure Laterality Date   NO PAST SURGERIES      Vitals:   12/06/20 0735  Pulse: 80  SpO2: 99%     Subjective Assessment - 12/06/20 0732     Subjective Pt reports this has been a good week for her. Pt reports she has started taking spiriva, which she thinks has helped with her breathing. Pt reports compliance with walking every 2 hours which is limited c general fatigue and some SOB. I feel better when I'm active.    Patient Stated Goals I want to improve my endurance, to play with my 30 yo, and to be able to go to the grocery store    Currently in Pain? No/denies                               [facility] Adult PT Treatment/Exercise - 12/06/20 0001       Exercises   Exercises Knee/Hip      Knee/Hip Exercises: Aerobic   Nustep L3; 10 mins   post ex: O2 sat 98%, HR 98, Somewhat  hard exertion reported     Knee/Hip Exercises: Seated   Long Arc Quad Right;Left;2 sets;10 reps    Long Arc Quad Weight 3 lbs.      Knee/Hip Exercises: Supine   Bridges Both;2 sets;10 reps    Straight Leg Raises Right;Left;2 sets    Straight Leg Raises Limitations 8 reps      Knee/Hip Exercises: Sidelying   Hip ABduction Right;Left;2 sets    Hip ABduction Limitations 8 reps    Clams R and L; 2 sets, 10 reps                    PT Education - 12/06/20 1247     Education Details HEP for LE strengthening. Complete LE exs every other day.    Person(s) Educated Patient    Methods Explanation;Demonstration;Tactile cues;Verbal cues;Handout    Comprehension Verbalized understanding;Returned demonstration;Verbal cues required;Tactile cues required;Need further instruction              PT Short Term Goals - 11/30/20 1653  PT SHORT TERM GOAL #1   Title Pt will be Ind c a HEP to address endurance and strength.    Baseline Started on eval    Status New    Target Date 12/21/20               PT Long Term Goals - 11/30/20 1654       PT LONG TERM GOAL #1   Title Pt will be Ind in a final HEP to maintain or progress achieved LOF    Status New    Target Date 01/27/21      PT LONG TERM GOAL #2   Title Pt will demonstrate an improved 6 min walking test to 1300 ft. to be able to better participate in ADLs, play with her son, and shopping involving prolonged walking    Baseline 732ft    Status New    Target Date 01/27/21      PT LONG TERM GOAL #3   Title Pt will demonstrate an improved STS to 15 sec as demonstration of improved LE strength and activity tolerance    Baseline 29.31    Status New      PT LONG TERM GOAL #4   Title Increase pt's bilat hip strength to 4+/5 or greater for improved tolerance to functional mobility    Status New    Target Date 01/27/21      PT LONG TERM GOAL #5   Title Improve pt's FOTO score to predicted value of 65% functional  ability    Baseline 47%    Status New    Target Date 01/27/21                   Plan - 12/06/20 0752     Clinical Impression Statement PT was completed to address activity tolerance and LE strength. Pt's O2 sat remained at a good level with NuStep. Pt reported LE fatigue with the therex for LE strengthening. A HEP for LE strengthening was provided to the pt. Pt returned demonstration of the HEP. Pt tolerated the session without adverse effects.    Personal Factors and Comorbidities Past/Current Experience;Comorbidity 1;Comorbidity 2;Comorbidity 3+    Comorbidities asthma, recurrent URI, tachycardia, obesity, CPAP use    Examination-Activity Limitations Locomotion Level;Squat;Stand    Examination-Participation Restrictions Occupation;Other    Stability/Clinical Decision Making Unstable/Unpredictable    Clinical Decision Making Moderate    Rehab Potential Good    PT Frequency 2x / week    PT Duration 8 weeks    PT Treatment/Interventions ADLs/Self Care Home Management;Balance training;Therapeutic exercise;Therapeutic activities;Functional mobility training;Stair training;Gait training;Patient/family education    PT Next Visit Plan Progress ther ex to address activity tolerance and strength. 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   Recurrent sinusitis 11/09/2020   Pre-nodular edema of  the vocal folds 11/09/2020   Laryngospasms 11/09/2020   ASCUS of cervix with negative high risk HPV 07/10/2020   Palpitations 09/02/2019   Cardiac murmur 09/02/2019   History of COVID-19 09/01/2019   Tachycardia 09/01/2019   Salivary stone 09/01/2019   Anaphylactic shock due to adverse food reaction 01/22/2019   Seasonal and perennial allergic rhinitis 01/22/2019   Morbid obesity with BMI of 40.0-44.9, adult ([place]) 10/28/2017   Moderate persistent asthma without complication 31/05/1623   Essential hypertension 04/22/2017   Dysmenorrhea 06/26/2016   Menorrhagia with regular cycle 06/26/2016   Gastroesophageal reflux disease 12/22/2015   Iron deficiency anemia 11/02/2014   Vitamin D deficiency 11/02/2014    Gar Ponto MS, PT 12/06/20 12:55 PM   [facility] [address] [place], [place], [address] Phone: [phone]   Fax:  [phone]  Name: Nicole Mendoza MRN: [id number] Date of Birth: 08-Mar-1991

## 2020-12-08 ENCOUNTER — Other Ambulatory Visit: Payer: Self-pay

## 2020-12-08 ENCOUNTER — Ambulatory Visit: Payer: 59

## 2020-12-08 VITALS — HR 82

## 2020-12-08 DIAGNOSIS — U099 Post covid-19 condition, unspecified: Secondary | ICD-10-CM | POA: Diagnosis not present

## 2020-12-08 DIAGNOSIS — R5381 Other malaise: Secondary | ICD-10-CM

## 2020-12-08 DIAGNOSIS — Z8616 Personal history of COVID-19: Secondary | ICD-10-CM

## 2020-12-08 LAB — SAR COV2 SEROLOGY (COVID19)AB(IGG),IA
SARS-CoV-2 Semi-Quant IgG Ab: 435 AU/mL (ref <13.0)
SARS-CoV-2 Spike Ab Interp: POSITIVE

## 2020-12-08 NOTE — Therapy (Signed)
[name], [place], [address] Phone: [phone]   Fax:  [phone]  Physical Therapy Treatment  Patient Details  Name: Nicole Mendoza MRN: [id number] Date of Birth: [birth year] Referring Provider (PT): Fenton Foy, NP   Encounter Date: 12/08/2020   PT End of Session - 12/08/20 0730     Visit Number 3    Number of Visits 17    Date for PT Re-Evaluation 02/03/21    Authorization Type [employer] OTHER    Authorization Time Period Complete FOTO on the 5th and 10th visits    Progress Note Due on Visit 10    PT Start Time 0726    PT Stop Time 0804    PT Time Calculation (min) 38 min    Activity Tolerance Patient tolerated treatment well    Behavior During Therapy WFL for tasks assessed/performed             Past Medical History:  Diagnosis Date   Allergic rhinitis    Allergy    Angio-edema    Asthma    Fatty liver    GERD (gastroesophageal reflux disease)    Hypertension    Recurrent upper respiratory infection (URI)    Tachycardia     Past Surgical History:  Procedure Laterality Date   NO PAST SURGERIES      Vitals:   12/08/20 0741  Pulse: 82  SpO2: 98%     Subjective Assessment - 12/08/20 0734     Subjective Pt report she has had some leg soreness, but is doing well. She reports walking alot yesterday and had a good day.    Diagnostic tests Pt reports no intersitial lung disease    Patient Stated Goals I want to improve my endurance, to play with my 30 yo, and to be able to go to the grocery store    Currently in Pain? No/denies                               [facility] Adult PT Treatment/Exercise - 12/08/20 0001       Exercises   Exercises Knee/Hip      Knee/Hip Exercises: Aerobic   Nustep L3; 10 mins   post ex: O2 sat 98%, HR , Somewhat hard exertion reported     Knee/Hip Exercises: Seated   Long Arc Quad Right;Left;2 sets;10 reps    Long Arc  Quad Weight 3 lbs.      Knee/Hip Exercises: Supine   Bridges --    Straight Leg Raises Right;Left;2 sets    Straight Leg Raises Limitations 8 reps    Other Supine Knee/Hip Exercises PPT; 10x, 3 sec      Knee/Hip Exercises: Sidelying   Hip ABduction Right;Left;2 sets    Hip ABduction Limitations 8 reps    Clams R and L; 2 sets, 10 reps                      PT Short Term Goals - 11/30/20 1653       PT SHORT TERM GOAL #1   Title Pt will be Ind c a HEP to address endurance and strength.    Baseline Started on eval    Status New    Target Date 12/21/20               PT Long Term Goals - 11/30/20 1654  PT LONG TERM GOAL #1   Title Pt will be Ind in a final HEP to maintain or progress achieved LOF    Status New    Target Date 01/27/21      PT LONG TERM GOAL #2   Title Pt will demonstrate an improved 6 min walking test to 1300 ft. to be able to better participate in ADLs, play with her son, and shopping involving prolonged walking    Baseline 717ft    Status New    Target Date 01/27/21      PT LONG TERM GOAL #3   Title Pt will demonstrate an improved STS to 15 sec as demonstration of improved LE strength and activity tolerance    Baseline 29.31    Status New      PT LONG TERM GOAL #4   Title Increase pt's bilat hip strength to 4+/5 or greater for improved tolerance to functional mobility    Status New    Target Date 01/27/21      PT LONG TERM GOAL #5   Title Improve pt's FOTO score to predicted value of 65% functional ability    Baseline 47%    Status New    Target Date 01/27/21                   Plan - 12/08/20 0749     Clinical Impression Statement Pt reponded well to her initial PT session for LE strengthening and reports continued improvement in walking tolerance at home. With cardiopulmonary ex. on the Nu-step, Pt's response has been good with a mod. increase in HR and no significant change in O2 sat. Abdominal strengthening was  completed and addedto HEP. Pt will continue to benefit from PT to improve strength, activity tolerance, minimize fatigue to optimize her functional ability.    Personal Factors and Comorbidities Past/Current Experience;Comorbidity 1;Comorbidity 2;Comorbidity 3+    Comorbidities asthma, recurrent URI, tachycardia, obesity, CPAP use    Examination-Activity Limitations Locomotion Level;Squat;Stand    Examination-Participation Restrictions Occupation;Other    Stability/Clinical Decision Making Unstable/Unpredictable    Clinical Decision Making Moderate    Rehab Potential Good    PT Frequency 2x / week    PT Duration 8 weeks    PT Treatment/Interventions ADLs/Self Care Home Management;Balance training;Therapeutic exercise;Therapeutic activities;Functional mobility training;Stair training;Gait training;Patient/family education    PT Next Visit Plan Progress ther ex to address activity tolerance and strength. 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   Recurrent sinusitis 11/09/2020   Pre-nodular edema of the vocal folds 11/09/2020   Laryngospasms 11/09/2020   ASCUS of cervix with negative high risk HPV 07/10/2020   Palpitations 09/02/2019   Cardiac murmur 09/02/2019   History of COVID-19 09/01/2019   Tachycardia  09/01/2019   Salivary stone 09/01/2019   Anaphylactic shock due  to adverse food reaction 01/22/2019   Seasonal and perennial allergic rhinitis 01/22/2019   Morbid obesity with BMI of 40.0-44.9, adult ([place]) 10/28/2017   Moderate persistent asthma without complication 27/25/3664   Essential hypertension 04/22/2017   Dysmenorrhea 06/26/2016   Menorrhagia with regular cycle 06/26/2016   Gastroesophageal reflux disease 12/22/2015   Iron deficiency anemia 11/02/2014   Vitamin D deficiency 11/02/2014   Gar Ponto MS, PT 12/08/20 8:15 AM   [facility] [address] [place], [place], [address] Phone: [phone]   Fax:  [phone]  Name: Teressa Mcglocklin MRN: [id number] Date of Birth: [birth year]

## 2020-12-08 NOTE — Telephone Encounter (Signed)
I called the patient to discuss her concerns a bit more. She tells me that she wants to wait  for 4 weeks until her lungs are stronger. She does report that she gets a lot of coughing with exercise. She has not been on Spiriva for too long. It was one week yesterday. She does feel that her Spiriva is helping her to no drop her pulse ox.   She does have Hizentra in the fridge and she is ready for an infusion when she decides to go with it in one month. [employer] RN aware as well.   Salvatore Marvel, MD [facility]

## 2020-12-12 ENCOUNTER — Ambulatory Visit: Payer: 59

## 2020-12-12 ENCOUNTER — Other Ambulatory Visit: Payer: Self-pay

## 2020-12-12 VITALS — HR 82

## 2020-12-12 DIAGNOSIS — U099 Post covid-19 condition, unspecified: Secondary | ICD-10-CM | POA: Diagnosis not present

## 2020-12-12 DIAGNOSIS — R5381 Other malaise: Secondary | ICD-10-CM

## 2020-12-12 DIAGNOSIS — Z8616 Personal history of COVID-19: Secondary | ICD-10-CM

## 2020-12-12 NOTE — Therapy (Signed)
[place], [place], [address] Phone: [phone]   Fax:  [phone]  Physical Therapy Treatment  Patient Details  Name: Nicole Mendoza MRN: [id number] Date of Birth: 1990/10/18 Referring Provider (PT): Fenton Foy, NP   Encounter Date: 12/12/2020   PT End of Session - 12/12/20 1345     Visit Number 4    Number of Visits 17    Date for PT Re-Evaluation 02/03/21    Authorization Type [employer] OTHER    Authorization Time Period Complete FOTO on the 5th and 10th visits    Progress Note Due on Visit 10    PT Start Time 1338    PT Stop Time 1417    PT Time Calculation (min) 39 min    Activity Tolerance Patient tolerated treatment well    Behavior During Therapy WFL for tasks assessed/performed             Past Medical History:  Diagnosis Date   Allergic rhinitis    Allergy    Angio-edema    Asthma    Fatty liver    GERD (gastroesophageal reflux disease)    Hypertension    Recurrent upper respiratory infection (URI)    Tachycardia     Past Surgical History:  Procedure Laterality Date   NO PAST SURGERIES      Vitals:   12/12/20 1403  Pulse: 82  SpO2: 98%     Subjective Assessment - 12/12/20 1343     Subjective Pt reports she is seeing improvement in her endurance.    Diagnostic tests Pt reports no intersitial lung disease    Patient Stated Goals I want to improve my endurance, to play with my 29 yo, and to be able to go to the grocery store    Currently in Pain? No/denies                               [facility] Adult PT Treatment/Exercise - 12/12/20 0001       Exercises   Exercises Knee/Hip      Knee/Hip Exercises: Aerobic   Nustep L5; 10 mins   post ex: O2 sat 98%, HR 98 , Somewhat hard exertion reported     Knee/Hip Exercises: Standing   Heel Raises Both;20 reps    Heel Raises Limitations toe lifts as well    Knee Flexion Right;Left;15 reps     Hip Flexion Right;Left;15 reps    Hip Abduction Right;Left;15 reps    Hip Extension Right;Left;15 reps      Knee/Hip Exercises: Seated   Long Arc Quad Right;Left;2 sets;10 reps    Long Arc Quad Weight 3 lbs.    Sit to [employer] 2 sets;10 reps                    PT Education - 12/12/20 1415     Education Details HEP updated for standing exs    Person(s) Educated Patient    Methods Explanation;Demonstration;Tactile cues;Verbal cues;Handout    Comprehension Verbalized understanding;Returned demonstration;Verbal cues required;Tactile cues required              PT Short Term Goals - 11/30/20 1653       PT SHORT TERM GOAL #1   Title Pt will be Ind c a HEP to address endurance and strength.    Baseline Started on eval    Status New    Target Date  12/21/20               PT Long Term Goals - 11/30/20 1654       PT LONG TERM GOAL #1   Title Pt will be Ind in a final HEP to maintain or progress achieved LOF    Status New    Target Date 01/27/21      PT LONG TERM GOAL #2   Title Pt will demonstrate an improved 6 min walking test to 1300 ft. to be able to better participate in ADLs, play with her son, and shopping involving prolonged walking    Baseline 729ft    Status New    Target Date 01/27/21      PT LONG TERM GOAL #3   Title Pt will demonstrate an improved STS to 15 sec as demonstration of improved LE strength and activity tolerance    Baseline 29.31    Status New      PT LONG TERM GOAL #4   Title Increase pt's bilat hip strength to 4+/5 or greater for improved tolerance to functional mobility    Status New    Target Date 01/27/21      PT LONG TERM GOAL #5   Title Improve pt's FOTO score to predicted value of 65% functional ability    Baseline 47%    Status New    Target Date 01/27/21                   Plan - 12/12/20 1346     Clinical Impression Statement PT was provided for LE strengthening and balance exs in standing. Pt reported only  experienced minimal light headedness with the sit to stand ex. Pt tolerated the initiation of standing exs well. O2 sats and HR rate continues to respond appropriately to the cardiopulmonary ex on the NuStep. Pt is encouraged by her improved endurance with exs and her daily activities    Personal Factors and Comorbidities Past/Current Experience;Comorbidity 1;Comorbidity 2;Comorbidity 3+    Comorbidities asthma, recurrent URI, tachycardia, obesity, CPAP use    Examination-Activity Limitations Locomotion Level;Squat;Stand    Examination-Participation Restrictions Occupation;Other    Stability/Clinical Decision Making Unstable/Unpredictable    Clinical Decision Making Moderate    Rehab Potential Good    PT Frequency 2x / week    PT Duration 8 weeks    PT Treatment/Interventions ADLs/Self Care Home Management;Balance training;Therapeutic exercise;Therapeutic activities;Functional mobility training;Stair training;Gait training;Patient/family education    PT Next Visit Plan Progress ther ex to address activity tolerance and strength. Review FOTO    PT Home Exercise Plan [email or web address]; [id number] for standing exs.    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   Recurrent sinusitis 11/09/2020   Pre-nodular edema of the vocal folds 11/09/2020   Laryngospasms 11/09/2020   ASCUS of  cervix with negative high risk HPV 07/10/2020   Palpitations 09/02/2019  Cardiac murmur 09/02/2019   History of COVID-19 09/01/2019   Tachycardia 09/01/2019   Salivary stone 09/01/2019   Anaphylactic shock due to adverse food reaction 01/22/2019   Seasonal and perennial allergic rhinitis 01/22/2019   Morbid obesity with BMI of 40.0-44.9, adult ([place]) 10/28/2017   Moderate persistent asthma without complication 63/78/5885   Essential hypertension 04/22/2017   Dysmenorrhea 06/26/2016   Menorrhagia with regular cycle 06/26/2016   Gastroesophageal reflux disease 12/22/2015   Iron deficiency anemia 11/02/2014   Vitamin D deficiency 11/02/2014    Gar Ponto MS, PT 12/12/20 5:05 PM   [place] [facility] [address] [place], [place], [address] Phone: [phone]   Fax:  [phone]  Name: Nicole Mendoza MRN: [id number] Date of Birth: 06-Aug-1990

## 2020-12-15 ENCOUNTER — Other Ambulatory Visit: Payer: Self-pay

## 2020-12-15 ENCOUNTER — Ambulatory Visit: Payer: 59 | Attending: Nurse Practitioner

## 2020-12-15 VITALS — HR 82

## 2020-12-15 DIAGNOSIS — Z8616 Personal history of COVID-19: Secondary | ICD-10-CM | POA: Insufficient documentation

## 2020-12-15 DIAGNOSIS — R5381 Other malaise: Secondary | ICD-10-CM | POA: Insufficient documentation

## 2020-12-15 DIAGNOSIS — U099 Post covid-19 condition, unspecified: Secondary | ICD-10-CM | POA: Diagnosis present

## 2020-12-15 NOTE — Therapy (Signed)
[place], [place], [address] Phone: [phone]   Fax:  [phone]  Physical Therapy Treatment  Patient Details  Name: Nicole Mendoza MRN: [id number] Date of Birth: 08/17/1990 Referring Provider (PT): Fenton Foy, NP   Encounter Date: 12/15/2020   PT End of Session - 12/15/20 0950     Visit Number 5    Number of Visits 17    Date for PT Re-Evaluation 02/03/21    Authorization Type [employer] OTHER    Authorization Time Period Complete FOTO on the 5th and 10th visits    Progress Note Due on Visit 10    PT Start Time 0934    PT Stop Time 1014    PT Time Calculation (min) 40 min    Activity Tolerance Patient tolerated treatment well    Behavior During Therapy WFL for tasks assessed/performed             Past Medical History:  Diagnosis Date   Allergic rhinitis    Allergy    Angio-edema    Asthma    Fatty liver    GERD (gastroesophageal reflux disease)    Hypertension    Recurrent upper respiratory infection (URI)    Tachycardia     Past Surgical History:  Procedure Laterality Date   NO PAST SURGERIES      Vitals:   12/15/20 1010  Pulse: 82  SpO2: 99%     Subjective Assessment - 12/15/20 0943     Subjective Pt reports experiencing POTS symptoms with sit t/f stand exs and would like to she if she can tolerate lower level partial squats    Diagnostic tests Pt reports no intersitial lung disease    Patient Stated Goals I want to improve my endurance, to play with my 30 yo, and to be able to go to the grocery store    Currently in Pain? No/denies                               [facility] Adult PT Treatment/Exercise - 12/15/20 0001       Exercises   Exercises Knee/Hip      Knee/Hip Exercises: Aerobic   Nustep L5; 10 mins   post ex: O2 sat 99%, HR 99 , Fairly light exertion reported     Knee/Hip Exercises: Standing   Heel Raises Both;20 reps    Heel  Raises Limitations toe lifts as well    Knee Flexion Right;Left;15 reps    Hip Flexion Right;Left;15 reps    Hip Abduction Right;Left;15 reps    Hip Extension Right;Left;15 reps    Functional Squat 2 sets;10 reps   partial   Functional Squat Limitations min light headedness; pt reports tolerating well      Knee/Hip Exercises: Seated   Long Arc Quad Right;Left;2 sets;10 reps    Long Arc Quad Weight 3 lbs.                      PT Short Term Goals - 11/30/20 1653       PT SHORT TERM GOAL #1   Title Pt will be Ind c a HEP to address endurance and strength.    Baseline Started on eval    Status New    Target Date 12/21/20               PT Long Term Goals - 12/15/20 1312  PT LONG TERM GOAL #5   Title Improve pt's FOTO score to predicted value of 65% functional ability. 12/15/20: 56%    Baseline 47%    Status On-going    Target Date 01/27/21                   Plan - 12/15/20 0954     Clinical Impression Statement Pt experienced POTS symptoms with sit t/f standing at home, so partial squats were subsituted and pt tolerated well. Pt's FOTO was re-assessed and it indicated the pt is experiencing good functional gains. PT was continued to improve activity tolerance and strength. Pt tolerated today's session without adverse effects.    Personal Factors and Comorbidities Past/Current Experience;Comorbidity 1;Comorbidity 2;Comorbidity 3+    Comorbidities asthma, recurrent URI, tachycardia, obesity, CPAP use    Examination-Activity Limitations Locomotion Level;Squat;Stand    Examination-Participation Restrictions Occupation;Other   ADLs   Stability/Clinical Decision Making Stable/Uncomplicated    Clinical Decision Making Moderate    Rehab Potential Good    PT Frequency 2x / week    PT Duration 8 weeks    PT Treatment/Interventions ADLs/Self Care Home Management;Balance training;Therapeutic exercise;Therapeutic activities;Functional mobility training;Stair  training;Gait training;Patient/family education    PT Next Visit Plan Progress ther ex to address activity tolerance and strength.    PT Home Exercise Plan NTRBTLME; [id number] for standing exs.    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   Recurrent sinusitis 11/09/2020   Pre-nodular edema of the vocal folds 11/09/2020   Laryngospasms 11/09/2020   ASCUS of cervix with negative high risk HPV 07/10/2020   Palpitations 09/02/2019   Cardiac murmur 09/02/2019   History of COVID-19 09/01/2019   Tachycardia 09/01/2019   Salivary stone 09/01/2019   Anaphylactic shock due to adverse food reaction 01/22/2019   Seasonal and perennial allergic rhinitis 01/22/2019   Morbid obesity with BMI of 40.0-44.9, adult ([place]) 10/28/2017   Moderate persistent asthma without complication 52/84/1324   Essential hypertension 04/22/2017   Dysmenorrhea 06/26/2016   Menorrhagia with regular cycle 06/26/2016   Gastroesophageal reflux disease 12/22/2015   Iron deficiency anemia 11/02/2014   Vitamin D deficiency 11/02/2014   Gar Ponto MS, PT 12/15/20 1:20 PM    [facility] [address] [place], [place], [address] Phone: [phone]   Fax:  [phone]  Name: Nicole Mendoza MRN: [id number] Date of Birth: 1991-03-13

## 2020-12-19 ENCOUNTER — Ambulatory Visit: Payer: 59

## 2020-12-20 ENCOUNTER — Other Ambulatory Visit: Payer: Self-pay

## 2020-12-20 ENCOUNTER — Encounter: Payer: Self-pay | Admitting: Family Medicine

## 2020-12-20 ENCOUNTER — Ambulatory Visit (INDEPENDENT_AMBULATORY_CARE_PROVIDER_SITE_OTHER): Payer: 59 | Admitting: Family Medicine

## 2020-12-20 VITALS — BP 128/84 | HR 90 | Temp 98.5°F | Resp 16

## 2020-12-20 DIAGNOSIS — D808 Other immunodeficiencies with predominantly antibody defects: Secondary | ICD-10-CM

## 2020-12-20 DIAGNOSIS — T7800XD Anaphylactic reaction due to unspecified food, subsequent encounter: Secondary | ICD-10-CM

## 2020-12-20 DIAGNOSIS — J4540 Moderate persistent asthma, uncomplicated: Secondary | ICD-10-CM | POA: Diagnosis not present

## 2020-12-20 DIAGNOSIS — K219 Gastro-esophageal reflux disease without esophagitis: Secondary | ICD-10-CM

## 2020-12-20 DIAGNOSIS — B999 Unspecified infectious disease: Secondary | ICD-10-CM | POA: Diagnosis not present

## 2020-12-20 DIAGNOSIS — J3089 Other allergic rhinitis: Secondary | ICD-10-CM | POA: Diagnosis not present

## 2020-12-20 DIAGNOSIS — U099 Post covid-19 condition, unspecified: Secondary | ICD-10-CM

## 2020-12-20 DIAGNOSIS — Z79899 Other long term (current) drug therapy: Secondary | ICD-10-CM | POA: Insufficient documentation

## 2020-12-20 DIAGNOSIS — J302 Other seasonal allergic rhinitis: Secondary | ICD-10-CM

## 2020-12-20 MED ORDER — DEXLANSOPRAZOLE 60 MG PO CPDR: 60.0000 mg | DELAYED_RELEASE_CAPSULE | Freq: Every day | ORAL | 5 refills | Status: DC

## 2020-12-20 NOTE — Progress Notes (Signed)
[place] [address] Dept: [phone]  FOLLOW UP NOTE  Patient ID: Nicole Mendoza, female    DOB: 1991/01/20  Age: 30 y.o. MRN: [id number] Date of Office Visit: 12/20/2020  Assessment  Chief Complaint: Gastroesophageal Reflux  HPI Nicole Mendoza is a 30 year old female who presents to the clinic for an evaluation of chest tightness and reflux that began on Friday evening. She was last seen in this clinic on 11/16/2020 by Dr. Ernst Bowler for evaluation of asthma, vocal cord dysfunction, allergic rhinitis, reflux, recurrent infections, and food allergy to peanuts and tree nuts. Her medical history also includes obstructive sleep apnea with CPAP, possible POTS, use of a beta blocker, and long haul COVID symptoms for which she participates in physical therapy at the pulmonary rehab center. At today's visit, she reports that on Friday she began to experience chest tightness, shortness of breath, cough producing thin mucous, and an increase in burping. She reports these symptoms worsened on 09/09/22 and were more pronounced while she was lying down. She reports reflux has not been well controlled recently with symptoms including heartburn, nausea, and burping. She has changed her diet recently in an effort to lose weight. Overall symptoms began to improve on Sunday and even more on Monday. For asthma she continues montelukast 10 mg once a day, Asmanex 220-2 puffs twice a day, Spiriva 1.25 mcg-2 puffs once a day and Xopenex as needed. She has not used budesonide via nebulizer since her last visit to this clinic. For reflux she currently takes pantoprazole 40 mg twice a day, famotidine 20 mg twice a day and Carafate as needed. She reports that she has tried Dexilant 60 mg once a day with relief of symptoms in the past. She has recently been evaluated for vocal cord dysfunction and will begin speech therapy next week. She continues physical therapy at the outpatient rehabilitation center for  long haul COVID symptoms. She continues azithromycin three days a week and will begin Hizentra infusions in a few weeks when she feels stronger. Allergic rhinitis is reported as moderately well controlled with occasional nasal congestion. She continues saline nasal rinses as needed and cetirizine as needed. She continues to avoid peanuts and tree nuts with no accidental ingestion or use of EpiPen since her last visit to this clinic. Her current medications are listed in the chart.    Drug Allergies:  Allergies  Allergen Reactions   Budesonide-Formoterol Fumarate Other (See Comments)    Causes Hypertension   Penicillins Hives   Raspberry Hives   Albuterol Palpitations   Losartan Rash    Physical Exam: BP 128/84   Pulse 90   Temp 98.5 F (36.9 C) (Temporal)   Resp 16   SpO2 99%    Physical Exam Vitals reviewed.  Constitutional:      Appearance: Normal appearance.  HENT:     Head: Normocephalic and atraumatic.     Right Ear: Tympanic membrane normal.     Left Ear: Tympanic membrane normal.     Nose:     Comments: Bilateral nares normal. Pharynx normal. Ears normal. Eyes normal.    Mouth/Throat:     Pharynx: Oropharynx is clear.  Eyes:     Conjunctiva/sclera: Conjunctivae normal.  Cardiovascular:     Rate and Rhythm: Normal rate and regular rhythm.     Heart sounds: Normal heart sounds. No murmur heard. Pulmonary:     Effort: Pulmonary effort is normal.     Breath sounds: Normal breath sounds.  Comments: Lungs clear to auscultation Musculoskeletal:        General: Normal range of motion.     Cervical back: Normal range of motion and neck supple.  Skin:    General: Skin is warm and dry.  Neurological:     Mental Status: She is alert and oriented to person, place, and time.  Psychiatric:        Mood and Affect: Mood normal.        Behavior: Behavior normal.        Thought Content: Thought content normal.        Judgment: Judgment normal.    Diagnostics: FVC  4.33, FEV1 3.84.  Predicted FVC 4.30, predicted FEV1 3.61.  Spirometry indicates normal ventilatory function.  Assessment and Plan: 1. Moderate persistent asthma without complication   2. Seasonal and perennial allergic rhinitis   3. Gastroesophageal reflux disease, unspecified whether esophagitis present   4. Anaphylactic shock due to food, subsequent encounter   5. Recurrent infections   6. 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 COVID-19 long hauler   8. Current use of beta blocker     Meds ordered this encounter  Medications   dexlansoprazole (DEXILANT) 60 MG capsule    Sig: Take 1 capsule (60 mg total) by mouth daily.    Dispense:  30 capsule    Refill:  5     Patient Instructions  Asthma Continue montelukast 10 mg once a day to prevent cough or wheeze Continue Asthmanex 220- 2 puffs twice a day to prevent cough or wheeze Continue Spiriva 1.25 mcg-2 puffs twice a day to prevent cough or wheeze Continue levalbuterol 2 puffs every 4 hours as needed for cough or wheeze OR Instead use levalbuterol solution via nebulizer one unit vial every 4-6 hours as needed for cough or wheeze For asthma flares, continue Pulmicort 0.5 mg twice a day via nebulizer for 2 weeks or until cough and wheeze free.  Allergic rhinitis Continue allergen avoidance measures directed toward pollens and Mendoza mites as listed below Continue saline rinses several times a day Continue cetirizine 10 mg once a day as needed for a runny nose or itch Continue Xhance 2 sprays in each nostril twice a day for nasal symptoms  Recurrent infections Continue azithromycin three days a week Continue to keep track of infections We will move forward with immunoglobulin replacement when you are ready to begin this process  Reflux Continue dietary and lifestyle modifications as listed below Begin Dexilant 60 mg once a day to prevent reflux. This will replace  pantoprazole for now Continue famotidine 20 mg twice a day to prevent reflux  Vocal cord dysfunction Continue with speech therapy as you have been  Food allergy Continue to avoid peanuts and tree nuts.  In case of an allergic reaction, take Benadryl 50 mg every 4 hours, and if life-threatening symptoms occur, inject with AuviQ 0.3 mg.  Call the clinic if this treatment plan is not working well for you  Follow up in 2 months or sooner if needed.  Return in about 2 months (around 02/20/2021), or if symptoms worsen or fail to improve.    Thank you for the opportunity to care for this patient.  Please do not hesitate to contact me with questions.  Gareth Morgan, FNP [facility]

## 2020-12-20 NOTE — Patient Instructions (Addendum)
Asthma Continue montelukast 10 mg once a day to prevent cough or wheeze Continue Asthmanex 220- 2 puffs twice a day to prevent cough or wheeze Continue Spiriva 1.25 mcg-2 puffs twice a day to prevent cough or wheeze Continue levalbuterol 2 puffs every 4 hours as needed for cough or wheeze OR Instead use levalbuterol solution via nebulizer one unit vial every 4-6 hours as needed for cough or wheeze For asthma flares, continue Pulmicort 0.5 mg twice a day via nebulizer for 2 weeks or until cough and wheeze free.  Allergic rhinitis Continue allergen avoidance measures directed toward pollens and dust mites as listed below Continue saline rinses several times a day Continue cetirizine 10 mg once a day as needed for a runny nose or itch Continue Xhance 2 sprays in each nostril twice a day for nasal symptoms  Recurrent infections Continue azithromycin three days a week Continue to keep track of infections We will move forward with immunoglobulin replacement when you are ready to begin this process  Reflux Continue dietary and lifestyle modifications as listed below Begin Dexilant 60 mg once a day to prevent reflux. This will replace pantoprazole for now Continue famotidine 20 mg twice a day to prevent reflux  Vocal cord dysfunction Continue with speech therapy as you have been  Food allergy Continue to avoid peanuts and tree nuts.  In case of an allergic reaction, take Benadryl 50 mg every 4 hours, and if life-threatening symptoms occur, inject with AuviQ 0.3 mg.  Call the clinic if this treatment plan is not working well for you  Follow up in 2 months or sooner if needed.

## 2020-12-21 ENCOUNTER — Telehealth: Payer: Self-pay

## 2020-12-21 NOTE — Telephone Encounter (Signed)
[place] submitted thru cover my meds for dexilant pt has tried and failed protonix, carafate, pepcid, and omeprazole

## 2020-12-22 NOTE — Telephone Encounter (Signed)
[place] APPROVED PHARMACY NOTIFIED

## 2020-12-25 ENCOUNTER — Ambulatory Visit: Payer: Self-pay

## 2020-12-27 ENCOUNTER — Other Ambulatory Visit: Payer: Self-pay

## 2020-12-27 ENCOUNTER — Ambulatory Visit: Payer: 59

## 2020-12-27 VITALS — HR 80

## 2020-12-27 DIAGNOSIS — Z8616 Personal history of COVID-19: Secondary | ICD-10-CM

## 2020-12-27 DIAGNOSIS — R5381 Other malaise: Secondary | ICD-10-CM

## 2020-12-27 DIAGNOSIS — U099 Post covid-19 condition, unspecified: Secondary | ICD-10-CM

## 2020-12-27 NOTE — Therapy (Signed)
[place], [place], [address] Phone: [phone]   Fax:  [phone]  Physical Therapy Treatment  Patient Details  Name: Nicole Mendoza MRN: [id number] Date of Birth: 1990-09-23 Referring Provider (PT): Fenton Foy, NP   Encounter Date: 12/27/2020   PT End of Session - 12/27/20 0945     Visit Number 6    Number of Visits 17    Date for PT Re-Evaluation 02/03/21    Authorization Type [employer] OTHER    Authorization Time Period Complete FOTO on the 5th and 10th visits    Progress Note Due on Visit 10    PT Start Time 0937    PT Stop Time 1017    PT Time Calculation (min) 40 min    Activity Tolerance Patient tolerated treatment well    Behavior During Therapy WFL for tasks assessed/performed             Past Medical History:  Diagnosis Date   Allergic rhinitis    Allergy    Angio-edema    Asthma    Fatty liver    GERD (gastroesophageal reflux disease)    Hypertension    Recurrent upper respiratory infection (URI)    Tachycardia     Past Surgical History:  Procedure Laterality Date   NO PAST SURGERIES      Vitals:   12/27/20 0957  Pulse: 80  SpO2: 99%     Subjective Assessment - 12/27/20 0941     Subjective Overall, pt reports she is pleased with her progress. She does report not drinking enough fluids yesterday which gave her more issues with her POTS symptoms    Diagnostic tests Pt reports no intersitial lung disease    Patient Stated Goals I want to improve my endurance, to play with my 30 yo, and to be able to go to the grocery store    Currently in Pain? No/denies                               [facility] Adult PT Treatment/Exercise - 12/27/20 0001       Exercises   Exercises Knee/Hip;Lumbar      Lumbar Exercises: Supine   Pelvic Tilt 10 reps    Bent Knee Raise --   2 sets     Knee/Hip Exercises: Aerobic   Nustep L5; 10 mins   post ex: O2  sat 99%, HR 99 , Fairly light exertion reported     Knee/Hip Exercises: Seated   Long Arc Quad Right;Left;2 sets;10 reps    Long Arc Quad Weight 3 lbs.      Knee/Hip Exercises: Supine   Hip Adduction Isometric Both;15 reps    Hip Adduction Isometric Limitations ball squeeze    Bridges Both;15 reps    Straight Leg Raises Right;Left   12x   Other Supine Knee/Hip Exercises Clams 20x, green TB      Knee/Hip Exercises: Sidelying   Hip ABduction Right;Left;15 reps    Hip ABduction Limitations 3 lbs    Other Sidelying Knee/Hip Exercises hip abd c forward/backward taps                      PT Short Term Goals - 12/27/20 1247       PT SHORT TERM GOAL #1   Title Pt will be Ind c a HEP to address endurance and strength.    Baseline  Started on eval    Status Achieved    Target Date 12/27/20               PT Long Term Goals - 12/15/20 1312       PT LONG TERM GOAL #5   Title Improve pt's FOTO score to predicted value of 65% functional ability. 12/15/20: 56%    Baseline 47%    Status On-going    Target Date 01/27/21                   Plan - 12/27/20 0946     Clinical Impression Statement Pt requested her PT today to be completed in sitting or supine with her experiencing POTs symptoms which she believes was related to decreased fluid intake. PT was continued to address LE strength with the addition of core strengthening exs today. Pt's reponse to cardiopulmonary exs c the NuStep continues to be appropriate s a drop in O2 sats and an appropriate increase in HR.    Personal Factors and Comorbidities Past/Current Experience;Comorbidity 1;Comorbidity 2;Comorbidity 3+    Comorbidities asthma, recurrent URI, tachycardia, obesity, CPAP use    Examination-Activity Limitations Locomotion Level;Squat;Stand    Examination-Participation Restrictions Occupation;Other;Meal Prep    Stability/Clinical Decision Making Stable/Uncomplicated    Clinical Decision Making Moderate     Rehab Potential Good    PT Frequency 2x / week    PT Duration 8 weeks    PT Treatment/Interventions ADLs/Self Care Home Management;Balance training;Therapeutic exercise;Therapeutic activities;Functional mobility training;Stair training;Gait training;Patient/family education    PT Next Visit Plan Progress ther ex to address activity tolerance and strength.    PT Home Exercise Plan NTRBTLME; [id number] for standing exs.    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 11/22/2020   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 ([place]) 11/19/2020   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   Recurrent sinusitis 11/09/2020   Pre-nodular edema of the vocal folds 11/09/2020   Laryngospasms 11/09/2020   ASCUS of cervix with negative high risk HPV 07/10/2020   Palpitations 09/02/2019   Cardiac murmur 09/02/2019   History of COVID-19 09/01/2019   Tachycardia 09/01/2019   Salivary stone 09/01/2019   Anaphylactic shock due to adverse food reaction 01/22/2019   Seasonal and perennial allergic rhinitis 01/22/2019   Morbid obesity with BMI of 40.0-44.9, adult ([place]) 10/28/2017   Moderate persistent asthma without complication 12/45/8099   Essential hypertension 04/22/2017   Dysmenorrhea 06/26/2016   Menorrhagia with regular cycle 06/26/2016   Gastroesophageal reflux disease 12/22/2015    Iron deficiency anemia 11/02/2014   Vitamin D deficiency 11/02/2014    Gar Ponto MS, PT 12/27/20 12:55 PM   [facility] [address] [place], [place], [address] Phone: [phone]   Fax:  [phone]  Name: Nicole Mendoza MRN: [id number] Date of Birth: 1991-03-15

## 2020-12-29 ENCOUNTER — Ambulatory Visit: Payer: 59

## 2020-12-29 ENCOUNTER — Other Ambulatory Visit: Payer: Self-pay

## 2020-12-29 DIAGNOSIS — R5381 Other malaise: Secondary | ICD-10-CM

## 2020-12-29 DIAGNOSIS — U099 Post covid-19 condition, unspecified: Secondary | ICD-10-CM

## 2020-12-29 DIAGNOSIS — Z8616 Personal history of COVID-19: Secondary | ICD-10-CM

## 2020-12-29 NOTE — Therapy (Signed)
[place], [place], [address] Phone: [phone]   Fax:  [phone]  Physical Therapy Treatment  Patient Details  Name: Nicole Mendoza MRN: [id number] Date of Birth: 02/05/1991 Referring Provider (PT): Fenton Foy, NP   Encounter Date: 12/29/2020   PT End of Session - 12/29/20 0945     Visit Number 7    Number of Visits 17    Date for PT Re-Evaluation 02/03/21    Authorization Type [employer] OTHER    Authorization Time Period Complete FOTO 10th visit    Progress Note Due on Visit 10    PT Start Time 0935    PT Stop Time 1015    PT Time Calculation (min) 40 min    Activity Tolerance Patient tolerated treatment well    Behavior During Therapy WFL for tasks assessed/performed             Past Medical History:  Diagnosis Date   Allergic rhinitis    Allergy    Angio-edema    Asthma    Fatty liver    GERD (gastroesophageal reflux disease)    Hypertension    Recurrent upper respiratory infection (URI)    Tachycardia     Past Surgical History:  Procedure Laterality Date   NO PAST SURGERIES      There were no vitals filed for this visit.   Subjective Assessment - 12/29/20 0943     Subjective Pt reports her POTS symptoms have been better controlled.    Diagnostic tests Pt reports no intersitial lung disease    Patient Stated Goals I want to improve my endurance, to play with my 30 yo, and to be able to go to the grocery store    Currently in Pain? No/denies                [facility] PT Assessment - 12/29/20 0001       Ambulation/Gait   Gait Comments 6MWT=1065ft                           [facility] Adult PT Treatment/Exercise - 12/29/20 0001       Exercises   Exercises Knee/Hip;Lumbar;Shoulder      Knee/Hip Exercises: Aerobic   Nustep L5; 10 mins   post ex: O2 sat 99%, HR 106 , Fairly light exertion reported     Shoulder Exercises: Standing   Extension  Both;15 reps    Theraband Level (Shoulder Extension) Level 3 (Green)    Row Both;15 reps    Theraband Level (Shoulder Row) Level 3 (Green)    Other Standing Exercises Pallof press 15x each direction    Other Standing Exercises Shoulder press; chest press, elbow curl 15x, green Tband                    PT Education - 12/29/20 2211     Education Details Reviewed and pt completed upper body strengthening.    Person(s) Educated Patient    Methods Explanation;Demonstration;Tactile cues;Verbal cues    Comprehension Verbalized understanding;Returned demonstration              PT Short Term Goals - 12/27/20 1247       PT SHORT TERM GOAL #1   Title Pt will be Ind c a HEP to address endurance and strength.    Baseline Started on eval    Status Achieved    Target Date 12/27/20  PT Long Term Goals - 12/29/20 1017       PT LONG TERM GOAL #2   Title Pt will demonstrate an improved 6 min walking test to 1300 ft. to be able to better participate in ADLs, play with her son, and shopping involving prolonged walking. 12/29/20:1070 ft    Baseline 718ft    Status On-going    Target Date 01/27/21                   Plan - 12/29/20 2214     Clinical Impression Statement PT was completed for upper body strengthening and to address activitiy tolerance. The 6MWT was re-assessed and pt increased her walking distance 239ft, indicating good progress in her cardiopulmonary/activity tolerance.    Personal Factors and Comorbidities Past/Current Experience;Comorbidity 1;Comorbidity 2;Comorbidity 3+    Comorbidities asthma, recurrent URI, tachycardia, obesity, CPAP use    Examination-Activity Limitations Locomotion Level;Squat;Stand    Examination-Participation Restrictions Occupation;Other;Meal Prep    Stability/Clinical Decision Making Stable/Uncomplicated    Clinical Decision Making Moderate    Rehab Potential Good    PT Frequency 2x / week    PT  Treatment/Interventions ADLs/Self Care Home Management;Balance training;Therapeutic exercise;Therapeutic activities;Functional mobility training;Stair training;Gait training;Patient/family education    PT Next Visit Plan Progress ther ex to address activity tolerance and strength.    PT Home Exercise Plan NTRBTLME; [id number] for standing exs.    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 11/22/2020   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 ([place]) 11/19/2020   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   Recurrent sinusitis 11/09/2020   Pre-nodular edema of the vocal folds 11/09/2020   Laryngospasms 11/09/2020   ASCUS of cervix with negative high risk HPV 07/10/2020   Palpitations 09/02/2019   Cardiac murmur 09/02/2019   History of COVID-19 09/01/2019   Tachycardia 09/01/2019   Salivary stone 09/01/2019   Anaphylactic shock due to adverse food reaction 01/22/2019   Seasonal and perennial allergic rhinitis 01/22/2019   Morbid obesity with BMI of 40.0-44.9, adult ([place]) 10/28/2017   Moderate persistent asthma without complication 53/29/9242   Essential hypertension 04/22/2017   Dysmenorrhea 06/26/2016   Menorrhagia with regular cycle 06/26/2016   Gastroesophageal reflux disease 12/22/2015   Iron deficiency anemia 11/02/2014   Vitamin D deficiency 11/02/2014   Gar Ponto MS, PT 12/29/20 10:29 PM   [facility] [address] [place], [place], [address] Phone: [phone]   Fax:  [phone]  Name: Nicole Mendoza MRN: [id number] Date of Birth: 04-Oct-1990

## 2021-01-02 ENCOUNTER — Ambulatory Visit: Payer: 59

## 2021-01-05 ENCOUNTER — Ambulatory Visit: Payer: 59

## 2021-01-05 ENCOUNTER — Other Ambulatory Visit: Payer: Self-pay

## 2021-01-05 VITALS — HR 83

## 2021-01-05 DIAGNOSIS — R5381 Other malaise: Secondary | ICD-10-CM

## 2021-01-05 DIAGNOSIS — Z8616 Personal history of COVID-19: Secondary | ICD-10-CM

## 2021-01-05 DIAGNOSIS — U099 Post covid-19 condition, unspecified: Secondary | ICD-10-CM | POA: Diagnosis not present

## 2021-01-05 NOTE — Therapy (Signed)
[place], [place], [address] Phone: [phone]   Fax:  [phone]  Physical Therapy Treatment  Patient Details  Name: Nicole Mendoza MRN: [id number] Date of Birth: 06/16/1991 Referring Provider (PT): Fenton Foy, NP   Encounter Date: 01/05/2021   PT End of Session - 01/05/21 0951     Visit Number 8    Number of Visits 17    Date for PT Re-Evaluation 02/03/21    Authorization Type [employer] OTHER    Authorization Time Period Complete FOTO 10th visit    Progress Note Due on Visit 10    PT Start Time 0949    PT Stop Time 1019    PT Time Calculation (min) 30 min    Activity Tolerance Patient tolerated treatment well    Behavior During Therapy WFL for tasks assessed/performed             Past Medical History:  Diagnosis Date   Allergic rhinitis    Allergy    Angio-edema    Asthma    Fatty liver    GERD (gastroesophageal reflux disease)    Hypertension    Recurrent upper respiratory infection (URI)    Tachycardia     Past Surgical History:  Procedure Laterality Date   NO PAST SURGERIES      Vitals:   01/05/21 1356  Pulse: 83  SpO2: 99%     Subjective Assessment - 01/05/21 0955     Subjective Pt states she needs to drink more water to better manage her POTS. Pt reports her balance seems to be off.    Patient Stated Goals I want to improve my endurance, to play with my 30 yo, and to be able to go to the grocery store    Currently in Pain? No/denies                               [facility] Adult PT Treatment/Exercise - 01/05/21 0001       Exercises   Exercises Knee/Hip;Lumbar;Shoulder      Knee/Hip Exercises: Aerobic   Nustep L5; 10 mins   post ex: O2 sat 99%, HR 104 , Fairly light exertion reported     Knee/Hip Exercises: Standing   Heel Raises 15 reps    Heel Raises Limitations toe lifts    Hip Abduction Right;Left;15 reps;Knee straight     Abduction Limitations finger A as needed    Functional Squat 2 sets;10 reps    Functional Squat Limitations Partial    SLS with Vectors L and R, 1 min 2x   finger touches for A as needed                Balance Exercises - 01/05/21 0001       Balance Exercises: Standing   Tandem Stance Eyes open;Foam/compliant surface;2 reps;Time   1 min                PT Short Term Goals - 12/27/20 1247       PT SHORT TERM GOAL #1   Title Pt will be Ind c a HEP to address endurance and strength.    Baseline Started on eval    Status Achieved    Target Date 12/27/20               PT Long Term Goals - 12/29/20 1017       PT LONG TERM  GOAL #2   Title Pt will demonstrate an improved 6 min walking test to 1300 ft. to be able to better participate in ADLs, play with her son, and shopping involving prolonged walking. 12/29/20:1070 ft    Baseline 77f    Status On-going    Target Date 01/27/21                   Plan - 01/05/21 0954     Clinical Impression Statement PT was completed today for activity tolerance with completioon of the NuStep. Pt then completed ther ex to address LE strengthening and balance in the [facility]. Pt tolerated today's session without adverse effects.    Personal Factors and Comorbidities Past/Current Experience;Comorbidity 1;Comorbidity 2;Comorbidity 3+    Comorbidities asthma, recurrent URI, tachycardia, obesity, CPAP use    Examination-Activity Limitations Locomotion Level;Squat;Stand    Examination-Participation Restrictions Occupation;Other;Meal Prep    Stability/Clinical Decision Making Stable/Uncomplicated    Clinical Decision Making Moderate    Rehab Potential Good    PT Frequency 2x / week    PT Duration 8 weeks    PT Treatment/Interventions ADLs/Self Care Home Management;Balance training;Therapeutic exercise;Therapeutic activities;Functional mobility training;Stair training;Gait training;Patient/family education    PT Next Visit Plan  Progress ther ex to address activity tolerance and strength.    PT Home Exercise Plan NTRBTLME; [id number] for standing exs.    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 11/22/2020   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 [place] 11/19/2020   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   Recurrent sinusitis 11/09/2020   Pre-nodular edema of the vocal folds 11/09/2020   Laryngospasms 11/09/2020   ASCUS of cervix with negative high risk HPV 07/10/2020   Palpitations 09/02/2019   Cardiac murmur 09/02/2019   History of COVID-19 09/01/2019   Tachycardia 09/01/2019   Salivary stone 09/01/2019   Anaphylactic shock due to adverse food reaction 01/22/2019   Seasonal and perennial allergic rhinitis 01/22/2019   Morbid obesity with BMI of 40.0-44.9, adult ([place] 10/28/2017   Moderate persistent asthma without complication 00000000  Essential hypertension 04/22/2017   Dysmenorrhea 06/26/2016   Menorrhagia with regular cycle 06/26/2016   Gastroesophageal reflux disease 12/22/2015   Iron deficiency anemia 11/02/2014   Vitamin D deficiency 11/02/2014    AGar PontoMS, PT 01/05/21 2:09 PM   [facility] [address]Phone: [phone]  Fax:  [phone] Name: Nicole MontMRN:  [id number] of Birth: [birth year]

## 2021-01-08 ENCOUNTER — Telehealth: Payer: Self-pay | Admitting: Family Medicine

## 2021-01-08 DIAGNOSIS — R002 Palpitations: Secondary | ICD-10-CM

## 2021-01-08 MED ORDER — METOPROLOL SUCCINATE ER 100 MG PO TB24: 100.0000 mg | ORAL_TABLET | Freq: Every day | ORAL | 3 refills | Status: DC

## 2021-01-08 NOTE — Telephone Encounter (Signed)
Pt called and said she needs a refill on metoprolol succinate (TOPROL-XL) 100 MG 24 hr tablet but her pharmacy needs a new script sent from Dr Gena Fray since he hasn't filled it. She has enough until Wednesday but will need to pick it up on Wednesday.  [facility] [address] [place], [place] [address]

## 2021-01-08 NOTE — Telephone Encounter (Signed)
LFT detailed VM that RX was sent to the pharmacy.  Dm/cma

## 2021-01-10 ENCOUNTER — Other Ambulatory Visit: Payer: Self-pay

## 2021-01-10 ENCOUNTER — Ambulatory Visit: Payer: 59

## 2021-01-10 VITALS — HR 80

## 2021-01-10 DIAGNOSIS — U099 Post covid-19 condition, unspecified: Secondary | ICD-10-CM

## 2021-01-10 DIAGNOSIS — Z8616 Personal history of COVID-19: Secondary | ICD-10-CM

## 2021-01-10 DIAGNOSIS — R5381 Other malaise: Secondary | ICD-10-CM

## 2021-01-10 NOTE — Therapy (Signed)
[place], [place], [address] Phone: [phone]   Fax:  [phone]  Physical Therapy Treatment  Patient Details  Name: Nicole Mendoza MRN: [id number] Date of Birth: [birth year] Referring Provider (PT): Nicole Foy, NP   Encounter Date: 01/10/2021   PT End of Session - 01/10/21 1006     Visit Number 9    Number of Visits 17    Date for PT Re-Evaluation 02/03/21    Authorization Type [employer] OTHER    Authorization Time Period Complete FOTO 10th visit    Progress Note Due on Visit 10    PT Start Time 0940    PT Stop Time 1020    PT Time Calculation (min) 40 min    Activity Tolerance Patient tolerated treatment well    Behavior During Therapy WFL for tasks assessed/performed             Past Medical History:  Diagnosis Date   Allergic rhinitis    Allergy    Angio-edema    Asthma    Fatty liver    GERD (gastroesophageal reflux disease)    Hypertension    Recurrent upper respiratory infection (URI)    Tachycardia     Past Surgical History:  Procedure Laterality Date   NO PAST SURGERIES      Vitals:   01/10/21 1019  Pulse: 80  SpO2: 99%                        [facility] Adult PT Treatment/Exercise - 01/10/21 0001       Exercises   Exercises Knee/Hip;Lumbar;Shoulder      Knee/Hip Exercises: Aerobic   Nustep L5; 10 mins   post ex: O2 sat 99%, HR 103 , Fairly light exertion reported     Knee/Hip Exercises: Standing   Other Standing Knee Exercises SLS c same side forward reach to counter top 10x2 each LE      Shoulder Exercises: Standing   Extension Both;20 reps   2 sets   Theraband Level (Shoulder Extension) Level 3 (Green)    Row Both;20 reps   2 sets   Theraband Level (Shoulder Row) Level 3 (Green)    Other Standing Exercises Shoulder press; chest press, elbow curl 20x2, green Tband                 Balance Exercises - 01/10/21 0001        Balance Exercises: Standing   Other Standing Exercises narrow base stance, forward and side stance, ball toss    Other Standing Exercises Comments SLS c tramp ball toss                 PT Short Term Goals - 12/27/20 1247       PT SHORT TERM GOAL #1   Title Pt will be Ind c a HEP to address endurance and strength.    Baseline Started on eval    Status Achieved    Target Date 12/27/20               PT Long Term Goals - 12/29/20 1017       PT LONG TERM GOAL #2   Title Pt will demonstrate an improved 6 min walking test to 1300 ft. to be able to better participate in ADLs, play with her son, and shopping involving prolonged walking. 12/29/20:1070 ft    Baseline 7110f    Status On-going  Target Date 01/27/21                   Plan - 01/10/21 1011     Clinical Impression Statement PT was continued for cardiopulmonary activity tolerance, LE strengthening and balance, and upper body strengthening. Activity tolerance continues to be improved while pt reports fatigue of LEs with balance exs and therband exs for her upper body. Overall pt is demonstating and reporting improve c endurance and strength. With this improvement, pt's PT will be decreased to1x per week with pt supplementing with her HEP.    Personal Factors and Comorbidities Past/Current Experience;Comorbidity 1;Comorbidity 2;Comorbidity 3+    Comorbidities asthma, recurrent URI, tachycardia, obesity, CPAP use    Examination-Activity Limitations Locomotion Level;Squat;Stand    Examination-Participation Restrictions Occupation;Other;Meal Prep    Stability/Clinical Decision Making Stable/Uncomplicated    Clinical Decision Making Moderate    Rehab Potential Good    PT Frequency 2x / week    PT Duration 8 weeks    PT Treatment/Interventions ADLs/Self Care Home Management;Balance training;Therapeutic exercise;Therapeutic activities;Functional mobility training;Stair training;Gait training;Patient/family  education    PT Next Visit Plan Progress ther ex to address activity tolerance and strength. Assess 5XSTS LTG.    PT Home Exercise Plan NTRBTLME; [id number] for standing exs.    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 11/22/2020   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 ([place]) 11/19/2020   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   Recurrent sinusitis 11/09/2020   Pre-nodular edema of the vocal folds 11/09/2020   Laryngospasms 11/09/2020   ASCUS of cervix with negative high risk HPV 07/10/2020   Palpitations 09/02/2019   Cardiac murmur 09/02/2019   History of COVID-19 09/01/2019   Tachycardia 09/01/2019   Salivary stone 09/01/2019   Anaphylactic shock due to adverse food reaction 01/22/2019   Seasonal and perennial allergic rhinitis 01/22/2019   Morbid obesity with BMI of 40.0-44.9, adult ([place]) 10/28/2017   Moderate persistent asthma without complication 0000000   Essential hypertension 04/22/2017   Dysmenorrhea 06/26/2016   Menorrhagia with regular cycle 06/26/2016   Gastroesophageal reflux disease 12/22/2015   Iron deficiency anemia 11/02/2014   Vitamin D deficiency 11/02/2014    Gar Ponto MS, PT 01/10/21 9:15 PM  [facility] [address] [place], [place], [address] Phone: [phone]   Fax:   [phone]  Name: Nicole Mendoza MRN: [id number] Date of Birth: 1990-09-26

## 2021-01-14 ENCOUNTER — Encounter: Payer: Self-pay | Admitting: Family Medicine

## 2021-01-16 ENCOUNTER — Ambulatory Visit: Payer: 59

## 2021-01-16 NOTE — Telephone Encounter (Signed)
Please have her continue with the current plan she is on now. She is currently doing too the right things. If she becomes symptomatic she should test. We can discuss an antiviral should she have a positive test result. Please have her call the clinic with any questions. Thank you

## 2021-01-17 NOTE — Telephone Encounter (Signed)
Patient called back and states that she is having problems with her immune system. Patient has drainage and a sore throat that started Monday. Patient has tested for COVID twice, and she was negative. Patient was requesting an appointment with Dr. Ernst Bowler or [name], but there are no openings. Patient has been taking Zithromax and was wondering if she should switch antibiotics.  Please advise.

## 2021-01-18 MED ORDER — CEFDINIR 300 MG PO CAPS: 300.0000 mg | ORAL_CAPSULE | Freq: Two times a day (BID) | ORAL | 0 refills | Status: AC

## 2021-01-18 NOTE — Telephone Encounter (Signed)
Patient emailed me reporting continued symptoms of worsening congestion and sinus pain. Sheis using Mucinex and all of her other medications. COVID testing negative x2 after known exposure on Saturday.  I also recommended that she start her immunoglobulin.   Salvatore Marvel, MD [facility] [place] [facility] [place]

## 2021-01-22 NOTE — Telephone Encounter (Signed)
M  [id number]  Long story about today  Could we get BP and HR ( orthostatics from her)  Thanks SK

## 2021-01-23 ENCOUNTER — Other Ambulatory Visit: Payer: Self-pay

## 2021-01-23 ENCOUNTER — Ambulatory Visit: Payer: 59 | Attending: Nurse Practitioner

## 2021-01-23 DIAGNOSIS — Z8616 Personal history of COVID-19: Secondary | ICD-10-CM | POA: Insufficient documentation

## 2021-01-23 DIAGNOSIS — U099 Post covid-19 condition, unspecified: Secondary | ICD-10-CM | POA: Diagnosis present

## 2021-01-23 DIAGNOSIS — R5381 Other malaise: Secondary | ICD-10-CM | POA: Diagnosis present

## 2021-01-23 NOTE — Therapy (Signed)
[place], [place], [address] Phone: [phone]   Fax:  [phone]  Physical Therapy Treatment/Progress Note  Patient Details  Name: Nicole Mendoza MRN: [id number] Date of Birth: Nov 02, 1990 Referring Provider (PT): Fenton Foy, NP  Progress Note Reporting Period 11/29/20 to 01/23/21  See note below for Objective Data and Assessment of Progress/Goals.      Encounter Date: 01/23/2021   PT End of Session - 01/23/21 1003     Visit Number 10    Number of Visits 17    Date for PT Re-Evaluation 02/03/21    Authorization Time Period Complete FOTO 11th visit    PT Start Time 0935    PT Stop Time 1020    PT Time Calculation (min) 45 min    Activity Tolerance Patient tolerated treatment well    Behavior During Therapy WFL for tasks assessed/performed             Past Medical History:  Diagnosis Date   Allergic rhinitis    Allergy    Angio-edema    Asthma    Fatty liver    GERD (gastroesophageal reflux disease)    Hypertension    Recurrent upper respiratory infection (URI)    Tachycardia     Past Surgical History:  Procedure Laterality Date   NO PAST SURGERIES      There were no vitals filed for this visit.   Subjective Assessment - 01/23/21 0953     Subjective Pt reports she is in a bad POTS flare up. In addition, she is having mid back muscle spasms. Pt thinks she is not drinking enough water.    Patient Stated Goals I want to improve my endurance, to play with my 30 yo, and to be able to go to the grocery store    Currently in Pain? Yes    Pain Score 6     Pain Location Back    Pain Orientation Mid;Right    Pain Descriptors / Indicators Aching    Pain Type Acute pain    Pain Onset In the past 7 days    Pain Frequency Intermittent    Aggravating Factors  unsure                               [facility] Adult PT Treatment/Exercise - 01/23/21 0001       Self-Care    Self-Care Other Self-Care Comments    Other Self-Care Comments  Mid back massge per tennis ball      Exercises   Exercises Knee/Hip;Lumbar;Shoulder      Lumbar Exercises: Supine   Clam 20 reps;2 seconds    Clam Limitations green Tband    Bent Knee Raise 10 reps   2 sets   Bridge 10 reps;2 seconds   2 sets   Other Supine Lumbar Exercises Open book 10x, 10 sec, L and R    Other Supine Lumbar Exercises Cross chest pull c trunk rotation, 3x, 15 sec, L and R      Knee/Hip Exercises: Aerobic   Nustep L4; 10 mins; LEs only due to mid back pain   post ex: O2 sat 96%, HR 106, Fairly light exertion reported     Knee/Hip Exercises: Supine   Other Supine Knee/Hip Exercises Hip add sets c ball sqeeze 15x, 3 sec  PT Education - 01/23/21 0958     Education Details Use of tennis ball for r mid back massage. Undate with HEP.    Person(s) Educated Patient    Methods Explanation;Demonstration;Tactile cues;Verbal cues    Comprehension Verbalized understanding;Returned demonstration;Tactile cues required;Need further instruction              PT Short Term Goals - 12/27/20 1247       PT SHORT TERM GOAL #1   Title Pt will be Ind c a HEP to address endurance and strength.    Baseline Started on eval    Status Achieved    Target Date 12/27/20               PT Long Term Goals - 12/29/20 1017       PT LONG TERM GOAL #2   Title Pt will demonstrate an improved 6 min walking test to 1300 ft. to be able to better participate in ADLs, play with her son, and shopping involving prolonged walking. 12/29/20:1070 ft    Baseline 760f    Status On-going    Target Date 01/27/21                   Plan - 01/23/21 1746     Clinical Impression Statement Pt presents to PT not doing as well with pt reporting more isues with her POTS including being more light headed, weaker, and the development of mid back pain. PT was directed to toward endurance on the  Nustep at a lower intensity level than in the past, care for the mid back c tennis ball massge and ROM/stretching exs, and strengthening for the LEs. Following the session, pt reported her mid back pain was a 3/10. Pt tolerated the session without adverse effects. To be noted, pt's resting HR was elevated in comparison to past visits. Pt's CV response to the Nustep was similar today as to the past visits re: O2 sat and HR.    Personal Factors and Comorbidities Past/Current Experience;Comorbidity 1;Comorbidity 2;Comorbidity 3+    Comorbidities asthma, recurrent URI, tachycardia, obesity, CPAP use    Examination-Activity Limitations Locomotion Level;Squat;Stand    Examination-Participation Restrictions Occupation;Other;Meal Prep    Stability/Clinical Decision Making Stable/Uncomplicated    Clinical Decision Making Moderate    Rehab Potential Good    PT Frequency 2x / week    PT Duration 8 weeks    PT Treatment/Interventions ADLs/Self Care Home Management;Balance training;Therapeutic exercise;Therapeutic activities;Functional mobility training;Stair training;Gait training;Patient/family education    PT Next Visit Plan Progress ther ex to address activity tolerance and strength. Review mid back pain exs.Assess 5XSTS LTG.    PT Home Exercise Plan [id number]; [id number] for standing exs.    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 ([place] 11/19/2020   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   Recurrent sinusitis 11/09/2020  Pre-nodular edema of the vocal folds 11/09/2020   Laryngospasms 11/09/2020   ASCUS of cervix with negative high risk HPV 07/10/2020   Palpitations 09/02/2019   Cardiac murmur 09/02/2019   History of COVID-19 09/01/2019   Tachycardia 09/01/2019   Salivary stone 09/01/2019   Anaphylactic shock due to adverse food reaction 01/22/2019   Seasonal and perennial allergic rhinitis 01/22/2019   Morbid obesity with BMI of 40.0-44.9, adult ([place]) 10/28/2017   Moderate persistent asthma without complication 0000000   Essential hypertension 04/22/2017   Dysmenorrhea 06/26/2016   Menorrhagia with regular cycle 06/26/2016   Gastroesophageal reflux disease 12/22/2015   Iron deficiency anemia 11/02/2014   Vitamin D deficiency 11/02/2014   Gar Ponto MS, PT 01/23/21 6:03 PM   [facility] [address] [place], [place], [address] Phone: [phone]   Fax:  [phone]  Name: Nicole Mendoza MRN: [id number] Date of Birth: 08/28/1990

## 2021-01-25 ENCOUNTER — Other Ambulatory Visit: Payer: Self-pay

## 2021-01-25 ENCOUNTER — Ambulatory Visit (INDEPENDENT_AMBULATORY_CARE_PROVIDER_SITE_OTHER): Payer: 59 | Admitting: Allergy & Immunology

## 2021-01-25 ENCOUNTER — Encounter: Payer: Self-pay | Admitting: Allergy & Immunology

## 2021-01-25 VITALS — BP 140/80 | HR 99 | Temp 98.1°F | Resp 20

## 2021-01-25 DIAGNOSIS — J3089 Other allergic rhinitis: Secondary | ICD-10-CM | POA: Diagnosis not present

## 2021-01-25 DIAGNOSIS — T7800XD Anaphylactic reaction due to unspecified food, subsequent encounter: Secondary | ICD-10-CM

## 2021-01-25 DIAGNOSIS — J4540 Moderate persistent asthma, uncomplicated: Secondary | ICD-10-CM

## 2021-01-25 DIAGNOSIS — U099 Post covid-19 condition, unspecified: Secondary | ICD-10-CM

## 2021-01-25 DIAGNOSIS — D808 Other immunodeficiencies with predominantly antibody defects: Secondary | ICD-10-CM

## 2021-01-25 DIAGNOSIS — J302 Other seasonal allergic rhinitis: Secondary | ICD-10-CM

## 2021-01-25 DIAGNOSIS — K219 Gastro-esophageal reflux disease without esophagitis: Secondary | ICD-10-CM

## 2021-01-25 NOTE — Progress Notes (Signed)
FOLLOW UP  Date of Service/Encounter:  01/25/21   Assessment:   Mild persistent asthma - now back to being well-controlled on Asmanex BID but complicated by vocal cord dysfunction     Recurrent infections - improved following Pneumovax, but then worsened (streptococcal avidity assay abnormal), will be starting immunoglobulin therapy soon at 400 mg/kg/month equivalent   Seasonal and perennial allergic rhinitis (trees, weeds, grasses and dust mites) - not well controlled at all   Gastroesophageal reflux disease - on Dexilant (recently started) and famotidine and PRN carafate    Adverse food reactions - resolved with minimizing exposure to food additives and avoiding peanuts/tree nuts   Snoring with perceived poor sleep quality - improved with use of the CPAP   Possible POTS syndrome   Plan/Recommendations:   Asthma Continue montelukast 10 mg once a day to prevent cough or wheeze Continue Asthmanex 220- 2 puffs twice a day to prevent cough or wheeze Continue Spiriva 1.25 mcg-2 puffs twice a day to prevent cough or wheeze Continue levalbuterol 2 puffs every 4 hours as needed for cough or wheeze OR Instead use levalbuterol solution via nebulizer one unit vial every 4-6 hours as needed for cough or wheeze For asthma flares, continue Pulmicort 0.5 mg twice a day via nebulizer for 2 weeks or until cough and wheeze free.  Allergic rhinitis Continue allergen avoidance measures directed toward pollens and dust mites as listed below Continue saline rinses several times a day Continue cetirizine 10 mg once a day as needed for a runny nose or itch Continue Xhance 2 sprays in each nostril twice a day for nasal symptoms ADD ON nasal ipratropium one spray per nostril up to three times daily.  ADD ON RyVent '6mg'$  three times daily   Recurrent infections Continue azithromycin three days a week Continue to keep track of infections Please talk to the home health nurse regarding infusion at our  office OR training you and getting the infusion at our office.   Reflux Continue dietary and lifestyle modifications as listed below PLEASE CONTINUE Dexilant 60 mg once a day to prevent reflux. This will replace pantoprazole for now Continue famotidine 20 mg twice a day to prevent reflux  Vocal cord dysfunction Continue with speech therapy as you have been  Food allergy Continue to avoid peanuts and tree nuts.  In case of an allergic reaction, take Benadryl 50 mg every 4 hours, and if life-threatening symptoms occur, inject with AuviQ 0.3 mg.  Follow up for the food challenge in late September.    Subjective:   Nicole Mendoza is a 30 y.o. 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   Recurrent sinusitis 11/09/2020   Pre-nodular edema of the vocal folds 11/09/2020   Laryngospasms 11/09/2020   ASCUS of cervix with negative high risk HPV 07/10/2020   Palpitations 09/02/2019   Cardiac murmur 09/02/2019   History of COVID-19 09/01/2019   Tachycardia 09/01/2019   Salivary stone 09/01/2019   Anaphylactic shock due to adverse food  reaction 01/22/2019   Seasonal and perennial allergic rhinitis 01/22/2019   Morbid obesity with BMI of 40.0-44.9, adult ([place]) 10/28/2017   Moderate persistent asthma without complication 0000000   Essential hypertension 04/22/2017   Dysmenorrhea 06/26/2016   Menorrhagia with regular cycle 06/26/2016   Gastroesophageal reflux disease 12/22/2015   Iron deficiency anemia  11/02/2014   Vitamin D deficiency 11/02/2014    History obtained from: chart review and patient.  Nicole Mendoza is a 30 y.o. female presenting for a follow up visit.  She was last seen in June 2022.  At that time, her allergic rhinitis was not well controlled.  We continue with Zyrtec 10 mg daily as well as Singulair and Xhance.  She was already on maximized dose of Xhance 2 sprays twice daily.  We changed her to Xyzal to see if that would help anymore.  For her history of food allergies, we recommended continued avoidance of peanuts and tree nuts.  We made sure that her epinephrine autoinjector was up-to-date.  For her asthma, her spirometry was normal.  She was diagnosed with vocal cord dysfunction and a speech therapy referral was pending.  She also made the decision to go back to pulmonary rehabilitation.  She had oral candidiasis so we started her on fluconazole.  We recommended adding Spiriva back on to see if this helps.  She remained on her Asmanex 2 puffs twice daily and Singulair 10 mg daily.  She still has not started her specific antibody deficiency treatments with immunoglobulin.  She was continued on azithromycin 3 times a week.  Since last visit, she has not done great.  She never did start the immunoglobulin therapy.  She wants to get it in our office for the first time because she is concerned that she is going to have an allergic reaction.  We have tried to reassure her multiple times, but apparently in the discussion with the infusion nurse got her quite nervous.  She keeps putting it off, although she does have the drug in her fridge at home.  She is going to try to talk to the infusion nurse to see if she can talk to [facility] through the process and then Nicole Mendoza can come to our office for the first infusion.  She is really here today to talk about her allergies which are out of control.  She is on Xhance twice daily as well as Singulair and Xyzal.  She was having copious green purulent discharge a  few days ago and I sent in cefdinir for her.  She does endorse some frontal tenderness as well as dizziness.  She has been afebrile.  COVID testing has been negative.  She has a few more days of cefdinir left.  However, while the cefdinir has decreased the green discharge, she continues to have copious clear discharge.  Of note, she only recently started the Dexilant as and this morning.  I think she had a lot of Protonix left at home and she was try to use that up. She likely needs allergy shots, but she has not been removed for that.    Her asthma is under good control with the Asmanex and the Spiriva.  Her spirometry today reflects that.  She has not been using her rescue inhaler.  She actually is very interested in doing a peanut challenge.  She has been negative on s skin and blood.  She had component testing done in May 2019 which was negative to the entire panel.  Otherwise, there have  been no changes to her past medical history, surgical history, family history, or social history.    Review of Systems  Constitutional: Negative.  Negative for chills, fever, malaise/fatigue and weight loss.  HENT:  Positive for congestion and sinus pain. Negative for ear discharge and ear pain.        Positive for purulent discharge.  Eyes:  Negative for pain, discharge and redness.  Respiratory:  Positive for cough. Negative for sputum production, shortness of breath and wheezing.   Cardiovascular: Negative.  Negative for chest pain and palpitations.  Gastrointestinal:  Negative for abdominal pain, constipation, diarrhea, heartburn, nausea and vomiting.  Skin: Negative.  Negative for itching and rash.  Neurological:  Negative for dizziness and headaches.  Endo/Heme/Allergies:  Positive for environmental allergies. Does not bruise/bleed easily.      Objective:   Blood pressure 140/80, pulse 99, temperature 98.1 F (36.7 C), temperature source Temporal, resp. rate 20, SpO2 100 %. There is no height  or weight on file to calculate BMI.   Physical Exam:  Physical Exam Constitutional:      Appearance: She is well-developed. She is obese.     Comments: Talkative as always.  HENT:     Head: Normocephalic and atraumatic.     Right Ear: Tympanic membrane, ear canal and external ear normal.     Left Ear: Tympanic membrane, ear canal and external ear normal.     Nose: No nasal deformity, septal deviation, mucosal edema or rhinorrhea.     Right Turbinates: Enlarged, swollen and pale.     Left Turbinates: Enlarged, swollen and pale.     Right Sinus: No maxillary sinus tenderness or frontal sinus tenderness.     Left Sinus: No maxillary sinus tenderness or frontal sinus tenderness.     Mouth/Throat:     Mouth: Mucous membranes are not pale and not dry.     Pharynx: Uvula midline. No pharyngeal swelling or oropharyngeal exudate.     Tonsils: 2+ on the right. 2+ on the left.  Eyes:     General: Lids are normal. No allergic shiner.       Right eye: No discharge.        Left eye: No discharge.     Conjunctiva/sclera: Conjunctivae normal.     Right eye: Right conjunctiva is not injected. No chemosis.    Left eye: Left conjunctiva is not injected. No chemosis.    Pupils: Pupils are equal, round, and reactive to light.  Cardiovascular:     Rate and Rhythm: Normal rate and regular rhythm.     Heart sounds: Normal heart sounds.  Pulmonary:     Effort: Pulmonary effort is normal. No tachypnea, accessory muscle usage or respiratory distress.     Breath sounds: Normal breath sounds. No wheezing, rhonchi or rales.     Comments: Moving air well in all lung fields.  No increased work of breathing. Chest:     Chest wall: No tenderness.  Lymphadenopathy:     Cervical: No cervical adenopathy.  Skin:    General: Skin is warm.     Capillary Refill: Capillary refill takes less than 2 seconds.     Coloration: Skin is not pale.     Findings: No abrasion, erythema, petechiae or rash. Rash is not  papular, urticarial or vesicular.     Comments: No eczematous or urticarial lesions noted.  Neurological:     Mental Status: She is alert.  Psychiatric:        Behavior:  Behavior is cooperative.     Diagnostic studies:   Spirometry: results normal (FEV1: 3.22/89%, FVC: 3.86/90%, FEV1/FVC: 83%).    Spirometry consistent with normal pattern.    Allergy Studies: none        Salvatore Marvel, MD  [facility]

## 2021-01-25 NOTE — Patient Instructions (Addendum)
Asthma Continue montelukast 10 mg once a day to prevent cough or wheeze Continue Asthmanex 220- 2 puffs twice a day to prevent cough or wheeze Continue Spiriva 1.25 mcg-2 puffs twice a day to prevent cough or wheeze Continue levalbuterol 2 puffs every 4 hours as needed for cough or wheeze OR Instead use levalbuterol solution via nebulizer one unit vial every 4-6 hours as needed for cough or wheeze For asthma flares, continue Pulmicort 0.5 mg twice a day via nebulizer for 2 weeks or until cough and wheeze free.  Allergic rhinitis Continue allergen avoidance measures directed toward pollens and dust mites as listed below Continue saline rinses several times a day Continue cetirizine 10 mg once a day as needed for a runny nose or itch Continue Xhance 2 sprays in each nostril twice a day for nasal symptoms ADD ON nasal ipratropium one spray per nostril up to three times daily.  ADD ON RyVent '6mg'$  three times daily   Recurrent infections Continue azithromycin three days a week Continue to keep track of infections Please talk to the home health nurse regarding infusion at our office OR training you and getting the infusion at our office.   Reflux Continue dietary and lifestyle modifications as listed below PLEASE CONTINUE Dexilant 60 mg once a day to prevent reflux. This will replace pantoprazole for now Continue famotidine 20 mg twice a day to prevent reflux  Vocal cord dysfunction Continue with speech therapy as you have been  Food allergy Continue to avoid peanuts and tree nuts.  In case of an allergic reaction, take Benadryl 50 mg every 4 hours, and if life-threatening symptoms occur, inject with AuviQ 0.3 mg.  Follow up for the food challenge in late September.    Please inform us of any Emergency Department visits, hospitalizations, or changes in symptoms. Call us before going to the ED for breathing or allergy symptoms since we might be able to fit you in for a sick visit. Feel free  to contact us anytime with any questions, problems, or concerns.  It was a pleasure to see you again today!  Websites that have reliable patient information: 1. American Academy of Asthma, Allergy, and Immunology: www.aaaai.org 2. Food Allergy Research and Education (FARE): foodallergy.org 3. Mothers of Asthmatics: http://www.asthmacommunitynetwork.org 4. American College of Allergy, Asthma, and Immunology: www.acaai.org   COVID-19 Vaccine Information can be found at: ShippingScam.co.uk For questions related to vaccine distribution or appointments, please email [email or web address] or call [phone].   We realize that you might be concerned about having an allergic reaction to the COVID19 vaccines. To help with that concern, WE ARE OFFERING THE COVID19 VACCINES IN OUR OFFICE! Ask the front desk for dates!     "Like" [place] on Facebook and Instagram for our latest updates!      A healthy democracy works best when [employer] participate! Make sure you are registered to vote! If you have moved or changed any of your contact information, you will need to get this updated before voting!  In some cases, you MAY be able to register to vote online: [email or web address]

## 2021-01-27 ENCOUNTER — Encounter: Payer: Self-pay | Admitting: Allergy & Immunology

## 2021-01-30 ENCOUNTER — Ambulatory Visit: Payer: 59

## 2021-01-30 ENCOUNTER — Other Ambulatory Visit: Payer: Self-pay

## 2021-01-30 VITALS — HR 92

## 2021-01-30 DIAGNOSIS — R5381 Other malaise: Secondary | ICD-10-CM

## 2021-01-30 DIAGNOSIS — U099 Post covid-19 condition, unspecified: Secondary | ICD-10-CM

## 2021-01-30 DIAGNOSIS — Z8616 Personal history of COVID-19: Secondary | ICD-10-CM

## 2021-01-30 NOTE — Patient Instructions (Addendum)

## 2021-01-30 NOTE — Progress Notes (Addendum)
PATIENT: Nicole Mendoza DOB: Sep 09, 1990  REASON FOR VISIT: follow up HISTORY FROM: patient  Virtual Visit via Telephone Note  I connected with Nicole Mendoza on 01/31/21 at  7:30 AM EDT by telephone and verified that I am speaking with the correct person using two identifiers.   I discussed the limitations, risks, security and privacy concerns of performing an evaluation and management service by telephone and the availability of in person appointments. I also discussed with the patient that there may be a patient responsible charge related to this service. The patient expressed understanding and agreed to proceed.   History of Present Illness:  01/30/21 ALL: Nicole Mendoza returns for follow up for OSA on CPAP and post Covid anosmia and ageusia. She continues CPAP nightly and feels she is doing well. 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 She reports doing well on CPAP. She is using her machine every night. She feels that she is doing better post Covid in 2020 with recurrent viral symptoms in 09/2020. She did not start carbamazepine. She does have difficulty waking in the mornings. Her husband reports trying to wake her up for over an hour, recently. She is able to maintain responsibilities and lifestyle.     01/12/2020 CD:  Nicole Mendoza is a 30 y.o. year old White or Caucasian female patient seen here upon PCP  referral on 01/12/2020 from Nicole Arms, NP .   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 Rexene Alberts the same month she contracted Covid.  She became very short of breath and since she had an underlying condition of asthma struggled for over 10 weeks with significant respiratory difficulties, she remains extremely fatigued with myalgia, limited exercise tolerance and she was also evaluated for POTS.  Autoimmune disorders work-up was negative by rheumatology  cardiology.  She reports ongoing smelling smoke and the character of her olfactory triggers a set of foot burning food or meat.  She also becomes very nauseated when she smells ciggarette smoke , also there is no source of the "burning" smells.    I have the pleasure of seeing Nicole Anchors, RN  today, a right-handed White or Caucasian female with a loss of taste and smell after COVID 19, she has a  has a past medical history of Allergic rhinitis, Angio-edema, Asthma, Fatty liver, GERD (gastroesophageal reflux disease), Hypertension, Recurrent upper respiratory infection (URI), and Tachycardia.    Dr. De Nurse tested the patient for sleep apnea the results were positive and she has been using CPAP over 11 months now.  She is compliant.  10/07/2019 ALL: Nicole Mendoza is a 30 y.o. female here today for follow up of OSA on CPAP.  She reports that she is doing very well with CPAP therapy.  She is using her machine every night.  She does continue close follow-up with primary care post Covid.  She continues to struggle with intermittent fatigue and pain.  She is now working with physical therapy to help with strengthening and endurance.   Compliance report dated 09/06/2019 through 10/05/2019 reveals that she is used CPAP every night for compliance of 100%.  She has used CPAP greater than 4 hours every night.  Average usage is 7 hours and 30 minutes.  Residual AHI 0.4 on 5 to 10 cm of water and an EPR of 1.  There was no significant leak noted.  Observations/Objective:  Generalized: Well developed, in no acute distress  Mentation: Alert oriented to time, place, history taking. Follows all commands speech and language fluent   Assessment and Plan:  30 y.o. year old female  has a past medical history of Allergic rhinitis, Allergy, Angio-edema, Asthma, Fatty liver, GERD (gastroesophageal reflux disease), Hypertension, Recurrent upper respiratory infection (URI), and Tachycardia. here with    ICD-10-CM   1. OSA on  CPAP  G47.33    Z99.89     2. History of COVID-19  Z86.16      Nicole Mendoza is doing well on CPAP therapy. Compliance report shows excellent compliance. She does continue to have some difficulty waking in the mornings but is working closely with care team post Covid infection. She will focus on healthy lifestyle habits. She will continue CPAP nightly for at least 4 hours. She will follow up with me in 1 year.    No orders of the defined types were placed in this encounter.   No orders of the defined types were placed in this encounter.    Follow Up Instructions:  I discussed the assessment and treatment plan with the patient. The patient was provided an opportunity to ask questions and all were answered. The patient agreed with the plan and demonstrated an understanding of the instructions.   The patient was advised to call back or seek an in-person evaluation if the symptoms worsen or if the condition fails to improve as anticipated.  I provided 15 minutes of non-face-to-face time during this encounter. Patient located at their place of residence during [facility] visit. Provider is in the office.    Debbora Presto, NP   I reviewed the above note and documentation by the Nurse Practitioner and agree with the history, exam, assessment and plan as outlined above. I was available for consultation. Star Age, MD, PhD [facility])

## 2021-01-30 NOTE — Therapy (Signed)
[place], [place], [address] Phone: [phone]   Fax:  [phone]  Physical Therapy Treatment/Discharge  Patient Details  Name: Nicole Mendoza MRN: [id number] Date of Birth: 14-Jun-1991 Referring Provider (PT): Fenton Foy, NP   Encounter Date: 01/30/2021   PT End of Session - 01/30/21 0947     Visit Number 11    Number of Visits 17    Date for PT Re-Evaluation 02/03/21    Authorization Type [employer] OTHER    Authorization Time Period Complete FOTO 11th visit    PT Start Time [id number]    PT Stop Time 1017    PT Time Calculation (min) 39 min    Activity Tolerance Patient tolerated treatment well    Behavior During Therapy WFL for tasks assessed/performed             Past Medical History:  Diagnosis Date   Allergic rhinitis    Allergy    Angio-edema    Asthma    Fatty liver    GERD (gastroesophageal reflux disease)    Hypertension    Recurrent upper respiratory infection (URI)    Tachycardia     Past Surgical History:  Procedure Laterality Date   NO PAST SURGERIES      Vitals:   01/30/21 1002  Pulse: 92  SpO2: 99%     Subjective Assessment - 01/30/21 0943     Subjective Pt reports she stopped taking spriva because of her increased dizziness. She has been off of it for 5 days and the dissiness has been better. Pt notes her energy has been lower recently.    Patient Stated Goals I want to improve my endurance, to play with my 30 yo, and to be able to go to the grocery store    Currently in Pain? No/denies                [facility] PT Assessment - 01/30/21 0001       Observation/Other Assessments   Focus on Therapeutic Outcomes (FOTO)  73%      Transfers   Five time sit to stand comments  12.4 sec      Ambulation/Gait   Gait Comments [id number] ft                           [facility] Adult PT Treatment/Exercise - 01/30/21 0001       Knee/Hip  Exercises: Aerobic   Nustep L4; 10 mins; LEs only due to mid back pain   post ex: O2 sat 99%, HR 110, Fairly light exertion reported                     PT Short Term Goals - 12/27/20 1247       PT SHORT TERM GOAL #1   Title Pt will be Ind c a HEP to address endurance and strength.    Baseline Started on eval    Status Achieved    Target Date 12/27/20               PT Long Term Goals - 01/30/21 1013       PT LONG TERM GOAL #1   Title Pt will be Ind in a final HEP to maintain or progress achieved LOF    Status Achieved    Target Date 01/30/21      PT LONG TERM GOAL #2   Title Pt will demonstrate  an improved 6 min walking test to 1300 ft. to be able to better participate in ADLs, play with her son, and shopping involving prolonged walking. 12/29/20:1070 ft. 01/30/21- 1452f    Status Achieved    Target Date 01/30/21      PT LONG TERM GOAL #3   Title Pt will demonstrate an improved STS to 15 sec as demonstration of improved LE strength and activity tolerance. 01/30/21-12.40 sec    Baseline 29.31    Status Achieved    Target Date 01/30/21      PT LONG TERM GOAL #4   Title Increase pt's bilat hip strength to 4+/5 or greater for improved tolerance to functional mobility. 8/61/22- 5/5    Status Achieved    Target Date 01/30/21      PT LONG TERM GOAL #5   Title Improve pt's FOTO score to predicted value of 65% functional ability. 12/15/20: 56%. 01/30/21-73%    Baseline 47%    Status Achieved    Target Date 01/30/21                   Plan - 01/30/21 0950     Clinical Impression Statement Pt was seen for PT to be re-assessed for functional mobility, activity tolerance and CV response, and for strengthening and finalizing her HEP. All functional measures have improved as well as goals, see goals. Pt is Ind in a final HEP to continue the rehab process at home. Pt is in agreement with DC from PT at this time.    Personal Factors and Comorbidities Past/Current  Experience;Comorbidity 1;Comorbidity 2;Comorbidity 3+    Comorbidities asthma, recurrent URI, tachycardia, obesity, CPAP use    Examination-Activity Limitations Locomotion Level;Squat;Stand    Examination-Participation Restrictions Occupation;Other;Meal Prep    Stability/Clinical Decision Making Stable/Uncomplicated    Clinical Decision Making Moderate    Rehab Potential Good    PT Frequency 2x / week    PT Duration 8 weeks    PT Treatment/Interventions ADLs/Self Care Home Management;Balance training;Therapeutic exercise;Therapeutic activities;Functional mobility training;Stair training;Gait training;Patient/family education    PT Home Exercise Plan [id number]; [id number] for standing exs.    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 11/22/2020   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 ([place] 11/19/2020   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   Recurrent sinusitis 11/09/2020   Pre-nodular edema of the vocal folds 11/09/2020   Laryngospasms 11/09/2020   ASCUS of cervix with negative high risk HPV 07/10/2020   Palpitations 09/02/2019   Cardiac murmur 09/02/2019   History of COVID-19 09/01/2019   Tachycardia 09/01/2019   Salivary stone 09/01/2019   Anaphylactic shock due to adverse food reaction 01/22/2019   Seasonal and perennial allergic rhinitis 01/22/2019    Morbid obesity with BMI of 40.0-44.9, adult ([place] 10/28/2017   Moderate persistent asthma without complication 000/92/3300  Essential hypertension 04/22/2017   Dysmenorrhea 06/26/2016  Menorrhagia with regular cycle 06/26/2016   Gastroesophageal reflux disease 12/22/2015   Iron deficiency anemia 11/02/2014   Vitamin D deficiency 11/02/2014   PHYSICAL THERAPY DISCHARGE SUMMARY  Visits from Start of Care: 11  Current functional level related to goals / functional outcomes: See above   Remaining deficits: See above   Education / Equipment: HEP   Patient agrees to discharge. Patient goals were met. Patient is being discharged due to being pleased with the current functional level.   Gar Ponto 01/30/2021, 3:09 PM  [facility] [address] [place], [place], [address] Phone: [phone]   Fax:  [phone]  Name: Nicole Mendoza MRN: [id number] Date of Birth: February 03, 1991

## 2021-01-31 ENCOUNTER — Encounter: Payer: Self-pay | Admitting: Family Medicine

## 2021-01-31 ENCOUNTER — Telehealth (INDEPENDENT_AMBULATORY_CARE_PROVIDER_SITE_OTHER): Payer: 59 | Admitting: Family Medicine

## 2021-01-31 DIAGNOSIS — Z8616 Personal history of COVID-19: Secondary | ICD-10-CM

## 2021-01-31 DIAGNOSIS — Z9989 Dependence on other enabling machines and devices: Secondary | ICD-10-CM

## 2021-01-31 DIAGNOSIS — G4733 Obstructive sleep apnea (adult) (pediatric): Secondary | ICD-10-CM

## 2021-01-31 NOTE — Progress Notes (Signed)
Faxed supplies order to [facility] at [phone]. Received fax confirmation.

## 2021-02-03 ENCOUNTER — Other Ambulatory Visit: Payer: Self-pay | Admitting: Allergy & Immunology

## 2021-02-07 ENCOUNTER — Other Ambulatory Visit: Payer: Self-pay | Admitting: Allergy & Immunology

## 2021-02-07 DIAGNOSIS — J3089 Other allergic rhinitis: Secondary | ICD-10-CM

## 2021-02-07 DIAGNOSIS — J45901 Unspecified asthma with (acute) exacerbation: Secondary | ICD-10-CM

## 2021-02-18 IMAGING — DX PORTABLE CHEST - 1 VIEW
1 series · 1 of 1 positions shown · non-contrast
Comparison: None.

CLINICAL DATA: Cough and shortness of breath

EXAM:
PORTABLE CHEST 1 VIEW

[chest ap]
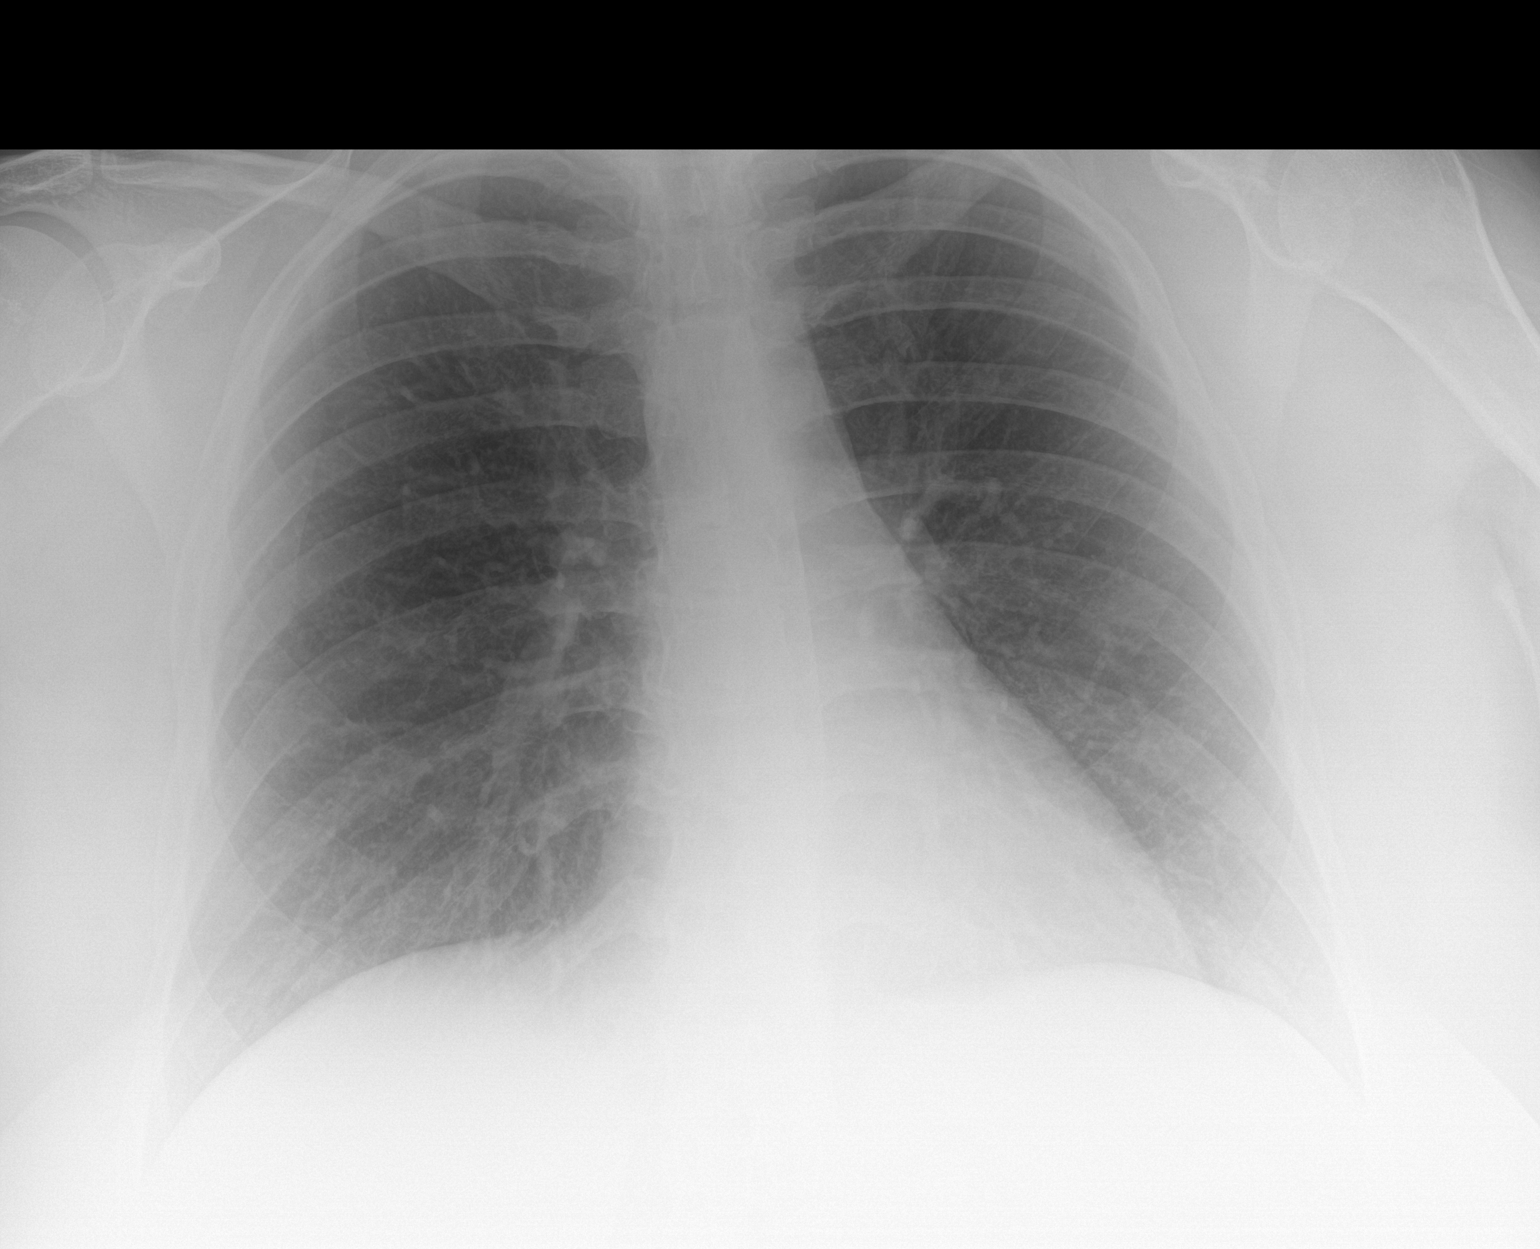

[1 of 1 positions shown; findings below may reference images not displayed]

FINDINGS: Lungs are clear. Heart size and pulmonary vascularity are normal. No
adenopathy. No bone lesions.
IMPRESSION: No abnormality noted.

## 2021-02-20 ENCOUNTER — Ambulatory Visit: Payer: 59 | Admitting: Allergy & Immunology

## 2021-02-20 NOTE — Progress Notes (Deleted)
   [place] [address] Dept: [phone]  FOLLOW UP NOTE  Patient ID: Nicole Mendoza, female    DOB: December 16, 1990  Age: 30 y.o. MRN: [id number] Date of Office Visit: 02/21/2021  Assessment  Chief Complaint: No chief complaint on file.  HPI Nicole Mendoza    Drug Allergies:  Allergies  Allergen Reactions   Budesonide-Formoterol Fumarate Other (See Comments)    Causes Hypertension   Penicillins Hives   Raspberry Hives   Albuterol Palpitations   Losartan Rash    Physical Exam: There were no vitals taken for this visit.   Physical Exam  Diagnostics:    Assessment and Plan: No diagnosis found.  No orders of the defined types were placed in this encounter.   There are no Patient Instructions on file for this visit.  No follow-ups on file.    Thank you for the opportunity to care for this patient.  Please do not hesitate to contact me with questions.  Gareth Morgan, FNP [facility]

## 2021-02-21 ENCOUNTER — Encounter: Payer: 59 | Admitting: Family Medicine

## 2021-02-22 ENCOUNTER — Encounter: Payer: Self-pay | Admitting: Allergy & Immunology

## 2021-02-22 ENCOUNTER — Other Ambulatory Visit: Payer: Self-pay

## 2021-02-22 ENCOUNTER — Ambulatory Visit (INDEPENDENT_AMBULATORY_CARE_PROVIDER_SITE_OTHER): Payer: 59 | Admitting: Allergy & Immunology

## 2021-02-22 VITALS — BP 134/84 | HR 85 | Temp 98.0°F | Resp 16 | Ht 68.0 in

## 2021-02-22 DIAGNOSIS — J3089 Other allergic rhinitis: Secondary | ICD-10-CM | POA: Diagnosis not present

## 2021-02-22 DIAGNOSIS — D808 Other immunodeficiencies with predominantly antibody defects: Secondary | ICD-10-CM | POA: Diagnosis not present

## 2021-02-22 DIAGNOSIS — K219 Gastro-esophageal reflux disease without esophagitis: Secondary | ICD-10-CM | POA: Diagnosis not present

## 2021-02-22 DIAGNOSIS — J4540 Moderate persistent asthma, uncomplicated: Secondary | ICD-10-CM

## 2021-02-22 DIAGNOSIS — T7800XD Anaphylactic reaction due to unspecified food, subsequent encounter: Secondary | ICD-10-CM

## 2021-02-22 DIAGNOSIS — J302 Other seasonal allergic rhinitis: Secondary | ICD-10-CM

## 2021-02-22 MED ORDER — INCRUSE ELLIPTA 62.5 MCG/INH IN AEPB: 1.0000 | INHALATION_SPRAY | Freq: Every day | RESPIRATORY_TRACT | 5 refills | Status: AC

## 2021-02-22 MED ORDER — FLUCONAZOLE 150 MG PO TABS: 150.0000 mg | ORAL_TABLET | Freq: Once | ORAL | 1 refills | Status: DC

## 2021-02-22 NOTE — Progress Notes (Signed)
FOLLOW UP  Date of Service/Encounter:  02/22/21   Assessment:   Mild persistent asthma - now back to being well-controlled on Asmanex BID but complicated by vocal cord dysfunction     Recurrent infections - improved following Pneumovax, but then worsened (streptococcal avidity assay abnormal), will be starting immunoglobulin therapy soon at 400 mg/kg/month equivalent   Seasonal and perennial allergic rhinitis (trees, weeds, grasses and dust mites) - not well controlled at all   Gastroesophageal reflux disease - on Dexilant (recently started) and famotidine and PRN carafate    Adverse food reactions - resolved with minimizing exposure to food additives and avoiding peanuts/tree nuts   Snoring with perceived poor sleep quality - improved with use of the CPAP   Possible POTS syndrome     Plan/Recommendations:   Asthma Continue montelukast 10 mg once a day to prevent cough or wheeze Continue Asthmanex 220- 2 puffs twice a day to prevent cough or wheeze Add Incruse one puff once daily (samples will be at [employer]). Continue levalbuterol 2 puffs every 4 hours as needed for cough or wheeze OR Instead use levalbuterol solution via nebulizer one unit vial every 4-6 hours as needed for cough or wheeze For asthma flares, continue Pulmicort 0.5 mg twice a day via nebulizer for 2 weeks or until cough and wheeze free.  Allergic rhinitis Continue saline rinses several times a day Continue cetirizine 10 mg once a day as needed for a runny nose or itch Continue Xhance 2 sprays in each nostril twice a day for nasal symptoms  Recurrent infections START YOUR HIZENTRA!  I will send [name] a message.  Continue to keep track of infections  Reflux Continue dietary and lifestyle modifications as listed below PLEASE CONTINUE Dexilant 60 mg once a day to prevent reflux.  Continue famotidine 20 mg twice a day to prevent reflux  Vocal cord dysfunction Continue with speech therapy as you have  been  Food allergy Continue to avoid peanuts and tree nuts.  In case of an allergic reaction, take Benadryl 50 mg every 4 hours, and if life-threatening symptoms occur, inject with AuviQ 0.3 mg.  Follow up for the food challenge in late September.   Subjective:   Nicole Mendoza is a 30 y.o. 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 ([place]) 11/19/2020   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   Recurrent sinusitis 11/09/2020   Pre-nodular edema of the vocal folds 11/09/2020   Laryngospasms 11/09/2020   ASCUS of cervix with negative high risk HPV 07/10/2020   Palpitations 09/02/2019   Cardiac murmur 09/02/2019   History of COVID-19 09/01/2019   Tachycardia 09/01/2019   Salivary stone 09/01/2019   Anaphylactic shock due to adverse food reaction 01/22/2019   Seasonal and perennial allergic rhinitis 01/22/2019   Morbid obesity with BMI of 40.0-44.9, adult ([place]) 10/28/2017  Moderate persistent asthma without complication 0000000   Essential hypertension 04/22/2017   Dysmenorrhea 06/26/2016   Menorrhagia with regular cycle 06/26/2016   Gastroesophageal reflux disease 12/22/2015   Iron deficiency anemia 11/02/2014   Vitamin D deficiency 11/02/2014    History obtained from: chart review and patient.  [name] is a 30 y.o. female presenting for a follow up visit.  She was last seen in August 2022.  At that time,  we continue montelukast as well as Asmanex and Spiriva as well as Xopenex.  For her allergic rhinitis, we added on nasal ipratropium as well as RyVent 6 mg 3 times daily.  We continue with Xhance and cetirizine.  We had discussed allergen immunotherapy on a number of occasions.  For her specific antibody deficiency.  We continued with azithromycin 3 times weekly.  She was approved for Hizentra, but she has been dragging her feet on starting it.  For her reflux, she was continued on Dexilant 60 mg daily as well as famotidine 20 mg twice daily.  She continue with speech therapy for her vocal cord dysfunction.  She also continue to avoid peanuts and tree nuts.  In the interim, she was scheduled for a food challenge but was unable to stop her antihistamines.  She has expressed interest in getting rid of this peanut allergy.  Asthma/Respiratory Symptom History: Her breathing is giving her some issues. She was having a lot of dizziness with the Spiriva. She stopped it and her vocal cords have been better and the drainage slowed down.  However, she is having some more shortness of breath and has been using her Xopenex more at night.  She remains on Asmanex 2 puffs twice daily.  She does have some thrush today.  Allergic Rhinitis Symptom History: She remains on the [place] as well as the cetirizine.  She stopped using the nasal ipratropium because stopping the Spiriva helped so much with the postnasal drip.  She never did get the RyVent.  She has not started her immunoglobulin infusions.  She is open to doing it on a Saturday with one of our medical assistants, who is also one of her best friends, if she can come to the house while the infusion nurse meets with [place] to go over how to infuse.  Food Allergy Symptom History: She continues to avoid peanuts and tree nuts.  She is open to doing a peanut challenge.  She cannot come off of her antihistamines, however.  Otherwise, her environmental allergies would be out of  control.  Otherwise, there have been no changes to her past medical history, surgical history, family history, or social history.    Review of Systems  Constitutional:  Positive for malaise/fatigue. Negative for chills, fever and weight loss.  HENT:  Negative for congestion, ear discharge and ear pain.   Eyes:  Negative for pain, discharge and redness.  Respiratory:  Positive for cough. Negative for sputum production, shortness of breath and wheezing.   Cardiovascular: Negative.  Negative for chest pain and palpitations.  Gastrointestinal:  Negative for abdominal pain, constipation, diarrhea, heartburn, nausea and vomiting.  Skin: Negative.  Negative for itching and rash.  Neurological:  Negative for dizziness and headaches.  Endo/Heme/Allergies:  Positive for environmental allergies. Does not bruise/bleed easily.      Objective:   Blood pressure 134/84, pulse 85, temperature 98 F (36.7 C), resp. rate 16, height '5\' 8"'$  (1.727 m), SpO2 98 %. Body mass index is 43.7 kg/m.   Physical Exam:  Physical Exam  Vitals reviewed.  Constitutional:      Appearance: She is well-developed.  HENT:     Head: Normocephalic and atraumatic.     Right Ear: Tympanic membrane, ear canal and external ear normal.     Left Ear: Tympanic membrane, ear canal and external ear normal.     Nose: No nasal deformity, septal deviation, mucosal edema or rhinorrhea.     Right Turbinates: Enlarged and swollen.     Left Turbinates: Enlarged and swollen.     Right Sinus: No maxillary sinus tenderness or frontal sinus tenderness.     Left Sinus: No maxillary sinus tenderness or frontal sinus tenderness.     Mouth/Throat:     Mouth: Mucous membranes are not pale and not dry.     Pharynx: Uvula midline.  Eyes:     General: Lids are normal. No allergic shiner.       Right eye: No discharge.        Left eye: No discharge.     Conjunctiva/sclera: Conjunctivae normal.     Right eye: Right conjunctiva is not  injected. No chemosis.    Left eye: Left conjunctiva is not injected. No chemosis.    Pupils: Pupils are equal, round, and reactive to light.  Cardiovascular:     Rate and Rhythm: Normal rate and regular rhythm.     Heart sounds: Normal heart sounds.  Pulmonary:     Effort: Pulmonary effort is normal. No tachypnea, accessory muscle usage or respiratory distress.     Breath sounds: Normal breath sounds. No wheezing, rhonchi or rales.     Comments: Moving air well in all lung fields. She does seem to have some deep breathing, verging on sighing.  Chest:     Chest wall: No tenderness.  Lymphadenopathy:     Cervical: No cervical adenopathy.  Skin:    General: Skin is warm.     Capillary Refill: Capillary refill takes less than 2 seconds.     Coloration: Skin is not pale.     Findings: No abrasion, erythema, petechiae or rash. Rash is not papular, urticarial or vesicular.  Neurological:     Mental Status: She is alert.  Psychiatric:        Behavior: Behavior is cooperative.     Diagnostic studies: none      Salvatore Marvel, MD  [facility]

## 2021-02-22 NOTE — Patient Instructions (Signed)
Asthma Continue montelukast 10 mg once a day to prevent cough or wheeze Continue Asthmanex 220- 2 puffs twice a day to prevent cough or wheeze Add Incruse one puff once daily (samples will be at [facility]). Continue levalbuterol 2 puffs every 4 hours as needed for cough or wheeze OR Instead use levalbuterol solution via nebulizer one unit vial every 4-6 hours as needed for cough or wheeze For asthma flares, continue Pulmicort 0.5 mg twice a day via nebulizer for 2 weeks or until cough and wheeze free.  Allergic rhinitis Continue saline rinses several times a day Continue cetirizine 10 mg once a day as needed for a runny nose or itch Continue Xhance 2 sprays in each nostril twice a day for nasal symptoms  Recurrent infections START YOUR HIZENTRA!  I will send [name] a message.  Continue to keep track of infections  Reflux Continue dietary and lifestyle modifications as listed below PLEASE CONTINUE Dexilant 60 mg once a day to prevent reflux.  Continue famotidine 20 mg twice a day to prevent reflux  Vocal cord dysfunction Continue with speech therapy as you have been  Food allergy Continue to avoid peanuts and tree nuts.  In case of an allergic reaction, take Benadryl 50 mg every 4 hours, and if life-threatening symptoms occur, inject with AuviQ 0.3 mg.  Follow up for the food challenge in late September.    Please inform us of any Emergency Department visits, hospitalizations, or changes in symptoms. Call us before going to the ED for breathing or allergy symptoms since we might be able to fit you in for a sick visit. Feel free to contact us anytime with any questions, problems, or concerns.  It was a pleasure to see you again today!  Websites that have reliable patient information: 1. American Academy of Asthma, Allergy, and Immunology: www.aaaai.org 2. Food Allergy Research and Education (FARE): foodallergy.org 3. Mothers of Asthmatics: http://www.asthmacommunitynetwork.org 4.  American College of Allergy, Asthma, and Immunology: www.acaai.org   COVID-19 Vaccine Information can be found at: ShippingScam.co.uk For questions related to vaccine distribution or appointments, please email [email or web address] or call [phone].   We realize that you might be concerned about having an allergic reaction to the COVID19 vaccines. To help with that concern, WE ARE OFFERING THE COVID19 VACCINES IN OUR OFFICE! Ask the front desk for dates!     "Like" [place] on Facebook and Instagram for our latest updates!      A healthy democracy works best when [employer] participate! Make sure you are registered to vote! If you have moved or changed any of your contact information, you will need to get this updated before voting!  In some cases, you MAY be able to register to vote online: [email or web address]

## 2021-02-25 ENCOUNTER — Encounter: Payer: Self-pay | Admitting: Allergy & Immunology

## 2021-02-25 MED ORDER — PREDNISONE 10 MG PO TABS: ORAL_TABLET | ORAL | 0 refills | Status: DC

## 2021-02-25 NOTE — Addendum Note (Signed)
Addended by: Valentina Shaggy on: 02/25/2021 11:02 PM   Modules accepted: Orders

## 2021-02-26 ENCOUNTER — Ambulatory Visit (INDEPENDENT_AMBULATORY_CARE_PROVIDER_SITE_OTHER): Payer: 59 | Admitting: Family Medicine

## 2021-02-26 ENCOUNTER — Other Ambulatory Visit: Payer: Self-pay

## 2021-02-26 ENCOUNTER — Encounter: Payer: Self-pay | Admitting: Family Medicine

## 2021-02-26 VITALS — BP 138/84 | HR 84 | Temp 97.2°F | Wt 280.5 lb

## 2021-02-26 DIAGNOSIS — I10 Essential (primary) hypertension: Secondary | ICD-10-CM | POA: Diagnosis not present

## 2021-02-26 DIAGNOSIS — J4540 Moderate persistent asthma, uncomplicated: Secondary | ICD-10-CM

## 2021-02-26 DIAGNOSIS — G4733 Obstructive sleep apnea (adult) (pediatric): Secondary | ICD-10-CM | POA: Diagnosis not present

## 2021-02-26 NOTE — Progress Notes (Signed)
[facility] [address] [place] [address] Dept: [phone] Dept Fax: [phone]  Chronic Care Office Visit  Subjective:    Patient ID: Nicole Mendoza, female    DOB: 1991/03/04, 30 y.o..   MRN: [id number]  Chief Complaint  Patient presents with   Follow-up    3 mo follow up reassessment, pt states she is having trouble waking up in the morning. States she has seen neuro and everything checked out.     History of Present Illness:  Patient is in today for reassessment of chronic medical issues.  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 She feels she is doing okay at this point, though has not returned to her pre-COVID level of function. She is managing okay with her asthma currently. She had some side effects from her use of Spiriva, so this was stopped by Dr. Ernst Bowler. She has been prescribed Incruse, but is awaiting insurance authorization for this.  Nicole Mendoza has a history of OSA. She notes she has been having more difficulty awakening in the morning. She notes her husband is having to wake her up several times in the morning before she finally gets up. She does admit to significant lack of energy during the day, but is not having daytime somnolence (compared to prior to CPAP use). She typically gets about 7 hours of sleep a night. She did have her CPAP retitrated, with small adjustments to her pressure, but he sleep lab felt this was working well overall and that her adherence was good.  Nicole Mendoza has a history of GERD. She notes her GI doctor switched her to [place] and this seems to be working better for her.  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   Recurrent sinusitis 11/09/2020   Pre-nodular edema of the vocal folds 11/09/2020   Laryngospasms 11/09/2020   ASCUS of cervix with negative high risk HPV 07/10/2020   Palpitations 09/02/2019   Cardiac murmur 09/02/2019   History of COVID-19 09/01/2019   Tachycardia 09/01/2019   Salivary stone 09/01/2019   Anaphylactic shock due to adverse food reaction 01/22/2019   Seasonal and perennial allergic rhinitis 01/22/2019   Morbid obesity with BMI of 40.0-44.9, adult ([place]) 10/28/2017   Moderate persistent asthma without complication 0000000   Essential hypertension 04/22/2017   Dysmenorrhea 06/26/2016   Menorrhagia with regular cycle 06/26/2016   Gastroesophageal reflux disease 12/22/2015   Iron deficiency anemia 11/02/2014   Vitamin D deficiency 11/02/2014   Past Surgical History:  Procedure Laterality Date   NO PAST SURGERIES     Family History  Problem Relation Age of Onset   Allergic rhinitis Father    Asthma Father    Allergic rhinitis Sister    Asthma Sister    Angioedema Neg Hx    Atopy Neg Hx    Eczema Neg Hx    Immunodeficiency Neg Hx    Urticaria Neg Hx    Outpatient Medications Prior to Visit  Medication Sig Dispense Refill   Ascorbic Acid (SUPER C COMPLEX PO)  ASMANEX, 120 METERED DOSES, 220 MCG/INH inhaler TAKE 2 PUFFS BY MOUTH TWICE A DAY 1 each 3   azithromycin (ZITHROMAX) 500 MG tablet Take by mouth. 3 times a week     budesonide (PULMICORT) 0.5 MG/2ML nebulizer solution Take 0.5 mg by nebulization as needed (shortness of breath and wheezing).     cetirizine (ZYRTEC) 10 MG tablet Take by mouth.     dexlansoprazole (DEXILANT) 60 MG capsule Take 1 capsule (60 mg total) by mouth daily. 30 capsule 5   EPINEPHrine (AUVI-Q) 0.3 mg/0.3 mL IJ SOAJ injection  Use as directed for severe allergic reactions 4 each 1   famotidine (PEPCID) 20 MG tablet TAKE 1 TABLET BY MOUTH TWICE A DAY 60 tablet 3   ferrous sulfate 325 (65 FE) MG tablet 3 (three) times a week.      Fluticasone Propionate (XHANCE) 93 MCG/ACT EXHU Place 2 sprays into both nostrils 2 (two) times daily. 32 mL 5   hydrOXYzine (ATARAX/VISTARIL) 25 MG tablet Take 1 tablet (25 mg total) by mouth 3 (three) times daily as needed. 30 tablet 1   levalbuterol (XOPENEX) 1.25 MG/3ML nebulizer solution USE 1 VIAL BY NEBULIZATION EVERY 6 (SIX) HOURS AS NEEDED FOR WHEEZING. 90 mL 1   metoprolol succinate (TOPROL-XL) 100 MG 24 hr tablet Take 1 tablet (100 mg total) by mouth daily. Take with or immediately following a meal. 90 tablet 3   montelukast (SINGULAIR) 10 MG tablet TAKE 1 TABLET BY MOUTH EVERY DAY 90 tablet 1   norethindrone (MICRONOR) 0.35 MG tablet Take 0.35 mg by mouth.     ondansetron (ZOFRAN) 4 MG tablet Take 1 tablet (4 mg total) by mouth every 8 (eight) hours as needed for nausea or vomiting. 20 tablet 0   predniSONE (DELTASONE) 10 MG tablet Take one tablet twice daily for six days. 12 tablet 0   Prenatal Vit-Fe Fumarate-FA (MULTIVITAMIN-PRENATAL) 27-0.8 MG TABS tablet Take 1 tablet by mouth daily at 12 noon.     Probiotic Product (PHILLIPS COLON HEALTH) CAPS      levalbuterol (XOPENEX HFA) 45 MCG/ACT inhaler Inhale 2 puffs into the lungs every 6 (six) hours as needed for wheezing. 1 each 2   sucralfate (CARAFATE) 1 g tablet Take 1 tablet (1 g total) by mouth 4 (four) times daily -  with meals and at bedtime. 120 tablet 3   umeclidinium bromide (INCRUSE ELLIPTA) 62.5 MCG/INH AEPB Inhale 1 puff into the lungs daily. (Patient not taking: Reported on 02/26/2021) 30 each 5   SPIRIVA RESPIMAT 1.25 MCG/ACT AERS  (Patient not taking: Reported on 02/26/2021)     No facility-administered medications prior to visit.   Allergies  Allergen Reactions   Budesonide-Formoterol Fumarate Other (See Comments)     Causes Hypertension   Penicillins Hives   Raspberry Hives   Spiriva Respimat [Tiotropium Bromide Monohydrate] Other (See Comments)    Pt states medication causes dizziness   Albuterol Palpitations   Losartan Rash    Objective:   Today's Vitals   02/26/21 1031  BP: 138/84  Pulse: 84  Temp: (!) 97.2 F (36.2 C)  TempSrc: Temporal  SpO2: 99%  Weight: 280 lb 8 oz (127.2 kg)   Body mass index is 42.65 kg/m.   General: Well developed, well nourished. No acute distress. Lungs: Clear to auscultation bilaterally. No wheezing, rales or rhonchi. Psych: Alert and oriented. Normal mood and affect.  Health Maintenance Due  Topic Date Due   Hepatitis C Screening  Never done   Pneumococcal  Vaccine 38-25 Years old (2 - PCV) 05/13/2019   COVID-19 Vaccine (3 - Pfizer risk series) 04/21/2020   INFLUENZA VACCINE  01/15/2021     Assessment & Plan:   1. Essential hypertension Blood pressure remains adequately controlled. We will continue metoprolol.  2. Moderate persistent asthma without complication Reviewed notes form Dr. Ernst Bowler. Stable on Xopenox (inhaler and nebs), Asmanex and Singulair. She will hopefully start on Incruse soon.  3. Obstructive sleep apnea Reviewed notes from Dr. Brett Fairy. The difficulty with awakening in the morning is not clearly due to ineffective management of OSA with CPAP. I recommended she try going to bed 30-60 min earlier to see if she is needing more sleep than she has been getting.  [name], MD

## 2021-03-15 ENCOUNTER — Encounter: Payer: Self-pay | Admitting: Allergy & Immunology

## 2021-03-16 ENCOUNTER — Ambulatory Visit (INDEPENDENT_AMBULATORY_CARE_PROVIDER_SITE_OTHER): Payer: 59 | Admitting: Allergy & Immunology

## 2021-03-16 ENCOUNTER — Encounter: Payer: 59 | Admitting: Family Medicine

## 2021-03-16 ENCOUNTER — Encounter: Payer: Self-pay | Admitting: Allergy & Immunology

## 2021-03-16 ENCOUNTER — Other Ambulatory Visit: Payer: Self-pay

## 2021-03-16 VITALS — BP 152/92 | HR 85 | Temp 99.5°F | Resp 18 | Ht 68.0 in | Wt 278.2 lb

## 2021-03-16 DIAGNOSIS — T7800XD Anaphylactic reaction due to unspecified food, subsequent encounter: Secondary | ICD-10-CM

## 2021-03-16 NOTE — Patient Instructions (Addendum)
Peanut allergy - You passed  the peanut butter challenge. - Continue to monitor for signs/symptoms of anaphylaxis. - You have my digits. - EAT THAT PB&J tomorrow and get a SELFIE!   2. Return in about 4 weeks (around 04/13/2021) for a MIXED TREE NUT BUTTER CHALLENGE!    Please inform us of any Emergency Department visits, hospitalizations, or changes in symptoms. Call us before going to the ED for breathing or allergy symptoms since we might be able to fit you in for a sick visit. Feel free to contact us anytime with any questions, problems, or concerns.  It was a pleasure to see you again as always today!  Websites that have reliable patient information: 1. American Academy of Asthma, Allergy, and Immunology: www.aaaai.org 2. Food Allergy Research and Education (FARE): foodallergy.org 3. Mothers of Asthmatics: http://www.asthmacommunitynetwork.org 4. American College of Allergy, Asthma, and Immunology: www.acaai.org   COVID-19 Vaccine Information can be found at: ShippingScam.co.uk For questions related to vaccine distribution or appointments, please email [email or web address] or call [phone].   We realize that you might be concerned about having an allergic reaction to the COVID19 vaccines. To help with that concern, WE ARE OFFERING THE COVID19 VACCINES IN OUR OFFICE! Ask the front desk for dates!     "Like" [place] on Facebook and Instagram for our latest updates!      A healthy democracy works best when [employer] participate! Make sure you are registered to vote! If you have moved or changed any of your contact information, you will need to get this updated before voting!  In some cases, you MAY be able to register to vote online: [email or web address]

## 2021-03-16 NOTE — Progress Notes (Signed)
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 - Continue to monitor for signs/symptoms of anaphylaxis. - You have my digits. - EAT THAT PB&J tomorrow and get a SELFIE!   2. Return in about 4 weeks (around 04/13/2021) for a MIXED TREE NUT BUTTER CHALLENGE!    Subjective:   Nicole Mendoza is a 30 y.o. 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   Recurrent sinusitis 11/09/2020   Pre-nodular edema of the vocal folds 11/09/2020   Laryngospasms 11/09/2020   ASCUS of cervix with negative high risk HPV 07/10/2020   Palpitations 09/02/2019   Cardiac murmur 09/02/2019   History of COVID-19 09/01/2019   Tachycardia 09/01/2019   Salivary stone 09/01/2019   Anaphylactic shock due to adverse food reaction 01/22/2019   Seasonal and perennial allergic rhinitis 01/22/2019   Morbid obesity with BMI of 40.0-44.9, adult ([place]) 10/28/2017   Moderate persistent asthma without complication 53/66/4403   Essential hypertension 04/22/2017   Dysmenorrhea 06/26/2016   Menorrhagia with regular cycle 06/26/2016   Gastroesophageal reflux disease 12/22/2015   Iron deficiency anemia 11/02/2014   Vitamin D deficiency 11/02/2014    History obtained from: chart review and patient.  Nicole Mendoza is a 30  y.o. female presenting for a food challenge.  She has a number medical issues, but she is here for a food challenge.  She has had skin testing has been negative as well as blood work that has been negative.  In order to get her off her antihistamines, we did give her a low-dose prednisone burst.  She has milligrams 1-2 times daily for a few days now.  Otherwise, she is compliant.  She has had some breakouts on her face and appear urticarial in pictures.  No new food exposures, but she thinks it might be some of her rosacea coming back.  Her POTS has been under fair control.  Otherwise, there have been no changes to her past medical history, surgical history, family history, or social history.    Review of Systems  Constitutional: Negative.  Negative for fever, malaise/fatigue and weight loss.  HENT: Negative.  Negative for congestion, ear discharge and ear pain.   Eyes:  Negative for pain, discharge and redness.  Respiratory:  Negative for cough, sputum production, shortness of breath and wheezing.   Cardiovascular: Negative.  Negative for chest pain and palpitations.  Gastrointestinal:  Negative for abdominal pain, constipation, diarrhea, heartburn, nausea and vomiting.  Skin: Negative.  Negative for itching and rash.  Neurological:  Negative for dizziness and headaches.  Endo/Heme/Allergies:  Negative for environmental allergies. Does not bruise/bleed easily.  Psychiatric/Behavioral:  The patient is nervous/anxious.       Objective:   Blood pressure (!) 152/92, pulse 85, temperature 99.5 F (37.5 C), temperature source Temporal, resp. rate 18, height  5\' 8"  (1.727 m), weight 278 lb 3.2 oz (126.2 kg), SpO2 99 %. Body mass index is 42.3 kg/m.   Physical Exam: deferred since this was a challenge appointment only   Diagnostic studies:   Open graded peanut butter oral challenge: The patient was able to tolerate the challenge today without adverse signs or symptoms. Vital signs were  stable throughout the challenge and observation period. She received multiple doses separated by 15 minutes, each of which was separated by vitals and a brief physical exam. She received the following doses: lip rub, 1 gm, 2 gm, 4 gm, 8 gm, and 16 gm. She was monitored for 60 minutes following the last dose.   The patient had negative skin prick test and sIgE tests to peanut  and was able to tolerate the open graded oral challenge today without adverse signs or symptoms. Therefore, she has the same risk of systemic reaction associated with the consumption of peanut as the general population.   Allergy Studies:    Oral Challenge - 03/16/21 1200     Challenge Food/Drug Peanut Butter    Food/Drug provided by Practice    BP 152/92    Pulse 85    Respirations 18    Lungs clear    Skin clear    Mouth clear    Time 1207    Dose lip rub    Time 1208    Dose 1 gram    Time 1208    Dose 2 grams    BP 132/90    Pulse 88    Respirations 18    Lungs clear    Skin clear    Mouth clear    Time 1210    Dose 4 grams    BP 130/82    Pulse 85    Respirations 18    Lungs clear    Skin clear    Mouth clear    Time 1210    Dose 8 grams    BP 130/88    Pulse 86    Respirations 18    Lungs clear    Skin clear    Mouth clear    Time 1211    Dose 16 grams    BP 128/80    Pulse 88    Respirations 18    Lungs clear    Skin clear    Mouth clear                 Salvatore Marvel, MD  [facility]

## 2021-03-27 ENCOUNTER — Other Ambulatory Visit: Payer: Self-pay

## 2021-03-27 MED ORDER — XHANCE 93 MCG/ACT NA EXHU: 2.0000 | INHALANT_SUSPENSION | Freq: Two times a day (BID) | NASAL | 5 refills | Status: DC

## 2021-03-29 ENCOUNTER — Other Ambulatory Visit: Payer: Self-pay | Admitting: Allergy & Immunology

## 2021-04-13 ENCOUNTER — Encounter: Payer: 59 | Admitting: Allergy & Immunology

## 2021-05-07 ENCOUNTER — Telehealth: Payer: Self-pay | Admitting: *Deleted

## 2021-05-07 NOTE — Telephone Encounter (Signed)
PA has been submitted through CoverMyMeds for Dexlansoprazole and is currently pending approval/denial.

## 2021-05-08 NOTE — Telephone Encounter (Signed)
Received a notification that no PA is required for Dexlansoprazole for the patient.

## 2021-05-09 ENCOUNTER — Ambulatory Visit (INDEPENDENT_AMBULATORY_CARE_PROVIDER_SITE_OTHER): Payer: 59 | Admitting: Nurse Practitioner

## 2021-05-09 ENCOUNTER — Other Ambulatory Visit: Payer: Self-pay

## 2021-05-09 ENCOUNTER — Encounter: Payer: Self-pay | Admitting: Nurse Practitioner

## 2021-05-09 VITALS — BP 122/76 | HR 92 | Temp 97.8°F | Wt 287.8 lb

## 2021-05-09 DIAGNOSIS — R8290 Unspecified abnormal findings in urine: Secondary | ICD-10-CM | POA: Diagnosis not present

## 2021-05-09 DIAGNOSIS — R5383 Other fatigue: Secondary | ICD-10-CM | POA: Diagnosis not present

## 2021-05-09 DIAGNOSIS — R5381 Other malaise: Secondary | ICD-10-CM

## 2021-05-09 LAB — POCT URINALYSIS DIPSTICK
Bilirubin, UA: NEGATIVE
Blood, UA: NEGATIVE
Glucose, UA: NEGATIVE
Ketones, UA: NEGATIVE
Nitrite, UA: NEGATIVE
Protein, UA: NEGATIVE
Spec Grav, UA: <=1.005 — AB (ref 1.010–1.025)
Urobilinogen, UA: 0.2 E.U./dL
pH, UA: 7.5 (ref 5.0–8.0)

## 2021-05-09 LAB — POCT INFLUENZA A/B
Influenza A, POC: NEGATIVE
Influenza B, POC: NEGATIVE

## 2021-05-09 NOTE — Patient Instructions (Addendum)
Normal urinalysis, sent for culture Go to lab for blood draw Negative flu test.  Use tylenol or ibuprofen prn for malaise.

## 2021-05-09 NOTE — Progress Notes (Signed)
Subjective:  Patient ID: Nicole Mendoza, female    DOB: 01/24/1991  Age: 30 y.o. MRN: [id number]  CC: Acute Visit (Pt c/o extreme fatigue, ear ache, and muscle pain x 5-6 days. /Pt states her Cardiologist informed her she may have POTS but states she has not been seen for this. )  HPI Accompanied by her husband: She presents with Generalized fatigue, chills, and malaise x 1week, worse this morning. Negative home COVID test. Did not get influenza vaccine for this season. No known sick contact. No travel. No upper respiratory symptoms, no GI/GU symptoms, no rash.  Use of micronor for contraception.  Reviewed past Medical, Social and Family history today.  Outpatient Medications Prior to Visit  Medication Sig Dispense Refill   Ascorbic Acid (SUPER C COMPLEX PO)      ASMANEX, 120 METERED DOSES, 220 MCG/INH inhaler TAKE 2 PUFFS BY MOUTH TWICE A DAY 1 each 4   azithromycin (ZITHROMAX) 500 MG tablet Take by mouth. 3 times a week     budesonide (PULMICORT) 0.5 MG/2ML nebulizer solution Take 0.5 mg by nebulization as needed (shortness of breath and wheezing).     cetirizine (ZYRTEC) 10 MG tablet Take by mouth.     dexlansoprazole (DEXILANT) 60 MG capsule Take 1 capsule (60 mg total) by mouth daily. 30 capsule 5   EPINEPHrine (AUVI-Q) 0.3 mg/0.3 mL IJ SOAJ injection Use as directed for severe allergic reactions 4 each 1   famotidine (PEPCID) 20 MG tablet TAKE 1 TABLET BY MOUTH TWICE A DAY 60 tablet 3   ferrous sulfate 325 (65 FE) MG tablet 3 (three) times a week.      Fluticasone Propionate (XHANCE) 93 MCG/ACT EXHU Place 2 sprays into both nostrils 2 (two) times daily. 32 mL 5   hydrOXYzine (ATARAX/VISTARIL) 25 MG tablet Take 1 tablet (25 mg total) by mouth 3 (three) times daily as needed. 30 tablet 1   levalbuterol (XOPENEX) 1.25 MG/3ML nebulizer solution USE 1 VIAL BY NEBULIZATION EVERY 6 (SIX) HOURS AS NEEDED FOR WHEEZING. 90 mL 1   metoprolol succinate (TOPROL-XL) 100 MG 24 hr tablet Take 1  tablet (100 mg total) by mouth daily. Take with or immediately following a meal. 90 tablet 3   montelukast (SINGULAIR) 10 MG tablet TAKE 1 TABLET BY MOUTH EVERY DAY 90 tablet 1   norethindrone (MICRONOR) 0.35 MG tablet Take 0.35 mg by mouth.     ondansetron (ZOFRAN) 4 MG tablet Take 1 tablet (4 mg total) by mouth every 8 (eight) hours as needed for nausea or vomiting. 20 tablet 0   Prenatal Vit-Fe Fumarate-FA (MULTIVITAMIN-PRENATAL) 27-0.8 MG TABS tablet Take 1 tablet by mouth daily at 12 noon.     Probiotic Product (PHILLIPS COLON HEALTH) CAPS      levalbuterol (XOPENEX HFA) 45 MCG/ACT inhaler Inhale 2 puffs into the lungs every 6 (six) hours as needed for wheezing. 1 each 2   sucralfate (CARAFATE) 1 g tablet Take 1 tablet (1 g total) by mouth 4 (four) times daily -  with meals and at bedtime. 120 tablet 3   predniSONE (DELTASONE) 10 MG tablet Take one tablet twice daily for six days. (Patient not taking: Reported on 05/09/2021) 12 tablet 0   No facility-administered medications prior to visit.    ROS See HPI  Objective:  BP 122/76 (BP Location: Right Arm, Patient Position: Sitting, Cuff Size: Large)   Pulse 92   Temp 97.8 F (36.6 C) (Temporal)   Wt 287 lb 12.8 oz (130.5 kg)  SpO2 98%   BMI 43.76 kg/m   Physical Exam Vitals reviewed.  HENT:     Right Ear: Tympanic membrane, ear canal and external ear normal.     Left Ear: Tympanic membrane, ear canal and external ear normal.  Cardiovascular:     Rate and Rhythm: Normal rate and regular rhythm.     Pulses: Normal pulses.     Heart sounds: Normal heart sounds.  Pulmonary:     Effort: Pulmonary effort is normal.     Breath sounds: Normal breath sounds.  Abdominal:     General: Bowel sounds are normal.     Palpations: Abdomen is soft.     Tenderness: There is no abdominal tenderness.  Musculoskeletal:     Right lower leg: No edema.     Left lower leg: No edema.  Neurological:     Mental Status: She is alert and oriented  to person, place, and time.  Psychiatric:        Mood and Affect: Mood normal.        Behavior: Behavior normal.        Thought Content: Thought content normal.   Assessment & Plan:  This visit occurred during the SARS-CoV-2 public health emergency.  Safety protocols were in place, including screening questions prior to the visit, additional usage of staff PPE, and extensive cleaning of exam room while observing appropriate contact time as indicated for disinfecting solutions.   Kaysen was seen today for acute visit.  Diagnoses and all orders for this visit:  Malaise and fatigue -     POCT urinalysis dipstick -     Cancel: Basic metabolic panel -     Cancel: CBC -     Basic metabolic panel -     CBC  Abnormal finding on urinalysis -     Urine Culture No obvious infection per urinalysis, sent for culture Go to lab for blood draw Negative flu test today Use tylenol or ibuprofen prn for malaise.  Problem List Items Addressed This Visit   None Visit Diagnoses     Malaise and fatigue    -  Primary   Relevant Orders   POCT urinalysis dipstick (Completed)   Basic metabolic panel   CBC   Abnormal finding on urinalysis       Relevant Orders   Urine Culture       Follow-up: Return if symptoms worsen or fail to improve.  Wilfred Lacy, NP

## 2021-05-10 LAB — CBC
HCT: 39.5 % (ref 35.0–45.0)
Hemoglobin: 13.4 g/dL (ref 11.7–15.5)
MCH: 28.6 pg (ref 27.0–33.0)
MCHC: 33.9 g/dL (ref 32.0–36.0)
MCV: 84.2 fL (ref 80.0–100.0)
MPV: 11.2 fL (ref 7.5–12.5)
Platelets: 299 10*3/uL (ref 140–400)
RBC: 4.69 10*6/uL (ref 3.80–5.10)
RDW: 13.1 % (ref 11.0–15.0)
WBC: 8.6 10*3/uL (ref 3.8–10.8)

## 2021-05-10 LAB — BASIC METABOLIC PANEL
BUN: 11 mg/dL (ref 7–25)
CO2: 26 mmol/L (ref 20–32)
Calcium: 9.5 mg/dL (ref 8.6–10.2)
Chloride: 105 mmol/L (ref 98–110)
Creat: 0.69 mg/dL (ref 0.50–0.97)
Glucose, Bld: 95 mg/dL (ref 65–99)
Potassium: 4.4 mmol/L (ref 3.5–5.3)
Sodium: 140 mmol/L (ref 135–146)

## 2021-05-11 ENCOUNTER — Other Ambulatory Visit: Payer: Self-pay | Admitting: Allergy & Immunology

## 2021-05-11 LAB — URINE CULTURE
MICRO NUMBER:: 12675669
SPECIMEN QUALITY:: ADEQUATE

## 2021-05-28 ENCOUNTER — Other Ambulatory Visit: Payer: Self-pay

## 2021-05-28 ENCOUNTER — Other Ambulatory Visit: Payer: Self-pay | Admitting: Allergy & Immunology

## 2021-05-28 ENCOUNTER — Ambulatory Visit: Payer: 59 | Admitting: Family Medicine

## 2021-05-28 ENCOUNTER — Telehealth: Payer: Self-pay | Admitting: *Deleted

## 2021-05-28 MED ORDER — LEVALBUTEROL TARTRATE 45 MCG/ACT IN AERO: INHALATION_SPRAY | RESPIRATORY_TRACT | 1 refills | Status: DC

## 2021-05-28 MED ORDER — ASMANEX (120 METERED DOSES) 220 MCG/ACT IN AEPB: 2.0000 | INHALATION_SPRAY | Freq: Two times a day (BID) | RESPIRATORY_TRACT | 4 refills | Status: DC

## 2021-05-28 NOTE — Telephone Encounter (Signed)
PA has been submitted through CoverMyMeds for Asmanex and is currently pending approval/denial.

## 2021-05-29 ENCOUNTER — Other Ambulatory Visit: Payer: Self-pay

## 2021-05-29 MED ORDER — DEXLANSOPRAZOLE 60 MG PO CPDR: 60.0000 mg | DELAYED_RELEASE_CAPSULE | Freq: Every day | ORAL | 5 refills | Status: DC

## 2021-05-29 NOTE — Telephone Encounter (Signed)
PA has been approved for Asmanex. PA has been faxed to patients pharmacy, labeled, and placed in bulk scanning.

## 2021-05-31 ENCOUNTER — Other Ambulatory Visit: Payer: Self-pay

## 2021-05-31 ENCOUNTER — Ambulatory Visit (INDEPENDENT_AMBULATORY_CARE_PROVIDER_SITE_OTHER): Payer: 59 | Admitting: Allergy & Immunology

## 2021-05-31 ENCOUNTER — Encounter: Payer: Self-pay | Admitting: Allergy & Immunology

## 2021-05-31 ENCOUNTER — Telehealth: Payer: Self-pay | Admitting: Allergy & Immunology

## 2021-05-31 VITALS — BP 126/80 | HR 93 | Temp 98.0°F | Resp 16 | Ht 68.0 in | Wt 291.6 lb

## 2021-05-31 DIAGNOSIS — D808 Other immunodeficiencies with predominantly antibody defects: Secondary | ICD-10-CM | POA: Diagnosis not present

## 2021-05-31 DIAGNOSIS — K219 Gastro-esophageal reflux disease without esophagitis: Secondary | ICD-10-CM | POA: Diagnosis not present

## 2021-05-31 DIAGNOSIS — J3089 Other allergic rhinitis: Secondary | ICD-10-CM

## 2021-05-31 DIAGNOSIS — J4540 Moderate persistent asthma, uncomplicated: Secondary | ICD-10-CM

## 2021-05-31 DIAGNOSIS — J302 Other seasonal allergic rhinitis: Secondary | ICD-10-CM

## 2021-05-31 DIAGNOSIS — T7800XD Anaphylactic reaction due to unspecified food, subsequent encounter: Secondary | ICD-10-CM

## 2021-05-31 MED ORDER — PREDNISONE 10 MG PO TABS: 10.0000 mg | ORAL_TABLET | Freq: Two times a day (BID) | ORAL | 0 refills | Status: AC

## 2021-05-31 NOTE — Telephone Encounter (Signed)
Nicole Mendoza called back after she left and stated that she forgot to mention that when she goes off her antihistamines for 3 days prior to her nut challenge, she will need prednisone again.  Jmya would like for you to go ahead and call it in so she has it ready in January for the nut challenge.  [facility] in [place].

## 2021-05-31 NOTE — Telephone Encounter (Signed)
Called and informed patient. Patient verbalized understanding.  

## 2021-05-31 NOTE — Patient Instructions (Addendum)
Asthma - Continue montelukast 10 mg once a day to prevent cough or wheeze - Continue Asthmanex 220- 2 puffs twice a day to prevent cough or wheeze - Add Incruse one puff once daily (samples will be at [facility]). - Continue levalbuterol 2 puffs every 4 hours as needed for cough or wheeze OR Instead use levalbuterol solution via nebulizer one unit vial every 4-6 hours as needed for cough or wheeze - For asthma flares, continue Pulmicort 0.5 mg twice a day via nebulizer for 2 weeks or until cough and wheeze free.  Allergic rhinitis - Continue saline rinses several times a day - Continue cetirizine 10 mg once a day as needed for a runny nose or itch. - Continue Xhance 2 sprays in each nostril twice a day for nasal symptoms  Recurrent infections - START YOUR HIZENTRA (we will plan for May 2023) - Continue to monitor frequency of infections.   Reflux - Continue dietary and lifestyle modifications as listed below - PLEASE CONTINUE Dexilant 60 mg once a day to prevent reflux.  - Continue famotidine 20 mg twice a day to prevent reflux  Vocal cord dysfunction - Continue with speech therapy as you have been  Food allergy - Continue to avoid tree nuts.  In case of an allergic reaction, take Benadryl 50 mg every 4 hours, and if life-threatening symptoms occur, inject with AuviQ 0.3 mg. - Make an appointment for a mixed tree nut challenge.   Follow up for a mixed tree nut butter challenge.   Please inform us of any Emergency Department visits, hospitalizations, or changes in symptoms. Call us before going to the ED for breathing or allergy symptoms since we might be able to fit you in for a sick visit. Feel free to contact us anytime with any questions, problems, or concerns.  It was a pleasure to see you again today!  Websites that have reliable patient information: 1. American Academy of Asthma, Allergy, and Immunology: www.aaaai.org 2. Food Allergy Research and Education (FARE):  foodallergy.org 3. Mothers of Asthmatics: http://www.asthmacommunitynetwork.org 4. American College of Allergy, Asthma, and Immunology: www.acaai.org   COVID-19 Vaccine Information can be found at: ShippingScam.co.uk For questions related to vaccine distribution or appointments, please email vaccine@[email or web address] .[email or web address] or call [phone].   We realize that you might be concerned about having an allergic reaction to the COVID19 vaccines. To help with that concern, WE ARE OFFERING THE COVID19 VACCINES IN OUR OFFICE! Ask the front desk for dates!     Like [place] on [place] and Instagram for our latest updates!      A healthy democracy works best when [employer] participate! Make sure you are registered to vote! If you have moved or changed any of your contact information, you will need to get this updated before voting!  In some cases, you MAY be able to register to vote online: [email or web address]

## 2021-05-31 NOTE — Progress Notes (Signed)
FOLLOW UP  Date of Service/Encounter:  05/31/21   Assessment:   Mild persistent asthma - now back to being well-controlled on Asmanex BID but complicated by vocal cord dysfunction     Recurrent infections - improved following Pneumovax, but then worsened (streptococcal avidity assay abnormal), will be starting immunoglobulin therapy soon at 400 mg/kg/month equivalent   Seasonal and perennial allergic rhinitis (trees, weeds, grasses and dust mites) - not well controlled at all   Gastroesophageal reflux disease - on Dexilant (recently started) and famotidine and PRN carafate    Adverse food reactions - resolved with minimizing exposure to food additives and avoiding peanuts/tree nuts   Snoring with perceived poor sleep quality - improved with use of the CPAP   Possible POTS syndrome   Plan/Recommendations:   Asthma - Continue montelukast 10 mg once a day to prevent cough or wheeze - Continue Asthmanex 220- 2 puffs twice a day to prevent cough or wheeze - Add Incruse one puff once daily (samples will be at [facility]). - Continue levalbuterol 2 puffs every 4 hours as needed for cough or wheeze OR Instead use levalbuterol solution via nebulizer one unit vial every 4-6 hours as needed for cough or wheeze - For asthma flares, continue Pulmicort 0.5 mg twice a day via nebulizer for 2 weeks or until cough and wheeze free.  Allergic rhinitis - Continue saline rinses several times a day - Continue cetirizine 10 mg once a day as needed for a runny nose or itch. - Continue Xhance 2 sprays in each nostril twice a day for nasal symptoms  Recurrent infections - START YOUR HIZENTRA (we will plan for May 2023) - Continue to monitor frequency of infections.   Reflux - Continue dietary and lifestyle modifications as listed below - PLEASE CONTINUE Dexilant 60 mg once a day to prevent reflux.  - Continue famotidine 20 mg twice a day to prevent reflux  Vocal cord dysfunction - Continue  with speech therapy as you have been  Food allergy - Continue to avoid tree nuts.  In case of an allergic reaction, take Benadryl 50 mg every 4 hours, and if life-threatening symptoms occur, inject with AuviQ 0.3 mg. - Make an appointment for a mixed tree nut challenge.   Follow up for a mixed tree nut butter challenge.   Subjective:   Nicole Mendoza is a 30 y.o. female presenting today for follow up of  Chief Complaint  Patient presents with   Follow-up    Patient states she has been doing well since last visit. States has had shortness of breathe a couple times, but used inhaler and it did not help, so she thinks it was due to something else.    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   Recurrent sinusitis 11/09/2020   Pre-nodular edema of the vocal folds 11/09/2020   Laryngospasms 11/09/2020   ASCUS of cervix with negative high risk HPV 07/10/2020   Palpitations 09/02/2019   Cardiac murmur 09/02/2019   History of COVID-19 09/01/2019   Tachycardia 09/01/2019   Salivary stone 09/01/2019  Anaphylactic shock due to adverse food reaction 01/22/2019   Seasonal and perennial allergic rhinitis 01/22/2019   Morbid obesity with BMI of 40.0-44.9, adult ([place]) 10/28/2017   Moderate persistent asthma without complication 16/03/9603   Essential hypertension 04/22/2017   Dysmenorrhea 06/26/2016   Menorrhagia with regular cycle 06/26/2016   Gastroesophageal reflux disease 12/22/2015   Iron deficiency anemia 11/02/2014   Vitamin D deficiency 11/02/2014     History obtained from: chart review and patient.  Nicole Mendoza is a 30 y.o. female presenting for a follow up visit. She wa last seen in September 2022. At that time, we continued with montelukast and Asmanex. We added in Incruse one puff once daily to help. We continued with Xopenex two puffs every 4 hours as needed. For her allergic rhinitis, we continued with cetirizine as well as Xhance. She does have a history of specific antibody deficiency but she has yet to start her immunoglobulin therapy.   Since the last visit, she has mostly done well. They changed to home schooling because she was told that DJ was not prepared for kindergarten. So they tried to do it at home. She does 45 minutes in the morning and then he does 45 minutes with her maternal grandmother.  So since he has not been going to "germy areas", he has actually done well and has not been sick. Because of this, she has not had issues with being sick. She has finally come to the realization that she likely needs immunoglobulin. She is going to start this finally in May 2023. She thinks that this will be a better time of the year for her to start.   Asthma/Respiratory Symptom History: She remains on Asmanex routinely. She is also on the montelukast. Spiriva has made her dizzy in the past. She never got the Incruse approved. She has not been using her rescue inhaler too much. She does use it to rule out whether it is vocal cord dysfunction or POTS. She has been able to do some breathing exercises when she feels the sensation of not able to breathe.   Allergic Rhinitis Symptom History: She remains on the [place] as well as the cetirizine. She does fairly well with this. She has not needed any prednisone since the last visit at all.    Food Allergy Symptom History: She did eat a PB&J. She loves it. She has been eating peanut butter a lot since that time. She remains open in the doing a mix tree nut butter challenge. She will need some low dose  prednisone for a few days before this since we want her off of the antihistamines.    GERD Symptom History: She remains on the Dexilant once daily. She also remains on the famotidine 20mg  twice daily. She has not needed carafate in quite some time.   Otherwise, there have been no changes to her past medical history, surgical history, family history, or social history.    Review of Systems  Constitutional: Negative.  Negative for chills, fever, malaise/fatigue and weight loss.  HENT: Negative.  Negative for congestion, ear discharge, ear pain and sinus pain.   Eyes:  Negative for pain, discharge and redness.  Respiratory:  Negative for cough, sputum production, shortness of breath and wheezing.   Cardiovascular: Negative.  Negative for chest pain and palpitations.  Gastrointestinal:  Negative for abdominal pain, constipation, diarrhea, heartburn, nausea and vomiting.  Skin: Negative.  Negative for itching and rash.  Neurological:  Negative for dizziness and headaches.  Endo/Heme/Allergies:  Negative for environmental allergies. Does not bruise/bleed easily.      Objective:   Blood pressure 126/80, pulse 93, temperature 98 F (36.7 C), temperature source Temporal, resp. rate 16, height 5\' 8"  (1.727 m), weight 291 lb 9.6 oz (132.3 kg), SpO2 100 %. Body mass index is 44.34 kg/m.   Physical Exam:  Physical Exam Vitals reviewed.  Constitutional:      Appearance: She is well-developed. She is obese.     Comments: Talkative as always. Does take some breaths between diatribes for breathing.   HENT:     Head: Normocephalic and atraumatic.     Right Ear: Tympanic membrane, ear canal and external ear normal.     Left Ear: Tympanic membrane, ear canal and external ear normal.     Nose: No nasal deformity, septal deviation, mucosal edema or rhinorrhea.     Right Turbinates: Enlarged, swollen and pale.     Left Turbinates: Enlarged, swollen and pale.     Right Sinus: No maxillary sinus  tenderness or frontal sinus tenderness.     Left Sinus: No maxillary sinus tenderness or frontal sinus tenderness.     Mouth/Throat:     Mouth: Mucous membranes are not pale and not dry.     Pharynx: Uvula midline. No pharyngeal swelling or oropharyngeal exudate.     Tonsils: 2+ on the right. 2+ on the left.  Eyes:     General: Lids are normal. No allergic shiner.       Right eye: No discharge.        Left eye: No discharge.     Conjunctiva/sclera: Conjunctivae normal.     Right eye: Right conjunctiva is not injected. No chemosis.    Left eye: Left conjunctiva is not injected. No chemosis.    Pupils: Pupils are equal, round, and reactive to light.  Cardiovascular:     Rate and Rhythm: Normal rate and regular rhythm.     Heart sounds: Normal heart sounds.  Pulmonary:     Effort: Pulmonary effort is normal. No tachypnea, accessory muscle usage or respiratory distress.     Breath sounds: Normal breath sounds. No wheezing, rhonchi or rales.     Comments: Moving air well in all lung fields.  No increased work of breathing. Chest:     Chest wall: No tenderness.  Lymphadenopathy:     Cervical: No cervical adenopathy.  Skin:    General: Skin is warm.     Capillary Refill: Capillary refill takes less than 2 seconds.     Coloration: Skin is not pale.     Findings: No abrasion, erythema, petechiae or rash. Rash is not papular, urticarial or vesicular.     Comments: No eczematous or urticarial lesions noted.  Neurological:     Mental Status: She is alert.  Psychiatric:        Behavior: Behavior is cooperative.     Diagnostic studies: none      Salvatore Marvel, MD  [facility]

## 2021-05-31 NOTE — Telephone Encounter (Signed)
Sent in script.   Salvatore Marvel, MD [facility]

## 2021-06-04 ENCOUNTER — Encounter: Payer: Self-pay | Admitting: Allergy & Immunology

## 2021-06-18 ENCOUNTER — Other Ambulatory Visit: Payer: Self-pay | Admitting: Allergy & Immunology

## 2021-06-18 NOTE — Telephone Encounter (Signed)
Please advise to refill as it is no stated on last avs

## 2021-06-22 ENCOUNTER — Other Ambulatory Visit: Payer: Self-pay

## 2021-06-22 ENCOUNTER — Ambulatory Visit (INDEPENDENT_AMBULATORY_CARE_PROVIDER_SITE_OTHER): Payer: 59 | Admitting: Family Medicine

## 2021-06-22 VITALS — BP 138/80 | HR 94 | Temp 97.6°F | Ht 68.0 in | Wt 289.2 lb

## 2021-06-22 DIAGNOSIS — M791 Myalgia, unspecified site: Secondary | ICD-10-CM

## 2021-06-22 DIAGNOSIS — R350 Frequency of micturition: Secondary | ICD-10-CM

## 2021-06-22 DIAGNOSIS — R000 Tachycardia, unspecified: Secondary | ICD-10-CM

## 2021-06-22 LAB — CBC
HCT: 42.6 % (ref 36.0–46.0)
Hemoglobin: 13.9 g/dL (ref 12.0–15.0)
MCHC: 32.7 g/dL (ref 30.0–36.0)
MCV: 84.9 fl (ref 78.0–100.0)
Platelets: 296 10*3/uL (ref 150.0–400.0)
RBC: 5.02 Mil/uL (ref 3.87–5.11)
RDW: 12.9 % (ref 11.5–15.5)
WBC: 8.8 10*3/uL (ref 4.0–10.5)

## 2021-06-22 LAB — SEDIMENTATION RATE: Sed Rate: 41 mm/hr — ABNORMAL HIGH (ref 0–20)

## 2021-06-22 LAB — CK: Total CK: 45 U/L (ref 7–177)

## 2021-06-22 LAB — MAGNESIUM: Magnesium: 2 mg/dL (ref 1.5–2.5)

## 2021-06-22 LAB — HEMOGLOBIN A1C: Hgb A1c MFr Bld: 5.6 % (ref 4.6–6.5)

## 2021-06-22 LAB — C-REACTIVE PROTEIN: CRP: <1 mg/dL (ref 0.5–20.0)

## 2021-06-22 NOTE — Progress Notes (Signed)
[facility] [address] [place] [address] Dept: [phone] Dept Fax: [phone]  Office Visit  Subjective:    Patient ID: Nicole Mendoza, female    DOB: 11-07-1990, 31 y.o..   MRN: [id number]  Chief Complaint  Patient presents with   Follow-up    F/u chronic issues.  C/o having muscle aches/twitching and also having sinus pressure, LT ear pain on/off x 4 days.  (Neg Covid Test).      History of Present Illness:  Patient is in today complaining of a week-long flare in her POTS symptoms. Ms. Surprenant notes that her cardiologist has presumed that she has this condition, She did not have the demonstrated orthostasis, but was on metoprolol, which was felt to be masking this. She notes that she has flares, particularly around the time of her menses. Her current flare of symptoms appears to be related to this. She has noted a pattern of feeling muscle weakness at the times when this flares up, particularly in the lower extremities, which causes her knees to buckle. With her most recent episode, however, she has noted some knots developing int he muscles of her arms, shoulders, and lower back. These are shifting and quite tender. She wonders if her muscle issues could represent fibromyalgia. She also notes a past history of a positive ANA with speckled pattern.  Also, over the past week she has had a some sinus pressure and noted some off and on left ear congestion. She also notes recent urinary frequency and a foul odor to her urine. She had been seen in Nov and had 4+ leukocytes, but a negative urine culture.  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   Recurrent sinusitis 11/09/2020   Pre-nodular edema of the vocal folds 11/09/2020   Laryngospasms 11/09/2020   ASCUS of cervix with negative high risk HPV 07/10/2020   Cardiac murmur 09/02/2019   Tachycardia 09/01/2019   Salivary stone 09/01/2019   Anaphylactic shock due to adverse food reaction 01/22/2019   Seasonal and perennial allergic rhinitis 01/22/2019   Morbid obesity with BMI of 40.0-44.9, adult ([place]) 10/28/2017   Moderate persistent asthma without complication 25/00/3704   Essential hypertension 04/22/2017   Dysmenorrhea 06/26/2016   Menorrhagia with regular cycle 06/26/2016   Gastroesophageal reflux disease 12/22/2015   Iron deficiency anemia 11/02/2014   Vitamin D deficiency 11/02/2014   Past Surgical History:  Procedure Laterality Date   NO PAST SURGERIES     Family History  Problem Relation Age of Onset   Allergic rhinitis Father    Asthma Father    Allergic rhinitis Sister    Asthma Sister    Angioedema Neg Hx    Atopy Neg Hx    Eczema Neg Hx    Immunodeficiency Neg Hx    Urticaria Neg Hx    Outpatient Medications Prior to Visit  Medication Sig Dispense Refill   Ascorbic Acid (SUPER C COMPLEX PO)      ASMANEX, 120 METERED DOSES, 220 MCG/INH inhaler TAKE 2 PUFFS BY MOUTH TWICE A DAY 1 each 4   azithromycin (ZITHROMAX) 500 MG tablet TAKE 1 TABLET (500  MG TOTAL) BY MOUTH EVERY MONDAY, WEDNESDAY, AND FRIDAY. 12 tablet 11   budesonide (PULMICORT) 0.5 MG/2ML nebulizer solution Take 0.5 mg by nebulization as needed (shortness of breath and wheezing).     cetirizine (ZYRTEC) 10 MG tablet Take by mouth.     dexlansoprazole (DEXILANT) 60 MG capsule Take 1 capsule (60 mg total) by mouth daily. 30 capsule 5   EPINEPHrine (AUVI-Q) 0.3 mg/0.3 mL IJ SOAJ injection Use as directed for severe allergic reactions 4 each 1   famotidine (PEPCID) 20 MG tablet TAKE 1 TABLET BY MOUTH TWICE A DAY 180  tablet 5   ferrous sulfate 325 (65 FE) MG tablet 3 (three) times a week.      Fluticasone Propionate (XHANCE) 93 MCG/ACT EXHU Place 2 sprays into both nostrils 2 (two) times daily. 32 mL 5   hydrOXYzine (ATARAX/VISTARIL) 25 MG tablet Take 1 tablet (25 mg total) by mouth 3 (three) times daily as needed. 30 tablet 1   levalbuterol (XOPENEX HFA) 45 MCG/ACT inhaler INHALE 2 PUFFS INTO THE LUNGS EVERY 6 HOURS AS NEEDED FOR WHEEZE 15 each 1   levalbuterol (XOPENEX) 1.25 MG/3ML nebulizer solution USE 1 VIAL BY NEBULIZATION EVERY 6 (SIX) HOURS AS NEEDED FOR WHEEZING. 90 mL 1   metoprolol succinate (TOPROL-XL) 100 MG 24 hr tablet Take 1 tablet (100 mg total) by mouth daily. Take with or immediately following a meal. 90 tablet 3   mometasone (ASMANEX, 120 METERED DOSES,) 220 MCG/ACT inhaler Inhale 2 puffs into the lungs 2 (two) times daily. 1 each 4   montelukast (SINGULAIR) 10 MG tablet TAKE 1 TABLET BY MOUTH EVERY DAY 90 tablet 1   norethindrone (MICRONOR) 0.35 MG tablet Take 0.35 mg by mouth.     ondansetron (ZOFRAN) 4 MG tablet Take 1 tablet (4 mg total) by mouth every 8 (eight) hours as needed for nausea or vomiting. 20 tablet 0   Prenatal Vit-Fe Fumarate-FA (MULTIVITAMIN-PRENATAL) 27-0.8 MG TABS tablet Take 1 tablet by mouth daily at 12 noon.     Probiotic Product ([facility] COLON HEALTH) CAPS      sucralfate (CARAFATE) 1 g tablet Take 1 tablet (1 g total) by mouth 4 (four) times daily -  with meals and at bedtime. 120 tablet 3   No facility-administered medications prior to visit.   Allergies  Allergen Reactions   Budesonide-Formoterol Fumarate Other (See Comments)    Causes Hypertension   Penicillins Hives   Raspberry Hives   Spiriva Respimat [Tiotropium Bromide Monohydrate] Other (See Comments)    Pt states medication causes dizziness   Albuterol Palpitations   Losartan Rash    Objective:   Today's Vitals   06/22/21 0812  BP: 138/80  Pulse: 94  Temp: 97.6 F (36.4 C)  TempSrc:  Temporal  SpO2: 99%  Weight: 289 lb 3.2 oz (131.2 kg)  Height: 5\' 8"  (1.727 m)   Body mass index is 43.97 kg/m.   General: Well developed, well nourished. No acute distress. HEENT: Normocephalic, non-traumatic. Conjunctiva clear, but mild dried secretions around the eyes..   External ears normal. EAC and TMs normal bilaterally. Nose clear without congestion or rhinorrhea.   Mucous membranes moist. Oropharynx clear. Good dentition. Neck: Supple. No lymphadenopathy. No thyromegaly. Lungs: Clear to auscultation bilaterally. No wheezing, rales or rhonchi. CV: RRR without murmurs or rubs. Pulses 2+ bilaterally. Extremities: Full ROM. No muscle swelling or tenderness. No edema noted. Psych: Alert and oriented. Normal mood and affect.  Health Maintenance Due  Topic Date Due  Hepatitis C Screening  Never done   Pneumococcal Vaccine 53-56 Years old (2 - PCV) 05/13/2019   COVID-19 Vaccine (3 - Pfizer risk series) 04/21/2020   INFLUENZA VACCINE  01/15/2021     Assessment & Plan:   1. Myalgia We discussed whether the current symptoms might represent some acute myositis. I will check screening labs. I explained that the prior ANA is non-specific without other laboratory findings to suggest inflammation. We did discuss that fibromyalgia is a diagnosis based on longer term issues with muscular pain, that there are not laboratory markers, and that this would need to be assessed over time. I pointed out that exercise is the most proven therapy for improving function for patients with fibromyalgia, but recognize this can be difficult due to POTS. She notes her cardiologist has urged her to do exercise on a stationary bike. If her symptoms are not improving over the next 4-6 weeks, she should follow-up sooner with me.  - Sedimentation rate - C-reactive protein - CK (Creatine Kinase) - CBC - Magnesium  2. Urinary frequency I will check a repeat UA and a HbA1c to assess her frequency.  -  Urinalysis w microscopic + reflex cultur - Hemoglobin A1c  3. Tachycardia- Possible POTS As her menses do tend to flare her POTS, we did discuss her speaking with her GYN about birth control options. She notes she has been on progesterone only methods in the past. I recommended she consider Depo-Provera, as it would likely suppress her menses.   [name], MD

## 2021-06-23 LAB — URINALYSIS W MICROSCOPIC + REFLEX CULTURE
Bacteria, UA: NONE SEEN /HPF
Bilirubin Urine: NEGATIVE
Glucose, UA: NEGATIVE
Hgb urine dipstick: NEGATIVE
Hyaline Cast: NONE SEEN /LPF
Ketones, ur: NEGATIVE
Leukocyte Esterase: NEGATIVE
Nitrites, Initial: NEGATIVE
Protein, ur: NEGATIVE
RBC / HPF: NONE SEEN /HPF (ref 0–2)
Specific Gravity, Urine: 1.011 (ref 1.001–1.035)
WBC, UA: NONE SEEN /HPF (ref 0–5)
pH: 5.5 (ref 5.0–8.0)

## 2021-06-23 LAB — NO CULTURE INDICATED

## 2021-06-25 ENCOUNTER — Other Ambulatory Visit: Payer: Self-pay

## 2021-06-25 ENCOUNTER — Encounter: Payer: Self-pay | Admitting: Internal Medicine

## 2021-06-25 ENCOUNTER — Ambulatory Visit (INDEPENDENT_AMBULATORY_CARE_PROVIDER_SITE_OTHER): Payer: 59 | Admitting: Internal Medicine

## 2021-06-25 ENCOUNTER — Telehealth: Payer: Self-pay

## 2021-06-25 VITALS — Ht 68.0 in | Wt 286.8 lb

## 2021-06-25 DIAGNOSIS — R000 Tachycardia, unspecified: Secondary | ICD-10-CM | POA: Diagnosis not present

## 2021-06-25 DIAGNOSIS — R002 Palpitations: Secondary | ICD-10-CM | POA: Diagnosis not present

## 2021-06-25 MED ORDER — MECLIZINE HCL 25 MG PO TABS: 25.0000 mg | ORAL_TABLET | Freq: Three times a day (TID) | ORAL | 1 refills | Status: DC | PRN

## 2021-06-25 NOTE — Patient Instructions (Addendum)
Medication Instructions:   Your physician has recommended you make the following change in your medication:   ** Begin Meclizine 25mg  - 1 tablet by mouth every 8 hours as needed  *If you need a refill on your cardiac medications before your next appointment, please call your pharmacy*   Lab Work: None ordered.  If you have labs (blood work) drawn today and your tests are completely normal, you will receive your results only by: [place] (if you have MyChart) OR A paper copy in the mail If you have any lab test that is abnormal or we need to change your treatment, we will call you to review the results.   Testing/Procedures: None ordered.    Follow-Up: At [facility], you and your health needs are our priority.  As part of our continuing mission to provide you with exceptional heart care, we have created designated Provider Care Teams.  These Care Teams include your primary Cardiologist (physician) and Advanced Practice Providers (APPs -  Physician Assistants and Nurse Practitioners) who all work together to provide you with the care you need, when you need it.  We recommend signing up for the patient portal called "MyChart".  Sign up information is provided on this After Visit Summary.  MyChart is used to connect with patients for Virtual Visits (Telemedicine).  Patients are able to view lab/test results, encounter notes, upcoming appointments, etc.  Non-urgent messages can be sent to your provider as well.   To learn more about what you can do with MyChart, go to [email or web address].    Your next appointment:     Follow up with Dr Caryl Comes in 6 months

## 2021-06-25 NOTE — Progress Notes (Addendum)
Patient Care Team: [name], MD as PCP - General (Family Medicine) Deboraha Sprang, MD as Consulting Physician (Cardiology) Ernst Bowler Gwenith Daily, MD as Consulting Physician (Allergy and Immunology) Chyrel Masson, DO (Otolaryngology) Fenton Foy, NP as Nurse Practitioner (Pulmonary Disease) Dohmeier, Asencion Partridge, MD as Consulting Physician (Neurology)   HPI  Nicole Mendoza is a 31 y.o. female seen in follow-up for exertional palpitations in the wake of prior COVID infection without an evaluation 5/22 objective evidence of orthostatic intolerance albeit in the context of high-dose metoprolol which was symptomatically beneficial.  Some degree of hypertension  Today, she is not feeling well. Recently, she was been experiencing symptoms she relates to POTS. The episodes started last week when she suddenly became dizzy and the room starts spinning. She has associated limb weakness and myalgias with the dizziness. These episodes occur primarily when she lays down. She becomes dizzy when getting in bed which worsens when turning over . However, typically, her heart rate will go back to normal after some time. During the episodes, her heart rate will stay elevated. Recently, she reports a throbbing sensation in her L ear and headache localized to her frontal head. The dizziness and pain prevents her from sleeping properly. She denies a history of vertigo.   She endorses congestion due to allergies. She has in the past given prednisone for her allergies which improved her congestion. However, she did not want to take prednisone for these recent episodes because she did not want the medicine to negatively affect her POTS.   She also endorses severe pain during menstruation. She reports she has been told she does not have endometriosis and she cannot be on menses suppressing birth control because her blood pressure was too high and the estrogen component was removed  The patient denies  chest pain, shortness of breath, nocturnal dyspnea, orthopnea or peripheral edema.  There have been no syncope.  Complains of sleeplessness, dizziness, limb weakness, myalgias, L ear pain, headaches, or allergies.  Records and Results Reviewed  DATE TEST EF   3/21 Echo  60-65 %         Date Cr K Hgb  11/22 0.69 4.4 13.9           Past Medical History:  Diagnosis Date   Allergic rhinitis    Allergy    Angio-edema    Asthma    Fatty liver    GERD (gastroesophageal reflux disease)    Hypertension    Recurrent upper respiratory infection (URI)    Tachycardia     Past Surgical History:  Procedure Laterality Date   NO PAST SURGERIES      Current Meds  Medication Sig   Ascorbic Acid (SUPER C COMPLEX PO)    ASMANEX, 120 METERED DOSES, 220 MCG/INH inhaler TAKE 2 PUFFS BY MOUTH TWICE A DAY   azithromycin (ZITHROMAX) 500 MG tablet TAKE 1 TABLET (500 MG TOTAL) BY MOUTH EVERY MONDAY, WEDNESDAY, AND FRIDAY.   budesonide (PULMICORT) 0.5 MG/2ML nebulizer solution Take 0.5 mg by nebulization as needed (shortness of breath and wheezing).   cetirizine (ZYRTEC) 10 MG tablet Take by mouth.   dexlansoprazole (DEXILANT) 60 MG capsule Take 1 capsule (60 mg total) by mouth daily.   EPINEPHrine (AUVI-Q) 0.3 mg/0.3 mL IJ SOAJ injection Use as directed for severe allergic reactions   famotidine (PEPCID) 20 MG tablet TAKE 1 TABLET BY MOUTH TWICE A DAY   ferrous sulfate 325 (65 FE) MG tablet 3 (three)  times a week.    Fluticasone Propionate (XHANCE) 93 MCG/ACT EXHU Place 2 sprays into both nostrils 2 (two) times daily.   hydrOXYzine (ATARAX/VISTARIL) 25 MG tablet Take 1 tablet (25 mg total) by mouth 3 (three) times daily as needed.   levalbuterol (XOPENEX HFA) 45 MCG/ACT inhaler INHALE 2 PUFFS INTO THE LUNGS EVERY 6 HOURS AS NEEDED FOR WHEEZE   levalbuterol (XOPENEX) 1.25 MG/3ML nebulizer solution USE 1 VIAL BY NEBULIZATION EVERY 6 (SIX) HOURS AS NEEDED FOR WHEEZING.   metoprolol succinate  (TOPROL-XL) 100 MG 24 hr tablet Take 1 tablet (100 mg total) by mouth daily. Take with or immediately following a meal.   mometasone (ASMANEX, 120 METERED DOSES,) 220 MCG/ACT inhaler Inhale 2 puffs into the lungs 2 (two) times daily.   montelukast (SINGULAIR) 10 MG tablet TAKE 1 TABLET BY MOUTH EVERY DAY   norethindrone (MICRONOR) 0.35 MG tablet Take 0.35 mg by mouth.   ondansetron (ZOFRAN) 4 MG tablet Take 1 tablet (4 mg total) by mouth every 8 (eight) hours as needed for nausea or vomiting.   Prenatal Vit-Fe Fumarate-FA (MULTIVITAMIN-PRENATAL) 27-0.8 MG TABS tablet Take 1 tablet by mouth daily at 12 noon.   Probiotic Product ([place]) CAPS     Allergies  Allergen Reactions   Budesonide-Formoterol Fumarate Other (See Comments)    Causes Hypertension   Penicillins Hives   Raspberry Hives   Spiriva Respimat [Tiotropium Bromide Monohydrate] Other (See Comments)    Pt states medication causes dizziness   Albuterol Palpitations   Losartan Rash      Review of Systems negative except from HPI and PMH  Physical Exam Ht 5\' 8"  (1.727 m)    Wt 286 lb 12.8 oz (130.1 kg)    SpO2 99%    BMI 43.61 kg/m  Well developed and nourished Morbidly obese in no acute distress HENT normal Neck supple  Clear Regular rate and rhythm, no murmurs or gallops Abd-soft with active BS No Clubbing cyanosis edema Skin-warm and dry A & Oriented  Grossly normal sensory and motor function  ECG sinus at 96 Intervals 13/09/33  CrCl cannot be calculated (Patient's most recent lab result is older than the maximum 21 days allowed.).   Assessment and Plan:  Hypertension  Dizziness  Morbid obesity  Exertional tachypalpitations  Sinus tachycardia  GI symptoms question irritable bowel  Blood pressure is reasonably controlled, and in the context of her palpitations we will continue her on metoprolol 100 mg daily.  She can take an extra 50 mg if she is having significant tachy  palpitations.  Her dizziness does not sound dysautonomic in that it is not relieved by lying it is aggravated by turning over in bed etc. and sounds more like vertigo.  She is having a concurrent sinus and ear issue and apparently middle ear infections can cause vertigo.  I suggested that she follow-up with her PCP and I have been in touch with him already to consider therapy for possible middle ear infection and sinusitis as well as therapy to try to mitigate her vertigo symptoms.  We will give her meclizine 25 mg every 8 and she is advised that they can cause somnolence     I,Mykaella Javier,acting as a scribe for Virl Axe, MD.,have documented all relevant documentation on the behalf of Virl Axe, MD,as directed by  Virl Axe, MD while in the presence of Virl Axe, MD. I, Virl Axe, MD, have reviewed all documentation for this visit. The documentation on 06/25/21 for the  exam, diagnosis, procedures, and orders are all accurate and complete.   Mykaella Garlon Hatchet

## 2021-06-25 NOTE — Telephone Encounter (Signed)
She really needs to have someone look into her ears. Can she come in today or tomorrow? I can double book her on my schedule

## 2021-06-25 NOTE — Telephone Encounter (Signed)
Pt has had ear pain and sinus pressure for 6 days with intermitted sore throat 2 covid test negative. Vertigo symptoms. Sow cardiologist this am during a pots flare and he is concerned for inner ear infection or inflammation with allergies. He wanted some to look into her ear. She has done sinus rinses, xhance, zyrtec, taking azithromycin and all other current meds.

## 2021-06-25 NOTE — Telephone Encounter (Signed)
Pt is scheduled for tomorrow at 11am with anne ambs fnp

## 2021-06-26 ENCOUNTER — Encounter: Payer: Self-pay | Admitting: Family Medicine

## 2021-06-26 ENCOUNTER — Ambulatory Visit (INDEPENDENT_AMBULATORY_CARE_PROVIDER_SITE_OTHER): Payer: 59 | Admitting: Family Medicine

## 2021-06-26 ENCOUNTER — Telehealth: Payer: Self-pay | Admitting: Physical Medicine and Rehabilitation

## 2021-06-26 VITALS — BP 128/80 | HR 86 | Temp 98.5°F | Resp 12

## 2021-06-26 DIAGNOSIS — R42 Dizziness and giddiness: Secondary | ICD-10-CM | POA: Insufficient documentation

## 2021-06-26 DIAGNOSIS — H6982 Other specified disorders of Eustachian tube, left ear: Secondary | ICD-10-CM | POA: Insufficient documentation

## 2021-06-26 DIAGNOSIS — B999 Unspecified infectious disease: Secondary | ICD-10-CM

## 2021-06-26 DIAGNOSIS — K219 Gastro-esophageal reflux disease without esophagitis: Secondary | ICD-10-CM

## 2021-06-26 DIAGNOSIS — J302 Other seasonal allergic rhinitis: Secondary | ICD-10-CM

## 2021-06-26 DIAGNOSIS — J3089 Other allergic rhinitis: Secondary | ICD-10-CM

## 2021-06-26 DIAGNOSIS — D808 Other immunodeficiencies with predominantly antibody defects: Secondary | ICD-10-CM | POA: Diagnosis not present

## 2021-06-26 DIAGNOSIS — J4540 Moderate persistent asthma, uncomplicated: Secondary | ICD-10-CM | POA: Diagnosis not present

## 2021-06-26 DIAGNOSIS — T7800XD Anaphylactic reaction due to unspecified food, subsequent encounter: Secondary | ICD-10-CM

## 2021-06-26 DIAGNOSIS — Z79899 Other long term (current) drug therapy: Secondary | ICD-10-CM

## 2021-06-26 NOTE — Telephone Encounter (Signed)
Pt called and wondering if she can come see [facility]. She has never been seen by [facility]. Her sister recommended her to see him for her back pain.   CB [phone]

## 2021-06-26 NOTE — Telephone Encounter (Signed)
I am certain that [name] took excellent care of her!   Salvatore Marvel, MD [facility] [place] [facility] [place]

## 2021-06-26 NOTE — Patient Instructions (Addendum)
Asthma - Continue montelukast 10 mg once a day to prevent cough or wheeze - Continue Asthmanex 220- 2 puffs twice a day to prevent cough or wheeze - Continue levalbuterol 2 puffs every 4 hours as needed for cough or wheeze OR Instead use levalbuterol solution via nebulizer one unit vial every 4-6 hours as needed for cough or wheeze - For asthma flares, continue Pulmicort 0.5 mg twice a day via nebulizer for 2 weeks or until cough and wheeze free.  Allergic rhinitis - Continue saline rinses several times a day - Continue cetirizine 10 mg once a day as needed for a runny nose or itch. - Continue Xhance 2 sprays in each nostril twice a day for nasal symptoms -Continue allergen avoidance measures directed toward grass pollen, weed pollen, tree pollen, and dust mite as listed below  Vertigo Begin Epley Maneuver three times a day (handout given and return demonstration completed in clinic)  Eustachian tube dysfunction Continue nasal saline rinses and Xhance twice a day Call the clinic if you develop a fever or if your symptoms worsen or do not improve   Recurrent infections - START YOUR HIZENTRA (we will plan for May 2023) - Continue to monitor frequency of infections.   Reflux - Continue dietary and lifestyle modifications as listed below - Continue Dexilant once a day to prevent reflux.  - Continue famotidine 20 mg twice a day to prevent reflux  Vocal cord dysfunction - Continue with speech therapy as you have been  Food allergy - Continue to avoid tree nuts.  In case of an allergic reaction, take Benadryl 50 mg every 4 hours, and if life-threatening symptoms occur, inject with AuviQ 0.3 mg. - Make an appointment for a mixed tree nut challenge.   Call the clinic if this treatment plan is not working well for you.  Follow up in 1 month or sooner if needed.  Reducing Pollen Exposure The American Academy of Allergy, Asthma and Immunology suggests the following steps to reduce your  exposure to pollen during allergy seasons. Do not hang sheets or clothing out to dry; pollen may collect on these items. Do not mow lawns or spend time around freshly cut grass; mowing stirs up pollen. Keep windows closed at night.  Keep car windows closed while driving. Minimize morning activities outdoors, a time when pollen counts are usually at their highest. Stay indoors as much as possible when pollen counts or humidity is high and on windy days when pollen tends to remain in the air longer. Use air conditioning when possible.  Many air conditioners have filters that trap the pollen spores. Use a HEPA room air filter to remove pollen form the indoor air you breathe.   Control of Dust Mite Allergen Dust mites play a major role in allergic asthma and rhinitis. They occur in environments with high humidity wherever human skin is found. Dust mites absorb humidity from the atmosphere (ie, they do not drink) and feed on organic matter (including shed human and animal skin). Dust mites are a microscopic type of insect that you cannot see with the naked eye. High levels of dust mites have been detected from mattresses, pillows, carpets, upholstered furniture, bed covers, clothes, soft toys and any woven material. The principal allergen of the dust mite is found in its feces. A gram of dust may contain 1,000 mites and 250,000 fecal particles. Mite antigen is easily measured in the air during house cleaning activities. Dust mites do not bite and do not cause  harm to humans, other than by triggering allergies/asthma.  Ways to decrease your exposure to dust mites in your home:  1. Encase mattresses, box springs and pillows with a mite-impermeable barrier or cover  2. Wash sheets, blankets and drapes weekly in hot water (130 F) with detergent and dry them in a dryer on the hot setting.  3. Have the room cleaned frequently with a vacuum cleaner and a damp dust-mop. For carpeting or rugs, vacuuming with a  vacuum cleaner equipped with a high-efficiency particulate air (HEPA) filter. The dust mite allergic individual should not be in a room which is being cleaned and should wait 1 hour after cleaning before going into the room.  4. Do not sleep on upholstered furniture (eg, couches).  5. If possible removing carpeting, upholstered furniture and drapery from the home is ideal. Horizontal blinds should be eliminated in the rooms where the person spends the most time (bedroom, study, television room). Washable vinyl, roller-type shades are optimal.  6. Remove all non-washable stuffed toys from the bedroom. Wash stuffed toys weekly like sheets and blankets above.  7. Reduce indoor humidity to less than 50%. Inexpensive humidity monitors can be purchased at most hardware stores. Do not use a humidifier as can make the problem worse and are not recommended.

## 2021-06-26 NOTE — Progress Notes (Signed)
[place] [address] Dept: [phone]  FOLLOW UP NOTE  Patient ID: Nicole Mendoza, female    DOB: 12/09/1990  Age: 31 y.o. MRN: [id number] Date of Office Visit: 06/26/2021  Assessment  Chief Complaint: Allergic Rhinitis , Asthma, and Nasal Congestion (Post nasal drip in her throat, clear. Sinus pressure forehead,behind left eye, cheeks)  HPI Nicole Mendoza is a 31 year old female who presents to the clinic for acute left ear pain.  She was last seen in this clinic on 05/31/2021 by Dr. Ernst Bowler for evaluation of asthma, vocal cord dysfunction, recurrent infection, allergic rhinitis, reflux, food allergy to tree nut, snoring, and possible POTS.  At today's visit, she reports that, about 2 weeks ago, she began to experience extreme fatigue, shakiness, and dizziness possibly related to POTS.  She also began experiencing intermittent left ear pain and pressure as well as sinus pain and pressure.  She denies fever and sick contacts.  She reports at that time beginning to feel as though the whole room was spinning with any movement.  She is reporting overwhelming fatigue that began on December 26.  Allergic rhinitis is reported as moderately well controlled with clear rhinorrhea, nasal congestion which alternates between left-sided congestion and right-sided congestion within the same day, and postnasal drainage.  She continues nasal saline rinses and Xhance daily.  Asthma is reported as well controlled with no shortness of breath, cough, or wheeze with activity or rest.  She continues montelukast 10 mg once a day, Asmanex 220-2 puffs twice a day, and rarely needs to use Xopenex for asthma symptom relief.  She is not currently using Incruse.  She continues physical therapy exercises for control of vocal cord dysfunction.  She continues utilize her CPAP every night.  She has recently had an evaluation for POTS with her cardiologist, Dr. Caryl Comes, with [facility] on Friday with continuation of  metoprolol 100 mg daily.  She continues to avoid peanuts and tree nuts with no accidental ingestion or EpiPen use since her last visit to this clinic.  She has recently passed an in office oral peanut challenge, however, has not introduce these into her diet at this time. Her current medications are listed in the chart.   Drug Allergies:  Allergies  Allergen Reactions   Budesonide-Formoterol Fumarate Other (See Comments)    Causes Hypertension   Penicillins Hives   Raspberry Hives   Spiriva Respimat [Tiotropium Bromide Monohydrate] Other (See Comments)    Pt states medication causes dizziness   Albuterol Palpitations   Losartan Rash    Physical Exam: BP 128/80 (BP Location: Right Arm, Patient Position: Sitting, Cuff Size: Large)    Pulse 86    Temp 98.5 F (36.9 C) (Temporal)    Resp 12    SpO2 98%    Physical Exam Vitals reviewed.  Constitutional:      Appearance: Normal appearance.  HENT:     Head: Normocephalic and atraumatic.     Right Ear: Tympanic membrane normal.     Ears:     Comments: Right tympanic membrane normal.  Left tympanic membrane with clear effusion.    Nose:     Comments: Bilateral nares normal.  Pharynx slightly erythematous with no exudate.  Eyes normal. Eyes:     Conjunctiva/sclera: Conjunctivae normal.  Cardiovascular:     Rate and Rhythm: Normal rate and regular rhythm.     Heart sounds: Normal heart sounds. No murmur heard. Pulmonary:     Effort: Pulmonary effort is  normal.     Breath sounds: Normal breath sounds.     Comments: Lungs clear to auscultation Musculoskeletal:        General: Normal range of motion.     Cervical back: Normal range of motion and neck supple.  Skin:    General: Skin is warm and dry.  Neurological:     Mental Status: She is alert and oriented to person, place, and time.  Psychiatric:        Mood and Affect: Mood normal.        Behavior: Behavior normal.        Thought Content: Thought content normal.         Judgment: Judgment normal.    Diagnostics: FVC 3.47, FEV1 2.75.  Predicted FVC 4.36, predicted FEV1 3.65.  Spirometry indicates possible mild restriction.  This is consistent with previous spirometry readings.  Assessment and Plan: 1. Anaphylactic shock due to food, subsequent encounter   2. Moderate persistent asthma without complication   3. Seasonal and perennial allergic rhinitis   4. 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 Recurrent infections   6. Gastroesophageal reflux disease, unspecified whether esophagitis present   7. Current use of beta blocker   8. Dysfunction of left eustachian tube   9. Vertigo      Patient Instructions  Asthma - Continue montelukast 10 mg once a day to prevent cough or wheeze - Continue Asthmanex 220- 2 puffs twice a day to prevent cough or wheeze - Continue levalbuterol 2 puffs every 4 hours as needed for cough or wheeze OR Instead use levalbuterol solution via nebulizer one unit vial every 4-6 hours as needed for cough or wheeze - For asthma flares, continue Pulmicort 0.5 mg twice a day via nebulizer for 2 weeks or until cough and wheeze free.  Allergic rhinitis - Continue saline rinses several times a day - Continue cetirizine 10 mg once a day as needed for a runny nose or itch. - Continue Xhance 2 sprays in each nostril twice a day for nasal symptoms -Continue allergen avoidance measures directed toward grass pollen, weed pollen, tree pollen, and dust mite as listed below  Vertigo Begin Epley Maneuver three times a day (handout given and return demonstration completed in clinic)  Eustachian tube dysfunction Continue nasal saline rinses and Xhance twice a day Call the clinic if you develop a fever or if your symptoms worsen or do not improve   Recurrent infections - START YOUR HIZENTRA (we will plan for May 2023) - Continue to monitor frequency of infections.   Reflux - Continue  dietary and lifestyle modifications as listed below - Continue Dexilant once a day to prevent reflux.  - Continue famotidine 20 mg twice a day to prevent reflux  Vocal cord dysfunction - Continue with speech therapy as you have been  Food allergy - Continue to avoid tree nuts.  In case of an allergic reaction, take Benadryl 50 mg every 4 hours, and if life-threatening symptoms occur, inject with AuviQ 0.3 mg. - Make an appointment for a mixed tree nut challenge.   Call the clinic if this treatment plan is not working well for you.  Follow up in 1 month or sooner if needed.  Return in about 4 weeks (around 07/24/2021), or if symptoms worsen or fail to improve.    Thank you for the opportunity to care for this patient.  Please do not hesitate to contact me  with questions.  Gareth Morgan, FNP [facility] [place] [facility] [place]

## 2021-06-28 ENCOUNTER — Encounter: Payer: Self-pay | Admitting: Physical Medicine and Rehabilitation

## 2021-06-28 ENCOUNTER — Other Ambulatory Visit: Payer: Self-pay

## 2021-06-28 ENCOUNTER — Ambulatory Visit (INDEPENDENT_AMBULATORY_CARE_PROVIDER_SITE_OTHER): Payer: 59 | Admitting: Physical Medicine and Rehabilitation

## 2021-06-28 VITALS — BP 146/86 | HR 99

## 2021-06-28 DIAGNOSIS — M25512 Pain in left shoulder: Secondary | ICD-10-CM

## 2021-06-28 DIAGNOSIS — R52 Pain, unspecified: Secondary | ICD-10-CM

## 2021-06-28 DIAGNOSIS — M7918 Myalgia, other site: Secondary | ICD-10-CM

## 2021-06-28 DIAGNOSIS — R5382 Chronic fatigue, unspecified: Secondary | ICD-10-CM

## 2021-06-28 DIAGNOSIS — M25551 Pain in right hip: Secondary | ICD-10-CM

## 2021-06-28 DIAGNOSIS — M25511 Pain in right shoulder: Secondary | ICD-10-CM

## 2021-06-28 DIAGNOSIS — M545 Low back pain, unspecified: Secondary | ICD-10-CM

## 2021-06-28 DIAGNOSIS — M25552 Pain in left hip: Secondary | ICD-10-CM

## 2021-06-28 DIAGNOSIS — M797 Fibromyalgia: Secondary | ICD-10-CM

## 2021-06-28 DIAGNOSIS — M542 Cervicalgia: Secondary | ICD-10-CM

## 2021-06-28 DIAGNOSIS — G8929 Other chronic pain: Secondary | ICD-10-CM

## 2021-06-28 NOTE — Progress Notes (Signed)
Nicole Mendoza - 31 y.o. female MRN [id number]  Date of birth: 12/11/1990  Office Visit Note: Visit Date: 06/28/2021 PCP: [name], MD Referred by: [name], MD  Subjective: Chief Complaint  Patient presents with   Neck - Pain   Right Shoulder - Pain   Left Shoulder - Pain   Left Arm - Pain   Right Arm - Pain   Lower Back - Pain   Left Leg - Pain   Right Leg - Pain   HPI: Nicole Mendoza is a 31 y.o. female who comes in today Referred by her sister Anda Latina whom is also seen in our practice for evaluation of chronic, worsening and severe generalized body pain, muscle cramping and extreme fatigue. Patient reports symptoms have been ongoing for several months. Patient reports she had COVID in April of 2022 and noticed her symptoms started shortly after. Patient reports her body pain, muscle cramps and fatigue is constant and states these symptoms seem to worsen with stress, lack of sleep and activity. Patient describes her pain as a sore, cramping and throbbing sensation, currently rates as 7 out of 10. Patient states her pain is very generalized, however if she did have to localize her most severe pain to one area of the body it would be the left scapular region. Patient reports some relief of pain with home exercise regimen, massage therapy, use of ice/heat and OTC medications as needed. Patient did attend formal physical therapy in 2022 at [facility] and reports some relief of pain with these treatments. 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 Patient states she was recently started seeing a therapist to help work through her stress and anxiety issues Patient was evaluated by Tana Coast PA-C with [facility] in 2021 whom ruled out  Sjogren's Syndrome. She states that he only evaluated her for this particular pathology. Patient also reports recent issues with dizziness and tachycardia that she contributes to POTS Syndrome. she was recently evaluated by Dr. Virl Axe at [facility] whom placed her on Meclizine as needed, he does not seem to think POTS Syndrome is a concern. Overall, patient states she is not doing well and feels like her current treatments are not helping. Patient denies focal weakness, numbness and tingling. Patient denies recent trauma or falls.   Patients Widespread Pain Index (WPI) score is 14 and Symptom Severity (SS) is 11. These scores indicate that this patient meets the diagnostic criteria for fibromyalgia.    22% Oswestry Disability Index Score: 10 to 20 (40%) moderate disability: The patient experiences more pain and difficulty with sitting, lifting and standing. Travel and social life are more difficult, and they may be disabled from work. Personal care, sexual activity and sleeping are not grossly affected, and the patient can usually be managed by conservative means.  Review of Systems  Musculoskeletal:  Positive for myalgias.  Neurological:  Negative for tingling, sensory change, focal weakness and weakness.  All other systems reviewed and are negative. Otherwise per HPI.  Assessment & Plan: Visit Diagnoses:    ICD-10-CM   1. Myofascial pain syndrome  M79.18 Ambulatory referral to Rheumatology    2. Generalized body aches  R52     3. Fibromyalgia  M79.7     4. Chronic fatigue  R53.82     5. Neck pain  M54.2     6. Chronic pain of both shoulders  M25.511    G89.29    M25.512     7. Chronic bilateral low back pain without sciatica  M54.50    G89.29     8. Bilateral hip pain  M25.551    M25.552        Plan: Findings:  Chronic, worsening and severe generalized body pain, muscle cramping and extreme fatigue. Patient has no complaints of radicular pain or paraesthesias. Patient  continues to have severe and debilitating pain despite good conservative therapies such as home exercise program, formal physical therapy, massage therapy and use of medications. Patient's clinical presentation and exam are consistent with myofascial pain syndrome and possible central sensitization pain syndrome such as fibromyalgia. Patients Widespread Pain Index (WPI) and Symptom Severity (SS) screening did meet the diagnostic criteria for fibromyalgia. We do not think patients pain pattern is directly related to her spine. We discussed treatment options and plan of care in detail with patient today. She really would like to re-establish an evaluation with a [facility] Rheumatologist. We believe the next step is to place referral for rheumatological evaluation with Dr. Vernelle Emerald, which we did complete today. We did discuss re-grouping with our in-house physical therapy team with a focus on manual treatments and possible dry needling. We also discussed medication management with Cymbalta (Duloxetine). Patient encouraged to remain active and continue home exercises as tolerated. We will follow-up with patient after she consults with rheumatology. No red flag symptoms noted upon exam.    Meds & Orders: No orders of the defined types were placed in this encounter.   Orders Placed This Encounter  Procedures   Ambulatory referral to Rheumatology    Follow-up: Return for follow up after rheumatology consult for re-evaluation.   Procedures: No procedures performed      Clinical History: No specialty comments available.   She reports that she has never smoked. She has never used smokeless tobacco.  Recent Labs    06/22/21 0841  HGBA1C 5.6    Objective:  VS:  HT:     WT:    BMI:      BP: (!) 146/86   HR:99bpm   TEMP: ( )   RESP:  Physical Exam Vitals and nursing note reviewed.  HENT:     Head: Normocephalic and atraumatic.     Right Ear: External ear normal.     Left Ear: External ear normal.      Nose: Nose normal.     Mouth/Throat:     Mouth: Mucous membranes are moist.  Eyes:     Extraocular Movements: Extraocular movements intact.  Cardiovascular:     Rate and Rhythm: Normal rate.     Pulses: Normal pulses.  Pulmonary:     Effort: Pulmonary effort is normal.  Abdominal:     General: Abdomen is flat. There is no distension.  Musculoskeletal:        General: Tenderness present.     Cervical back: Normal range of motion.     Comments: Pt rises from seated position to standing without difficulty. Good lumbar range of motion. Strong distal strength without clonus, no pain upon palpation of greater trochanters. Sensation intact bilaterally. Walks independently, gait steady.   Skin:    General: Skin is warm and dry.     Capillary Refill: Capillary refill takes less than 2 seconds.  Neurological:     General: No  focal deficit present.     Mental Status: She is alert and oriented to person, place, and time.  Psychiatric:        Mood and Affect: Mood normal.        Behavior: Behavior normal.    Ortho Exam  Imaging: No results found.  Past Medical/Family/Surgical/Social History: Medications & Allergies reviewed per EMR, new medications updated. 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   Recurrent sinusitis 11/09/2020   Pre-nodular edema of the vocal folds 11/09/2020   Laryngospasms 11/09/2020   ASCUS of cervix with negative high risk HPV 07/10/2020   Cardiac murmur 09/02/2019   Tachycardia 09/01/2019   Salivary  stone 09/01/2019   Anaphylactic shock due to adverse food reaction 01/22/2019   Seasonal and perennial allergic rhinitis 01/22/2019   Morbid obesity with BMI of 40.0-44.9, adult ([place]) 10/28/2017   Moderate persistent asthma without complication 27/25/3664   Essential hypertension 04/22/2017   Dysmenorrhea 06/26/2016   Menorrhagia with regular cycle 06/26/2016   Gastroesophageal reflux disease 12/22/2015   Iron deficiency anemia 11/02/2014   Vitamin D deficiency 11/02/2014   Past Medical History:  Diagnosis Date   Allergic rhinitis    Allergy    Angio-edema    Asthma    Fatty liver    GERD (gastroesophageal reflux disease)    Hypertension    Recurrent upper respiratory infection (URI)    Tachycardia    Family History  Problem Relation Age of Onset   Allergic rhinitis Father    Asthma Father    Allergic rhinitis Sister    Asthma Sister    Angioedema Neg Hx    Atopy Neg Hx    Eczema Neg Hx    Immunodeficiency Neg Hx    Urticaria Neg Hx    Past Surgical History:  Procedure Laterality Date   NO PAST SURGERIES     Social History   Occupational History   Not on file  Tobacco Use   Smoking status: Never   Smokeless tobacco: Never  Vaping Use   Vaping Use: Never used  Substance and Sexual Activity   Alcohol use: No    Alcohol/week: 0.0 standard drinks   Drug use: No   Sexual activity: Yes    Birth control/protection: Pill

## 2021-06-28 NOTE — Progress Notes (Signed)
Pt state neck pain that travels both shoulder and arms. Pt state her pain comes and goes. Pt state she doesn't know what cause the pain. Pt state she gets monthly massage and she is two weeks over due and she can tell. Pt state lower back pain that travels down both legs. Pt state any movement makes the pain worse. Pt state her muscle gets so tight and feel like she can't move. Pt state she uses heat and massage and over the counter pain meds to help ease her pain.  Numeric Pain Rating Scale and Functional Assessment Average Pain 8 Pain Right Now 6 My pain is intermittent, sharp, burning, dull, tingling, and aching Pain is worse with: walking, bending, sitting, standing, some activites, and laying down Pain improves with: heat/ice, therapy/exercise, medication, and injections   In the last MONTH (on 0-10 scale) has pain interfered with the following?  1. General activity like being  able to carry out your everyday physical activities such as walking, climbing stairs, carrying groceries, or moving a chair?  Rating(7)  2. Relation with others like being able to carry out your usual social activities and roles such as  activities at home, at work and in your community. Rating(9)  3. Enjoyment of life such that you have  been bothered by emotional problems such as feeling anxious, depressed or irritable?  Rating(9)

## 2021-07-04 ENCOUNTER — Encounter: Payer: Self-pay | Admitting: Allergy & Immunology

## 2021-07-05 ENCOUNTER — Other Ambulatory Visit: Payer: Self-pay

## 2021-07-05 MED ORDER — NYSTATIN 100000 UNIT/ML MT SUSP: 5.0000 mL | Freq: Four times a day (QID) | OROMUCOSAL | 0 refills | Status: DC

## 2021-07-06 ENCOUNTER — Encounter: Payer: 59 | Admitting: Family

## 2021-07-09 ENCOUNTER — Ambulatory Visit: Payer: 59 | Admitting: Family Medicine

## 2021-07-09 ENCOUNTER — Other Ambulatory Visit: Payer: Self-pay

## 2021-07-09 ENCOUNTER — Ambulatory Visit (INDEPENDENT_AMBULATORY_CARE_PROVIDER_SITE_OTHER): Payer: 59 | Admitting: Family Medicine

## 2021-07-09 VITALS — BP 136/80 | HR 95 | Temp 97.7°F | Ht 68.0 in | Wt 290.2 lb

## 2021-07-09 DIAGNOSIS — R5381 Other malaise: Secondary | ICD-10-CM

## 2021-07-09 DIAGNOSIS — M791 Myalgia, unspecified site: Secondary | ICD-10-CM

## 2021-07-09 DIAGNOSIS — U099 Post covid-19 condition, unspecified: Secondary | ICD-10-CM

## 2021-07-09 DIAGNOSIS — R5383 Other fatigue: Secondary | ICD-10-CM | POA: Diagnosis not present

## 2021-07-09 LAB — COMPREHENSIVE METABOLIC PANEL
ALT: 25 U/L (ref 0–35)
AST: 20 U/L (ref 0–37)
Albumin: 4.3 g/dL (ref 3.5–5.2)
Alkaline Phosphatase: 70 U/L (ref 39–117)
BUN: 13 mg/dL (ref 6–23)
CO2: 25 mEq/L (ref 19–32)
Calcium: 9.6 mg/dL (ref 8.4–10.5)
Chloride: 106 mEq/L (ref 96–112)
Creatinine, Ser: 0.65 mg/dL (ref 0.40–1.20)
GFR: 118.27 mL/min (ref >60.00)
Glucose, Bld: 103 mg/dL — ABNORMAL HIGH (ref 70–99)
Potassium: 3.8 mEq/L (ref 3.5–5.1)
Sodium: 140 mEq/L (ref 135–145)
Total Bilirubin: 0.4 mg/dL (ref 0.2–1.2)
Total Protein: 7.5 g/dL (ref 6.0–8.3)

## 2021-07-09 LAB — CBC WITH DIFFERENTIAL/PLATELET
Basophils Absolute: 0.1 10*3/uL (ref 0.0–0.1)
Basophils Relative: 0.9 % (ref 0.0–3.0)
Eosinophils Absolute: 0.1 10*3/uL (ref 0.0–0.7)
Eosinophils Relative: 1.3 % (ref 0.0–5.0)
HCT: 40 % (ref 36.0–46.0)
Hemoglobin: 13.1 g/dL (ref 12.0–15.0)
Lymphocytes Relative: 23.1 % (ref 12.0–46.0)
Lymphs Abs: 1.8 10*3/uL (ref 0.7–4.0)
MCHC: 32.9 g/dL (ref 30.0–36.0)
MCV: 84.9 fl (ref 78.0–100.0)
Monocytes Absolute: 0.7 10*3/uL (ref 0.1–1.0)
Monocytes Relative: 8.2 % (ref 3.0–12.0)
Neutro Abs: 5.3 10*3/uL (ref 1.4–7.7)
Neutrophils Relative %: 66.5 % (ref 43.0–77.0)
Platelets: 294 10*3/uL (ref 150.0–400.0)
RBC: 4.7 Mil/uL (ref 3.87–5.11)
RDW: 13.4 % (ref 11.5–15.5)
WBC: 7.9 10*3/uL (ref 4.0–10.5)

## 2021-07-09 LAB — CK: Total CK: 50 U/L (ref 7–177)

## 2021-07-09 LAB — TSH: TSH: 1.16 u[IU]/mL (ref 0.35–5.50)

## 2021-07-09 NOTE — Progress Notes (Signed)
[facility] [address] [place] [address] Dept: [phone] Dept Fax: [phone]  Office Visit  Subjective:    Patient ID: Nicole Mendoza, female    DOB: 1990-10-23, 31 y.o..   MRN: [id number]  Chief Complaint  Patient presents with   Acute Visit    C/o having fatigue/weakness.  Will be needing FMLA filled out so she may work from home due to the dizziness.     History of Present Illness:  Patient is in today for assessment of chronic fatigue and muscle pain. She relates these symptoms to occurring after a COVID-19 infection in April 2022. She has had diffuse myalgias and notes finding knots in her muscles which are painful. She also complains of a profound sense of fatigue throughout the day. She has a history of sleep apnea, but does not feel this involved in her fatigue. She has had titration of her CPAP and documentation of a lack of significant apneic spells.  Ms. Jett had a history of some tachycardic events, though apparently this has not met the criteria on further testing for POTS (which her sister has). She has a history of significant allergy issues and a specific antibody deficiency and is working with Dr. Ernst Bowler regarding overall management of this. Dr. Ernestina Patches is referring her to rheumatology for additional work-up.  Ms. Ost notes that she has missed some work more recently due to episodic viral illness that seems to exacerbate her myalgia and fatigue issues. Her work has questioned whether she might need to apply for FMLA. She has been working at home more recently and found that this allows her to manage with her fatigue better than when she was having to travel to various clients homes.  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   Recurrent sinusitis 11/09/2020   Pre-nodular edema of the vocal folds 11/09/2020   Laryngospasms 11/09/2020   ASCUS of cervix with negative high risk HPV 07/10/2020   Cardiac murmur 09/02/2019   Tachycardia 09/01/2019   Salivary stone 09/01/2019   Anaphylactic shock due to adverse food reaction 01/22/2019   Seasonal and perennial allergic rhinitis 01/22/2019   Morbid obesity with BMI of 40.0-44.9, adult ([place]) 10/28/2017   Moderate persistent asthma without complication 25/63/8937   Essential hypertension 04/22/2017   Dysmenorrhea 06/26/2016   Menorrhagia with regular cycle 06/26/2016   Gastroesophageal reflux disease 12/22/2015   Iron deficiency anemia 11/02/2014   Vitamin D deficiency 11/02/2014   Past Surgical History:  Procedure Laterality Date   NO PAST SURGERIES     Family History  Problem Relation Age of Onset   Allergic rhinitis Father    Asthma Father    Allergic rhinitis Sister    Asthma Sister    Angioedema Neg Hx    Atopy Neg Hx    Eczema Neg Hx    Immunodeficiency Neg Hx    Urticaria Neg Hx    Outpatient Medications Prior to Visit  Medication Sig Dispense Refill   Ascorbic Acid (SUPER C COMPLEX  PO)      ASMANEX, 120 METERED DOSES, 220 MCG/INH inhaler TAKE 2 PUFFS BY MOUTH TWICE A DAY 1 each 4   azithromycin (ZITHROMAX) 500 MG tablet TAKE 1 TABLET (500 MG TOTAL) BY MOUTH EVERY MONDAY, WEDNESDAY, AND FRIDAY. 12 tablet 11   budesonide (PULMICORT) 0.5 MG/2ML nebulizer solution Take 0.5 mg by nebulization as needed (shortness of breath and wheezing).     cetirizine (ZYRTEC) 10 MG tablet Take by mouth.     dexlansoprazole (DEXILANT) 60 MG capsule Take 1 capsule (60 mg  total) by mouth daily. 30 capsule 5   EPINEPHrine (AUVI-Q) 0.3 mg/0.3 mL IJ SOAJ injection Use as directed for severe allergic reactions 4 each 1   famotidine (PEPCID) 20 MG tablet TAKE 1 TABLET BY MOUTH TWICE A DAY 180 tablet 5   ferrous sulfate 325 (65 FE) MG tablet 3 (three) times a week.      Fluticasone Propionate (XHANCE) 93 MCG/ACT EXHU Place 2 sprays into both nostrils 2 (two) times daily. 32 mL 5   hydrOXYzine (ATARAX/VISTARIL) 25 MG tablet Take 1 tablet (25 mg total) by mouth 3 (three) times daily as needed. 30 tablet 1   levalbuterol (XOPENEX HFA) 45 MCG/ACT inhaler INHALE 2 PUFFS INTO THE LUNGS EVERY 6 HOURS AS NEEDED FOR WHEEZE 15 each 1   levalbuterol (XOPENEX) 1.25 MG/3ML nebulizer solution USE 1 VIAL BY NEBULIZATION EVERY 6 (SIX) HOURS AS NEEDED FOR WHEEZING. 90 mL 1   meclizine (ANTIVERT) 25 MG tablet Take 1 tablet (25 mg total) by mouth every 8 (eight) hours as needed for dizziness. 30 tablet 1   metoprolol succinate (TOPROL-XL) 100 MG 24 hr tablet Take 1 tablet (100 mg total) by mouth daily. Take with or immediately following a meal. 90 tablet 3   montelukast (SINGULAIR) 10 MG tablet TAKE 1 TABLET BY MOUTH EVERY DAY 90 tablet 1   norethindrone (MICRONOR) 0.35 MG tablet Take 0.35 mg by mouth.     nystatin (MYCOSTATIN) 100000 UNIT/ML suspension Take 5 mLs (500,000 Units total) by mouth 4 (four) times daily. 110 mL 0   ondansetron (ZOFRAN) 4 MG tablet Take 1 tablet (4 mg total) by mouth every 8 (eight) hours as needed for nausea or vomiting. 20 tablet 0   Prenatal Vit-Fe Fumarate-FA (MULTIVITAMIN-PRENATAL) 27-0.8 MG TABS tablet Take 1 tablet by mouth daily at 12 noon.     Probiotic Product (PHILLIPS COLON HEALTH) CAPS      sucralfate (CARAFATE) 1 g tablet Take 1 tablet (1 g total) by mouth 4 (four) times daily -  with meals and at bedtime. 120 tablet 3   No facility-administered medications prior to visit.   Allergies  Allergen Reactions   Budesonide-Formoterol Fumarate Other  (See Comments)    Causes Hypertension   Penicillins Hives   Raspberry Hives   Spiriva Respimat [Tiotropium Bromide Monohydrate] Other (See Comments)    Pt states medication causes dizziness   Albuterol Palpitations   Losartan Rash     Objective:   Today's Vitals   07/09/21 1105  BP: 136/80  Pulse: 95  Temp: 97.7 F (36.5 C)  TempSrc: Temporal  SpO2: 99%  Weight: 290 lb 3.2 oz (131.6 kg)  Height: 5' 8"  (1.727 m)   Body mass index is 44.12 kg/m.   General: Well developed, well nourished. No acute distress. Psych: Alert and oriented. Normal mood and affect.  Health Maintenance Due  Topic Date Due   Hepatitis C Screening  Never done   COVID-19 Vaccine (3 -  Pfizer risk series) 04/21/2020   INFLUENZA VACCINE  01/15/2021     Assessment & Plan:   1. Malaise and fatigue 2. Muscle pain 3. COVID-19 long hauler I will perform labs to assess. 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  She is engaged with Dr. Ernestina Patches and will be seeing rheumatology. We did discuss the possible overlap between ME and fibromylagia or myofascial pain syndrome. I encouraged her to try and exercise as much as tolerated. We discussed her work situation. I think FMLA may be premature, as we do not currently have a handle on the cause of her symptoms or her prognosis. I am willing to write a letter for her requesting reasonable accomodation to continue remote work from home.   - CBC with Differential/Platelet - TSH - Comprehensive metabolic panel - CK (Creatine Kinase)   [name], MD

## 2021-07-13 ENCOUNTER — Encounter: Payer: Self-pay | Admitting: Internal Medicine

## 2021-07-13 ENCOUNTER — Other Ambulatory Visit: Payer: Self-pay

## 2021-07-13 ENCOUNTER — Ambulatory Visit (INDEPENDENT_AMBULATORY_CARE_PROVIDER_SITE_OTHER): Payer: 59 | Admitting: Internal Medicine

## 2021-07-13 VITALS — BP 132/83 | HR 94 | Ht 68.0 in | Wt 290.0 lb

## 2021-07-13 DIAGNOSIS — D808 Other immunodeficiencies with predominantly antibody defects: Secondary | ICD-10-CM

## 2021-07-13 DIAGNOSIS — M797 Fibromyalgia: Secondary | ICD-10-CM | POA: Diagnosis not present

## 2021-07-13 DIAGNOSIS — R768 Other specified abnormal immunological findings in serum: Secondary | ICD-10-CM | POA: Diagnosis not present

## 2021-07-13 DIAGNOSIS — B999 Unspecified infectious disease: Secondary | ICD-10-CM | POA: Diagnosis not present

## 2021-07-13 NOTE — Progress Notes (Signed)
Office Visit Note  Patient: Nicole Mendoza             Date of Birth: 1991/03/27           MRN: [id number]             PCP: [name], MD Referring: Lorine Bears, NP Visit Date: 07/13/2021 Occupation: Nurse  Subjective:  No chief complaint on file.   History of Present Illness: Nicole Mendoza is a 31 y.o. female with a history of asthma, GERD, obesity, sialoliths, BPPV, and tachycardia here for second opinion and evaluation of myofascial pain and concern for sjogren syndrome. She has multiple symptoms with fatigue, body pains, dryness of eyes and mouth, thrush, irritable bowel syndrome, and severe asthma particularly since COVID infection in June 2020 several are worse. She saw Tana Coast with [facility] rheumatology in May 2021 did not identify any particular autoimmune disease diagnosis.  Pain is worse in her upper back and upper arms she does experience radiation of symptoms mostly related to pressure over the painful areas.  More recently she started to notice some pain in her bilateral knees also without associated changes.  She does not notice any particular joint swelling warmth or discoloration.  NSAIDs are partially helpful for her symptoms.  She had very good improvement when taking prednisone for her asthma related symptoms. She has severe generalized fatigue.  She has sleep apnea that is well controlled with current CPAP and based on her sleep monitoring with low events. Her eyes keep a burning or stinging irritated sensation no focal lesions.  She has persistent oral thrush and since she has been on the prophylactic azithromycin mostly. She feels her some swelling in the neck but never notices discrete lymph node enlargement. She gets rashes with flushing over the face neck and upper chest with no lesions. She feels her fingers and toes frequently become extremely cold without any visible discoloration does not have numbness or weakness in the affected areas. Another newer  problem is frequent dizziness symptoms.  Work-up for this has been mostly unremarkable.  The symptoms are not very consistent with rotational changes.  She did not tolerate use of oral antihistamines due to side effects.  She has been doing some Epley maneuvers at home that seem partially helpful.  She is working with Dr. Ernst Bowler for her more severe asthma exacerbation requiring multiple treatments including several rounds of steroids and taking low-dose prophylactic antibiotics.  Work-ups has also been concerning for specific immunodeficiency discussed plan for starting Hizentra treatment but not begun yet. She saw Dr. Caryl Comes for evaluation of possible POTS but did not find anything concerning for this.  Labs reviewed 10/2019 ANA 1:80 RNP, dsDNA, SM, Scl-70, centromere, SSA, SSB Abs neg  Activities of Daily Living:  Patient reports morning stiffness for 45-60 minutes.   Patient Reports nocturnal pain.  Difficulty dressing/grooming: Denies Difficulty climbing stairs: Denies Difficulty getting out of chair: Denies Difficulty using hands for taps, buttons, cutlery, and/or writing: Denies  Review of Systems  Constitutional:  Positive for fatigue.  HENT:  Positive for mouth sores, mouth dryness and nose dryness.   Eyes:  Positive for photophobia, pain and visual disturbance. Negative for itching and dryness.  Respiratory:  Positive for shortness of breath and difficulty breathing.   Cardiovascular:  Positive for palpitations. Negative for chest pain.  Gastrointestinal:  Positive for diarrhea. Negative for blood in stool and constipation.  Endocrine: Negative for increased urination.  Genitourinary:  Negative for difficulty urinating.  Musculoskeletal:  Positive for joint pain, joint pain, myalgias, morning stiffness, muscle tenderness and myalgias. Negative for joint swelling.  Skin:  Negative for color change, rash and redness.  Allergic/Immunologic: Positive for susceptible to infections.   Neurological:  Positive for dizziness, numbness and weakness. Negative for headaches and memory loss.  Hematological:  Negative for bruising/bleeding tendency.  Psychiatric/Behavioral:  Positive for decreased concentration. Negative for confusion.    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   Recurrent sinusitis 11/09/2020   Pre-nodular edema of the vocal folds 11/09/2020   Laryngospasms 11/09/2020   ASCUS of cervix with negative high risk HPV 07/10/2020   Cardiac murmur 09/02/2019   Tachycardia 09/01/2019   Salivary stone 09/01/2019   Anaphylactic shock due to adverse food reaction 01/22/2019   Seasonal and perennial allergic rhinitis 01/22/2019   Morbid obesity with BMI of 40.0-44.9, adult ([place]) 10/28/2017   Moderate persistent asthma without complication 24/58/0998   Essential hypertension 04/22/2017   Dysmenorrhea 06/26/2016   Menorrhagia with regular cycle 06/26/2016   Gastroesophageal reflux disease 12/22/2015   Iron deficiency anemia 11/02/2014   Vitamin D deficiency 11/02/2014    Past Medical History:  Diagnosis Date   Allergic rhinitis    Allergy    Angio-edema    Asthma    Fatty liver    GERD (gastroesophageal reflux disease)    Hypertension    Recurrent upper respiratory infection (URI)    Tachycardia     Family History  Problem Relation Age of Onset   Asthma Mother    Allergic rhinitis Father    Asthma Father     Diabetes Father    Allergic rhinitis Sister    Asthma Sister    Angioedema Neg Hx    Atopy Neg Hx    Eczema Neg Hx    Immunodeficiency Neg Hx    Urticaria Neg Hx    Past Surgical History:  Procedure Laterality Date   NO PAST SURGERIES     Social History   Social History Narrative   Not on file   Immunization History  Administered Date(s) Administered   Influenza Split 05/04/2014, 04/13/2015   Influenza-Unspecified 05/04/2014, 04/13/2015   PFIZER(Purple Top)SARS-COV-2 Vaccination 01/28/2020, 03/24/2020   PPD Test 04/14/2015, 04/08/2016   Pneumococcal Polysaccharide-23 05/12/2018   Tdap 05/11/2018   Tetanus 10/19/2008     Objective: Vital Signs: BP 132/83 (BP Location: Right Arm, Patient Position: Sitting, Cuff Size: Large)    Pulse 94    Ht 5\' 8"  (1.727 m)    Wt 290 lb (131.5 kg)    BMI 44.09 kg/m    Physical Exam Constitutional:      Appearance: She is obese.  Eyes:     Conjunctiva/sclera: Conjunctivae normal.  Cardiovascular:     Rate and Rhythm: Normal rate and regular rhythm.  Pulmonary:     Effort: Pulmonary effort is normal.     Breath sounds: Normal breath sounds.  Lymphadenopathy:     Cervical: No cervical  adenopathy.  Skin:    General: Skin is warm and dry.     Findings: Rash present. No lesion.     Comments: Tortuous nailfold capillary appearance, no hemorrhages, no digital pitting  Neurological:     Mental Status: She is alert.  Psychiatric:        Mood and Affect: Mood normal.     Musculoskeletal Exam:  Neck and shoulder full ROM intact some pain and tightness in upper back provoked with movement Tenderness to pressure on upper arm and in back worst at thoracic spine paraspinal muscles, some radiation of pain with pressure Elbows, wrists, and fingers full ROM no tenderness or swelling Lateral hip tenderness to palpation  Knees full ROM no tenderness or swelling Ankles full ROM no tenderness or swelling  Investigation: No additional  findings.  Imaging: No results found.  Recent Labs: Lab Results  Component Value Date   WBC 7.9 07/09/2021   HGB 13.1 07/09/2021   PLT 294.0 07/09/2021   NA 140 07/09/2021   K 3.8 07/09/2021   CL 106 07/09/2021   CO2 25 07/09/2021   GLUCOSE 103 (H) 07/09/2021   BUN 13 07/09/2021   CREATININE 0.65 07/09/2021   BILITOT 0.4 07/09/2021   ALKPHOS 70 07/09/2021   AST 20 07/09/2021   ALT 25 07/09/2021   PROT 7.5 07/09/2021   ALBUMIN 4.3 07/09/2021   CALCIUM 9.6 07/09/2021   GFRAA >60 03/13/2020    Speciality Comments: No specialty comments available.  Procedures:  No procedures performed Allergies: Budesonide-formoterol fumarate, Penicillins, Raspberry, Spiriva respimat [tiotropium bromide monohydrate], Albuterol, and Losartan   Assessment / Plan:     Visit Diagnoses: Positive ANA (antinuclear antibody)  Positive ANA without specific disease markers on previous workup almost 2 years ago. She has numerous symptoms although many are nonspecific. I will send her for AVISE CTD panel testing for this. Written orders and info provided. We can definitely plan to follow up to discuss direction based on result.  Fibromyalgia  She does currently meet criteria for fibromyalgia syndrome with generalized musculoskeletal pain, myofascial pain with tender points. New or exacerbation of dizziness, concentration difficulty, fatigue and, irritable bowel diarrhea predominant are also consistent with this process.  Evaluating from underlying causes and contributions as above.  Discussed briefly the concept of centralized pain or sensitization and that this is not a progressive or destructive condition.  Recommended she review online resources for self-management and no new medications during this time.  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  Currently on azithromycin prophylaxis.  Orders: No orders of the defined types were placed in this encounter.  No orders of the defined types were placed in this encounter.   Follow-Up Instructions: Return in about 24 days (around 08/06/2021) for New pt AVISE CTD/FMS f/u.   Collier Salina, MD  Note - This record has been created using [employer].  Chart creation errors have been sought, but may not always  have been located. Such creation errors do not reflect on  the standard of medical care.

## 2021-07-13 NOTE — Patient Instructions (Addendum)
- I would like to test for additional markers evaluating inflammatory disease causes based on your ongoing fatigue, rashes, infections, and other symptoms. This is a test panel looking at the ANA-related diseases and also several other markers. If anything specific can be identified this could guide more specific treatment.  - I do think you have symptoms consistent with fibromyalgia syndrome with the widespread muscle and joint pain, fatigue, IBS, dizziness, and concentration difficulties.  - I recommend checking out the [place] patient-centered guide for fibromyalgia and chronic pain management: https://www.olsen-oconnell.com/  Myofascial Pain Syndrome and Fibromyalgia Myofascial pain syndrome and fibromyalgia are both pain disorders. This pain may be felt mainly in your muscles. Myofascial pain syndrome: Always has tender points in the muscle that will cause pain when pressed (trigger points). The pain may come and go. Usually affects your neck, upper back, and shoulder areas. The pain often radiates into your arms and hands. Fibromyalgia: Has muscle pains and tenderness that come and go. Is often associated with fatigue and sleep problems. Has trigger points. Tends to be long-lasting (chronic), but is not life-threatening. Fibromyalgia and myofascial pain syndrome are not the same. However, they often occur together. If you have both conditions, each can make the other worse. Both are common and can cause enough pain and fatigue to make day-to-day activities difficult. Both can be hard to diagnose because their symptoms are common in many other conditions. What are the causes? The exact causes of these conditions are not known. What increases the risk? You are more likely to develop this condition if: You have a family history of the condition. You have certain triggers, such as: Spine disorders. An injury (trauma) or other physical stressors. Being under a lot of  stress. Medical conditions such as osteoarthritis, rheumatoid arthritis, or lupus. What are the signs or symptoms? Fibromyalgia The main symptom of fibromyalgia is widespread pain and tenderness in your muscles. Pain is sometimes described as stabbing, shooting, or burning. You may also have: Tingling or numbness. Sleep problems and fatigue. Problems with attention and concentration (fibro fog). Other symptoms may include:  Bowel and bladder problems. Headaches. Visual problems. Problems with odors and noises. Depression or mood changes. Painful menstrual periods (dysmenorrhea). Dry skin or eyes. These symptoms can vary over time. Myofascial pain syndrome Symptoms of myofascial pain syndrome include: Tight, ropy bands of muscle. Uncomfortable sensations in muscle areas. These may include aching, cramping, burning, numbness, tingling, and weakness. Difficulty moving certain parts of the body freely (poor range of motion). How is this diagnosed? This condition may be diagnosed by your symptoms and medical history. You will also have a physical exam. In general: Fibromyalgia is diagnosed if you have pain, fatigue, and other symptoms for more than 3 months, and symptoms cannot be explained by another condition. Myofascial pain syndrome is diagnosed if you have trigger points in your muscles, and those trigger points are tender and cause pain elsewhere in your body (referred pain). How is this treated? Treatment for these conditions depends on the type that you have. For fibromyalgia: Pain medicines, such as NSAIDs. Medicines for treating depression. Medicines for treating seizures. Medicines that relax the muscles. For myofascial pain: Pain medicines, such as NSAIDs. Cooling and stretching of muscles. Trigger point injections. Sound wave (ultrasound) treatments to stimulate muscles. Treating these conditions often requires a team of health care providers. These may include: Your  primary care provider. Physical therapist. Complementary health care providers, such as massage therapists or acupuncturists. Psychiatrist for  cognitive behavioral therapy. Follow these instructions at home: Medicines Take over-the-counter and prescription medicines only as told by your health care provider. Do not drive or use heavy machinery while taking prescription pain medicine. If you are taking prescription pain medicine, take actions to prevent or treat constipation. Your health care provider may recommend that you: Drink enough fluid to keep your urine pale yellow. Eat foods that are high in fiber, such as fresh fruits and vegetables, whole grains, and beans. Limit foods that are high in fat and processed sugars, such as fried or sweet foods. Take an over-the-counter or prescription medicine for constipation. Lifestyle  Exercise as directed by your health care provider or physical therapist. Practice relaxation techniques to control your stress. You may want to try: Biofeedback. Visual imagery. Hypnosis. Muscle relaxation. Yoga. Meditation. Maintain a healthy lifestyle. This includes eating a healthy diet and getting enough sleep. Do not use any products that contain nicotine or tobacco, such as cigarettes and e-cigarettes. If you need help quitting, ask your health care provider. General instructions Talk to your health care provider about complementary treatments, such as acupuncture or massage. Consider joining a support group with others who are diagnosed with this condition. Do not do activities that stress or strain your muscles. This includes repetitive motions and heavy lifting. Keep all follow-up visits as told by your health care provider. This is important. Where to find more information National Fibromyalgia Association: www.fmaware.[place]: www.arthritis.org American Chronic Pain Association: www.theacpa.org Contact a health care provider  if: You have new symptoms. Your symptoms get worse or your pain is severe. You have side effects from your medicines. You have trouble sleeping. Your condition is causing depression or anxiety. Summary Myofascial pain syndrome and fibromyalgia are pain disorders. Myofascial pain syndrome has tender points in the muscle that will cause pain when pressed (trigger points). Fibromyalgia also has muscle pains and tenderness that come and go, but this condition is often associated with fatigue and sleep disturbances. Fibromyalgia and myofascial pain syndrome are not the same but often occur together, causing pain and fatigue that make day-to-day activities difficult. Treatment for fibromyalgia includes taking medicines to relax the muscles and medicines for pain, depression, or seizures. Treatment for myofascial pain syndrome includes taking medicines for pain, cooling and stretching of muscles, and injecting medicines into trigger points. Follow your health care provider's instructions for taking medicines and maintaining a healthy lifestyle.

## 2021-07-16 ENCOUNTER — Encounter: Payer: Self-pay | Admitting: Internal Medicine

## 2021-07-27 ENCOUNTER — Telehealth: Payer: Self-pay

## 2021-07-27 NOTE — Telephone Encounter (Signed)
Lft VM that form has been completed for work and ready for pick up.  Dm/cma

## 2021-07-31 ENCOUNTER — Ambulatory Visit (INDEPENDENT_AMBULATORY_CARE_PROVIDER_SITE_OTHER): Payer: 59 | Admitting: Family Medicine

## 2021-07-31 ENCOUNTER — Other Ambulatory Visit: Payer: Self-pay

## 2021-07-31 VITALS — BP 136/86 | HR 98 | Temp 98.1°F | Resp 19 | Ht 68.0 in | Wt 289.0 lb

## 2021-07-31 DIAGNOSIS — T7800XD Anaphylactic reaction due to unspecified food, subsequent encounter: Secondary | ICD-10-CM

## 2021-07-31 DIAGNOSIS — J4540 Moderate persistent asthma, uncomplicated: Secondary | ICD-10-CM

## 2021-07-31 DIAGNOSIS — J3089 Other allergic rhinitis: Secondary | ICD-10-CM | POA: Diagnosis not present

## 2021-07-31 DIAGNOSIS — J302 Other seasonal allergic rhinitis: Secondary | ICD-10-CM

## 2021-07-31 DIAGNOSIS — K219 Gastro-esophageal reflux disease without esophagitis: Secondary | ICD-10-CM | POA: Diagnosis not present

## 2021-07-31 DIAGNOSIS — B999 Unspecified infectious disease: Secondary | ICD-10-CM | POA: Diagnosis not present

## 2021-07-31 DIAGNOSIS — H6982 Other specified disorders of Eustachian tube, left ear: Secondary | ICD-10-CM

## 2021-07-31 DIAGNOSIS — H6992 Unspecified Eustachian tube disorder, left ear: Secondary | ICD-10-CM

## 2021-07-31 NOTE — Patient Instructions (Signed)
Asthma - Continue montelukast 10 mg once a day to prevent cough or wheeze - Continue Asthmanex 220- 2 puffs twice a day to prevent cough or wheeze - Continue levalbuterol 2 puffs every 4 hours as needed for cough or wheeze OR Instead use levalbuterol solution via nebulizer one unit vial every 4-6 hours as needed for cough or wheeze - For asthma flares, continue Pulmicort 0.5 mg twice a day via nebulizer for 2 weeks or until cough and wheeze free.  Allergic rhinitis - Continue saline rinses several times a day - Try Xyzal 5 mg once a day as needed for a runny nose or itch. Remember to rotate to a different antihistamine about every 3 months. Some examples of over the counter antihistamines include Zyrtec (cetirizine), Xyzal (levocetirizine), Allegra (fexofenadine), and Claritin (loratidine).  - Continue Xhance 2 sprays in each nostril twice a day for nasal symptoms - Continue allergen avoidance measures directed toward grass pollen, weed pollen, tree pollen, and dust mite as listed below - Consider allergen immunotherapy if your allergy symptoms are not controlled with the treatment plan as listed above  Eustachian tube dysfunction Continue nasal saline rinses and Xhance twice a day Call the clinic if you develop a fever or if your symptoms worsen or do not improve   Recurrent infections - Continue to monitor frequency of infections.   Reflux - Continue dietary and lifestyle modifications as listed below - Continue Dexilant once a day to prevent reflux.  - Continue famotidine 20 mg twice a day to prevent reflux  Vocal cord dysfunction - Continue with speech therapy as you have been  Food allergy - Continue to avoid tree nuts.  In case of an allergic reaction, take Benadryl 50 mg every 4 hours, and if life-threatening symptoms occur, inject with AuviQ 0.3 mg. - Make an appointment for a mixed tree nut challenge.   Call the clinic if this treatment plan is not working well for  you.  Follow up in 1 month or sooner if needed.  Reducing Pollen Exposure The American Academy of Allergy, Asthma and Immunology suggests the following steps to reduce your exposure to pollen during allergy seasons. Do not hang sheets or clothing out to dry; pollen may collect on these items. Do not mow lawns or spend time around freshly cut grass; mowing stirs up pollen. Keep windows closed at night.  Keep car windows closed while driving. Minimize morning activities outdoors, a time when pollen counts are usually at their highest. Stay indoors as much as possible when pollen counts or humidity is high and on windy days when pollen tends to remain in the air longer. Use air conditioning when possible.  Many air conditioners have filters that trap the pollen spores. Use a HEPA room air filter to remove pollen form the indoor air you breathe.   Control of Dust Mite Allergen Dust mites play a major role in allergic asthma and rhinitis. They occur in environments with high humidity wherever human skin is found. Dust mites absorb humidity from the atmosphere (ie, they do not drink) and feed on organic matter (including shed human and animal skin). Dust mites are a microscopic type of insect that you cannot see with the naked eye. High levels of dust mites have been detected from mattresses, pillows, carpets, upholstered furniture, bed covers, clothes, soft toys and any woven material. The principal allergen of the dust mite is found in its feces. A gram of dust may contain 1,000 mites and 250,000 fecal particles.  Mite antigen is easily measured in the air during house cleaning activities. Dust mites do not bite and do not cause harm to humans, other than by triggering allergies/asthma.  Ways to decrease your exposure to dust mites in your home:  1. Encase mattresses, box springs and pillows with a mite-impermeable barrier or cover  2. Wash sheets, blankets and drapes weekly in hot water (130 F)  with detergent and dry them in a dryer on the hot setting.  3. Have the room cleaned frequently with a vacuum cleaner and a damp dust-mop. For carpeting or rugs, vacuuming with a vacuum cleaner equipped with a high-efficiency particulate air (HEPA) filter. The dust mite allergic individual should not be in a room which is being cleaned and should wait 1 hour after cleaning before going into the room.  4. Do not sleep on upholstered furniture (eg, couches).  5. If possible removing carpeting, upholstered furniture and drapery from the home is ideal. Horizontal blinds should be eliminated in the rooms where the person spends the most time (bedroom, study, television room). Washable vinyl, roller-type shades are optimal.  6. Remove all non-washable stuffed toys from the bedroom. Wash stuffed toys weekly like sheets and blankets above.  7. Reduce indoor humidity to less than 50%. Inexpensive humidity monitors can be purchased at most hardware stores. Do not use a humidifier as can make the problem worse and are not recommended.

## 2021-07-31 NOTE — Progress Notes (Signed)
[place] [id number] Dept: [phone]  FOLLOW UP NOTE  Patient ID: Nicole Mendoza, female    DOB: [birth year]  Age: 31 y.o. MRN: [id number] Date of Office Visit: 07/31/2021  Assessment  Chief Complaint: Follow-up (Pt states she have been dealing with post nasal drips for about a week, and it got worse over the weekend , she started coughing, she took tylenol cold and sinus,allegra and zyrtec.) and Asthma  HPI Nicole Mendoza is a 31 year old female who presents to the clinic for evaluation of nasal drainage and cough.  She was last seen in this clinic on 06/26/2021 by Gareth Morgan, for evaluation of vertigo, asthma, vocal cord dysfunction, recurrent infection, allergic rhinitis, reflux, food allergy to tree nut, snoring, and possible POTS.  At today's visit, she reports that about 2 weeks ago she stopped taking cetirizine as it was making her sleepy in the morning and began taking Allegra.  She reports at that point she began to experience an increase in postnasal drainage.  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  Last night, she reports that she had an increase in postnasal drainage while lying down and coughed up mucus this morning.  Her asthma is reported as moderately well controlled with symptoms including chest tightness occurring with the postnasal drainage and cough producing mucus that began this morning.  She continues montelukast 10 mg once a day, Asmanex 220-2 puffs twice a day, and rarely needs Xopenex for relief of asthma symptoms.  She is not currently using Incruse Ellipta.  She continues physical therapy exercises for control of vocal cord dysfunction.  She continues CPAP nightly.  Allergic rhinitis is reported as poorly controlled with recent rhinorrhea which is reported as slightly green tinge, nasal congestion, increased sneezing, and recent postnasal drainage.  She continues nasal saline rinses,  Xhance, and Allegra.  She does report some intermittent pressure in her left ear.  She denies pain, discharge, and change in hearing.  Her last environmental allergy skin testing was on 12/01/2017 was positive to grass pollen, weed pollen, tree pollen, and dust mites.  She reports that she has tried allergen immunotherapy 1 time after which she incidentally experienced an upper respiratory infection.  She is in moderately interested in restarting allergen immunotherapy at this time.  Reflux is reported as moderately well controlled with heartburn occurring 1 out of 7 days a week.  She reports this is an improvement over the last several weeks.  She continues Dexilant daily and famotidine twice a day.  Her current medications are listed in the chart.   Drug Allergies:  Allergies  Allergen Reactions   Budesonide-Formoterol Fumarate Other (See Comments)    Causes Hypertension   Penicillins Hives   Raspberry Hives   Spiriva Respimat [Tiotropium Bromide Monohydrate] Other (See Comments)    Pt states medication causes dizziness   Albuterol Palpitations   Losartan Rash    Physical Exam: BP 136/86    Pulse 98    Temp 98.1 F (36.7 C) (Temporal)    Resp 19    Ht 5\' 8"  (1.727 m)    Wt 289 lb (131.1 kg)    SpO2 100%    BMI 43.94 kg/m    Physical Exam Vitals reviewed.  Constitutional:      Appearance: Normal appearance.  HENT:     Head: Normocephalic and atraumatic.     Right Ear: Tympanic membrane normal.  Left Ear: Tympanic membrane normal.     Nose:     Comments: Bilateral nares slightly erythematous with clear nasal drainage noted.  Pharynx slightly erythematous with no exudate.  Ears normal.  Eyes normal.    Mouth/Throat:     Pharynx: Oropharynx is clear.  Eyes:     Conjunctiva/sclera: Conjunctivae normal.  Cardiovascular:     Rate and Rhythm: Normal rate and regular rhythm.     Heart sounds: Normal heart sounds. No murmur heard. Pulmonary:     Effort: Pulmonary effort is normal.      Breath sounds: Normal breath sounds.     Comments: Lungs clear to auscultation Musculoskeletal:        General: Normal range of motion.     Cervical back: Normal range of motion and neck supple.  Skin:    General: Skin is warm and dry.  Neurological:     Mental Status: She is alert and oriented to person, place, and time.  Psychiatric:        Mood and Affect: Mood normal.        Behavior: Behavior normal.        Thought Content: Thought content normal.        Judgment: Judgment normal.    Diagnostics: FVC 3.54, FEV1 2.76.  Predicted FVC 4.36, predicted FEV1 3.65.  Spirometry indicates normal ventilatory function.  Assessment and Plan: 1. Moderate persistent asthma without complication   2. Seasonal and perennial allergic rhinitis   3. Anaphylactic shock due to food, subsequent encounter   4. Gastroesophageal reflux disease, unspecified whether esophagitis present   5. Recurrent infections   6. Dysfunction of left eustachian tube     Meds ordered this encounter  Medications   levocetirizine (XYZAL) 5 MG tablet    Sig: Take 1 tablet (5 mg total) by mouth every evening.    Dispense:  30 tablet    Refill:  5    Patient Instructions  Asthma - Continue montelukast 10 mg once a day to prevent cough or wheeze - Continue Asthmanex 220- 2 puffs twice a day to prevent cough or wheeze - Continue levalbuterol 2 puffs every 4 hours as needed for cough or wheeze OR Instead use levalbuterol solution via nebulizer one unit vial every 4-6 hours as needed for cough or wheeze - For asthma flares, continue Pulmicort 0.5 mg twice a day via nebulizer for 2 weeks or until cough and wheeze free.  Allergic rhinitis - Continue saline rinses several times a day - Try Xyzal 5 mg once a day as needed for a runny nose or itch. Remember to rotate to a different antihistamine about every 3 months. Some examples of over the counter antihistamines include Zyrtec (cetirizine), Xyzal (levocetirizine),  Allegra (fexofenadine), and Claritin (loratidine).  - Continue Xhance 2 sprays in each nostril twice a day for nasal symptoms - Continue allergen avoidance measures directed toward grass pollen, weed pollen, tree pollen, and dust mite as listed below - Consider allergen immunotherapy if your allergy symptoms are not controlled with the treatment plan as listed above  Eustachian tube dysfunction Continue nasal saline rinses and Xhance twice a day Call the clinic if you develop a fever or if your symptoms worsen or do not improve   Recurrent infections - Continue to monitor frequency of infections.   Reflux - Continue dietary and lifestyle modifications as listed below - Continue Dexilant once a day to prevent reflux.  - Continue famotidine 20 mg twice a day to prevent  reflux  Vocal cord dysfunction - Continue with speech therapy as you have been  Food allergy - Continue to avoid tree nuts.  In case of an allergic reaction, take Benadryl 50 mg every 4 hours, and if life-threatening symptoms occur, inject with AuviQ 0.3 mg. - Make an appointment for a mixed tree nut challenge.   Call the clinic if this treatment plan is not working well for you.  Follow up in 1 month or sooner if needed.   Return in about 4 weeks (around 08/28/2021), or if symptoms worsen or fail to improve.    Thank you for the opportunity to care for this patient.  Please do not hesitate to contact me with questions.  Gareth Morgan, FNP [facility]

## 2021-08-01 ENCOUNTER — Ambulatory Visit (INDEPENDENT_AMBULATORY_CARE_PROVIDER_SITE_OTHER): Payer: 59 | Admitting: Allergy & Immunology

## 2021-08-01 ENCOUNTER — Encounter: Payer: Self-pay | Admitting: Allergy & Immunology

## 2021-08-01 ENCOUNTER — Encounter: Payer: Self-pay | Admitting: Family Medicine

## 2021-08-01 DIAGNOSIS — J4540 Moderate persistent asthma, uncomplicated: Secondary | ICD-10-CM

## 2021-08-01 DIAGNOSIS — J3089 Other allergic rhinitis: Secondary | ICD-10-CM | POA: Diagnosis not present

## 2021-08-01 DIAGNOSIS — K219 Gastro-esophageal reflux disease without esophagitis: Secondary | ICD-10-CM | POA: Diagnosis not present

## 2021-08-01 DIAGNOSIS — T7800XD Anaphylactic reaction due to unspecified food, subsequent encounter: Secondary | ICD-10-CM

## 2021-08-01 DIAGNOSIS — J302 Other seasonal allergic rhinitis: Secondary | ICD-10-CM

## 2021-08-01 DIAGNOSIS — J0100 Acute maxillary sinusitis, unspecified: Secondary | ICD-10-CM

## 2021-08-01 DIAGNOSIS — D808 Other immunodeficiencies with predominantly antibody defects: Secondary | ICD-10-CM | POA: Diagnosis not present

## 2021-08-01 MED ORDER — LEVOCETIRIZINE DIHYDROCHLORIDE 5 MG PO TABS: 5.0000 mg | ORAL_TABLET | Freq: Every evening | ORAL | 5 refills | Status: DC

## 2021-08-01 MED ORDER — CEFDINIR 300 MG PO CAPS: 300.0000 mg | ORAL_CAPSULE | Freq: Two times a day (BID) | ORAL | 0 refills | Status: AC

## 2021-08-01 NOTE — Progress Notes (Signed)
RE: Nicole Mendoza MRN: [id number] DOB: December 02, 1990 Date of Telemedicine Visit: 08/01/2021  Referring provider: [name], MD Primary care provider: [name], MD  Chief Complaint: Other (Bloody mucus, Patient gave verbal consent to treat and bill insurance for this visit.)   Telemedicine Follow Up Visit via Telephone: I connected with Nicole Mendoza for a follow up on 08/07/21 by telephone and verified that I am speaking with the correct person using two identifiers.   I discussed the limitations, risks, security and privacy concerns of performing an evaluation and management service by telephone and the availability of in person appointments. I also discussed with the patient that there may be a patient responsible charge related to this service. The patient expressed understanding and agreed to proceed.  Patient is at home.  Provider is at the office.  Visit start time: 11:40 AM Visit end time: 12:15 PM Insurance consent/check in by: [place] consent and medical assistant/nurse: Sharyn Lull  History of Present Illness:  She is a 31 y.o. 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 Her previous allergy office visit was in February 2023 with Gareth Morgan, FNP.  At that time, she presented for postnasal drip as well as cough.  She was endorsing rhinorrhea with a recent green tinge, nasal congestion, increased sneezing, and coughing from postnasal drip.  At the visit, she was started on Xyzal 5 mg daily and change of antihistamines as recommended intermittently to prevent tachyphylaxis.  She was continued on all of her asthma medications including Asmanex, Singulair, and Xopenex.  She also had Pulmicort to use during flares.  She was continued on azithromycin 3 times weekly for her specific antibody deficiency.  In the interim, she has continued to  decompensate. She started Xyzal last night. It took away the sneezing, but she did not notice as much sleepiness as with Zyrtec.   However, she is having a lot of nasal congestion. She is using her Truett Perna and it is just not coming out at all. It is green and blood tinged. She is having pressure above her eyes and her teeth. Symptoms worsened. She has had the PND for one week and the pressure since Sunday. She does not think that the cough and drainage is "deep in her lungs". She is not coughing all of the time but what is in her throat is not coming up. She is afraid to let this go.   Her last round of antibiotics (in addition to the azithromycin three times weekly) was a around 6 months ago. She did miss a dose of azithromycin on Friday, but otherwise she has been otherwise very compliant with her medication.  Regarding her immunoglobulin infusions, she has yet to start them. She has the first dose in her refrigerator, which she says it does not expire until 2024. She would like to go ahead and start it, but she really wants to get the first infusion in our office where we can monitor her. One of our medical assistance, [name], has been willing to go over there and be present during her infusion. She is still considering this.   Otherwise, there have been no changes to her past medical history, surgical history, family history, or social history.  Assessment and Plan:  [name] is a 31 y.o. female with:  Mild persistent asthma - now back to being well-controlled on Asmanex BID but complicated by vocal  cord dysfunction     Recurrent infections - improved following Pneumovax, but then worsened (streptococcal avidity assay abnormal), will be starting immunoglobulin therapy soon at 400 mg/kg/month equivalent   Seasonal and perennial allergic rhinitis (trees, weeds, grasses and dust mites) - not well controlled at all   Gastroesophageal reflux disease - on Dexilant (recently started) and famotidine and  PRN carafate    Adverse food reactions - resolved with minimizing exposure to food additives and avoiding peanuts/tree nuts   Snoring with perceived poor sleep quality - improved with use of the CPAP   Possible POTS syndrome    We are adding on cefdinir today.  We are also changing her to Allegra to see if that will have decreased sleepiness and better effectiveness.  She is open to this change.  I still think that she would do well with the Hizentra, however she needs more time to consider this. We are going to continue with the same medications otherwise. Hopefully she is turning a corner.   Diagnostics: None.  Medication List:  Current Outpatient Medications  Medication Sig Dispense Refill   Ascorbic Acid (SUPER C COMPLEX PO)      ASMANEX, 120 METERED DOSES, 220 MCG/INH inhaler TAKE 2 PUFFS BY MOUTH TWICE A DAY 1 each 4   budesonide (PULMICORT) 0.5 MG/2ML nebulizer solution Take 0.5 mg by nebulization as needed (shortness of breath and wheezing).     dexlansoprazole (DEXILANT) 60 MG capsule Take 1 capsule (60 mg total) by mouth daily. 30 capsule 5   EPINEPHrine (AUVI-Q) 0.3 mg/0.3 mL IJ SOAJ injection Use as directed for severe allergic reactions 4 each 1   famotidine (PEPCID) 20 MG tablet TAKE 1 TABLET BY MOUTH TWICE A DAY 180 tablet 5   ferrous sulfate 325 (65 FE) MG tablet 3 (three) times a week.      Fluticasone Propionate (XHANCE) 93 MCG/ACT EXHU Place 2 sprays into both nostrils 2 (two) times daily. 32 mL 5   hydrOXYzine (ATARAX/VISTARIL) 25 MG tablet Take 1 tablet (25 mg total) by mouth 3 (three) times daily as needed. 30 tablet 1   levalbuterol (XOPENEX HFA) 45 MCG/ACT inhaler INHALE 2 PUFFS INTO THE LUNGS EVERY 6 HOURS AS NEEDED FOR WHEEZE 15 each 1   levalbuterol (XOPENEX) 1.25 MG/3ML nebulizer solution USE 1 VIAL BY NEBULIZATION EVERY 6 (SIX) HOURS AS NEEDED FOR WHEEZING. 90 mL 1   levocetirizine (XYZAL) 5 MG tablet Take 1 tablet (5 mg total) by mouth every evening. 30  tablet 5   metoprolol succinate (TOPROL-XL) 100 MG 24 hr tablet Take 1 tablet (100 mg total) by mouth daily. Take with or immediately following a meal. 90 tablet 3   montelukast (SINGULAIR) 10 MG tablet TAKE 1 TABLET BY MOUTH EVERY DAY 90 tablet 1   norethindrone (MICRONOR) 0.35 MG tablet Take 0.35 mg by mouth.     nystatin (MYCOSTATIN) 100000 UNIT/ML suspension Take 5 mLs (500,000 Units total) by mouth 4 (four) times daily. 110 mL 0   ondansetron (ZOFRAN) 4 MG tablet Take 1 tablet (4 mg total) by mouth every 8 (eight) hours as needed for nausea or vomiting. 20 tablet 0   Prenatal Vit-Fe Fumarate-FA (MULTIVITAMIN-PRENATAL) 27-0.8 MG TABS tablet Take 1 tablet by mouth daily at 12 noon.     Probiotic Product (PHILLIPS COLON HEALTH) CAPS      cefdinir (OMNICEF) 300 MG capsule Take 1 capsule (300 mg total) by mouth 2 (two) times daily for 10 days. 20 capsule 0   sucralfate (  CARAFATE) 1 g tablet Take 1 tablet (1 g total) by mouth 4 (four) times daily -  with meals and at bedtime. 120 tablet 3   No current facility-administered medications for this visit.   Allergies: Allergies  Allergen Reactions   Budesonide-Formoterol Fumarate Other (See Comments)    Causes Hypertension   Penicillins Hives   Raspberry Hives   Spiriva Respimat [Tiotropium Bromide Monohydrate] Other (See Comments)    Pt states medication causes dizziness   Albuterol Palpitations   Losartan Rash   I reviewed her past medical history, social history, family history, and environmental history and no significant changes have been reported from previous visits.  Review of Systems  Constitutional:  Positive for fatigue and fever. Negative for activity change, appetite change, chills and diaphoresis.  HENT:  Positive for congestion, postnasal drip, rhinorrhea, sinus pressure and sore throat.   Eyes:  Negative for pain, discharge, redness and itching.  Respiratory:  Positive for wheezing. Negative for shortness of breath and  stridor.   Gastrointestinal:  Negative for diarrhea, nausea and vomiting.  Endocrine: Negative for cold intolerance and heat intolerance.  Musculoskeletal:  Negative for arthralgias, joint swelling and myalgias.  Skin:  Negative for rash.  Allergic/Immunologic: Negative for environmental allergies and food allergies.   Objective:  Physical exam not obtained as encounter was done via telephone.   Previous notes and tests were reviewed.  I discussed the assessment and treatment plan with the patient. The patient was provided an opportunity to ask questions and all were answered. The patient agreed with the plan and demonstrated an understanding of the instructions.   The patient was advised to call back or seek an in-person evaluation if the symptoms worsen or if the condition fails to improve as anticipated.  I provided 35 minutes of non-face-to-face time during this encounter.  It was my pleasure to participate in [facility] care today. Please feel free to contact me with any questions or concerns.   Sincerely,  Valentina Shaggy, MD

## 2021-08-06 ENCOUNTER — Ambulatory Visit: Payer: 59 | Admitting: Internal Medicine

## 2021-08-07 ENCOUNTER — Encounter: Payer: Self-pay | Admitting: Allergy & Immunology

## 2021-08-08 ENCOUNTER — Ambulatory Visit (INDEPENDENT_AMBULATORY_CARE_PROVIDER_SITE_OTHER): Payer: 59 | Admitting: Internal Medicine

## 2021-08-08 ENCOUNTER — Other Ambulatory Visit: Payer: Self-pay

## 2021-08-08 ENCOUNTER — Ambulatory Visit: Payer: 59 | Admitting: Internal Medicine

## 2021-08-08 ENCOUNTER — Encounter: Payer: Self-pay | Admitting: Internal Medicine

## 2021-08-08 VITALS — BP 143/84 | HR 92 | Resp 15 | Ht 68.0 in | Wt 293.0 lb

## 2021-08-08 DIAGNOSIS — R438 Other disturbances of smell and taste: Secondary | ICD-10-CM

## 2021-08-08 DIAGNOSIS — M797 Fibromyalgia: Secondary | ICD-10-CM

## 2021-08-08 DIAGNOSIS — R768 Other specified abnormal immunological findings in serum: Secondary | ICD-10-CM | POA: Diagnosis not present

## 2021-08-08 DIAGNOSIS — D808 Other immunodeficiencies with predominantly antibody defects: Secondary | ICD-10-CM

## 2021-08-08 DIAGNOSIS — U099 Post covid-19 condition, unspecified: Secondary | ICD-10-CM

## 2021-08-08 NOTE — Progress Notes (Signed)
Office Visit Note  Patient: Nicole Mendoza             Date of Birth: [birth year]           MRN: [id number]             PCP: [name], MD Referring: [name], MD Visit Date: 08/08/2021   Subjective:  Follow-up (Improving)   History of Present Illness: Nicole Mendoza is a 31 y.o. female here for follow up for multiple systemic symptoms with positive ANA also symptoms of fibromyalgia syndrome AVISE CTD lab panel obtained after last visit. This demonstrated the known positive ANA but all other antibody markers were completely negative. Her symptoms improved since last visit the generalized pain and sensitivity are less and no longer requiring daily aleve to function. She reports this has been the case with episodes coming and going with periods of severe symptoms alternating with feeling better for some times.  Previous HPI 07/13/21 Nicole Mendoza is a 31 y.o. female with a history of asthma, GERD, obesity, sialoliths, BPPV, and tachycardia here for second opinion and evaluation of myofascial pain and concern for sjogren syndrome. She has multiple symptoms with fatigue, body pains, dryness of eyes and mouth, thrush, irritable bowel syndrome, and severe asthma particularly since COVID infection in June 2020 several are worse. She saw Tana Coast with [facility] rheumatology in May 2021 did not identify any particular autoimmune disease diagnosis.  Pain is worse in her upper back and upper arms she does experience radiation of symptoms mostly related to pressure over the painful areas.  More recently she started to notice some pain in her bilateral knees also without associated changes.  She does not notice any particular joint swelling warmth or discoloration.  NSAIDs are partially helpful for her symptoms.  She had very good improvement when taking prednisone for her asthma related symptoms. She has severe generalized fatigue.  She has sleep apnea that is well controlled with current CPAP and  based on her sleep monitoring with low events. Her eyes keep a burning or stinging irritated sensation no focal lesions.  She has persistent oral thrush and since she has been on the prophylactic azithromycin mostly. She feels her some swelling in the neck but never notices discrete lymph node enlargement. She gets rashes with flushing over the face neck and upper chest with no lesions. She feels her fingers and toes frequently become extremely cold without any visible discoloration does not have numbness or weakness in the affected areas. Another newer problem is frequent dizziness symptoms.  Work-up for this has been mostly unremarkable.  The symptoms are not very consistent with rotational changes.  She did not tolerate use of oral antihistamines due to side effects.  She has been doing some Epley maneuvers at home that seem partially helpful.   She is working with Dr. Ernst Bowler for her more severe asthma exacerbation requiring multiple treatments including several rounds of steroids and taking low-dose prophylactic antibiotics.  Work-ups has also been concerning for specific immunodeficiency discussed plan for starting Hizentra treatment but not begun yet. She saw Dr. Caryl Comes for evaluation of possible POTS but did not find anything concerning for this.   Review of Systems  Constitutional:  Positive for fatigue.  HENT:  Positive for mouth dryness.   Eyes:  Positive for dryness.  Respiratory:  Positive for shortness of breath.   Cardiovascular:  Negative for swelling in legs/feet.  Gastrointestinal:  Positive for diarrhea.  Endocrine: Positive for cold  intolerance.  Genitourinary:  Negative for difficulty urinating.  Musculoskeletal:  Positive for myalgias, morning stiffness, muscle tenderness and myalgias.  Skin:  Negative for rash.  Allergic/Immunologic: Positive for susceptible to infections.  Neurological:  Negative for numbness.  Hematological:  Negative for bruising/bleeding tendency.   Psychiatric/Behavioral:  Negative for sleep disturbance.    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   Recurrent sinusitis 11/09/2020   Pre-nodular edema of the vocal folds 11/09/2020   Laryngospasms 11/09/2020   ASCUS of cervix with negative high risk HPV 07/10/2020   Cardiac murmur 09/02/2019   Tachycardia 09/01/2019   Salivary stone 09/01/2019   Anaphylactic shock due to adverse food reaction 01/22/2019   Seasonal and perennial allergic rhinitis 01/22/2019   Morbid obesity with BMI of 40.0-44.9, adult ([place]) 10/28/2017   Moderate persistent asthma without complication 63/87/5643   Essential hypertension 04/22/2017   Dysmenorrhea 06/26/2016   Menorrhagia with regular cycle 06/26/2016   Gastroesophageal reflux disease 12/22/2015   Iron deficiency anemia 11/02/2014   Vitamin D deficiency 11/02/2014    Past Medical History:  Diagnosis Date   Allergic rhinitis    Allergy    Angio-edema    Asthma    Fatty liver    GERD (gastroesophageal reflux disease)    Hypertension    Recurrent upper respiratory infection (URI)    Tachycardia     Family History  Problem Relation Age of Onset   Asthma Mother    Allergic rhinitis Father    Asthma Father    Diabetes Father    Allergic rhinitis Sister    Asthma Sister    Angioedema Neg Hx    Atopy Neg Hx    Eczema Neg Hx    Immunodeficiency Neg Hx    Urticaria Neg Hx    Past Surgical  History:  Procedure Laterality Date   NO PAST SURGERIES     Social History   Social History Narrative   Not on file   Immunization History  Administered Date(s) Administered   Influenza Split 05/04/2014, 04/13/2015   Influenza-Unspecified 05/04/2014, 04/13/2015   PFIZER(Purple Top)SARS-COV-2 Vaccination 01/28/2020, 03/24/2020   PPD Test 04/14/2015, 04/08/2016   Pneumococcal Polysaccharide-23 05/12/2018   Tdap 05/11/2018   Tetanus 10/19/2008     Objective: Vital Signs: BP (!) 143/84 (BP Location: Left Arm, Patient Position: Sitting, Cuff Size: Normal)    Pulse 92    Resp 15    Ht 5\' 8"  (1.727 m)    Wt 293 lb (132.9 kg)    BMI 44.55 kg/m    Physical Exam Constitutional:      Appearance: She is obese.  Skin:    General: Skin is warm and dry.  Neurological:     Mental Status: She is alert.  Psychiatric:        Mood and Affect: Mood normal.     Musculoskeletal Exam:  Shoulders full ROM no tenderness or swelling Elbows full ROM no tenderness or swelling Wrists full ROM no  tenderness or swelling Fingers full ROM no tenderness or swelling Knees full ROM no tenderness or swelling  Investigation: No additional findings.  Imaging: No results found.  Recent Labs: Lab Results  Component Value Date   WBC 7.9 07/09/2021   HGB 13.1 07/09/2021   PLT 294.0 07/09/2021   NA 140 07/09/2021   K 3.8 07/09/2021   CL 106 07/09/2021   CO2 25 07/09/2021   GLUCOSE 103 (H) 07/09/2021   BUN 13 07/09/2021   CREATININE 0.65 07/09/2021   BILITOT 0.4 07/09/2021   ALKPHOS 70 07/09/2021   AST 20 07/09/2021   ALT 25 07/09/2021   PROT 7.5 07/09/2021   ALBUMIN 4.3 07/09/2021   CALCIUM 9.6 07/09/2021   GFRAA >60 03/13/2020    Speciality Comments: No specialty comments available.  Procedures:  No procedures performed Allergies: Budesonide-formoterol fumarate, Penicillins, Raspberry, Spiriva respimat [tiotropium bromide monohydrate], Albuterol, and Losartan   Assessment / Plan:      Visit Diagnoses: Fibromyalgia  I believe her symptoms fit with fibromyalgia syndrome although doing better today overall. Discussed this is a condition often accompanying other health or pain problems and can be episodic over time. Not a progressive or debilitating disease state. Discussed if symptoms become more constant or intractable she could be a candidate for drugs such as cymbalta, lyrica, savella, neurontin, or elavil but none are disease modifying and not recommending at this time.  Positive ANA (antinuclear antibody)  Otherwise negative AV[facility] labs definitely no suspicion of lupus at this time. I do not think we need specific follow up.  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  Orders: No orders of the defined types were placed in this encounter.  No orders of the defined types were placed in this encounter.    Follow-Up Instructions: No follow-ups on file.   Collier Salina, MD  Note - This record has been created using [employer].  Chart creation errors have been sought, but may not always  have been located. Such creation errors do not reflect on  the standard of medical care.

## 2021-08-09 ENCOUNTER — Other Ambulatory Visit: Payer: Self-pay

## 2021-08-10 ENCOUNTER — Ambulatory Visit (INDEPENDENT_AMBULATORY_CARE_PROVIDER_SITE_OTHER): Payer: 59 | Admitting: Family Medicine

## 2021-08-10 VITALS — BP 124/72 | HR 90 | Temp 97.8°F | Ht 68.0 in | Wt 291.0 lb

## 2021-08-10 DIAGNOSIS — M797 Fibromyalgia: Secondary | ICD-10-CM

## 2021-08-10 DIAGNOSIS — J4540 Moderate persistent asthma, uncomplicated: Secondary | ICD-10-CM

## 2021-08-10 NOTE — Progress Notes (Signed)
[facility] [address] [place] [address] Dept: [phone] Dept Fax: [phone]  Chronic Care Office Visit  Subjective:    Patient ID: Nicole Mendoza, female    DOB: 1990-08-21, 31 y.o..   MRN: [id number]  Chief Complaint  Patient presents with   Follow-up    4 week f/u. Reports feeling better.  FMLA paperwork.     History of Present Illness:  Ms. Mcmenamin is in today to follow-up related to her assessment for fatigue. These symptoms seemed to be triggered after she had COVID in April 2022. I had performed a lab evaluation and referred her to rheumatology. [name] has determined that Ms. Mazor meets diagnostic criteria for fibromyalgia. She has had an improvement in her symptoms over the past 1-2 weeks. [name] had discussed with her about reserving medications should her symptoms become more intractable. I had completed paperwork requesting a reasonable accomodation for Ms. Hinchcliff related to her work. She notes this was approved. She finds the ability to do telework up to 75% of the time has allowed her to more effectively deal with her fatigue issues. She has FMLA paperwork with her today to complete.  Ms. Forlenza has a history of moderately, persistent asthma. Her symptoms have been more flared recently. She has a steroid course available, but does not want to use this if she doesn't need to. She does not have  [place] flow meter at home.   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   Recurrent sinusitis 11/09/2020   Pre-nodular edema of the vocal folds 11/09/2020   Laryngospasms 11/09/2020   ASCUS of cervix with negative high risk HPV 07/10/2020   Cardiac murmur 09/02/2019   Tachycardia 09/01/2019   Salivary stone 09/01/2019   Anaphylactic shock due to adverse food reaction 01/22/2019   Seasonal and perennial allergic rhinitis 01/22/2019   Morbid obesity with BMI of 40.0-44.9, adult ([place]) 10/28/2017   Moderate persistent asthma without complication 42/70/6237   Essential hypertension 04/22/2017   Dysmenorrhea 06/26/2016   Menorrhagia with regular cycle 06/26/2016   Gastroesophageal reflux disease 12/22/2015   Iron deficiency anemia 11/02/2014   Vitamin D deficiency 11/02/2014   Past Surgical History:  Procedure Laterality Date   NO PAST SURGERIES     Family History  Problem Relation Age of Onset   Asthma Mother    Allergic rhinitis Father    Asthma Father    Diabetes Father    Allergic rhinitis Sister    Asthma Sister    Angioedema Neg Hx    Atopy Neg Hx    Eczema Neg Hx    Immunodeficiency Neg Hx    Urticaria Neg Hx    Outpatient Medications Prior to Visit  Medication Sig Dispense Refill   Ascorbic Acid (SUPER C COMPLEX PO)      ASMANEX, 120 METERED DOSES, 220 MCG/INH inhaler TAKE 2 PUFFS BY MOUTH TWICE A DAY 1 each 4   azithromycin (  ZITHROMAX) 500 MG tablet Take by mouth.     budesonide (PULMICORT) 0.5 MG/2ML nebulizer solution Take 0.5 mg by nebulization as needed (shortness of breath and wheezing).     cefdinir (OMNICEF) 300 MG capsule Take 1 capsule (300 mg total) by mouth 2 (two) times daily for 10 days. 20 capsule 0   cetirizine (ZYRTEC) 10 MG tablet Take 10 mg by mouth daily.     dexlansoprazole (DEXILANT) 60 MG capsule Take 1 capsule (60 mg total) by mouth daily. 30 capsule 5   EPINEPHrine (AUVI-Q) 0.3 mg/0.3 mL IJ SOAJ  injection Use as directed for severe allergic reactions 4 each 1   famotidine (PEPCID) 20 MG tablet TAKE 1 TABLET BY MOUTH TWICE A DAY 180 tablet 5   ferrous sulfate 325 (65 FE) MG tablet 3 (three) times a week.      Fluticasone Propionate (XHANCE) 93 MCG/ACT EXHU Place 2 sprays into both nostrils 2 (two) times daily. 32 mL 5   hydrOXYzine (ATARAX/VISTARIL) 25 MG tablet Take 1 tablet (25 mg total) by mouth 3 (three) times daily as needed. 30 tablet 1   levalbuterol (XOPENEX HFA) 45 MCG/ACT inhaler INHALE 2 PUFFS INTO THE LUNGS EVERY 6 HOURS AS NEEDED FOR WHEEZE 15 each 1   levalbuterol (XOPENEX) 1.25 MG/3ML nebulizer solution USE 1 VIAL BY NEBULIZATION EVERY 6 (SIX) HOURS AS NEEDED FOR WHEEZING. 90 mL 1   levocetirizine (XYZAL) 5 MG tablet Take 1 tablet (5 mg total) by mouth every evening. 30 tablet 5   metoprolol succinate (TOPROL-XL) 100 MG 24 hr tablet Take 1 tablet (100 mg total) by mouth daily. Take with or immediately following a meal. 90 tablet 3   montelukast (SINGULAIR) 10 MG tablet TAKE 1 TABLET BY MOUTH EVERY DAY 90 tablet 1   norethindrone (MICRONOR) 0.35 MG tablet Take 0.35 mg by mouth.     nystatin (MYCOSTATIN) 100000 UNIT/ML suspension Take 5 mLs (500,000 Units total) by mouth 4 (four) times daily. 110 mL 0   ondansetron (ZOFRAN) 4 MG tablet Take 1 tablet (4 mg total) by mouth every 8 (eight) hours as needed for nausea or vomiting. 20 tablet 0   Prenatal Vit-Fe Fumarate-FA (MULTIVITAMIN-PRENATAL) 27-0.8 MG TABS tablet Take 1 tablet by mouth daily at 12 noon.     Probiotic Product (PHILL[facility] COLON HEALTH) CAPS      sucralfate (CARAFATE) 1 g tablet Take 1 tablet (1 g total) by mouth 4 (four) times daily -  with meals and at bedtime. 120 tablet 3   No facility-administered medications prior to visit.   Allergies  Allergen Reactions   Budesonide-Formoterol Fumarate Other (See Comments)    Causes Hypertension   Penicillins Hives   Raspberry Hives   Spiriva Respimat [Tiotropium  Bromide Monohydrate] Other (See Comments)    Pt states medication causes dizziness   Albuterol Palpitations   Losartan Rash      Objective:   Today's Vitals   08/10/21 1034  BP: 124/72  Pulse: 90  Temp: 97.8 F (36.6 C)  TempSrc: Temporal  SpO2: 99%  Weight: 291 lb (132 kg)  Height: 5\' 8"  (1.727 m)   Body mass index is 44.25 kg/m.   Gen: No acute distress. Lungs: Clear to auscultation bilaterally. No wheezing, rales or rhonchi. Psych: Alert and oriented. Normal mood and affect.  Peak expiratory flow: 425 lpm  Health Maintenance Due  Topic Date Due   Hepatitis C Screening  Never done     Lab results: CMP Latest Ref Rng &  Units 07/09/2021 05/09/2021 10/17/2020  Glucose 70 - 99 mg/dL 103(H) 95 96  BUN 6 - 23 mg/dL 13 11 12   Creatinine 0.40 - 1.20 mg/dL 0.65 0.69 0.89  Sodium 135 - 145 mEq/L 140 140 141  Potassium 3.5 - 5.1 mEq/L 3.8 4.4 3.8  Chloride 96 - 112 mEq/L 106 105 103  CO2 19 - 32 mEq/L 25 26 18(L)  Calcium 8.4 - 10.5 mg/dL 9.6 9.5 10.0  Total Protein 6.0 - 8.3 g/dL 7.5 - 7.3  Total Bilirubin 0.2 - 1.2 mg/dL 0.4 - 0.8  Alkaline Phos 39 - 117 U/L 70 - 92  AST 0 - 37 U/L 20 - 27  ALT 0 - 35 U/L 25 - 30   Lab Results  Component Value Date   TSH 1.16 07/09/2021   Lab Results  Component Value Date   WBC 7.9 07/09/2021   HGB 13.1 07/09/2021   HCT 40.0 07/09/2021   MCV 84.9 07/09/2021   PLT 294.0 07/09/2021   Component Ref Range & Units 1 mo ago (07/09/21) 1 mo ago (06/22/21)  Total CK 7 - 177 U/L 50  45    Assessment & Plan:   1. Fibromyalgia We discussed her recent diagnosis, pathophysiology and the concept of central sensitization, and management through lifestyle changes, including adequate rest and routine physical activity. I completed FMLA paperwork to allow for intermittent episodes of incapacity.   2. Moderate persistent asthma without complication Provided Ms. Luckenbaugh with a peak flow meter. Discussed set up based on personal best (predicted PF  is [phone]). Based on predicted values Her zones would be Green > 368, Yellow 230-368, Red <230. We discussed appropriate response based on her asthma zones.  Return in about 3 months (around 11/07/2021) for Reassessment.   [name], MD

## 2021-08-21 ENCOUNTER — Other Ambulatory Visit: Payer: Self-pay | Admitting: *Deleted

## 2021-08-21 DIAGNOSIS — J3089 Other allergic rhinitis: Secondary | ICD-10-CM

## 2021-08-21 DIAGNOSIS — J45901 Unspecified asthma with (acute) exacerbation: Secondary | ICD-10-CM

## 2021-08-21 MED ORDER — MONTELUKAST SODIUM 10 MG PO TABS: 10.0000 mg | ORAL_TABLET | Freq: Every day | ORAL | 1 refills | Status: DC

## 2021-08-27 ENCOUNTER — Other Ambulatory Visit: Payer: Self-pay | Admitting: Allergy & Immunology

## 2021-08-29 ENCOUNTER — Ambulatory Visit: Payer: 59 | Admitting: Internal Medicine

## 2021-09-20 ENCOUNTER — Ambulatory Visit: Payer: 59 | Admitting: Family Medicine

## 2021-09-30 ENCOUNTER — Other Ambulatory Visit: Payer: Self-pay | Admitting: Allergy & Immunology

## 2021-10-01 NOTE — Telephone Encounter (Signed)
Please advise to refill 

## 2021-10-01 NOTE — Telephone Encounter (Signed)
Please do not refill. Thank you ?

## 2021-10-04 ENCOUNTER — Ambulatory Visit (INDEPENDENT_AMBULATORY_CARE_PROVIDER_SITE_OTHER): Payer: 59 | Admitting: Allergy & Immunology

## 2021-10-04 ENCOUNTER — Encounter: Payer: Self-pay | Admitting: Allergy & Immunology

## 2021-10-04 VITALS — BP 146/72 | HR 97 | Temp 98.8°F | Resp 16 | Ht 68.0 in | Wt 295.8 lb

## 2021-10-04 DIAGNOSIS — J4540 Moderate persistent asthma, uncomplicated: Secondary | ICD-10-CM | POA: Diagnosis not present

## 2021-10-04 DIAGNOSIS — T7800XD Anaphylactic reaction due to unspecified food, subsequent encounter: Secondary | ICD-10-CM

## 2021-10-04 DIAGNOSIS — K219 Gastro-esophageal reflux disease without esophagitis: Secondary | ICD-10-CM

## 2021-10-04 DIAGNOSIS — D806 Antibody deficiency with near-normal immunoglobulins or with hyperimmunoglobulinemia: Secondary | ICD-10-CM

## 2021-10-04 DIAGNOSIS — J3089 Other allergic rhinitis: Secondary | ICD-10-CM

## 2021-10-04 DIAGNOSIS — H6982 Other specified disorders of Eustachian tube, left ear: Secondary | ICD-10-CM

## 2021-10-04 DIAGNOSIS — J302 Other seasonal allergic rhinitis: Secondary | ICD-10-CM

## 2021-10-04 MED ORDER — BUDESONIDE 0.5 MG/2ML IN SUSP
0.5000 mg | RESPIRATORY_TRACT | 1 refills | Status: DC | PRN
Start: 2021-10-04 — End: 2021-10-26

## 2021-10-04 MED ORDER — FAMOTIDINE 20 MG PO TABS: 20.0000 mg | ORAL_TABLET | Freq: Two times a day (BID) | ORAL | 1 refills | Status: DC

## 2021-10-04 MED ORDER — PREDNISONE 10 MG PO TABS: ORAL_TABLET | ORAL | 0 refills | Status: DC

## 2021-10-04 NOTE — Progress Notes (Signed)
? ?FOLLOW UP ? ?Date of Service/Encounter:  10/04/21 ? ? ?Assessment:  ? ?Mild persistent asthma with acute exacerbation with multiple adverse reactions to a number of inhalers ? ?Vocal cord dysfunction - graduated from speech therapy  ? ?Recurrent infections - improved following Pneumovax, but then worsened (streptococcal avidity assay abnormal) - needs to start Hizentra  ? ?Seasonal and perennial allergic rhinitis (trees, weeds, grasses and dust mites) - not well controlled  ?  ?Gastroesophageal reflux disease - on Dexilant (recently started) and famotidine and PRN carafate ?   ?Adverse food reactions - resolved with minimizing exposure to food additives and avoiding peanuts/tree nuts ?  ?Snoring with perceived poor sleep quality - improved with use of the CPAP ?  ?Possible POTS syndrome  ? ?Fibromyalgia - followed by [name] ?  ? ?Plan/Recommendations:  ? ?Asthma ?- Lung testing did look lower, so I definitely think that we need to get on top of this.  ?- We are going to start a prednisone burst: two tablets ('20mg'$ ) twice daily for three days, then one tablet ('10mg'$ ) twice daily for three days, then STOP. ?- I would also do the Pulmicort twice daily for 1-2 weeks.  ?- Daily controller medication(s): Singulair '10mg'$  daily ?- Prior to physical activity: Xopenex 2 puffs 10-15 minutes before physical activity. ?- Rescue medications: Xopenex 4 puffs every 4-6 hours as needed ?- Changes during respiratory infections or worsening symptoms: Add on Pulmicort 0.'5mg'$  to one treatment twice daily for TWO WEEKS. ?- Asthma control goals:  ?* Full participation in all desired activities (may need albuterol before activity) ?* Albuterol use two time or less a week on average (not counting use with activity) ?* Cough interfering with sleep two time or less a month ?* Oral steroids no more than once a year ?* No hospitalizations ? ?Allergic rhinitis ?- Continue saline rinses several times a day ?- Continue cetirizine 10 mg once a  day as needed for a runny nose or itch. ?- Continue Xhance 2 sprays in each nostril twice a day for nasal symptoms ? ?Recurrent infections ?- START YOUR HIZENTRA (we will plan for May 2023), as I think this is going to be better tolerated than the allergy shots.  ?- Continue to monitor frequency of infections.  ? ?Reflux ?- Continue dietary and lifestyle modifications as listed below ?- PLEASE CONTINUE Dexilant 60 mg once a day to prevent reflux.  ?- Continue famotidine 20 mg twice a day to prevent reflux ? ?Vocal cord dysfunction ?- Continue with speech therapy at home lessons as you are doing.  ? ?Food allergy ?- Continue to avoid tree nuts.  In case of an allergic reaction, take Benadryl 50 mg every 4 hours, and if life-threatening symptoms occur, inject with AuviQ 0.3 mg. ?- We will get that mixed tree nut challenge done at some point.  ? ?Return in about 3 months (around 01/03/2022).  ? ? ? ?Subjective:  ? ?Nicole Mendoza is a 31 y.o. female presenting today for follow up of  ?Chief Complaint  ?Patient presents with  ? Asthma  ?  Flare up  ? Allergic Rhinitis   ?  Flare up  ? ? ?Nicole Mendoza has a history of the following: ?Patient Active Problem List  ? Diagnosis Date Noted  ? Positive ANA (antinuclear antibody) 07/13/2021  ? Fibromyalgia 07/13/2021  ? Dysfunction of left eustachian tube 06/26/2021  ? Vertigo 06/26/2021  ? Obstructive sleep apnea 02/26/2021  ? Current use of beta blocker 12/20/2020  ?  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 Recurrent infections 12/20/2020  ? 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 Recurrent sinusitis 11/09/2020  ? Pre-nodular edema of the vocal folds 11/09/2020  ? Laryngospasms 11/09/2020  ? ASCUS of cervix with negative high risk HPV 07/10/2020  ? Cardiac murmur 09/02/2019  ? Tachycardia  09/01/2019  ? Salivary stone 09/01/2019  ? Anaphylactic shock due to adverse food reaction 01/22/2019  ? Seasonal and perennial allergic rhinitis 01/22/2019  ? Morbid obesity with BMI of 40.0-44.9, adult ([place]) 10/28/2017  ? Moderate persistent asthma without complication 94/70/9628  ? Essential hypertension 04/22/2017  ? Dysmenorrhea 06/26/2016  ? Menorrhagia with regular cycle 06/26/2016  ? Gastroesophageal reflux disease 12/22/2015  ? Iron deficiency anemia 11/02/2014  ? Vitamin D deficiency 11/02/2014  ? ? ?History obtained from: chart review and patient. ? ?Nicole Mendoza is a 31 y.o. female presenting for a follow up visit.  She is well-known to this practice.  She was last seen in February 2023 via televisit.  At that time, we added on cefdinir because she continued to have a lot of sinus pressure and postnasal drip despite the aggressive use of her antihistamines and nasal sprays.  We changed her to Allegra to see if this would help by decreasing her sleepiness.  We again discussed initiating Hizentra, which she has been approved for but has been nervous about starting. ? ?She started having allergy symptoms in hate beginning of the week. She has not been consistent with her azithromycin.  ? ?Asthma/Respiratory Symptom History: She had an asthma attack from her uncontrolled allergies.  She did not start having problems until last night. She called to make an appointment first thing this morning. She has prednisone at home but she did not use it. She thinks that she has a baby pack that she never needed from last time. She has been using Xopenex which does help. It allowed her to sleep and stopped coughing. She does have Pulmicort but this is crazy out of date. She had weird side effects to Spiriva and never tolerated the other inhalers. She adds on the Pulmicort when she is sick. She needs refills of this as hers have expired. She has not needed to use the Pulmicort in several months. Her last major issue with an  asthma exacerbation  ? ?Allergic Rhinitis Symptom History: She remains on her nasal saline rinses.  She is also doing cetirizine as well as Xhance.  She has been on allergy shots in the past, but had flulike symptoms for a few days after some injections.  They also made her allergies go haywire. ? ?Food Allergy Symptom History: She has not really been eating much in the way of peanuts. She is open to trying tree nuts. She had to cancel her last tree nut challenge visit because of a fibromyalgia flare.  He has just had peanuts and tree nuts out of her diet for so long that it did not really make sense for her to introduce them.  However, she has had less anxiety over cross-contamination. ? ?GERD Symptom History: She remains on Dexilant 60 mg.  He is also on famotidine. ? ?She is going to the [employer] from May 13th through the 20th.  She is willing to start her Hizentra after this.  Her friend [name] who is one  of our medical assistants is going to be at home with her as she starts this.  She is premedicated with Tylenol and Benadryl.  Her rheumatologist, [name], has agreed that the best way to go about with her treatment is to start the Hizentra. ? ?She has started therapy.  She goes to sessions every 2 weeks.  She said this has been very helpful for her as well. ? ?They got a foster kiddo who is 45 weeks old. They have had him for one month.  She is unsure if she is going to be keeping the baby, but apparently he has been fairly good and more on the lethargic side of withdrawal rather than the irritable side of withdrawal.  He was on morphine and methadone in the hospital, but he has never needed any while at her house.  They recently moved into a new house.  It is at 1000 square feet smaller than the previous one which has been rather liberating for her. ? ?Otherwise, there have been no changes to her past medical history, surgical history, family history, or social history. ? ? ? ?Review of Systems   ?Constitutional: Negative.  Negative for chills, fever, malaise/fatigue and weight loss.  ?HENT:  Positive for congestion and sinus pain. Negative for ear discharge and ear pain.   ?Eyes:  Negative for pain, discharge and

## 2021-10-04 NOTE — Patient Instructions (Addendum)
Asthma ?- Lung testing did look lower, so I definitely think that we need to get on top of this.  ?- We are going to start a prednisone burst: two tablets ('20mg'$ ) twice daily for three days, then one tablet ('10mg'$ ) twice daily for three days, then STOP. ?- I would also do the Pulmicort twice daily for 1-2 weeks.  ?- Daily controller medication(s): Singulair '10mg'$  daily ?- Prior to physical activity: Xopenex 2 puffs 10-15 minutes before physical activity. ?- Rescue medications: Xopenex 4 puffs every 4-6 hours as needed ?- Changes during respiratory infections or worsening symptoms: Add on Pulmicort 0.'5mg'$  to one treatment twice daily for TWO WEEKS. ?- Asthma control goals:  ?* Full participation in all desired activities (may need albuterol before activity) ?* Albuterol use two time or less a week on average (not counting use with activity) ?* Cough interfering with sleep two time or less a month ?* Oral steroids no more than once a year ?* No hospitalizations ? ?Allergic rhinitis ?- Continue saline rinses several times a day ?- Continue cetirizine 10 mg once a day as needed for a runny nose or itch. ?- Continue Xhance 2 sprays in each nostril twice a day for nasal symptoms ? ?Recurrent infections ?- START YOUR HIZENTRA (we will plan for May 2023), as I think this is going to be better tolerated than the allergy shots.  ?- Continue to monitor frequency of infections.  ? ?Reflux ?- Continue dietary and lifestyle modifications as listed below ?- PLEASE CONTINUE Dexilant 60 mg once a day to prevent reflux.  ?- Continue famotidine 20 mg twice a day to prevent reflux ? ?Vocal cord dysfunction ?- Continue with speech therapy at home lessons as you are doing.  ? ?Food allergy ?- Continue to avoid tree nuts.  In case of an allergic reaction, take Benadryl 50 mg every 4 hours, and if life-threatening symptoms occur, inject with AuviQ 0.3 mg. ?- We will get that mixed tree nut challenge done at some point.  ? ?Return in about 3  months (around 01/03/2022).  ? ? ?Please inform us of any Emergency Department visits, hospitalizations, or changes in symptoms. Call us before going to the ED for breathing or allergy symptoms since we might be able to fit you in for a sick visit. Feel free to contact us anytime with any questions, problems, or concerns. ? ?It was a pleasure to see you today! ? ?Websites that have reliable patient information: ?1. American Academy of Asthma, Allergy, and Immunology: www.aaaai.org ?2. Food Allergy Research and Education (FARE): foodallergy.org ?3. Mothers of Asthmatics: http://www.asthmacommunitynetwork.org ?4. [employer] of Allergy, Asthma, and Immunology: MonthlyElectricBill.co.uk ? ? ?COVID-19 Vaccine Information can be found at: ShippingScam.co.uk For questions related to vaccine distribution or appointments, please email [email or web address] or call [phone].  ? ?We realize that you might be concerned about having an allergic reaction to the COVID19 vaccines. To help with that concern, WE ARE OFFERING THE COVID19 VACCINES IN OUR OFFICE! Ask the front desk for dates!  ? ? ? ??Like? [place] on Facebook and Instagram for our latest updates!  ?  ? ? ?A healthy democracy works best when [employer] participate! Make sure you are registered to vote! If you have moved or changed any of your contact information, you will need to get this updated before voting! ? ?In some cases, you MAY be able to register to vote online: [email or web address] ? ? ? ? ? ? ? ? ? ?

## 2021-10-08 MED ORDER — CEFDINIR 300 MG PO CAPS
300.0000 mg | ORAL_CAPSULE | Freq: Two times a day (BID) | ORAL | 0 refills | Status: AC
Start: 2021-10-08 — End: 2021-10-18

## 2021-10-08 NOTE — Progress Notes (Signed)
Patient contacted me to let me know that her symptoms had not improved with OTC medications and aggressive nasal hygiene alone. Therefore I sent in cefdinir.  ? ?Salvatore Marvel, MD ?[facility] [place] [facility] ? ?

## 2021-10-08 NOTE — Addendum Note (Signed)
Addended by: Valentina Shaggy on: 10/08/2021 03:49 PM ? ? Modules accepted: Orders ? ?

## 2021-10-12 ENCOUNTER — Telehealth: Payer: Self-pay | Admitting: *Deleted

## 2021-10-12 NOTE — Telephone Encounter (Signed)
Called patient to advise approval and submit for Hizentra to [place]. Patient advised she does have a dose on hand and will advise [employer] of same when they reach out to schedule delivery and nursing ?

## 2021-10-12 NOTE — Telephone Encounter (Signed)
-----   Message from Valentina Shaggy, MD sent at 10/04/2021  1:09 PM EDT ----- ?She is going to start Hizentra in May per the patient... ?

## 2021-10-23 ENCOUNTER — Other Ambulatory Visit: Payer: Self-pay | Admitting: *Deleted

## 2021-10-23 MED ORDER — XHANCE 93 MCG/ACT NA EXHU: 2.0000 | INHALANT_SUSPENSION | Freq: Two times a day (BID) | NASAL | 5 refills | Status: DC

## 2021-10-26 ENCOUNTER — Other Ambulatory Visit: Payer: Self-pay | Admitting: Allergy & Immunology

## 2021-11-09 ENCOUNTER — Ambulatory Visit (INDEPENDENT_AMBULATORY_CARE_PROVIDER_SITE_OTHER): Payer: 59 | Admitting: Family Medicine

## 2021-11-09 ENCOUNTER — Other Ambulatory Visit: Payer: Self-pay | Admitting: Allergy & Immunology

## 2021-11-09 VITALS — BP 128/74 | HR 92 | Temp 98.0°F | Ht 68.0 in | Wt 303.0 lb

## 2021-11-09 DIAGNOSIS — B372 Candidiasis of skin and nail: Secondary | ICD-10-CM

## 2021-11-09 DIAGNOSIS — Z6841 Body Mass Index (BMI) 40.0 and over, adult: Secondary | ICD-10-CM

## 2021-11-09 DIAGNOSIS — M797 Fibromyalgia: Secondary | ICD-10-CM

## 2021-11-09 DIAGNOSIS — I10 Essential (primary) hypertension: Secondary | ICD-10-CM | POA: Diagnosis not present

## 2021-11-09 DIAGNOSIS — J4540 Moderate persistent asthma, uncomplicated: Secondary | ICD-10-CM | POA: Diagnosis not present

## 2021-11-09 MED ORDER — NYSTATIN 100000 UNIT/GM EX CREA: 1.0000 "application " | TOPICAL_CREAM | Freq: Two times a day (BID) | CUTANEOUS | 0 refills | Status: DC

## 2021-11-09 NOTE — Progress Notes (Signed)
[facility] [address] [place] [address] Dept: [phone] Dept Fax: [phone]  Chronic Care Office Visit  Subjective:    Patient ID: Nicole Mendoza, female    DOB: Sep 10, 1990, 31 y.o..   MRN: [id number]  Chief Complaint  Patient presents with   Follow-up    3 month f/u.  C/o weight management.    History of Present Illness:  Patient is in today for reassessment of chronic medical issues.  Nicole Mendoza has a history of hypertension. She is on metoprolol, but this was primarily prescribed for dealing with tachycardia issues that she developed in her early 61s (Possibly POTS disease).   Nicole Mendoza has moderate persistent asthma and does see Dr. Ernst Bowler (allergist) related to this. She is treated with a combination of therapies for her allergies and asthma. This includes Xopenox (inhaler and nebs), Asmanex, and Singulair. She also occasionally uses a budesonide neb. For her allergies, she also uses cetirizine, and a fluticasone nasal spray Truett Perna). She has PRN hydroxyzine for itching and occasioanl anxiety. She feels like her respiratory/allergy issues are stable currently.  Nicole Mendoza was evaluated by [name] (rheumatology) earlier this year and diagnosed with fibromyalgia. This remains stable currently. I had assisted her with requesting a reasonable accomodation. She finds the ability to do telework up to 75% of the time has allowed her to more effectively deal with her fatigue issues.   Nicole Mendoza notes a long term struggle with her weight. She has participated in Weight Watchers in the past, but finds working with the point system to be bothersome. She has been limited in her exercise due to her fibromyalgia and her asthma. She is walking out to her mailbox with her dog each day. It is about a block away taking about 5 min.  Nicole Mendoza also notes a rash under her right breast. She notes she has had recurrent yeast infections  in this area. These have responded to nystatin cream in the past.  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   Recurrent sinusitis 11/09/2020   Pre-nodular edema of the vocal folds 11/09/2020   Laryngospasms 11/09/2020   ASCUS of cervix with negative high risk HPV 07/10/2020   Cardiac murmur 09/02/2019   Tachycardia 09/01/2019   Salivary stone 09/01/2019   Anaphylactic shock due to adverse food reaction 01/22/2019   Seasonal and perennial allergic rhinitis 01/22/2019   Morbid obesity with BMI of 45.0-49.9, adult ([place]) 10/28/2017   Moderate persistent asthma without complication 79/48/0165   Essential hypertension 04/22/2017   Dysmenorrhea 06/26/2016   Menorrhagia with regular cycle 06/26/2016   Gastroesophageal reflux disease 12/22/2015   Iron deficiency anemia 11/02/2014   Vitamin D deficiency 11/02/2014   Past Surgical History:  Procedure Laterality Date   NO PAST SURGERIES     Family History  Problem Relation Age of Onset   Asthma Mother    Allergic rhinitis Father    Asthma Father  Diabetes Father    Allergic rhinitis Sister    Asthma Sister    Angioedema Neg Hx    Atopy Neg Hx    Eczema Neg Hx    Immunodeficiency Neg Hx    Urticaria Neg Hx    Outpatient Medications Prior to Visit  Medication Sig Dispense Refill   Ascorbic Acid (SUPER C COMPLEX PO)      ASMANEX, 120 METERED DOSES, 220 MCG/ACT inhaler INHALE 2  PUFFS INTO THE LUNGS TWICE A DAY 1 each 4   azithromycin (ZITHROMAX) 500 MG tablet Take by mouth.     budesonide (PULMICORT) 0.5 MG/2ML nebulizer solution TAKE 2 MLS (0.5 MG TOTAL) BY NEBULIZATION AS NEEDED (SHORTNESS OF BREATH AND WHEEZING). 60 mL 1   cetirizine (ZYRTEC) 10 MG tablet Take 10 mg by mouth daily.     dexlansoprazole (DEXILANT) 60 MG capsule Take 1 capsule (60 mg total) by mouth daily. 30 capsule 5   EPINEPHrine (AUVI-Q) 0.3 mg/0.3 mL IJ SOAJ injection Use as directed for severe allergic reactions 4 each 1   famotidine (PEPCID) 20 MG tablet Take 1 tablet (20 mg total) by mouth 2 (two) times daily. 180 tablet 1   ferrous sulfate 325 (65 FE) MG tablet 3 (three) times a week.      Fluticasone Propionate (XHANCE) 93 MCG/ACT EXHU Place 2 sprays into both nostrils 2 (two) times daily. 32 mL 5   hydrOXYzine (ATARAX/VISTARIL) 25 MG tablet Take 1 tablet (25 mg total) by mouth 3 (three) times daily as needed. 30 tablet 1   levalbuterol (XOPENEX HFA) 45 MCG/ACT inhaler INHALE 2 PUFFS INTO THE LUNGS EVERY 6 HOURS AS NEEDED FOR WHEEZE 15 each 1   levalbuterol (XOPENEX) 1.25 MG/3ML nebulizer solution USE 1 VIAL BY NEBULIZATION EVERY 6 (SIX) HOURS AS NEEDED FOR WHEEZING. 90 mL 1   metoprolol succinate (TOPROL-XL) 100 MG 24 hr tablet Take 1 tablet (100 mg total) by mouth daily. Take with or immediately following a meal. 90 tablet 3   montelukast (SINGULAIR) 10 MG tablet Take 1 tablet (10 mg total) by mouth daily. 90 tablet 1   norethindrone (MICRONOR) 0.35 MG tablet Take 0.35 mg by mouth.     ondansetron (ZOFRAN) 4 MG tablet Take 1 tablet (4 mg total) by mouth every 8 (eight) hours as needed for nausea or vomiting. 20 tablet 0   predniSONE (DELTASONE) 10 MG tablet Take two tablets ('20mg'$ ) twice daily for three days, then one tablet ('10mg'$ ) twice daily for three days, then STOP. 18 tablet 0   Prenatal Vit-Fe Fumarate-FA (MULTIVITAMIN-PRENATAL) 27-0.8 MG TABS tablet Take 1 tablet by mouth daily at 12  noon.     Probiotic Product (PHILLIPS COLON HEALTH) CAPS      sucralfate (CARAFATE) 1 g tablet Take 1 g by mouth 4 (four) times daily -  with meals and at bedtime.     HIZENTRA 4 GM/20ML SOLN Inject into the skin.     No facility-administered medications prior to visit.    Allergies  Allergen Reactions   Budesonide-Formoterol Fumarate Other (See Comments)    Causes Hypertension   Penicillins Hives   Raspberry Hives   Spiriva Respimat [Tiotropium Bromide Monohydrate] Other (See Comments)    Pt states medication causes dizziness   Albuterol Palpitations   Losartan Rash   Objective:   Today's Vitals   11/09/21 1010  BP: 128/74  Pulse: 92  Temp: 98 F (36.7 C)  TempSrc: Temporal  SpO2: 98%  Weight: (!) 303 lb (137.4 kg)  Height: '5\' 8"'$  (1.727 m)   Body mass index is 46.07 kg/m.   General: Well developed, well nourished. No acute distress. Skin: Warm and dry. There is a limited area of redness under the right breast. Neuro: CN II-XII intact. Normal sensation and DTR bilaterally. Psych: Alert and oriented. Normal mood and affect.  Health Maintenance Due  Topic Date Due   Hepatitis C Screening  Never done     Assessment & Plan:   1. Essential hypertension Blood pressure is at goal. Continue metoprolol 100 mg nightly.  2. Moderate persistent asthma without complication Stable on current regimen. She will continue to follow with Dr. Ernst Bowler.  3. Fibromyalgia Stable. Doing well with lifestyle modifications.  4. Candidal intertrigo I will prescribe nystatin, as this has apparently worked well in the past.  - nystatin cream (MYCOSTATIN); Apply 1 application. topically 2 (two) times daily.  Dispense: 30 g; Refill: 0  5. Morbid obesity with BMI of 45.0-49.9, adult ([place]) We discussed approaches to weight loss. Sustainable dietary changes and regular exercise would be the basis of any weight loss approach. I recommended she try and gradually increase her daily walking by  1 min daily each week, with a goal of 30 min a day. She is interested in working with the Medical Weight [facility] clinic.  - Amb Ref to Medical Weight Management   Return in about 4 months (around 03/12/2022).   [name], MD

## 2021-11-15 ENCOUNTER — Other Ambulatory Visit: Payer: Self-pay | Admitting: *Deleted

## 2021-11-15 MED ORDER — DEXLANSOPRAZOLE 60 MG PO CPDR: 60.0000 mg | DELAYED_RELEASE_CAPSULE | Freq: Every day | ORAL | 5 refills | Status: DC

## 2021-11-26 ENCOUNTER — Telehealth: Payer: Self-pay

## 2021-11-26 NOTE — Telephone Encounter (Signed)
Opened by accident

## 2021-11-27 ENCOUNTER — Ambulatory Visit: Payer: 59 | Admitting: Allergy & Immunology

## 2021-11-29 ENCOUNTER — Encounter: Payer: Self-pay | Admitting: Allergy & Immunology

## 2021-11-29 ENCOUNTER — Ambulatory Visit (INDEPENDENT_AMBULATORY_CARE_PROVIDER_SITE_OTHER): Payer: 59 | Admitting: Allergy & Immunology

## 2021-11-29 VITALS — BP 146/70 | HR 94 | Temp 98.1°F | Resp 16 | Ht 68.0 in | Wt 303.0 lb

## 2021-11-29 DIAGNOSIS — J302 Other seasonal allergic rhinitis: Secondary | ICD-10-CM

## 2021-11-29 DIAGNOSIS — K219 Gastro-esophageal reflux disease without esophagitis: Secondary | ICD-10-CM | POA: Diagnosis not present

## 2021-11-29 DIAGNOSIS — J3089 Other allergic rhinitis: Secondary | ICD-10-CM

## 2021-11-29 DIAGNOSIS — D806 Antibody deficiency with near-normal immunoglobulins or with hyperimmunoglobulinemia: Secondary | ICD-10-CM

## 2021-11-29 DIAGNOSIS — J4540 Moderate persistent asthma, uncomplicated: Secondary | ICD-10-CM

## 2021-11-29 DIAGNOSIS — T7800XD Anaphylactic reaction due to unspecified food, subsequent encounter: Secondary | ICD-10-CM

## 2021-11-29 NOTE — Patient Instructions (Signed)
Asthma - Lung testing looked great today.  - Daily controller medication(s): Singulair '10mg'$  daily and Asmanex 1-2 puffs twice daily  - Prior to physical activity: Xopenex 2 puffs 10-15 minutes before physical activity. - Rescue medications: Xopenex 4 puffs every 4-6 hours as needed - Changes during respiratory infections or worsening symptoms: Add on Pulmicort 0.'5mg'$  to one treatment twice daily for TWO WEEKS. - Asthma control goals:  * Full participation in all desired activities (may need albuterol before activity) * Albuterol use two time or less a week on average (not counting use with activity) * Cough interfering with sleep two time or less a month * Oral steroids no more than once a year * No hospitalizations  Allergic rhinitis - Continue saline rinses several times a day - Continue cetirizine 10 mg once a day as needed for a runny nose or itch. - Continue Xhance 2 sprays in each nostril twice a day for nasal symptoms  Recurrent infections - START YOUR HIZENTRA. - Call to see if the new company will do this in our office. - You can always come to [place].  Reflux - Continue dietary and lifestyle modifications as listed below - PLEASE CONTINUE Dexilant 60 mg once a day to prevent reflux.  - Continue famotidine 20 mg twice a day to prevent reflux  Vocal cord dysfunction - Continue with speech therapy at home lessons as you are doing.   Food allergy - Continue to avoid tree nuts.  In case of an allergic reaction, take Benadryl 50 mg every 4 hours, and if life-threatening symptoms occur, inject with AuviQ 0.3 mg. - We will get that mixed tree nut challenge done at some point.   Return in about 4 weeks (around 12/27/2021) for [place] (or 6 months for a regular follow up)..    Please inform us of any Emergency Department visits, hospitalizations, or changes in symptoms. Call us before going to the ED for breathing or allergy symptoms since we might be able to fit you  in for a sick visit. Feel free to contact us anytime with any questions, problems, or concerns.  It was a pleasure to see you today!  Websites that have reliable patient information: 1. American Academy of Asthma, Allergy, and Immunology: www.aaaai.org 2. Food Allergy Research and Education (FARE): foodallergy.org 3. Mothers of Asthmatics: http://www.asthmacommunitynetwork.org 4. American College of Allergy, Asthma, and Immunology: www.acaai.org   COVID-19 Vaccine Information can be found at: ShippingScam.co.uk For questions related to vaccine distribution or appointments, please email [email or web address] or call [phone].   We realize that you might be concerned about having an allergic reaction to the COVID19 vaccines. To help with that concern, WE ARE OFFERING THE COVID19 VACCINES IN OUR OFFICE! Ask the front desk for dates!     "Like" [place] on Facebook and Instagram for our latest updates!      A healthy democracy works best when [employer] participate! Make sure you are registered to vote! If you have moved or changed any of your contact information, you will need to get this updated before voting!  In some cases, you MAY be able to register to vote online: [email or web address]

## 2021-11-29 NOTE — Progress Notes (Unsigned)
FOLLOW UP  Date of Service/Encounter:  11/29/21   Assessment:   Mild persistent asthma with acute exacerbation with multiple adverse reactions to a number of inhalers   Vocal cord dysfunction - graduated from speech therapy    Recurrent infections - improved following Pneumovax, but then worsened (streptococcal avidity assay abnormal) - needs to start Hizentra    Seasonal and perennial allergic rhinitis (trees, weeds, grasses and dust mites) - not well controlled    Gastroesophageal reflux disease - on Dexilant (recently started) and famotidine and PRN carafate    Adverse food reactions - resolved with minimizing exposure to food additives and avoiding peanuts/tree nuts   Snoring with perceived poor sleep quality - improved with use of the CPAP   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    Ms. Armstead presents for a follow up visit. She is doing very well overall, although I think that she would do better with the infusions of Hizentra. She hems and haws about starting it. She would like to get it done in our clinic, and I told her that if the infusion company would be willing to administer it in our clinic, I would be happy to do it. We could do it in [place] since there are more rooms there. She is going to check on if this is an option.   Plan/Recommendations:   Asthma - Lung testing looked great today.  - Daily controller medication(s): Singulair '10mg'$  daily and Asmanex 1-2 puffs twice daily  - Prior to physical activity: Xopenex 2 puffs 10-15 minutes before physical activity. - Rescue medications: Xopenex 4 puffs every 4-6 hours as needed - Changes during respiratory infections or worsening symptoms: Add on Pulmicort 0.'5mg'$  to one treatment twice daily for TWO WEEKS. - Asthma control goals:  * Full participation in all desired activities (may need albuterol before activity) * Albuterol use two time or less a week on average (not counting use with  activity) * Cough interfering with sleep two time or less a month * Oral steroids no more than once a year * No hospitalizations  Allergic rhinitis - Continue saline rinses several times a day - Continue cetirizine 10 mg once a day as needed for a runny nose or itch. - Continue Xhance 2 sprays in each nostril twice a day for nasal symptoms  Recurrent infections - START YOUR HIZENTRA. - Call to see if the new company will do this in our office. - You can always come to [place].  Reflux - Continue dietary and lifestyle modifications as listed below - PLEASE CONTINUE Dexilant 60 mg once a day to prevent reflux.  - Continue famotidine 20 mg twice a day to prevent reflux  Vocal cord dysfunction - Continue with speech therapy at home lessons as you are doing.   Food allergy - Continue to avoid tree nuts.  In case of an allergic reaction, take Benadryl 50 mg every 4 hours, and if life-threatening symptoms occur, inject with AuviQ 0.3 mg. - We will get that mixed tree nut challenge done at some point.   Return in about 4 weeks (around 12/27/2021) for [place] (or 6 months for a regular follow up)..      Subjective:   Nicole Mendoza is a 31 y.o. female presenting today for follow up of  Chief Complaint  Patient presents with   Asthma    Pretty decent per patient. No episodes since last visit.   Allergic Rhinitis  Seasonal. Not taking anything extra for it. Does a nasal rinse at night and seems to be fine. However, she has watery, itchy eyes.    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   Recurrent sinusitis 11/09/2020   Pre-nodular edema of the vocal folds 11/09/2020   Laryngospasms 11/09/2020   ASCUS of cervix with negative high risk HPV 07/10/2020   Cardiac murmur 09/02/2019   Tachycardia 09/01/2019   Salivary stone 09/01/2019   Anaphylactic shock due to adverse food reaction 01/22/2019   Seasonal and perennial allergic rhinitis 01/22/2019   Morbid obesity with BMI of 45.0-49.9, adult ([place]) 10/28/2017   Moderate persistent asthma without complication 96/28/3662   Essential hypertension 04/22/2017   Dysmenorrhea 06/26/2016   Menorrhagia with regular cycle 06/26/2016   Gastroesophageal reflux disease 12/22/2015   Iron deficiency anemia 11/02/2014   Vitamin D deficiency 11/02/2014    History obtained from: chart review and patient.  Nicole Mendoza is a 31 y.o. female presenting for a follow up visit.  Seen in April 2023.  At that time, her lung testing looked lower, so we did end up starting a prednisone burst.  We continued with Singulair and Asmanex daily as well as Xopenex and Pulmicort as needed.  For her allergic rhinitis, we continued with cetirizine and Xhance.  For her recurrent infections, we recommended that she start her Hizentra.  She has been very nervous about this in the past.  For her reflux, we continued Dexilant 60 mg once a day as well as famotidine 20 mg twice a day.  Patient for vocal cord dysfunction, we continued with speech therapy at home. She continued to avoid tree nuts (she had previously passed a peanut challenge without a problem.  After the last visit, she contacted me and told me that her symptoms had not cleared with OTC treatments. Therefore I sent in cefdinir.   Asthma/Respiratory Symptom History: She remains on her Asmanex and Singulair. She reports  that a peak flow meter which has helped her. She uses this to distinguish between what is going on her throat. If it is normal, she will go to the vocal cord issue.  This has proven helpful in figuring out where her SOB was coming from.   Allergic Rhinitis Symptom History: She remains on the cetirizine and the Xhance. This is working well to control her symptoms. She did have the cefdinir after the last visit, but otherwise she has not needed any antibiotics at all.   Food Allergy Symptom History: She continues to avoid tree nuts. She remains interested in doing a mixed tree nut butter challenge.  She has not needed to use her EpiPen at all.   She has talked about using Cymbalta for pain control if her fibromyalgia flares up. She currently treats this with massages intermittently. She gets some blurry vision with her fibromyalgia as well. She has an accomodation for work. 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 This has helped her to not miss work.   She has not started her infusions yet. I sent in cefdinir in April 2023. She did not improved. She does azithromycin on M/W/F and then she takes Diflucan every 14 days or so. This is not consistent but she does take Diflucan at least once per month. She is very nervous about starting her Hizentra.   Otherwise, there have been no changes to her past medical history, surgical history, family history, or social history.    Review of Systems  Constitutional: Negative.  Negative for chills, fever, malaise/fatigue and weight loss.  HENT: Negative.  Negative for congestion, ear discharge, ear pain and sinus pain.   Eyes:  Negative for pain, discharge and redness.  Respiratory:  Negative for cough, sputum production, shortness of breath and wheezing.   Cardiovascular: Negative.  Negative for chest pain and palpitations.  Gastrointestinal:  Negative for abdominal pain, constipation, diarrhea, heartburn, nausea and vomiting.  Skin: Negative.   Negative for itching and rash.  Neurological:  Negative for dizziness and headaches.  Endo/Heme/Allergies:  Negative for environmental allergies. Does not bruise/bleed easily.       Objective:   Blood pressure (!) 146/70, pulse 94, temperature 98.1 F (36.7 C), temperature source Temporal, resp. rate 16, height '5\' 8"'$  (1.727 m), weight (!) 303 lb (137.4 kg), SpO2 96 %. Body mass index is 46.07 kg/m.    Physical Exam Vitals reviewed.  Constitutional:      Appearance: She is well-developed. She is obese.     Comments: Talkative as always.   HENT:     Head: Normocephalic and atraumatic.     Right Ear: Tympanic membrane, ear canal and external ear normal.     Left Ear: Tympanic membrane, ear canal and external ear normal.     Nose: No nasal deformity, septal deviation, mucosal edema or rhinorrhea.     Right Turbinates: Enlarged, swollen and pale.     Left Turbinates: Enlarged, swollen and pale.     Right Sinus: No maxillary sinus tenderness or frontal sinus tenderness.     Left Sinus: No maxillary sinus tenderness or frontal sinus tenderness.     Mouth/Throat:     Mouth: Mucous membranes are not pale and not dry.     Pharynx: Uvula midline. No pharyngeal swelling or oropharyngeal exudate.     Tonsils: 2+ on the right. 2+ on the left.  Eyes:     General: Lids are normal. No allergic shiner.       Right eye: No discharge.        Left eye: No discharge.     Conjunctiva/sclera:     Right eye: Right conjunctiva is injected. Chemosis present.     Left eye: Left conjunctiva is injected. Chemosis present.     Pupils: Pupils are equal, round, and reactive to light.  Cardiovascular:     Rate and Rhythm: Normal rate and regular rhythm.     Heart sounds: Normal heart sounds.  Pulmonary:     Effort: Pulmonary effort is normal. No tachypnea, accessory muscle usage or respiratory distress.     Breath sounds: Normal breath sounds. No wheezing, rhonchi or rales.     Comments: Moving air  well in all lung fields.  No increased work of breathing. Chest:     Chest wall: No tenderness.  Lymphadenopathy:     Cervical: No cervical adenopathy.  Skin:    General: Skin is warm.  Capillary Refill: Capillary refill takes less than 2 seconds.     Coloration: Skin is not pale.     Findings: No abrasion, erythema, petechiae or rash. Rash is not papular, urticarial or vesicular.     Comments: No eczematous or urticarial lesions noted.  Neurological:     Mental Status: She is alert.  Psychiatric:        Behavior: Behavior is cooperative.      Diagnostic studies:    Spirometry: results normal (FEV1: 2.74/76%, FVC: 3.34/78%, FEV1/FVC: 82%).    Spirometry consistent with normal pattern.   Allergy Studies: none        Salvatore Marvel, MD  [facility]

## 2021-12-05 ENCOUNTER — Encounter (INDEPENDENT_AMBULATORY_CARE_PROVIDER_SITE_OTHER): Payer: Self-pay

## 2021-12-05 DIAGNOSIS — Z0289 Encounter for other administrative examinations: Secondary | ICD-10-CM

## 2021-12-24 ENCOUNTER — Telehealth: Payer: Self-pay | Admitting: Family Medicine

## 2021-12-26 ENCOUNTER — Encounter (INDEPENDENT_AMBULATORY_CARE_PROVIDER_SITE_OTHER): Payer: Self-pay | Admitting: Family Medicine

## 2021-12-26 ENCOUNTER — Ambulatory Visit (INDEPENDENT_AMBULATORY_CARE_PROVIDER_SITE_OTHER): Payer: 59 | Admitting: Family Medicine

## 2021-12-26 VITALS — BP 137/87 | HR 75 | Temp 97.6°F | Ht 68.0 in | Wt 301.0 lb

## 2021-12-26 DIAGNOSIS — I10 Essential (primary) hypertension: Secondary | ICD-10-CM

## 2021-12-26 DIAGNOSIS — R5383 Other fatigue: Secondary | ICD-10-CM

## 2021-12-26 DIAGNOSIS — R0602 Shortness of breath: Secondary | ICD-10-CM | POA: Diagnosis not present

## 2021-12-26 DIAGNOSIS — F5089 Other specified eating disorder: Secondary | ICD-10-CM

## 2021-12-26 DIAGNOSIS — E559 Vitamin D deficiency, unspecified: Secondary | ICD-10-CM | POA: Diagnosis not present

## 2021-12-26 DIAGNOSIS — Z1331 Encounter for screening for depression: Secondary | ICD-10-CM

## 2021-12-26 DIAGNOSIS — D649 Anemia, unspecified: Secondary | ICD-10-CM

## 2021-12-26 DIAGNOSIS — Z9189 Other specified personal risk factors, not elsewhere classified: Secondary | ICD-10-CM

## 2021-12-26 DIAGNOSIS — Z6841 Body Mass Index (BMI) 40.0 and over, adult: Secondary | ICD-10-CM

## 2021-12-26 DIAGNOSIS — K760 Fatty (change of) liver, not elsewhere classified: Secondary | ICD-10-CM

## 2021-12-27 LAB — CBC WITH DIFFERENTIAL/PLATELET
Basophils Absolute: 0.1 10*3/uL (ref 0.0–0.2)
Basos: 1 %
EOS (ABSOLUTE): 0.1 10*3/uL (ref 0.0–0.4)
Eos: 1 %
Hematocrit: 42 % (ref 34.0–46.6)
Hemoglobin: 13.6 g/dL (ref 11.1–15.9)
Immature Grans (Abs): 0 10*3/uL (ref 0.0–0.1)
Immature Granulocytes: 0 %
Lymphocytes Absolute: 1.9 10*3/uL (ref 0.7–3.1)
Lymphs: 23 %
MCH: 27.9 pg (ref 26.6–33.0)
MCHC: 32.4 g/dL (ref 31.5–35.7)
MCV: 86 fL (ref 79–97)
Monocytes Absolute: 0.5 10*3/uL (ref 0.1–0.9)
Monocytes: 6 %
Neutrophils Absolute: 5.7 10*3/uL (ref 1.4–7.0)
Neutrophils: 69 %
Platelets: 291 10*3/uL (ref 150–450)
RBC: 4.88 x10E6/uL (ref 3.77–5.28)
RDW: 13.1 % (ref 11.7–15.4)
WBC: 8.3 10*3/uL (ref 3.4–10.8)

## 2021-12-27 LAB — HEMOGLOBIN A1C
Est. average glucose Bld gHb Est-mCnc: 111 mg/dL
Hgb A1c MFr Bld: 5.5 % (ref 4.8–5.6)

## 2021-12-27 LAB — VITAMIN B12: Vitamin B-12: 702 pg/mL (ref 232–1245)

## 2021-12-27 LAB — TSH: TSH: 2.02 u[IU]/mL (ref 0.450–4.500)

## 2021-12-27 LAB — COMPREHENSIVE METABOLIC PANEL
ALT: 31 IU/L (ref 0–32)
AST: 24 IU/L (ref 0–40)
Albumin/Globulin Ratio: 1.6 (ref 1.2–2.2)
Albumin: 4.5 g/dL (ref 4.0–5.0)
Alkaline Phosphatase: 98 IU/L (ref 44–121)
BUN/Creatinine Ratio: 19 (ref 9–23)
BUN: 13 mg/dL (ref 6–20)
Bilirubin Total: 0.5 mg/dL (ref 0.0–1.2)
CO2: 21 mmol/L (ref 20–29)
Calcium: 9.6 mg/dL (ref 8.7–10.2)
Chloride: 100 mmol/L (ref 96–106)
Creatinine, Ser: 0.68 mg/dL (ref 0.57–1.00)
Globulin, Total: 2.9 g/dL (ref 1.5–4.5)
Glucose: 74 mg/dL (ref 70–99)
Potassium: 4.1 mmol/L (ref 3.5–5.2)
Sodium: 139 mmol/L (ref 134–144)
Total Protein: 7.4 g/dL (ref 6.0–8.5)
eGFR: 120 mL/min/{1.73_m2} (ref >59)

## 2021-12-27 LAB — VITAMIN D 25 HYDROXY (VIT D DEFICIENCY, FRACTURES): Vit D, 25-Hydroxy: 25.2 ng/mL — ABNORMAL LOW (ref 30.0–100.0)

## 2021-12-27 LAB — LIPID PANEL WITH LDL/HDL RATIO
Cholesterol, Total: 158 mg/dL (ref 100–199)
HDL: 52 mg/dL (ref >39)
LDL Chol Calc (NIH): 90 mg/dL (ref 0–99)
LDL/HDL Ratio: 1.7 ratio (ref 0.0–3.2)
Triglycerides: 87 mg/dL (ref 0–149)
VLDL Cholesterol Cal: 16 mg/dL (ref 5–40)

## 2021-12-27 LAB — T4, FREE: Free T4: 1.44 ng/dL (ref 0.82–1.77)

## 2021-12-27 LAB — FOLATE: Folate: >20 ng/mL (ref >3.0)

## 2021-12-27 LAB — INSULIN, RANDOM: INSULIN: 23.1 u[IU]/mL (ref 2.6–24.9)

## 2021-12-28 NOTE — Telephone Encounter (Signed)
error 

## 2021-12-31 ENCOUNTER — Other Ambulatory Visit: Payer: Self-pay | Admitting: Allergy & Immunology

## 2021-12-31 ENCOUNTER — Other Ambulatory Visit: Payer: Self-pay

## 2021-12-31 MED ORDER — ASMANEX (120 METERED DOSES) 220 MCG/ACT IN AEPB: INHALATION_SPRAY | RESPIRATORY_TRACT | 4 refills | Status: DC

## 2022-01-02 NOTE — Progress Notes (Signed)
Chief Complaint:   OBESITY Nicole Mendoza (MR# [id number]) is a 31 y.o. female who presents for evaluation and treatment of obesity and related comorbidities. Current BMI is Body mass index is 45.77 kg/m. Nicole Mendoza has been struggling with her weight for many years and has been unsuccessful in either losing weight, maintaining weight loss, or reaching her healthy weight goal.  Nicole Mendoza is currently in the action stage of change and ready to dedicate time achieving and maintaining a healthier weight. Nicole Mendoza is interested in becoming our patient and working on intensive lifestyle modifications including (but not limited to) diet and exercise for weight loss.  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 She is married to husband, Nicole Mendoza, with a 4 year old and 61 month old. Pt has no energy to cook, and with chronic fatigue syndrome, she is unable to be very active.  Nicole Mendoza's habits were reviewed today and are as follows: Her family eats meals together, she thinks her family will eat healthier with her, she struggles with family and or coworkers weight loss sabotage, her desired weight loss is 131-141 lbs, she has been heavy most of her life, she started gaining weight in high school, her heaviest weight ever was 305 pounds, she has significant food cravings issues, she skips meals frequently, she is frequently drinking liquids with calories, she frequently makes poor food choices, she has problems with excessive hunger, she frequently eats larger portions than normal, she has binge eating behaviors, and she struggles with emotional eating.  Depression Screen Nicole Mendoza's Food and Mood (modified PHQ-9) score was 17.     12/26/2021   10:12 AM  Depression screen PHQ 2/9  Decreased Interest 3  Down, Depressed, Hopeless 3  PHQ - 2 Score 6  Altered sleeping 3  Tired, decreased energy 3  Change in appetite 3  Feeling bad or failure about yourself  0  Trouble concentrating 0  Moving slowly or fidgety/restless  2  Suicidal thoughts 0  PHQ-9 Score 17  Difficult doing work/chores Extremely dIfficult   Subjective:   1. Other fatigue Nicole Mendoza admits to daytime somnolence and  DID NOT ANSWER  waking up still tired. Patient has a history of symptoms of daytime fatigue. Nicole Mendoza generally gets 5 or 6 hours of sleep per night, and states that she has generally restful sleep. Snoring is not present. Apneic episodes are not present. Epworth Sleepiness Score is 9. Pt with fibromyalgia and chronic fatigue syndrome. She has OSA with CPAP use nightly. Pt reports very regular use and sees sleep med doctor at neurology.  2. SOB (shortness of breath) on exertion Nicole Mendoza notes increasing shortness of breath with exercising and seems to be worsening over time with weight gain. She notes getting out of breath sooner with activity than she used to. This has gotten worse recently. Nicole Mendoza denies shortness of breath at rest or orthopnea.  3. Essential hypertension Pt is asymptomatic without concerns. Medication: Metoprolol  4. Anemia, unspecified type Medication: ferrous sulfate by mouth 3 days a week and prenatal vitamin.  5. Vitamin D deficiency Vit D level was last checked a year and a half ago at [facility] and was not at goal at 67.  6. NAFLD (nonalcoholic fatty liver disease) Pt has a history of NAFLD. Last labs drawn in January 2023 were within normal limits.  7. Other disorder of eating, emotional eating Pt has been in therapy for 8 months and goes once a month. She  notes it helps her deal with her chronic fatigue and chronic illnesses, such as long haul COVID.  8. At risk of diabetes mellitus Nicole Mendoza is at higher than average risk for developing diabetes due to her obesity.  Assessment/Plan:   1. Other fatigue Nicole Mendoza does feel that her weight is causing her energy to be lower than it should be. Fatigue may be related to obesity, depression or many other causes. Labs will be ordered, and in the  meanwhile, Nicole Mendoza will focus on self care including making healthy food choices, increasing physical activity and focusing on stress reduction. Obtain fasting labs today.  - Insulin, random - Hemoglobin A1c  2. SOB (shortness of breath) on exertion Nicole Mendoza does feel that she gets out of breath more easily that she used to when she exercises. Nicole Mendoza's shortness of breath appears to be obesity related and exercise induced. She has agreed to work on weight loss and gradually increase exercise to treat her exercise induced shortness of breath. Will continue to monitor closely.  3. Essential hypertension BP high normal today but at goal. Nicole Mendoza is working on healthy weight loss and exercise to improve blood pressure control. We will watch for signs of hypotension as she continues her lifestyle modifications. Continue meds per PCP. Obtain fasting labs today.  - TSH - T4, free - Lipid Panel With LDL/HDL Ratio  4. Anemia, unspecified type Orders and follow up as documented in patient record. Continue ferrous sulfate 3 days a week.  Counseling Iron is essential for our bodies to make red blood cells.  Reasons that someone may be deficient include: an iron-deficient diet (more likely in those following vegan or vegetarian diets), women with heavy menses, patients with GI disorders or poor absorption, patients that have had bariatric surgery, frequent blood donors, patients with cancer, and patients with heart disease.   Iron-rich foods include dark leafy greens, red and white meats, eggs, seafood, and beans.   Certain foods and drinks prevent your body from absorbing iron properly. Avoid eating these foods in the same meal as iron-rich foods or with iron supplements. These foods include: coffee, black tea, and red wine; milk, dairy products, and foods that are high in calcium; beans and soybeans; whole grains.  Constipation can be a side effect of iron supplementation. Increased water and fiber intake are  helpful. Water goal: > 2 liters/day. Fiber goal: > 25 grams/day. Obtain fasting labs today.  - Folate - CBC with Differential/Platelet - Vitamin B12  5. Vitamin D deficiency Low Vitamin D level contributes to fatigue and are associated with obesity, breast, and colon cancer. She will follow-up for routine testing of Vitamin D, at least 2-3 times per year to avoid over-replacement. Obtain fasting labs today.  - VITAMIN D 25 Hydroxy (Vit-D Deficiency, Fractures)  6. NAFLD (nonalcoholic fatty liver disease) We discussed the diagnosis of non-alcoholic fatty liver disease today and how this condition is obesity related. Cristin was educated the importance of weight loss. [place] agreed to continue with her weight loss efforts with healthier diet and exercise as an essential part of her treatment plan. Obtain fasting labs today.  - Comprehensive metabolic panel  7. Other disorder of eating, emotional eating Behavior modification techniques were discussed today to help Nicole Mendoza deal with her emotional/non-hunger eating behaviors.  Orders and follow up as documented in patient record. Pt declines psych referral to Dr. Mallie Mussel. Her symptoms are controlled and we will follow closely.  8. Depression screen Nicole Mendoza had a positive depression screening. Depression  is commonly associated with obesity and often results in emotional eating behaviors. We will monitor this closely and work on CBT to help improve the non-hunger eating patterns. Referral to Psychology may be required if no improvement is seen as she continues in our clinic.  9. At risk of diabetes mellitus - Nicole Mendoza was given diabetes prevention education and counseling today of more than 24 minutes.  - Counseled patient on pathophysiology of disease and meaning/ implication of lab results.  - Reviewed how certain foods can either stimulate or inhibit insulin release, and subsequently affect hunger pathways  - Importance of following a healthy meal plan  with limiting amounts of simple carbohydrates discussed with patient - Effects of regular aerobic exercise on blood sugar regulation reviewed and encouraged an eventual goal of 30 min 5d/week or more as a minimum.  - Briefly discussed treatment options, which always include dietary and lifestyle modification as first line.   - Handouts provided at patient's desire and/or told to go online to the American Diabetes Association website for further information.  10. Class 3 severe obesity with serious comorbidity and body mass index (BMI) of 45.0 to 49.9 in adult, unspecified obesity type (HCC) Nicole Mendoza is currently in the action stage of change and her goal is to continue with weight loss efforts. I recommend Nicole Mendoza begin the structured treatment plan as follows:  She has agreed to the Category 3 Plan with 6 oz protein at lunch.  Exercise goals:  As is    Behavioral modification strategies: planning for success.  She was informed of the importance of frequent follow-up visits to maximize her success with intensive lifestyle modifications for her multiple health conditions. She was informed we would discuss her lab results at her next visit unless there is a critical issue that needs to be addressed sooner. Nicole Mendoza agreed to keep her next visit at the agreed upon time to discuss these results.  Objective:   Blood pressure 137/87, pulse 75, temperature 97.6 F (36.4 C), height '5\' 8"'$  (1.727 m), weight (!) 301 lb (136.5 kg), last menstrual period 11/27/2021, SpO2 100 %. Body mass index is 45.77 kg/m.  EKG: Normal sinus rhythm, rate 96 (06/25/2021).  Indirect Calorimeter completed today shows a VO2 of 357 and a REE of 2462.  Her calculated basal metabolic rate is 7341 thus her basal metabolic rate is better than expected.  General: Cooperative, alert, well developed, in no acute distress. HEENT: Conjunctivae and lids unremarkable. Cardiovascular: Regular rhythm.  Lungs: Normal work of  breathing. Neurologic: No focal deficits.   Lab Results  Component Value Date   CREATININE 0.68 12/26/2021   BUN 13 12/26/2021   NA 139 12/26/2021   K 4.1 12/26/2021   CL 100 12/26/2021   CO2 21 12/26/2021   Lab Results  Component Value Date   ALT 31 12/26/2021   AST 24 12/26/2021   ALKPHOS 98 12/26/2021   BILITOT 0.5 12/26/2021   Lab Results  Component Value Date   HGBA1C 5.5 12/26/2021   HGBA1C 5.6 06/22/2021   Lab Results  Component Value Date   INSULIN 23.1 12/26/2021   Lab Results  Component Value Date   TSH 2.020 12/26/2021   Lab Results  Component Value Date   CHOL 158 12/26/2021   HDL 52 12/26/2021   LDLCALC 90 12/26/2021   TRIG 87 12/26/2021   Lab Results  Component Value Date   WBC 8.3 12/26/2021   HGB 13.6 12/26/2021   HCT 42.0 12/26/2021   MCV  86 12/26/2021   PLT 291 12/26/2021     Attestation Statements:   Reviewed by clinician on day of visit: allergies, medications, problem list, medical history, surgical history, family history, social history, and previous encounter notes.  I, Kathlene November, BS, CMA, am acting as transcriptionist for [employer], DO.  The [place] was signed into law in 2016 which includes the topic of electronic health records.  This provides immediate access to information in MyChart.  This includes consultation notes, operative notes, office notes, lab results and pathology reports.  If you have any questions about what you read please let us know at your next visit so we can discuss your concerns and take corrective action if need be.  We are right here with you.

## 2022-01-09 ENCOUNTER — Ambulatory Visit (INDEPENDENT_AMBULATORY_CARE_PROVIDER_SITE_OTHER): Payer: 59 | Admitting: Family Medicine

## 2022-01-09 ENCOUNTER — Encounter (INDEPENDENT_AMBULATORY_CARE_PROVIDER_SITE_OTHER): Payer: Self-pay | Admitting: Family Medicine

## 2022-01-09 VITALS — BP 138/87 | HR 84 | Temp 97.7°F | Ht 68.0 in | Wt 299.0 lb

## 2022-01-09 DIAGNOSIS — I10 Essential (primary) hypertension: Secondary | ICD-10-CM | POA: Diagnosis not present

## 2022-01-09 DIAGNOSIS — R7303 Prediabetes: Secondary | ICD-10-CM

## 2022-01-09 DIAGNOSIS — K760 Fatty (change of) liver, not elsewhere classified: Secondary | ICD-10-CM

## 2022-01-09 DIAGNOSIS — D508 Other iron deficiency anemias: Secondary | ICD-10-CM

## 2022-01-09 DIAGNOSIS — Z6841 Body Mass Index (BMI) 40.0 and over, adult: Secondary | ICD-10-CM

## 2022-01-09 DIAGNOSIS — E559 Vitamin D deficiency, unspecified: Secondary | ICD-10-CM | POA: Diagnosis not present

## 2022-01-09 DIAGNOSIS — E669 Obesity, unspecified: Secondary | ICD-10-CM

## 2022-01-09 DIAGNOSIS — Z9189 Other specified personal risk factors, not elsewhere classified: Secondary | ICD-10-CM

## 2022-01-13 NOTE — Progress Notes (Signed)
Chief Complaint:   OBESITY Nicole Mendoza is here to discuss her progress with her obesity treatment plan along with follow-up of her obesity related diagnoses. Nicole Mendoza is on the Category 3 Plan with 6 oz protein at lunch and states she is following her eating plan approximately 75% of the time. Nicole Mendoza states she is using stationary bike 5-7 minutes 3-4 times per week.  Today's visit was #: 2 Starting weight: 301 lbs Starting date: 12/26/2021 Today's weight: 299 lbs Today's date: 01/09/2022 Total lbs lost to date: 2 Total lbs lost since last in-office visit: 2  Interim History: Nicole Mendoza is here today for her first follow-up office visit since starting the program with [place].  All blood work/ lab tests that were recently ordered by myself or an outside provider were reviewed with patient today per their request.   Extended time was spent counseling her on all new disease processes that were discovered or preexisting ones that are affected by BMI.  she understands that many of these abnormalities will need to monitored regularly along with the current treatment plan of prudent dietary changes, in which we are making each and every office visit, to improve these health parameters. Nicole Mendoza started plan 1 week ago and is following it 75% and can't do lunch because it is too thick of a sandwich. She is eating all of her foods most of the time and denies hunger or cravings.  Subjective:   1. NAFLD (nonalcoholic fatty liver disease) Discussed labs with patient today. Pt's visceral fat rating is elevated at 14.  2. Vitamin D deficiency Worsening. Discussed labs with patient today. She is only on OTC Vit D 2000 IU daily and reports chronic fatigue.  3. Prediabetes New. Discussed labs with patient today. Pt with elevated fasting insulin at 23.1. Her A1c was up in the past, but is now 5.5.  4. Essential hypertension Discussed labs with patient today. Pt denies concerns. Medication: metoprolol  5. Other  iron deficiency anemia Discussed labs with patient today. CBC stable and pt is asymptomatic. Medication: ferrous sulfate 3 days a week and prenatal vitamin  6. At risk for diabetes mellitus Nicole Mendoza is at risk for diabetes mellitus due to elevated fasting insulin and A1c with current BMI.  Assessment/Plan:  No orders of the defined types were placed in this encounter.   There are no discontinued medications.   No orders of the defined types were placed in this encounter.    1. NAFLD (nonalcoholic fatty liver disease) We discussed the diagnosis of non-alcoholic fatty liver disease today and how this condition is obesity related. Nicole Mendoza was educated the importance of weight loss. Nicole Mendoza agreed to continue with her weight loss efforts with healthier diet and exercise as an essential part of her treatment plan. CMP within normal limits. ALT and AST within normal limits.  2. Vitamin D deficiency Not at goal at 25.2. Low Vitamin D level contributes to fatigue and are associated with obesity, breast, and colon cancer. She agrees to start Vitamin D 4,000 IU daily and will follow-up for routine testing of Vitamin D, at least 2-3 times per year to avoid over-replacement. Pt did not tolerate weekly Ergocalciferol due to GI upset.  3. Prediabetes Nicole Mendoza will continue to work on weight loss, exercise, and decreasing simple carbohydrates to help decrease the risk of diabetes. Handouts on prediabetes, insulin resistance, and Metformin provided to pt. We will consider Metformin in the future, if needed.  4. Essential hypertension BP essentially within normal  limits. CMP within normal limits and serum creatinine stable. Nicole Mendoza is working on healthy weight loss and exercise to improve blood pressure control. We will watch for signs of hypotension as she continues her lifestyle modifications. Continue meds per PCP.  5. Other iron deficiency anemia Hemoglobin 13.6 and hematocrit 42.0 respectively. Continue ferrous  sulfate 325 mg 3 days a week and prenatal vitamin. Continue prudent nutritional plan with iron rich foods.  6. At risk for diabetes mellitus - Nicole Mendoza was given diabetes prevention education and counseling today of more than >25 minutes.  - Counseled patient on pathophysiology of disease and meaning/ implication of lab results.  - Reviewed how certain foods can either stimulate or inhibit insulin release, and subsequently affect hunger pathways  - Importance of following a healthy meal plan with limiting amounts of simple carbohydrates discussed with patient - Effects of regular aerobic exercise on blood sugar regulation reviewed and encouraged an eventual goal of 30 min 5d/week or more as a minimum.  - Briefly discussed treatment options, which always include dietary and lifestyle modification as first line.   - Handouts provided at patient's desire and/or told to go online to the American Diabetes Association website for further information.  7. Obesity, current BMI 45.5 Nicole Mendoza is currently in the action stage of change. As such, her goal is to continue with weight loss efforts. She has agreed to the Category 3 Plan with lunch options and 6 oz protein at lunch.   Long discussion had with pt about bread alternatives.  Handout: Protein Equivalents.  Exercise goals:  As is  Behavioral modification strategies: increasing lean protein intake, no skipping meals, and planning for success.  Nicole Mendoza has agreed to follow-up with our clinic in 2 weeks. She was informed of the importance of frequent follow-up visits to maximize her success with intensive lifestyle modifications for her multiple health conditions.   Objective:   Blood pressure 138/87, pulse 84, temperature 97.7 F (36.5 C), height '5\' 8"'$  (1.727 m), weight 299 lb (135.6 kg), last menstrual period 11/27/2021, SpO2 100 %. Body mass index is 45.46 kg/m.  General: Cooperative, alert, well developed, in no acute distress. HEENT: Conjunctivae  and lids unremarkable. Cardiovascular: Regular rhythm.  Lungs: Normal work of breathing. Neurologic: No focal deficits.   Lab Results  Component Value Date   CREATININE 0.68 12/26/2021   BUN 13 12/26/2021   NA 139 12/26/2021   K 4.1 12/26/2021   CL 100 12/26/2021   CO2 21 12/26/2021   Lab Results  Component Value Date   ALT 31 12/26/2021   AST 24 12/26/2021   ALKPHOS 98 12/26/2021   BILITOT 0.5 12/26/2021   Lab Results  Component Value Date   HGBA1C 5.5 12/26/2021   HGBA1C 5.6 06/22/2021   Lab Results  Component Value Date   INSULIN 23.1 12/26/2021   Lab Results  Component Value Date   TSH 2.020 12/26/2021   Lab Results  Component Value Date   CHOL 158 12/26/2021   HDL 52 12/26/2021   LDLCALC 90 12/26/2021   TRIG 87 12/26/2021   Lab Results  Component Value Date   VD25OH 25.2 (L) 12/26/2021   VD25OH 41.0 09/01/2019   Lab Results  Component Value Date   WBC 8.3 12/26/2021   HGB 13.6 12/26/2021   HCT 42.0 12/26/2021   MCV 86 12/26/2021   PLT 291 12/26/2021    Attestation Statements:   Reviewed by clinician on day of visit: allergies, medications, problem list, medical history, surgical history,  family history, social history, and previous encounter notes.  I, Kathlene November, BS, CMA, am acting as transcriptionist for [employer], DO.   I have reviewed the above documentation for accuracy and completeness, and I agree with the above. Marjory Sneddon, D.O.  The [place] was signed into law in 2016 which includes the topic of electronic health records.  This provides immediate access to information in MyChart.  This includes consultation notes, operative notes, office notes, lab results and pathology reports.  If you have any questions about what you read please let us know at your next visit so we can discuss your concerns and take corrective action if need be.  We are right here with you.

## 2022-01-22 ENCOUNTER — Ambulatory Visit (INDEPENDENT_AMBULATORY_CARE_PROVIDER_SITE_OTHER): Payer: 59 | Admitting: Bariatrics

## 2022-01-23 ENCOUNTER — Encounter (INDEPENDENT_AMBULATORY_CARE_PROVIDER_SITE_OTHER): Payer: Self-pay

## 2022-01-23 ENCOUNTER — Ambulatory Visit (INDEPENDENT_AMBULATORY_CARE_PROVIDER_SITE_OTHER): Payer: 59 | Admitting: Family Medicine

## 2022-02-01 ENCOUNTER — Ambulatory Visit (INDEPENDENT_AMBULATORY_CARE_PROVIDER_SITE_OTHER): Payer: 59 | Admitting: Internal Medicine

## 2022-02-01 ENCOUNTER — Encounter: Payer: Self-pay | Admitting: Internal Medicine

## 2022-02-01 ENCOUNTER — Ambulatory Visit: Payer: 59 | Admitting: Allergy & Immunology

## 2022-02-01 VITALS — BP 136/84 | HR 95 | Temp 98.4°F | Resp 18 | Wt 301.5 lb

## 2022-02-01 DIAGNOSIS — J4541 Moderate persistent asthma with (acute) exacerbation: Secondary | ICD-10-CM

## 2022-02-01 DIAGNOSIS — B999 Unspecified infectious disease: Secondary | ICD-10-CM | POA: Diagnosis not present

## 2022-02-01 DIAGNOSIS — J0181 Other acute recurrent sinusitis: Secondary | ICD-10-CM | POA: Diagnosis not present

## 2022-02-01 DIAGNOSIS — J302 Other seasonal allergic rhinitis: Secondary | ICD-10-CM

## 2022-02-01 DIAGNOSIS — J4540 Moderate persistent asthma, uncomplicated: Secondary | ICD-10-CM

## 2022-02-01 DIAGNOSIS — D806 Antibody deficiency with near-normal immunoglobulins or with hyperimmunoglobulinemia: Secondary | ICD-10-CM | POA: Diagnosis not present

## 2022-02-01 DIAGNOSIS — K219 Gastro-esophageal reflux disease without esophagitis: Secondary | ICD-10-CM

## 2022-02-01 MED ORDER — METHYLPREDNISOLONE ACETATE 40 MG/ML IJ SUSP
40.0000 mg | Freq: Once | INTRAMUSCULAR | Status: AC
  Administered 2022-02-04: 40 mg via INTRAMUSCULAR

## 2022-02-01 NOTE — Progress Notes (Signed)
FOLLOW UP Date of Service/Encounter:  02/01/22   Subjective:  Nicole Mendoza (DOB: 03/24/1991) is a 31 y.o. female who returns to the [facility] [place] on 02/01/2022 in re-evaluation of the following: acute visit for sinus infection History obtained from: chart review and patient.  For Review, LV was on 11/29/21  with Dr. Ernst Bowler seen for routine follow-up of mild persistent asthma, vocal cord dysfunctoin, recurrent infections, seasonal and perennial allergic rhinitis, GERD, adverse food reaction. She was doing well, but encouraged to start Hizentra.  Today presents for follow-up. She is not taking her azithromycin prophylaxis, still has reservations about starting Hizentra.  She stopped azithromycin about one month ago because it gives her thrush.  When she has to take fluconazole with the azithromycin, it then upsets her stomach.  She has IBS.  In summary, it is causing lots of adverse side effects. She does feel that since starting the azithromycin her infections have reduced in number and severity. However, is here today because she has severe nonstop drainage, no rhinorrhea  since yesterday.   Her eyes are watery and she feels sinus pressure.  She feels like this is the start of a sinus infection, and she is worried about letting symptoms go too long as this has historically caused prolonged symptoms. She is a little more winded more than normal, but doesn't feel like she is in a flare.  Has not started her pulmicort. She did start her azithromcyin yesterday. Has taken 2 doses of 500 mg. She was on vacation last week and her place of stay was extremely musty. She feels this was inciting factor for her current flare.   Allergies as of 02/01/2022       Reactions   Budesonide-formoterol Fumarate Other (See Comments)   Causes Hypertension   Other    Tree nuts   Penicillins Hives   Raspberry Hives   Spiriva Respimat [tiotropium Bromide Monohydrate] Other (See Comments)   Pt  states medication causes dizziness   Albuterol Palpitations   Losartan Rash        Medication List        Accurate as of February 01, 2022  2:47 PM. If you have any questions, ask your nurse or doctor.          Asmanex (120 Metered Doses) 220 MCG/ACT inhaler Generic drug: mometasone INHALE 2 PUFFS INTO THE LUNGS TWICE A DAY   azithromycin 500 MG tablet Commonly known as: ZITHROMAX Take by mouth.   budesonide 0.5 MG/2ML nebulizer solution Commonly known as: PULMICORT Take 0.5 mg by nebulization as needed.   cetirizine 10 MG tablet Commonly known as: ZYRTEC Take 10 mg by mouth daily.   dexlansoprazole 60 MG capsule Commonly known as: Dexilant Take 1 capsule (60 mg total) by mouth daily.   EPINEPHrine 0.3 mg/0.3 mL Soaj injection Commonly known as: Auvi-Q Use as directed for severe allergic reactions   famotidine 20 MG tablet Commonly known as: PEPCID Take 1 tablet (20 mg total) by mouth 2 (two) times daily.   ferrous sulfate 325 (65 FE) MG tablet 3 (three) times a week.   levalbuterol 1.25 MG/3ML nebulizer solution Commonly known as: XOPENEX USE 1 VIAL BY NEBULIZATION EVERY 6 (SIX) HOURS AS NEEDED FOR WHEEZING.   metoprolol succinate 100 MG 24 hr tablet Commonly known as: TOPROL-XL Take 1 tablet (100 mg total) by mouth daily. Take with or immediately following a meal.   montelukast 10 MG tablet Commonly known as: SINGULAIR Take 1 tablet (10  mg total) by mouth daily.   multivitamin-prenatal 27-0.8 MG Tabs tablet Take 1 tablet by mouth daily at 12 noon.   norethindrone 0.35 MG tablet Commonly known as: MICRONOR Take 0.35 mg by mouth.   ondansetron 4 MG tablet Commonly known as: Zofran Take 1 tablet (4 mg total) by mouth every 8 (eight) hours as needed for nausea or vomiting.   [facility] Colon Health Caps   SUPER C COMPLEX PO   Xhance 93 MCG/ACT Exhu Generic drug: Fluticasone Propionate Place 93 mcg into the nose 2 (two) times daily. 2 sprays twice  daily       Past Medical History:  Diagnosis Date   Allergic rhinitis    Allergy    Angio-edema    Asthma    Dysmenorrhea    Fatty liver    GERD (gastroesophageal reflux disease)    Hypertension    Recurrent upper respiratory infection (URI)    Sleep apnea    Tachycardia    Past Surgical History:  Procedure Laterality Date   NO PAST SURGERIES     Otherwise, there have been no changes to her past medical history, surgical history, family history, or social history.  ROS: All others negative except as noted per HPI.   Objective:  BP 136/84 (BP Location: Left Arm, Patient Position: Sitting, Cuff Size: Large)   Pulse 95   Temp 98.4 F (36.9 C) (Temporal)   Resp 18   Wt (!) 301 lb 8 oz (136.8 kg)   SpO2 98%   BMI 45.84 kg/m  Body mass index is 45.84 kg/m. Physical Exam: General Appearance:  Alert, cooperative, no distress, appears stated age  Head:  Normocephalic, without obvious abnormality, atraumatic  Eyes:  Conjunctiva clear, EOM's intact  Nose: Nares normal, hypertrophic turbinates, normal mucosa, and no visible anterior polyps  Throat: Lips, tongue normal; teeth and gums normal, normal posterior oropharynx and + cobblestoning  Neck: Supple, symmetrical  Lungs:   clear to auscultation bilaterally, Respirations unlabored, no coughing  Heart:  regular rate and rhythm and no murmur, Appears well perfused  Extremities: No edema  Skin: Skin color, texture, turgor normal, no rashes or lesions on visualized portions of skin  Neurologic: No gross deficits  Spirometry:  Tracings reviewed. Her effort: Good reproducible efforts. FVC: 3.50L FEV1: 2.81L, 77% predicted FEV1/FVC ratio: 95% Interpretation: Spirometry consistent with normal pattern.  Please see scanned spirometry results for details.  Assessment/Plan   Acute sinusitis:  - start azithromycin 500 mg continue for full 5 days, then stop  - next week start back on prophylaxis dosing -start your pulmicort  twice daily for the next 1-2 weeks - 40 mg IM depomedrol today in clinic - mucinex [phone] mg twice daily as needed for mucus production; take with lots of water  Asthma-with exacerbation - Lung testing looked great today.  - Daily controller medication(s): Singulair '10mg'$  daily and Asmanex 1-2 puffs twice daily  - Prior to physical activity: Xopenex 2 puffs 10-15 minutes before physical activity. - Rescue medications: Xopenex 4 puffs every 4-6 hours as needed - Changes during respiratory infections or worsening symptoms: Add on Pulmicort 0.'5mg'$  to one treatment twice daily for TWO WEEKS. - Asthma control goals:  * Full participation in all desired activities (may need albuterol before activity) * Albuterol use two time or less a week on average (not counting use with activity) * Cough interfering with sleep two time or less a month * Oral steroids no more than once a year * No hospitalizations  Allergic rhinitis- - Continue saline rinses several times a day - Continue cetirizine 10 mg once a day as needed for a runny nose or itch. - Continue Xhance 2 sprays in each nostril twice a day for nasal symptoms  Recurrent infections - START YOUR HIZENTRA. - continue your azithromycin   Reflux - Continue dietary and lifestyle modifications as listed below - PLEASE CONTINUE Dexilant 60 mg once a day to prevent reflux.  - Continue famotidine 20 mg twice a day to prevent reflux  Vocal cord dysfunction - Continue with speech therapy at home lessons as you are doing.   Food allergy - Continue to avoid tree nuts.  In case of an allergic reaction, take Benadryl 50 mg every 4 hours, and if life-threatening symptoms occur, inject with AuviQ 0.3 mg. - We will get that mixed tree nut challenge done at some point.   Follow-up in 6 months, sooner if needed.   Please inform us of any Emergency Department visits, hospitalizations, or changes in symptoms. Call us before going to the ED for breathing  or allergy symptoms since we might be able to fit you in for a sick visit. Feel free to contact us anytime with any questions, problems, or concerns.  It was a pleasure to see you today!  Sigurd Sos, MD  [facility]

## 2022-02-01 NOTE — Patient Instructions (Addendum)
Acute sinusitis:  - start azithromycin 500 mg continue for full 5 days, then stop  - next week start back on prophylaxis dosing -start your pulmicort twice daily for the next 1-2 weeks - 40 mg IM depomedrol today in clinic - mucinex 600-1200 mg twice daily as needed for mucus production; take with lots of water- c  Asthma- - Lung testing looked great today.  - Daily controller medication(s): Singulair 10mg daily and Asmanex 1-2 puffs twice daily  - Prior to physical activity: Xopenex 2 puffs 10-15 minutes before physical activity. - Rescue medications: Xopenex 4 puffs every 4-6 hours as needed - Changes during respiratory infections or worsening symptoms: Add on Pulmicort 0.5mg to one treatment twice daily for TWO WEEKS. - Asthma control goals:  * Full participation in all desired activities (may need albuterol before activity) * Albuterol use two time or less a week on average (not counting use with activity) * Cough interfering with sleep two time or less a month * Oral steroids no more than once a year * No hospitalizations  Allergic rhinitis - Continue saline rinses several times a day - Continue cetirizine 10 mg once a day as needed for a runny nose or itch. - Continue Xhance 2 sprays in each nostril twice a day for nasal symptoms  Recurrent infections - START YOUR HIZENTRA. - continue your azithromycin   Reflux - Continue dietary and lifestyle modifications as listed below - PLEASE CONTINUE Dexilant 60 mg once a day to prevent reflux.  - Continue famotidine 20 mg twice a day to prevent reflux  Vocal cord dysfunction - Continue with speech therapy at home lessons as you are doing.   Food allergy - Continue to avoid tree nuts.  In case of an allergic reaction, take Benadryl 50 mg every 4 hours, and if life-threatening symptoms occur, inject with AuviQ 0.3 mg. - We will get that mixed tree nut challenge done at some point.   Follow-up in 6 months, sooner if  needed.   Please inform us of any Emergency Department visits, hospitalizations, or changes in symptoms. Call us before going to the ED for breathing or allergy symptoms since we might be able to fit you in for a sick visit. Feel free to contact us anytime with any questions, problems, or concerns.  It was a pleasure to see you today!  Websites that have reliable patient information: 1. American Academy of Asthma, Allergy, and Immunology: www.aaaai.org 2. Food Allergy Research and Education (FARE): foodallergy.org 3. Mothers of Asthmatics: http://www.asthmacommunitynetwork.org 4. American College of Allergy, Asthma, and Immunology: www.acaai.org     

## 2022-02-04 ENCOUNTER — Other Ambulatory Visit: Payer: Self-pay | Admitting: Internal Medicine

## 2022-02-04 ENCOUNTER — Encounter: Payer: Self-pay | Admitting: Internal Medicine

## 2022-02-04 ENCOUNTER — Ambulatory Visit (INDEPENDENT_AMBULATORY_CARE_PROVIDER_SITE_OTHER): Payer: 59 | Admitting: Internal Medicine

## 2022-02-04 DIAGNOSIS — J0181 Other acute recurrent sinusitis: Secondary | ICD-10-CM | POA: Diagnosis not present

## 2022-02-04 DIAGNOSIS — D806 Antibody deficiency with near-normal immunoglobulins or with hyperimmunoglobulinemia: Secondary | ICD-10-CM

## 2022-02-04 DIAGNOSIS — J4541 Moderate persistent asthma with (acute) exacerbation: Secondary | ICD-10-CM

## 2022-02-04 MED ORDER — METHYLPREDNISOLONE ACETATE 40 MG/ML IJ SUSP
40.0000 mg | Freq: Once | INTRAMUSCULAR | Status: AC
  Administered 2022-02-04: 40 mg via INTRAMUSCULAR

## 2022-02-04 MED ORDER — DOXYCYCLINE MONOHYDRATE 100 MG PO TABS
100.0000 mg | ORAL_TABLET | Freq: Two times a day (BID) | ORAL | 0 refills | Status: AC
Start: 2022-02-04 — End: 2022-02-11

## 2022-02-04 NOTE — Patient Instructions (Signed)
Acute sinus infection:   - stop azithromycin - start doxycycline 100 mg BID x 7 days, take with probiotics (patient already taking) -Repeat 40 mg IM methylprednisolone dose to be provided in clinic this afternoon -Start budesonide 0.5 mg 1 vial twice daily for the next 2 weeks, and can use Xopenex prior to budesonide as well as every 6 hours scheduled while awake for the next 2 to 3 days -Continue Mucinex 600 to 1200 mg twice daily as needed for mucus production, drink with plenty of water -Once completed doxycycline course, can restart azithromycin prophylaxis  Continue rest of allergy and asthma medications as prescribed.  Follow-up for regular scheduled appointment in 6 months

## 2022-02-04 NOTE — Patient Instructions (Signed)

## 2022-02-04 NOTE — Progress Notes (Unsigned)
RE: Nicole Mendoza MRN: [id number] DOB: Sep 03, 1990 Date of Telemedicine Visit: 02/04/2022  Referring provider: No ref. provider found Primary care provider: [name], MD  Chief Complaint: No chief complaint on file.   Telemedicine Follow Up Visit via Telephone: I connected with Nicole Mendoza for a follow up on 02/04/22 by telephone and verified that I am speaking with the correct person using two identifiers.   I discussed the limitations, risks, security and privacy concerns of performing an evaluation and management service by telephone and the availability of in person appointments. I also discussed with the patient that there may be a patient responsible charge related to this service. The patient expressed understanding and agreed to proceed.  Patient is at home/work accompanied by herself (patient) who provided/contributed to the history.  Provider is at the office.  Visit start time: 12:01 Visit end time: 12:06 Insurance consent/check in by: front desk Medical consent and medical assistant/nurse: [name], CNA  History of Present Illness: She is a 31 y.o. female, who is being followed for selective antibody deficiency, mild persistent asthma, vocal cord dysfunctoin, recurrent infections, seasonal and perennial allergic rhinitis, GERD, adverse food reaction. Her previous allergy office visit was on 02/01/22 with  Dr. [name]  seen for acute sinusitis.    She was restarted on her azithromycin prophylaxis, but instructed to take 5 days in a row to treat potential bacterial infection given her underlying specific antibody deficiency.  We also discussed starting her Pulmicort twice daily to be used for the next 2 weeks to prevent flare of asthma.  She was given 40 mg of IM methylprednisolone.  Today she returns a phone call stating that she is feeling worse since this Friday.  She did feel some relief Friday afternoon following steroid injection, but steadily over the weekend  has noted decline.  She is extremely congested and feels tightness in her chest.  She is not wheezing or coughing.  She is using her Pulmicort once per day.  She did restart her azithromycin.  She is finding some relief with decongestant.  However, overall she feels miserable and is extremely fatigued and congested.  Assessment and Plan: [name] is a 31 y.o. female with: Acute sinus infection:   - stop azithromycin - start doxycycline 100 mg BID x 7 days, take with probiotics (patient already taking) -Repeat 40 mg IM methylprednisolone dose to be provided in clinic this afternoon -Start budesonide 0.5 mg 1 vial twice daily for the next 2 weeks, and can use Xopenex prior to budesonide as well as every 6 hours scheduled while awake for the next 2 to 3 days -Continue Mucinex 600 to 1200 mg twice daily as needed for mucus production, drink with plenty of water -Once completed doxycycline course, can restart azithromycin prophylaxis  Continue rest of allergy and asthma medications as prescribed.  Follow-up for regular scheduled appointment in 6 months  Meds ordered this encounter  Medications   doxycycline (ADOXA) 100 MG tablet    Sig: Take 1 tablet (100 mg total) by mouth 2 (two) times daily for 7 days.    Dispense:  14 tablet    Refill:  0   Lab Orders  No laboratory test(s) ordered today    Diagnostics: None.  Medication List:  Current Outpatient Medications  Medication Sig Dispense Refill   doxycycline (ADOXA) 100 MG tablet Take 1 tablet (100 mg total) by mouth 2 (two) times daily for 7 days. 14 tablet 0   Ascorbic Acid (SUPER C  COMPLEX PO)      azithromycin (ZITHROMAX) 500 MG tablet Take by mouth.     budesonide (PULMICORT) 0.5 MG/2ML nebulizer solution Take 0.5 mg by nebulization as needed.     cetirizine (ZYRTEC) 10 MG tablet Take 10 mg by mouth daily.     dexlansoprazole (DEXILANT) 60 MG capsule Take 1 capsule (60 mg total) by mouth daily. 30 capsule 5   EPINEPHrine (AUVI-Q)  0.3 mg/0.3 mL IJ SOAJ injection Use as directed for severe allergic reactions 4 each 1   famotidine (PEPCID) 20 MG tablet Take 1 tablet (20 mg total) by mouth 2 (two) times daily. 180 tablet 1   ferrous sulfate 325 (65 FE) MG tablet 3 (three) times a week.      Fluticasone Propionate (XHANCE) 93 MCG/ACT EXHU Place 93 mcg into the nose 2 (two) times daily. 2 sprays twice daily     levalbuterol (XOPENEX) 1.25 MG/3ML nebulizer solution USE 1 VIAL BY NEBULIZATION EVERY 6 (SIX) HOURS AS NEEDED FOR WHEEZING. 90 mL 1   metoprolol succinate (TOPROL-XL) 100 MG 24 hr tablet Take 1 tablet (100 mg total) by mouth daily. Take with or immediately following a meal. 90 tablet 3   mometasone (ASMANEX, 120 METERED DOSES,) 220 MCG/ACT inhaler INHALE 2 PUFFS INTO THE LUNGS TWICE A DAY 1 each 4   montelukast (SINGULAIR) 10 MG tablet Take 1 tablet (10 mg total) by mouth daily. 90 tablet 1   norethindrone (MICRONOR) 0.35 MG tablet Take 0.35 mg by mouth.     ondansetron (ZOFRAN) 4 MG tablet Take 1 tablet (4 mg total) by mouth every 8 (eight) hours as needed for nausea or vomiting. 20 tablet 0   Prenatal Vit-Fe Fumarate-FA (MULTIVITAMIN-PRENATAL) 27-0.8 MG TABS tablet Take 1 tablet by mouth daily at 12 noon.     Probiotic Product ([place]) CAPS      Current Facility-Administered Medications  Medication Dose Route Frequency Provider Last Rate Last Admin   methylPREDNISolone acetate (DEPO-MEDROL) injection 40 mg  40 mg Intramuscular Once Clemon Chambers, MD       Allergies: Allergies  Allergen Reactions   Budesonide-Formoterol Fumarate Other (See Comments)    Causes Hypertension   Other     Tree nuts   Penicillins Hives   Raspberry Hives   Spiriva Respimat [Tiotropium Bromide Monohydrate] Other (See Comments)    Pt states medication causes dizziness   Albuterol Palpitations   Losartan Rash   I reviewed her past medical history, social history, family history, and environmental history and no  significant changes have been reported from her previous visit.  Review of Systems-negative except as per HPI  Objective: Physical Exam Not obtained as encounter was done via telephone.  Patient talking in full sentences, sounds extremely congested.  Previous notes and tests were reviewed.  I discussed the assessment and treatment plan with the patient. The patient was provided an opportunity to ask questions and all were answered. The patient agreed with the plan and demonstrated an understanding of the instructions. After visit summary/patient instructions available via mychart.   The patient was advised to call back or seek an in-person evaluation if the symptoms worsen or if the condition fails to improve as anticipated.  I provided 5 minutes of non-face-to-face time during this encounter.  It was my pleasure to participate in [place] care today. Please feel free to contact me with any questions or concerns.   Sincerely,  Sigurd Sos, MD [facility]  [facility] office: [place] office: [phone]

## 2022-02-04 NOTE — Progress Notes (Signed)
RE: Nicole Mendoza MRN: [id number] DOB: Apr 02, 1991 Date of Telemedicine Visit: 02/04/2022  Referring provider: No ref. provider found Primary care provider: [name], MD  Chief Complaint: No chief complaint on file.   Telemedicine Follow Up Visit via Telephone: I connected with Nicole Mendoza for a follow up on 02/04/22 by telephone and verified that I am speaking with the correct person using two identifiers.   I discussed the limitations, risks, security and privacy concerns of performing an evaluation and management service by telephone and the availability of in person appointments. I also discussed with the patient that there may be a patient responsible charge related to this service. The patient expressed understanding and agreed to proceed.  Patient is at home/work accompanied by herself (patient) who provided/contributed to the history.  Provider is at the office.  Visit start time: 12:01 Visit end time: 12:06 Insurance consent/check in by: front desk Medical consent and medical assistant/nurse: Nicole Andreas, CNA  History of Present Illness: She is a 31 y.o. female, who is being followed for selective antibody deficiency, mild persistent asthma, vocal cord dysfunctoin, recurrent infections, seasonal and perennial allergic rhinitis, GERD, adverse food reaction. Her previous allergy office visit was on 02/01/22 with Dr. Simona Mendoza seen for acute sinusitis.    She was restarted on her azithromycin prophylaxis, but instructed to take 5 days in a row to treat potential bacterial infection given her underlying specific antibody deficiency.  We also discussed starting her Pulmicort twice daily to be used for the next 2 weeks to prevent flare of asthma.  She was given 40 mg of IM methylprednisolone.  Today she returns a phone call stating that she is feeling worse since this Friday.  She did feel some relief Friday afternoon following steroid injection, but steadily over the weekend has  noted decline.  She is extremely congested and feels tightness in her chest.  She is not wheezing or coughing.  She is using her Pulmicort once per day.  She did restart her azithromycin.  She is finding some relief with decongestant.  However, overall she feels miserable and is extremely fatigued and congested.  Assessment and Plan: Nicole Mendoza is a 31 y.o. female with: Acute sinus infection:   - stop azithromycin - start doxycycline 100 mg BID x 7 days, take with probiotics (patient already taking) -Repeat 40 mg IM methylprednisolone dose to be provided in clinic this afternoon -Start budesonide 0.5 mg 1 vial twice daily for the next 2 weeks, and can use Xopenex prior to budesonide as well as every 6 hours scheduled while awake for the next 2 to 3 days -Continue Mucinex 600 to 1200 mg twice daily as needed for mucus production, drink with plenty of water -Once completed doxycycline course, can restart azithromycin prophylaxis  Continue rest of allergy and asthma medications as prescribed.  Follow-up for regular scheduled appointment in 6 months  Meds ordered this encounter  Medications   doxycycline (ADOXA) 100 MG tablet    Sig: Take 1 tablet (100 mg total) by mouth 2 (two) times daily for 7 days.    Dispense:  14 tablet    Refill:  0   Lab Orders  No laboratory test(s) ordered today    Diagnostics: None.  Medication List:  Current Outpatient Medications  Medication Sig Dispense Refill   doxycycline (ADOXA) 100 MG tablet Take 1 tablet (100 mg total) by mouth 2 (two) times daily for 7 days. 14 tablet 0   Ascorbic Acid (SUPER C COMPLEX PO)  azithromycin (ZITHROMAX) 500 MG tablet Take by mouth.     budesonide (PULMICORT) 0.5 MG/2ML nebulizer solution Take 0.5 mg by nebulization as needed.     cetirizine (ZYRTEC) 10 MG tablet Take 10 mg by mouth daily.     dexlansoprazole (DEXILANT) 60 MG capsule Take 1 capsule (60 mg total) by mouth daily. 30 capsule 5   EPINEPHrine (AUVI-Q) 0.3  mg/0.3 mL IJ SOAJ injection Use as directed for severe allergic reactions 4 each 1   famotidine (PEPCID) 20 MG tablet Take 1 tablet (20 mg total) by mouth 2 (two) times daily. 180 tablet 1   ferrous sulfate 325 (65 FE) MG tablet 3 (three) times a week.      Fluticasone Propionate (XHANCE) 93 MCG/ACT EXHU Place 93 mcg into the nose 2 (two) times daily. 2 sprays twice daily     levalbuterol (XOPENEX) 1.25 MG/3ML nebulizer solution USE 1 VIAL BY NEBULIZATION EVERY 6 (SIX) HOURS AS NEEDED FOR WHEEZING. 90 mL 1   metoprolol succinate (TOPROL-XL) 100 MG 24 hr tablet Take 1 tablet (100 mg total) by mouth daily. Take with or immediately following a meal. 90 tablet 3   mometasone (ASMANEX, 120 METERED DOSES,) 220 MCG/ACT inhaler INHALE 2 PUFFS INTO THE LUNGS TWICE A DAY 1 each 4   montelukast (SINGULAIR) 10 MG tablet Take 1 tablet (10 mg total) by mouth daily. 90 tablet 1   norethindrone (MICRONOR) 0.35 MG tablet Take 0.35 mg by mouth.     ondansetron (ZOFRAN) 4 MG tablet Take 1 tablet (4 mg total) by mouth every 8 (eight) hours as needed for nausea or vomiting. 20 tablet 0   Prenatal Vit-Fe Fumarate-FA (MULTIVITAMIN-PRENATAL) 27-0.8 MG TABS tablet Take 1 tablet by mouth daily at 12 noon.     Probiotic Product ([place]) CAPS      Current Facility-Administered Medications  Medication Dose Route Frequency Provider Last Rate Last Admin   methylPREDNISolone acetate (DEPO-MEDROL) injection 40 mg  40 mg Intramuscular Once Nicole Chambers, MD       Allergies: Allergies  Allergen Reactions   Budesonide-Formoterol Fumarate Other (See Comments)    Causes Hypertension   Other     Tree nuts   Penicillins Hives   Raspberry Hives   Spiriva Respimat [Tiotropium Bromide Monohydrate] Other (See Comments)    Pt states medication causes dizziness   Albuterol Palpitations   Losartan Rash   I reviewed her past medical history, social history, family history, and environmental history and no significant  changes have been reported from her previous visit.  Review of Systems-negative except as per HPI  Objective: Physical Exam Not obtained as encounter was done via telephone.  Patient talking in full sentences, sounds extremely congested.  Previous notes and tests were reviewed.  I discussed the assessment and treatment plan with the patient. The patient was provided an opportunity to ask questions and all were answered. The patient agreed with the plan and demonstrated an understanding of the instructions. After visit summary/patient instructions available via mychart.   The patient was advised to call back or seek an in-person evaluation if the symptoms worsen or if the condition fails to improve as anticipated.  I provided 5 minutes of non-face-to-face time during this encounter.  It was my pleasure to participate in [place] care today. Please feel free to contact me with any questions or concerns.   Sincerely,  Sigurd Sos, MD [facility]   All[facility] and [place]  [employer] office: [place] office: [phone]

## 2022-02-04 NOTE — Progress Notes (Unsigned)
PATIENT: Nicole Mendoza DOB: 13-Nov-1990  REASON FOR VISIT: follow up HISTORY FROM: patient  Virtual Visit via Telephone Note  I connected with Nicole Mendoza on 02/05/22 at  8:00 AM EDT by telephone and verified that I am speaking with the correct person using two identifiers.   I discussed the limitations, risks, security and privacy concerns of performing an evaluation and management service by telephone and the availability of in person appointments. I also discussed with the patient that there may be a patient responsible charge related to this service. The patient expressed understanding and agreed to proceed.   History of Present Illness:  02/05/2022 ALL ([facility]): Nicole Mendoza returns for follow up for OSA on CPAP. She continues to do very well. She is tolerating pressure settings. She is using CPAP nightly for about 7 hours. She denies concerns, today.     01/30/21 ALL ([facility]): Nicole Mendoza returns for follow up for OSA on CPAP and post Covid anosmia and ageusia. She continues CPAP nightly and feels she is doing well. 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 She reports doing well on CPAP. She is using her machine every night. She feels that she is doing better post Covid in 2020 with recurrent viral symptoms in 09/2020. She did not start carbamazepine. She does have difficulty waking in the mornings. Her husband reports trying to wake her up for over an hour, recently. She is able to maintain responsibilities and lifestyle.     01/12/2020 CD:  Nicole Mendoza is a 31 y.o. year old White or Caucasian female patient seen here upon PCP  referral on 01/12/2020 from Lazaro Arms, NP .   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 Rexene Alberts the same month she contracted Covid.  She became very short of breath and since she had an underlying condition of asthma struggled for  over 10 weeks with significant respiratory difficulties, she remains extremely fatigued with myalgia, limited exercise tolerance and she was also evaluated for POTS.  Autoimmune disorders work-up was negative by rheumatology cardiology.  She reports ongoing smelling smoke and the character of her olfactory triggers a set of foot burning food or meat.  She also becomes very nauseated when she smells ciggarette smoke , also there is no source of the "burning" smells.    I have the pleasure of seeing Nicole Anchors, RN  today, a right-handed White or Caucasian female with a loss of taste and smell after COVID 19, she has a  has a past medical history of Allergic rhinitis, Angio-edema, Asthma, Fatty liver, GERD (gastroesophageal reflux disease), Hypertension, Recurrent upper respiratory infection (URI), and Tachycardia.    Dr. De Nurse tested the patient for sleep apnea the results were positive and she has been using CPAP over 11 months now.  She is compliant.  10/07/2019 ALL: Nicole Mendoza is a 31 y.o. female here today for follow up of OSA on CPAP.  She reports that she is doing very well with CPAP therapy.  She is using her machine every night.  She does continue close follow-up with primary care post Covid.  She continues to struggle with intermittent fatigue and pain.  She is now working with physical therapy to help with strengthening and endurance.   Compliance report dated 09/06/2019 through 10/05/2019 reveals that she is used CPAP every night for compliance of 100%.  She has used CPAP  greater than 4 hours every night.  Average usage is 7 hours and 30 minutes.  Residual AHI 0.4 on 5 to 10 cm of water and an EPR of 1.  There was no significant leak noted.  Observations/Objective:  Generalized: Well developed, in no acute distress  Mentation: Alert oriented to time, place, history taking. Follows all commands speech and language fluent   Assessment and Plan:  31 y.o. year old female  has a past medical  history of Allergic rhinitis, Allergy, Angio-edema, Asthma, Dysmenorrhea, Fatty liver, GERD (gastroesophageal reflux disease), Hypertension, Recurrent upper respiratory infection (URI), Sleep apnea, and Tachycardia. here with    ICD-10-CM   1. OSA on CPAP  G47.33 For home use only DME continuous positive airway pressure (CPAP)   Z99.89       Nicole Mendoza is doing well on CPAP therapy. Compliance report shows excellent compliance. She was encouraged to continue therapy nightly for at least 4 hours. She will focus on healthy lifestyle habits. She will follow up with me in 1 year.    Orders Placed This Encounter  Procedures   For home use only DME continuous positive airway pressure (CPAP)    Supplies    Order Specific Question:   Length of Need    Answer:   Lifetime    Order Specific Question:   Patient has OSA or probable OSA    Answer:   Yes    Order Specific Question:   Is the patient currently using CPAP in the home    Answer:   Yes    Order Specific Question:   Settings    Answer:   Other see comments    Order Specific Question:   CPAP supplies needed    Answer:   Mask, headgear, cushions, filters, heated tubing and water chamber    No orders of the defined types were placed in this encounter.    Follow Up Instructions:  I discussed the assessment and treatment plan with the patient. The patient was provided an opportunity to ask questions and all were answered. The patient agreed with the plan and demonstrated an understanding of the instructions.   The patient was advised to call back or seek an in-person evaluation if the symptoms worsen or if the condition fails to improve as anticipated.  I provided 15 minutes of non-face-to-face time during this encounter. Patient located at their place of residence during [place] visit. Provider is in the office.    Debbora Presto, NP

## 2022-02-04 NOTE — Addendum Note (Signed)
Addended by: Felipa Emory on: 02/04/2022 03:42 PM   Modules accepted: Orders

## 2022-02-05 ENCOUNTER — Encounter: Payer: Self-pay | Admitting: Family Medicine

## 2022-02-05 ENCOUNTER — Ambulatory Visit (INDEPENDENT_AMBULATORY_CARE_PROVIDER_SITE_OTHER): Payer: 59 | Admitting: Bariatrics

## 2022-02-05 ENCOUNTER — Telehealth (INDEPENDENT_AMBULATORY_CARE_PROVIDER_SITE_OTHER): Payer: 59 | Admitting: Family Medicine

## 2022-02-05 DIAGNOSIS — G4733 Obstructive sleep apnea (adult) (pediatric): Secondary | ICD-10-CM

## 2022-02-05 DIAGNOSIS — Z9989 Dependence on other enabling machines and devices: Secondary | ICD-10-CM

## 2022-02-05 NOTE — Progress Notes (Signed)
New order faxed to DME: [facility]   [phone]  ? ?Fax confirmation received  ?

## 2022-02-06 ENCOUNTER — Encounter (INDEPENDENT_AMBULATORY_CARE_PROVIDER_SITE_OTHER): Payer: Self-pay | Admitting: Family Medicine

## 2022-02-06 ENCOUNTER — Ambulatory Visit (INDEPENDENT_AMBULATORY_CARE_PROVIDER_SITE_OTHER): Payer: 59 | Admitting: Family Medicine

## 2022-02-06 ENCOUNTER — Telehealth (INDEPENDENT_AMBULATORY_CARE_PROVIDER_SITE_OTHER): Payer: 59 | Admitting: Family Medicine

## 2022-02-06 DIAGNOSIS — E559 Vitamin D deficiency, unspecified: Secondary | ICD-10-CM | POA: Diagnosis not present

## 2022-02-06 DIAGNOSIS — Z6841 Body Mass Index (BMI) 40.0 and over, adult: Secondary | ICD-10-CM

## 2022-02-06 DIAGNOSIS — F509 Eating disorder, unspecified: Secondary | ICD-10-CM | POA: Diagnosis not present

## 2022-02-06 DIAGNOSIS — E669 Obesity, unspecified: Secondary | ICD-10-CM

## 2022-02-06 MED ORDER — CHOLECALCIFEROL 100 MCG (4000 UT) PO CAPS: 1.0000 | ORAL_CAPSULE | Freq: Every day | ORAL | 0 refills | Status: AC

## 2022-02-06 NOTE — Progress Notes (Signed)
TeleHealth Visit:  This visit was completed with telemedicine (audio/video) technology. Nicole Mendoza has verbally consented to this TeleHealth visit. The patient is located at home, the provider is located at home. The participants in this visit include the listed provider and patient. The visit was conducted today via MyChart video.  OBESITY Nicole Mendoza is here to discuss her progress with her obesity treatment plan along with follow-up of her obesity related diagnoses.   Today's visit was # 3 Starting weight: 301 lbs Starting date: 12/26/2021 Weight at last in office visit: 299 lbs on 01/09/22 Total weight loss: 2 lbs at last in office visit on 01/09/22. Today's reported weight: 297 lbs   Nutrition Plan: the Category 3 Plan with lunch options and 6 oz of protein at lunch Hunger is moderately controlled.   Interim History: Nicole Mendoza does not feel she is doing very well on the plan.  However she is adhering to plan breakfast and lunch.  She struggles more with dinner due to being busy with her young 61 88-year-old and 57-monthold baby that she is fostering.  She also works full-time and her husband has 2 jobs. Feels she has a component of stress eating. She has noticed that she feels better when she adheres to the plan.  Assessment/Plan:  1. Eating disorder/emotional eating MDamyiahsays that she has issues with stress eating.  After she started this plan she realized that her coping mechanism had been taken away which has been hard.  She sees a counselor regularly and they are starting to work on her stress eating issue but she still would like to see Dr. BMallie Mussel  She prefers to avoid medication at this time. Currently this is poorly controlled. Overall mood is stable.  Medication(s): None  Plan: Refer to Dr. BMallie Musselat next in office visit so that patient can complete necessary paperwork. Consider bupropion in the future if needed. She will continue to follow-up with her current counselor  also.  2. Vitamin D Deficiency Vitamin D is not at goal of 50.  Last vitamin D was below goal at 25.2 on 12/26/2021. She was told at last office visit to take 4000 IU vitamin D3 daily.  She has since incorporated this into her medicine regimen. Lab Results  Component Value Date   VD25OH 25.2 (L) 12/26/2021   VD25OH 41.0 09/01/2019    Plan: Continue vitamin D3 4000 IU daily.  3. Obesity: Current BMI 45.5 [name] is currently in the action stage of change. As such, her goal is to continue with weight loss efforts.  She has agreed to the Category 3 Plan and keeping a food journal and adhering to recommended goals of 450-600 calories and 40 gms protein.   Handouts sent via MyChart: dining out guide, breakfast options, and recipes. Discussed asking her husband to grill at least once per week with enough for leftovers.  Exercise goals: as is  Behavioral modification strategies: increasing lean protein intake, decreasing simple carbohydrates, meal planning and cooking strategies, keeping healthy foods in the home, and emotional eating strategies.  MAnissahas agreed to follow-up with our clinic in 3 weeks and 6 weeks with Dr. ORaliegh Scarlet.Marland Kitchen  No orders of the defined types were placed in this encounter.   There are no discontinued medications.   Meds ordered this encounter  Medications   Cholecalciferol 100 MCG (4000 UT) CAPS    Sig: Take 1 capsule (4,000 Units total) by mouth daily.    Dispense:  30 capsule    Refill:  0  Order Specific Question:   Supervising Provider    Answer:   Starlyn Skeans [[id number]]      Objective:   VITALS: Per patient if applicable, see vitals. GENERAL: Alert and in no acute distress. CARDIOPULMONARY: No increased WOB. Speaking in clear sentences.  PSYCH: Pleasant and cooperative. Speech normal rate and rhythm. Affect is appropriate. Insight and judgement are appropriate. Attention is focused, linear, and appropriate.  NEURO: Oriented as arrived to  appointment on time with no prompting.   Lab Results  Component Value Date   CREATININE 0.68 12/26/2021   BUN 13 12/26/2021   NA 139 12/26/2021   K 4.1 12/26/2021   CL 100 12/26/2021   CO2 21 12/26/2021   Lab Results  Component Value Date   ALT 31 12/26/2021   AST 24 12/26/2021   ALKPHOS 98 12/26/2021   BILITOT 0.5 12/26/2021   Lab Results  Component Value Date   HGBA1C 5.5 12/26/2021   HGBA1C 5.6 06/22/2021   Lab Results  Component Value Date   INSULIN 23.1 12/26/2021   Lab Results  Component Value Date   TSH 2.020 12/26/2021   Lab Results  Component Value Date   CHOL 158 12/26/2021   HDL 52 12/26/2021   LDLCALC 90 12/26/2021   TRIG 87 12/26/2021   Lab Results  Component Value Date   WBC 8.3 12/26/2021   HGB 13.6 12/26/2021   HCT 42.0 12/26/2021   MCV 86 12/26/2021   PLT 291 12/26/2021   No results found for: "IRON", "TIBC", "FERRITIN" Lab Results  Component Value Date   VD25OH 25.2 (L) 12/26/2021   VD25OH 41.0 09/01/2019    Attestation Statements:   Reviewed by clinician on day of visit: allergies, medications, problem list, medical history, surgical history, family history, social history, and previous encounter notes.

## 2022-02-19 ENCOUNTER — Telehealth: Payer: Self-pay

## 2022-02-19 NOTE — Telephone Encounter (Signed)
Pa approved thru [employer] and pt notified

## 2022-02-19 NOTE — Telephone Encounter (Signed)
Pa submitted thru cover my meds for results waiting on response

## 2022-02-20 ENCOUNTER — Telehealth: Payer: Self-pay | Admitting: Allergy & Immunology

## 2022-02-20 NOTE — Telephone Encounter (Signed)
Nicole Mendoza called in very upset and states her husband has covid and she was exposed.  Nicole Mendoza states that when she had covid the last 2 times, she almost died twice and she is in panic mode and would really like for Dr. Ernst Bowler to please call her.

## 2022-02-20 NOTE — Telephone Encounter (Signed)
Nicole Mendoza -I spoke with her since I am in the office with [name] today.  She has tested twice and is negative, she is currently asymptomatic.  It sounds like she did have some sort of viral illness last week end as did her son, but they are both negative for COVID currently and feeling better.  We did discuss having her go ahead and start her Pulmicort twice a day for the next week or so, keeping her husband quarantined, and if she does develop symptoms or have a positive test she will call us to start Paxlovid.  Her husband was started on Paxlovid today.  She is also seriously thinking about Hizentra.  She will call [facility] to get this started, but since it has been so long since her initial contact, I am letting you know in case she needs the process restarted.

## 2022-02-20 NOTE — Telephone Encounter (Signed)
I had an email conversation with her this weekend, where she did sing your praises! She needed to start her Hizentra years ago. Thanks for taking such good care of her!   Salvatore Marvel, MD [facility]

## 2022-02-22 ENCOUNTER — Telehealth: Payer: Self-pay

## 2022-02-22 NOTE — Telephone Encounter (Signed)
Spoke to patient and informed her of the Zpack she should take. She understood  [place] [phone]

## 2022-02-22 NOTE — Telephone Encounter (Signed)
Please let her know that I can't see her results from UC---but if she is strep positive, we can treat her with a z-pack. (500 mg on day 1 then 250 mg x 4 days).  She would pause her current prophylaxis and then restart a week later.

## 2022-02-22 NOTE — Telephone Encounter (Signed)
Went to Urgent Care and has strep throat and would like Dr. [name] to go over her results of the strep because she knows her medical history and would like her recommendation.  Nicole Mendoza [phone]

## 2022-02-28 ENCOUNTER — Ambulatory Visit (INDEPENDENT_AMBULATORY_CARE_PROVIDER_SITE_OTHER): Payer: 59 | Admitting: Family Medicine

## 2022-02-28 ENCOUNTER — Encounter (INDEPENDENT_AMBULATORY_CARE_PROVIDER_SITE_OTHER): Payer: Self-pay | Admitting: Family Medicine

## 2022-02-28 VITALS — BP 138/82 | HR 82 | Temp 98.2°F | Ht 68.0 in | Wt 293.0 lb

## 2022-02-28 DIAGNOSIS — E669 Obesity, unspecified: Secondary | ICD-10-CM | POA: Diagnosis not present

## 2022-02-28 DIAGNOSIS — E559 Vitamin D deficiency, unspecified: Secondary | ICD-10-CM | POA: Diagnosis not present

## 2022-02-28 DIAGNOSIS — I10 Essential (primary) hypertension: Secondary | ICD-10-CM

## 2022-02-28 DIAGNOSIS — Z6841 Body Mass Index (BMI) 40.0 and over, adult: Secondary | ICD-10-CM

## 2022-02-28 DIAGNOSIS — K219 Gastro-esophageal reflux disease without esophagitis: Secondary | ICD-10-CM

## 2022-03-08 NOTE — Progress Notes (Unsigned)
Chief Complaint:   OBESITY Nicole Mendoza is here to discuss her progress with her obesity treatment plan along with follow-up of her obesity related diagnoses. Nicole Mendoza is on the Category 3 Plan and journaling 450-600 calories and 40 grams of protein for dinner daily and states she is following her eating plan approximately 0% of the time. Nicole Mendoza states she is walking or using her stationary bike for 5 minutes 2 times per week.  Today's visit was #: 4 Starting weight: 301 lbs Starting date: 12/26/2021 Today's weight: 293 lbs Today's date: 02/28/2022 Total lbs lost to date: 8 lbs Total lbs lost since last in-office visit: 6 lbs  Interim History: Nicole Mendoza's husband is at home with Covid now. She is positive for strep. Nicole Mendoza lost 12.4 lbs in fat mass. She reports to love journaling due to "it feels in control more" and more accountability. Her last in office visit was on 01/09/2022 and saw Bartolo Darter, FNP virtually on 02/06/2022 and all notes were reviewed.  Subjective:   1. Essential hypertension Dinita is currently taking Metoprolol and is asymptomatic with no concerns.Goal is HgbA1c < 5.7, fasting insulin closer to 5.    Lab Results  Component Value Date   HGBA1C 5.5 12/26/2021   Lab Results  Component Value Date   INSULIN 23.1 12/26/2021   2. Gastroesophageal reflux disease, unspecified whether esophagitis present Prue denies to have any symptoms or concerns today. She is currently taking Pepcid and it works well. She reports symptoms are better with her weight loss.  3. Vitamin D deficiency Nicole Mendoza is taking OTC Vitamin D 4,000 units daily. Her last Vitamin D level was 25.5 on 12/26/2021.  Assessment/Plan:  No orders of the defined types were placed in this encounter.   There are no discontinued medications.   No orders of the defined types were placed in this encounter.    1. Essential hypertension Nicole Mendoza is working on healthy weight loss and exercise to improve blood pressure  control. We will watch for signs of hypotension as she continues her lifestyle modifications. Nicole Mendoza's blood pressure is at goal. She will continue with medications and decrease her salt intake.  2. Gastroesophageal reflux disease, unspecified whether esophagitis present Nicole Mendoza has a history of heartburn.  Symptoms are essentially controlled and she has no concerns today about this condition or it's treatment regimen. Discussed treatment options including over-the-counter medications and prescription ones with patient.  Will continue medications per PCP and we can refill medications if pt needs it.   Lifestyle modifications are the first line treatment for this issue. Some of these include not eating within 2-3 hours of lying down, avoiding trigger foods, decrease caffeine intake, decrease abdominal girth, no tobacco use (if applicable).  Nicole Mendoza will continue to work on diet, exercise and weight loss efforts.  Orders and follow up as documented in medical records.  Nicole Mendoza will continue with medication and weight loss.  3. Vitamin D deficiency Reminded Nicole Mendoza of the importance of regular use of supplement. Low Vitamin D level contributes to fatigue and are associated with obesity, breast, and colon cancer. She agrees to continue to take prescription Vitamin D '@50'$ ,000 IU every week and will follow-up for routine testing of Vitamin D, at least 2-3 times per year to avoid over-replacement.  4. Obesity, current BMI 44.6 Nicole Mendoza is currently in the action stage of change. As such, her goal is to continue with weight loss efforts. She has agreed to keeping a food journal and adhering to  recommended goals of 1750-1850 calories and 130+ grams of  protein daily.   Exercise goals: Nicole Mendoza is to increase her exercise as tolerated.  Behavioral modification strategies: no skipping meals, better snacking choices, and keeping a strict food journal.  Nicole Mendoza has agreed to follow-up with our clinic in 2-3 weeks.  She was informed of the importance of frequent follow-up visits to maximize her success with intensive lifestyle modifications for her multiple health conditions.   Objective:   Blood pressure 138/82, pulse 82, temperature 98.2 F (36.8 C), height '5\' 8"'$  (1.727 m), weight 293 lb (132.9 kg), SpO2 99 %. Body mass index is 44.55 kg/m.  General: Cooperative, alert, well developed, in no acute distress. HEENT: Conjunctivae and lids unremarkable. Cardiovascular: Regular rhythm.  Lungs: Normal work of breathing. Neurologic: No focal deficits.   Lab Results  Component Value Date   CREATININE 0.68 12/26/2021   BUN 13 12/26/2021   NA 139 12/26/2021   K 4.1 12/26/2021   CL 100 12/26/2021   CO2 21 12/26/2021   Lab Results  Component Value Date   ALT 31 12/26/2021   AST 24 12/26/2021   ALKPHOS 98 12/26/2021   BILITOT 0.5 12/26/2021   Lab Results  Component Value Date   HGBA1C 5.5 12/26/2021   HGBA1C 5.6 06/22/2021   Lab Results  Component Value Date   INSULIN 23.1 12/26/2021   Lab Results  Component Value Date   TSH 2.020 12/26/2021   Lab Results  Component Value Date   CHOL 158 12/26/2021   HDL 52 12/26/2021   LDLCALC 90 12/26/2021   TRIG 87 12/26/2021   Lab Results  Component Value Date   VD25OH 25.2 (L) 12/26/2021   VD25OH 41.0 09/01/2019   Lab Results  Component Value Date   WBC 8.3 12/26/2021   HGB 13.6 12/26/2021   HCT 42.0 12/26/2021   MCV 86 12/26/2021   PLT 291 12/26/2021   No results found for: "IRON", "TIBC", "FERRITIN"  Attestation Statements:   Reviewed by clinician on day of visit: allergies, medications, problem list, medical history, surgical history, family history, social history, and previous encounter notes.  Time spent on visit including pre-visit chart review and post-visit care and charting was 30 minutes.   ILennette Bihari, CMA, am acting as transcriptionist for Dr. Raliegh Scarlet, DO.   I have reviewed the above documentation for accuracy  and completeness, and I agree with the above. Marjory Sneddon, D.O.  The [place] was signed into law in 2016 which includes the topic of electronic health records.  This provides immediate access to information in MyChart.  This includes consultation notes, operative notes, office notes, lab results and pathology reports.  If you have any questions about what you read please let us know at your next visit so we can discuss your concerns and take corrective action if need be.  We are right here with you.

## 2022-03-11 ENCOUNTER — Encounter: Payer: Self-pay | Admitting: Family Medicine

## 2022-03-11 ENCOUNTER — Ambulatory Visit (INDEPENDENT_AMBULATORY_CARE_PROVIDER_SITE_OTHER): Payer: 59 | Admitting: Family Medicine

## 2022-03-11 VITALS — BP 132/90 | HR 88 | Temp 97.9°F | Ht 68.0 in | Wt 300.8 lb

## 2022-03-11 DIAGNOSIS — I10 Essential (primary) hypertension: Secondary | ICD-10-CM | POA: Diagnosis not present

## 2022-03-11 DIAGNOSIS — M797 Fibromyalgia: Secondary | ICD-10-CM | POA: Diagnosis not present

## 2022-03-11 DIAGNOSIS — J4540 Moderate persistent asthma, uncomplicated: Secondary | ICD-10-CM | POA: Diagnosis not present

## 2022-03-11 DIAGNOSIS — M26622 Arthralgia of left temporomandibular joint: Secondary | ICD-10-CM

## 2022-03-11 DIAGNOSIS — R252 Cramp and spasm: Secondary | ICD-10-CM | POA: Diagnosis not present

## 2022-03-11 DIAGNOSIS — Z6841 Body Mass Index (BMI) 40.0 and over, adult: Secondary | ICD-10-CM

## 2022-03-11 LAB — COMPREHENSIVE METABOLIC PANEL
ALT: 22 U/L (ref 0–35)
AST: 16 U/L (ref 0–37)
Albumin: 4.1 g/dL (ref 3.5–5.2)
Alkaline Phosphatase: 81 U/L (ref 39–117)
BUN: 17 mg/dL (ref 6–23)
CO2: 27 mEq/L (ref 19–32)
Calcium: 9.3 mg/dL (ref 8.4–10.5)
Chloride: 103 mEq/L (ref 96–112)
Creatinine, Ser: 0.64 mg/dL (ref 0.40–1.20)
GFR: 118.15 mL/min (ref >60.00)
Glucose, Bld: 89 mg/dL (ref 70–99)
Potassium: 3.6 mEq/L (ref 3.5–5.1)
Sodium: 138 mEq/L (ref 135–145)
Total Bilirubin: 0.5 mg/dL (ref 0.2–1.2)
Total Protein: 7.7 g/dL (ref 6.0–8.3)

## 2022-03-11 NOTE — Progress Notes (Signed)
[facility] [address] [place] [address] Dept: [phone] Dept Fax: [phone]  Chronic Care Office Visit  Subjective:    Patient ID: Nicole Mendoza, female    DOB: Jan 24, 1991, 31 y.o..   MRN: [id number]  Chief Complaint  Patient presents with   Follow-up    4 month F/U. Needs a renewal letter for work.    History of Present Illness:  Patient is in today for reassessment of chronic medical issues.  Nicole Mendoza has a history of hypertension. 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    Nicole Mendoza has moderate persistent asthma and does see Dr. Ernst Bowler (allergist) related to this. She is treated with a combination of therapies for her allergies and asthma, including Xopenox (inhaler and nebs), Asmanex, and Singulair. She also occasionally uses a budesonide neb. For her allergies, she also uses cetirizine, and a fluticasone nasal spray Truett Perna). She has PRN hydroxyzine for itching and occasioanl anxiety. She feels like her respiratory/allergy issues are stable currently. Dr. Ernst Bowler has recommended she take Hizentra (immune globulin), but she has still not started this.   Nicole Mendoza was diagnosed earlier this year with fibromyalgia. I had assisted her with requesting a reasonable accomodation, allowing her to do telework up to 75% of the time. This has allowed her to more effectively deal with her fatigue issues. Recently, she has had to be out more each day, as part of her job. She finds this has made her fatigue more pronounced and has impacted her ability to exercise.   Nicole Mendoza has morbid obesity.  She is now engaged in the [facility]. They are primarily focused on her having a carbohydrate and protein goal for the day. She is very pleased with the individualized care they are providing.    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   Recurrent sinusitis 11/09/2020   Pre-nodular edema of the vocal folds 11/09/2020   Laryngospasms 11/09/2020   ASCUS of cervix with negative high risk HPV 07/10/2020   Cardiac murmur 09/02/2019   Tachycardia 09/01/2019   Salivary stone 09/01/2019   Anaphylactic shock due to adverse food reaction 01/22/2019   Seasonal and perennial allergic rhinitis 01/22/2019   Morbid obesity with BMI of 45.0-49.9, adult ([place]) 10/28/2017   Moderate persistent asthma without complication 51/70/0174   Essential hypertension 04/22/2017   Dysmenorrhea 06/26/2016   Menorrhagia with regular cycle 06/26/2016   Gastroesophageal reflux disease 12/22/2015   Vitamin D deficiency 11/02/2014   Past Surgical History:  Procedure Laterality Date   NO PAST SURGERIES     Family History  Adopted: Yes  Problem Relation Age of Onset   Asthma Mother    Diabetes Mother    High blood pressure Mother    Heart disease Mother    Bipolar disorder Mother    Sleep apnea Mother    Obesity Mother  Allergic rhinitis Father    Asthma Father    Diabetes Father    High blood pressure Father    Hyperlipidemia Father    Obesity Father    Allergic rhinitis Sister    Asthma Sister    Angioedema Neg Hx    Atopy Neg Hx    Eczema Neg Hx    Immunodeficiency Neg Hx    Urticaria Neg Hx    Outpatient Medications Prior to Visit  Medication Sig Dispense Refill   Ascorbic  Acid (SUPER C COMPLEX PO)      azithromycin (ZITHROMAX) 500 MG tablet Take by mouth.     budesonide (PULMICORT) 0.5 MG/2ML nebulizer solution Take 0.5 mg by nebulization as needed.     cetirizine (ZYRTEC) 10 MG tablet Take 10 mg by mouth daily.     Cholecalciferol 100 MCG (4000 UT) CAPS Take 1 capsule (4,000 Units total) by mouth daily. 30 capsule 0   dexlansoprazole (DEXILANT) 60 MG capsule Take 1 capsule (60 mg total) by mouth daily. 30 capsule 5   EPINEPHrine (AUVI-Q) 0.3 mg/0.3 mL IJ SOAJ injection Use as directed for severe allergic reactions 4 each 1   famotidine (PEPCID) 20 MG tablet Take 1 tablet (20 mg total) by mouth 2 (two) times daily. 180 tablet 1   ferrous sulfate 325 (65 FE) MG tablet 3 (three) times a week.      Fluticasone Propionate (XHANCE) 93 MCG/ACT EXHU Place 93 mcg into the nose 2 (two) times daily. 2 sprays twice daily     levalbuterol (XOPENEX) 1.25 MG/3ML nebulizer solution USE 1 VIAL BY NEBULIZATION EVERY 6 (SIX) HOURS AS NEEDED FOR WHEEZING. 90 mL 1   metoprolol succinate (TOPROL-XL) 100 MG 24 hr tablet Take 1 tablet (100 mg total) by mouth daily. Take with or immediately following a meal. 90 tablet 3   mometasone (ASMANEX, 120 METERED DOSES,) 220 MCG/ACT inhaler INHALE 2 PUFFS INTO THE LUNGS TWICE A DAY 1 each 4   montelukast (SINGULAIR) 10 MG tablet Take 1 tablet (10 mg total) by mouth daily. 90 tablet 1   norethindrone (MICRONOR) 0.35 MG tablet Take 0.35 mg by mouth.     ondansetron (ZOFRAN) 4 MG tablet Take 1 tablet (4 mg total) by mouth every 8 (eight) hours as needed for nausea or vomiting. 20 tablet 0   Prenatal Vit-Fe Fumarate-FA (MULTIVITAMIN-PRENATAL) 27-0.8 MG TABS tablet Take 1 tablet by mouth daily at 12 noon.     Probiotic Product ([place]) CAPS      No facility-administered medications prior to visit.   Allergies  Allergen Reactions   Budesonide-Formoterol Fumarate Other (See Comments)    Causes Hypertension   Other     Tree nuts    Penicillins Hives   Raspberry Hives   Spiriva Respimat [Tiotropium Bromide Monohydrate] Other (See Comments)    Pt states medication causes dizziness   Albuterol Palpitations   Losartan Rash      Objective:   Today's Vitals   03/11/22 0851  BP: (!) 132/90  Pulse: 88  Temp: 97.9 F (36.6 C)  TempSrc: Temporal  SpO2: 99%  Weight: (!) 300 lb 12.8 oz (136.4 kg)  Height: '5\' 8"'$  (1.727 m)   Body mass index is 45.74 kg/m.   General: Well developed, well nourished. No acute distress. HEENT: Mild popping of the TMJ joints. Not tender to palpation. Lungs: Clear to auscultation bilaterally. No wheezing, rales or rhonchi. Psych: Alert and oriented. Normal mood and affect.  Health Maintenance Due  Topic Date Due   Hepatitis C Screening  Never done     Assessment & Plan:   1. Essential hypertension Blood Pressure is adequately controlled, considering she did not take her metoprolol this morning. Plan to continue metoprolol 100 mg daily.  2. Moderate persistent asthma without complication Stable on her current regimen. She will continue to follow with Dr. Ernst Bowler.  3. Morbid obesity with BMI of 45.0-49.9, adult ([place]) Weight is stable. Continue to work with the [facility].  4. Fibromyalgia Stable. I will renew the recommendation for reasonable accomodation.  5. Leg cramping Nicole Mendoza notes some recent leg cramping. I will check screening labs for possible underlying issues causing this.  - Comprehensive metabolic panel  6. Arthralgia of left temporomandibular joint Nicole Mendoza noted pain this morning int he left jaw, causing her to have difficulty opening her mouth. She used heat over this (compress) and found this did help. I recommend she avoid chewing gum and eating hard foods. We will follow.  Return in about 3 months (around 06/10/2022) for Reassessment.   [name], MD

## 2022-03-13 NOTE — Progress Notes (Signed)
TeleHealth Visit:  This visit was completed with telemedicine (audio/video) technology. Nicole Mendoza has verbally consented to this TeleHealth visit. The patient is located at home, the provider is located at home. The participants in this visit include the listed provider and patient. The visit was conducted today via MyChart video.  OBESITY Nicole Mendoza is here to discuss her progress with her obesity treatment plan along with follow-up of her obesity related diagnoses.   Today's visit was # 5 Starting weight: 301 lbs Starting date: 12/26/2021 Weight at last in office visit: 293 lbs on 02/28/22 Total weight loss: 8 lbs at last in office visit on 02/28/22. Today's reported weight:  No weight reported.  Nutrition Plan: keeping a food journal and adhering to recommended goals of 1750-1850 calories and 130+ grams of  protein daily. .   Current exercise:  Oshun states she is walking for 10-20 minutes 4 times per week.  Interim History: Nicole Mendoza was placed on journaling at her last visit and she said she did very well for short while but then fell "off the wagon".  She had a lot of stressors at home-she had strep, husband had COVID, the baby had a ear infection.  Since she has been off plan her hunger and cravings have been worse.  She feels like she regained all the weight she had lost. Weekends are difficult for her because she is so busy doing tasks that she did not have time to do during the week.  Sunday is church day-she goes to church 2 times and her family tends to eat out.  She is trying reduce carb intake overall.  She has a grocery order put in for today and plans on restarting the plan.  Her family has started taking a walk several days per week to go get the mail.  Assessment/Plan:  1. Prediabetes [name] has a diagnosis of prediabetes based on her elevated HgA1c. Medication(s): none Lab Results  Component Value Date   HGBA1C 5.5 12/26/2021   Lab Results  Component Value Date    INSULIN 23.1 12/26/2021    Plan: Discussed metformin briefly but she declines due to her IBS.Marland Kitchen She will work on increasing protein, decreasing carbohydrates.   2. Obesity: Current BMI 44.6 Nicole Mendoza is currently in the action stage of change. As such, her goal is to continue with weight loss efforts.  She has agreed to the Category 3 Plan and keeping a food journal and adhering to recommended goals of 1750-1850 calories and 130 + gms protein.   1.  Pack a healthy lunch when she is running errands on Saturday. 2.  Journal on Sundays to accommodate eating out. 3.  Consider switching switching lunch and dinner when eating out. 4.  Encouragement provided-told her that this is all part of the journey-highs and lows. 5.  Work on Field seismologist.  Exercise goals: as is.  Behavioral modification strategies: increasing lean protein intake, decreasing simple carbohydrates, meal planning and cooking strategies, keeping healthy foods in the home, emotional eating strategies, and planning for success.  Marchel has agreed to follow-up with our clinic in 2 weeks.   No orders of the defined types were placed in this encounter.   There are no discontinued medications.   No orders of the defined types were placed in this encounter.     Objective:   VITALS: Per patient if applicable, see vitals. GENERAL: Alert and in no acute distress. CARDIOPULMONARY: No increased WOB. Speaking in clear sentences.  PSYCH: Pleasant and cooperative. Speech normal  rate and rhythm. Affect is appropriate. Insight and judgement are appropriate. Attention is focused, linear, and appropriate.  NEURO: Oriented as arrived to appointment on time with no prompting.   Lab Results  Component Value Date   CREATININE 0.64 03/11/2022   BUN 17 03/11/2022   NA 138 03/11/2022   K 3.6 03/11/2022   CL 103 03/11/2022   CO2 27 03/11/2022   Lab Results  Component Value Date   ALT 22 03/11/2022   AST 16 03/11/2022   ALKPHOS 81  03/11/2022   BILITOT 0.5 03/11/2022   Lab Results  Component Value Date   HGBA1C 5.5 12/26/2021   HGBA1C 5.6 06/22/2021   Lab Results  Component Value Date   INSULIN 23.1 12/26/2021   Lab Results  Component Value Date   TSH 2.020 12/26/2021   Lab Results  Component Value Date   CHOL 158 12/26/2021   HDL 52 12/26/2021   LDLCALC 90 12/26/2021   TRIG 87 12/26/2021   Lab Results  Component Value Date   WBC 8.3 12/26/2021   HGB 13.6 12/26/2021   HCT 42.0 12/26/2021   MCV 86 12/26/2021   PLT 291 12/26/2021   No results found for: "IRON", "TIBC", "FERRITIN" Lab Results  Component Value Date   VD25OH 25.2 (L) 12/26/2021   VD25OH 41.0 09/01/2019    Attestation Statements:   Reviewed by clinician on day of visit: allergies, medications, problem list, medical history, surgical history, family history, social history, and previous encounter notes.  Time spent on visit including the items listed below was 33 minutes.  -preparing to see the patient (e.g., review of tests, history, previous notes) -obtaining and/or reviewing separately obtained history -counseling and educating the patient/family/caregiver -documenting clinical information in the electronic or other health record

## 2022-03-14 ENCOUNTER — Telehealth (INDEPENDENT_AMBULATORY_CARE_PROVIDER_SITE_OTHER): Payer: 59 | Admitting: Family Medicine

## 2022-03-14 ENCOUNTER — Encounter (INDEPENDENT_AMBULATORY_CARE_PROVIDER_SITE_OTHER): Payer: Self-pay | Admitting: Family Medicine

## 2022-03-14 ENCOUNTER — Ambulatory Visit (INDEPENDENT_AMBULATORY_CARE_PROVIDER_SITE_OTHER): Payer: 59 | Admitting: Family Medicine

## 2022-03-14 DIAGNOSIS — Z6841 Body Mass Index (BMI) 40.0 and over, adult: Secondary | ICD-10-CM | POA: Diagnosis not present

## 2022-03-14 DIAGNOSIS — R7303 Prediabetes: Secondary | ICD-10-CM | POA: Diagnosis not present

## 2022-03-14 DIAGNOSIS — E669 Obesity, unspecified: Secondary | ICD-10-CM | POA: Diagnosis not present

## 2022-03-15 ENCOUNTER — Ambulatory Visit: Payer: 59 | Admitting: Family Medicine

## 2022-03-18 ENCOUNTER — Other Ambulatory Visit: Payer: Self-pay | Admitting: Allergy & Immunology

## 2022-03-22 NOTE — Telephone Encounter (Addendum)
Asmanex (30 metered doses) 220 mcg/act PA....  KEY: [id number] PA Case ID: [id number] - Rx#: [id number] created 03/22/22.  Called [facility] [phone] - spoke to [place], [place] verified - advised previous PA for Asmanex 220 mcg/act doesn't expire until 05/28/22.  Called [facility] [facility] [address] - spoke to [place] verified - advised of the above - made sure they were processing for the 220 mc/act and not 120 mcg/act. Katrina advised she will take the 120 mcg/act out of patient's file immediately - apologized fr the confusion.  Called patient - DOB verified - advised of the above. Patient verbalized understanding, no further questions.

## 2022-03-23 ENCOUNTER — Other Ambulatory Visit: Payer: Self-pay | Admitting: Family Medicine

## 2022-03-23 DIAGNOSIS — R002 Palpitations: Secondary | ICD-10-CM

## 2022-03-27 ENCOUNTER — Other Ambulatory Visit: Payer: Self-pay | Admitting: *Deleted

## 2022-03-27 MED ORDER — ASMANEX (120 METERED DOSES) 220 MCG/ACT IN AEPB: 2.0000 | INHALATION_SPRAY | Freq: Two times a day (BID) | RESPIRATORY_TRACT | 5 refills | Status: DC

## 2022-03-28 ENCOUNTER — Ambulatory Visit (INDEPENDENT_AMBULATORY_CARE_PROVIDER_SITE_OTHER): Payer: 59 | Admitting: Family Medicine

## 2022-04-08 NOTE — Progress Notes (Deleted)
TeleHealth Visit:  This visit was completed with telemedicine (audio/video) technology. Nicole Mendoza has verbally consented to this TeleHealth visit. The patient is located at home, the provider is located at home. The participants in this visit include the listed provider and patient. The visit was conducted today via MyChart video.  OBESITY Nicole Mendoza is here to discuss her progress with her obesity treatment plan along with follow-up of her obesity related diagnoses.   Today's visit was # 5 Starting weight: 301 lbs Starting date: 12/26/2021 Weight at last in office visit: 293 lbs on 02/28/22 Total weight loss: 8 lbs at last in office visit on 02/28/22. Today's reported weight: *** lbs No weight reported.  Nutrition Plan: keeping a food journal and adhering to recommended goals of 1750-1850 calories and 130+ grams of  protein daily. .   Current exercise: {exercise types:16438} walking or using her stationary bike for 5 minutes 2 times per week.  Interim History: ***  Assessment/Plan:  1. ***  2. ***  3. ***  Obesity: Current BMI *** Nicole Mendoza {CHL AMB IS/IS NOT:210130109} currently in the action stage of change. As such, her goal is to {MWMwtloss#1:210800005}.  She has agreed to {MWMwtlossportion/plan2:23431}.   Exercise goals: {MWM EXERCISE RECS:23473}  Behavioral modification strategies: {MWMwtlossdietstrategies3:23432}.  Nicole Mendoza has agreed to follow-up with our clinic in {NUMBER 1-10:22536} weeks.   No orders of the defined types were placed in this encounter.   There are no discontinued medications.   No orders of the defined types were placed in this encounter.     Objective:   VITALS: Per patient if applicable, see vitals. GENERAL: Alert and in no acute distress. CARDIOPULMONARY: No increased WOB. Speaking in clear sentences.  PSYCH: Pleasant and cooperative. Speech normal rate and rhythm. Affect is appropriate. Insight and judgement are appropriate. Attention is  focused, linear, and appropriate.  NEURO: Oriented as arrived to appointment on time with no prompting.   Lab Results  Component Value Date   CREATININE 0.64 03/11/2022   BUN 17 03/11/2022   NA 138 03/11/2022   K 3.6 03/11/2022   CL 103 03/11/2022   CO2 27 03/11/2022   Lab Results  Component Value Date   ALT 22 03/11/2022   AST 16 03/11/2022   ALKPHOS 81 03/11/2022   BILITOT 0.5 03/11/2022   Lab Results  Component Value Date   HGBA1C 5.5 12/26/2021   HGBA1C 5.6 06/22/2021   Lab Results  Component Value Date   INSULIN 23.1 12/26/2021   Lab Results  Component Value Date   TSH 2.020 12/26/2021   Lab Results  Component Value Date   CHOL 158 12/26/2021   HDL 52 12/26/2021   LDLCALC 90 12/26/2021   TRIG 87 12/26/2021   Lab Results  Component Value Date   WBC 8.3 12/26/2021   HGB 13.6 12/26/2021   HCT 42.0 12/26/2021   MCV 86 12/26/2021   PLT 291 12/26/2021   No results found for: "IRON", "TIBC", "FERRITIN" Lab Results  Component Value Date   VD25OH 25.2 (L) 12/26/2021   VD25OH 41.0 09/01/2019    Attestation Statements:   Reviewed by clinician on day of visit: allergies, medications, problem list, medical history, surgical history, family history, social history, and previous encounter notes.  ***(delete if time-based billing not used) Time spent on visit including the items listed below was *** minutes.  -preparing to see the patient (e.g., review of tests, history, previous notes) -obtaining and/or reviewing separately obtained history -counseling and educating the patient/family/caregiver -documenting clinical information  in the electronic or other health record -ordering medications, tests, or procedures -independently interpreting results and communicating results to the patient/ family/caregiver -referring and communicating with other health care professionals  -care coordination

## 2022-04-10 ENCOUNTER — Telehealth (INDEPENDENT_AMBULATORY_CARE_PROVIDER_SITE_OTHER): Payer: 59 | Admitting: Family Medicine

## 2022-04-10 DIAGNOSIS — Z630 Problems in relationship with spouse or partner: Secondary | ICD-10-CM | POA: Diagnosis not present

## 2022-04-10 DIAGNOSIS — F419 Anxiety disorder, unspecified: Secondary | ICD-10-CM | POA: Diagnosis not present

## 2022-04-11 DIAGNOSIS — F419 Anxiety disorder, unspecified: Secondary | ICD-10-CM | POA: Diagnosis not present

## 2022-04-22 ENCOUNTER — Encounter: Payer: Self-pay | Admitting: Internal Medicine

## 2022-04-22 ENCOUNTER — Ambulatory Visit: Payer: BC Managed Care – PPO | Admitting: Internal Medicine

## 2022-04-22 VITALS — BP 134/84 | HR 90 | Temp 98.8°F | Resp 24

## 2022-04-22 DIAGNOSIS — H6502 Acute serous otitis media, left ear: Secondary | ICD-10-CM

## 2022-04-22 DIAGNOSIS — J4540 Moderate persistent asthma, uncomplicated: Secondary | ICD-10-CM

## 2022-04-22 MED ORDER — CEFDINIR 300 MG PO CAPS: 300.0000 mg | ORAL_CAPSULE | Freq: Two times a day (BID) | ORAL | 0 refills | Status: AC

## 2022-04-22 MED ORDER — FLUCONAZOLE 150 MG PO TABS: ORAL_TABLET | ORAL | 0 refills | Status: DC

## 2022-04-22 NOTE — Patient Instructions (Addendum)
Left Serous Otitis Media:  - take cefdinir 300 mg twice daily for 7 days - take with probiotics - take diflucan 150 mg once, then repeat dose 3 days later  Continue rest of allergy plan as prior.   Follow up : 6 months, sooner if needed.  It was a pleasure seeing you again in clinic today! Thank you for allowing me to participate in your care.

## 2022-04-22 NOTE — Progress Notes (Signed)
FOLLOW UP Date of Service/Encounter:  04/22/22   Subjective:  Nicole Mendoza (DOB: 11/08/1990) is a 31 y.o. female who [facility] in re-evaluation of the following: acute ear ache History obtained from: chart review and patient.  For Review, LV was on 02/04/22  with Dr.Angila Wombles seen for acute visit for sinus infection .  Today presents for follow-up. She has had ongoing left ear pain for one week which intensified over the past few days. No fevers. No other sick symptoms. Concerned she may have thrush as she saw white plaque in her mouth the other day. She is not using her azithromycin exactly as prescribed, but is using more than she was previously using. She doesn't like taking it because it causes her to have thrush and when she takes diflucan, it can cause heart palpitations when she is on azithromycin. Otherwise has been well. Asthma controlled, using albuterol less than once per week.   Allergies as of 04/22/2022       Reactions   Budesonide-formoterol Fumarate Other (See Comments)   Causes Hypertension   Other    Tree nuts   Penicillins Hives   Raspberry Hives   Spiriva Respimat [tiotropium Bromide Monohydrate] Other (See Comments)   Pt states medication causes dizziness   Albuterol Palpitations   Losartan Rash        Medication List        Accurate as of April 22, 2022 12:04 PM. If you have any questions, ask your nurse or doctor.          Asmanex (30 Metered Doses) 220 MCG/ACT inhaler Generic drug: mometasone INHALE 2 PUFFS INTO THE LUNGS TWICE A DAY   Asmanex (120 Metered Doses) 220 MCG/ACT inhaler Generic drug: mometasone Inhale 2 puffs into the lungs 2 (two) times daily.   azithromycin 500 MG tablet Commonly known as: ZITHROMAX Take by mouth.   azithromycin 250 MG tablet Commonly known as: ZITHROMAX Take by mouth.   budesonide 0.5 MG/2ML nebulizer solution Commonly known as: PULMICORT Take 0.5 mg by  nebulization as needed.   cefdinir 300 MG capsule Commonly known as: OMNICEF Take 1 capsule (300 mg total) by mouth 2 (two) times daily for 7 days. Started by: Verlee Monte, MD   cetirizine 10 MG tablet Commonly known as: ZYRTEC Take 10 mg by mouth daily.   Cholecalciferol 100 MCG (4000 UT) Caps Take 1 capsule (4,000 Units total) by mouth daily.   dexlansoprazole 60 MG capsule Commonly known as: Dexilant Take 1 capsule (60 mg total) by mouth daily.   EPINEPHrine 0.3 mg/0.3 mL Soaj injection Commonly known as: Auvi-Q Use as directed for severe allergic reactions   famotidine 20 MG tablet Commonly known as: PEPCID Take 1 tablet (20 mg total) by mouth 2 (two) times daily.   ferrous sulfate 325 (65 FE) MG tablet 3 (three) times a week.   fluconazole 150 MG tablet Commonly known as: Diflucan Take 1 tablet, then repeat in 3 days. Started by: Verlee Monte, MD   levalbuterol 1.25 MG/3ML nebulizer solution Commonly known as: XOPENEX USE 1 VIAL BY NEBULIZATION EVERY 6 (SIX) HOURS AS NEEDED FOR WHEEZING.   levalbuterol 45 MCG/ACT inhaler Commonly known as: XOPENEX HFA Inhale 2 puffs into the lungs every 6 (six) hours as needed.   metoprolol succinate 100 MG 24 hr tablet Commonly known as: TOPROL-XL TAKE 1 TABLET BY MOUTH DAILY. TAKE WITH OR IMMEDIATELY FOLLOWING A MEAL.   montelukast 10 MG  tablet Commonly known as: SINGULAIR Take 1 tablet (10 mg total) by mouth daily.   multivitamin-prenatal 27-0.8 MG Tabs tablet Take 1 tablet by mouth daily at 12 noon.   norethindrone 0.35 MG tablet Commonly known as: MICRONOR Take 0.35 mg by mouth.   ondansetron 4 MG tablet Commonly known as: Zofran Take 1 tablet (4 mg total) by mouth every 8 (eight) hours as needed for nausea or vomiting.   [facility] Caps   SUPER C COMPLEX PO   Xhance 93 MCG/ACT Exhu Generic drug: Fluticasone Propionate Place 93 mcg into the nose 2 (two) times daily. 2 sprays twice daily        Past Medical History:  Diagnosis Date   Allergic rhinitis    Allergy    Angio-edema    Asthma    Dysmenorrhea    Fatty liver    GERD (gastroesophageal reflux disease)    Hypertension    Recurrent upper respiratory infection (URI)    Sleep apnea    Tachycardia    Past Surgical History:  Procedure Laterality Date   NO PAST SURGERIES     Otherwise, there have been no changes to her past medical history, surgical history, family history, or social history.  ROS: All others negative except as noted per HPI.   Objective:  BP 134/84 (BP Location: Left Arm, Patient Position: Sitting, Cuff Size: Large)   Pulse 90   Temp 98.8 F (37.1 C) (Temporal)   Resp (!) 24   SpO2 99%  There is no height or weight on file to calculate BMI. Physical Exam: General Appearance:  Alert, cooperative, no distress, appears stated age  Head:  Normocephalic, without obvious abnormality, atraumatic  Eyes:  Conjunctiva clear, EOM's intact  Nose: Nares normal  Ears Normal right EAC and TM, left EAC normal, TM dull with fluid level  Throat: Lips, tongue normal; teeth and gums normal, normal posterior oropharynx  Neck: Supple, symmetrical  Lungs:   clear to auscultation bilaterally, Respirations unlabored, no coughing  Heart:  regular rate and rhythm and no murmur, Appears well perfused  Extremities: No edema  Skin: Skin color, texture, turgor normal, no rashes or lesions on visualized portions of skin   Spirometry:  Tracings reviewed. Her effort: Good reproducible efforts. FVC: 3.94L FEV1: 3.0L, 82% predicted FEV1/FVC ratio: 0.76 Interpretation: Spirometry consistent with normal pattern.  Please see scanned spirometry results for details.  Assessment/Plan   Left Serous Otitis Media:  - take cefdinir 300 mg twice daily for 7 days-hold azithromycin dose while on this medication - take with probiotics - take diflucan 150 mg once, then repeat dose 3 days later  Continue rest of allergy plan as  prior.   Follow up : 6 months, sooner if needed. It was a pleasure seeing you again in clinic today! Thank you for allowing me to participate in your care.  Tonny Bollman, MD  [facility]

## 2022-04-24 NOTE — Progress Notes (Unsigned)
TeleHealth Visit:  This visit was completed with telemedicine (audio/video) technology. Nicole Mendoza has verbally consented to this TeleHealth visit. The patient is located at home, the provider is located at home. The participants in this visit include the listed provider and patient. The visit was conducted today via MyChart video.  OBESITY Nicole Mendoza is here to discuss her progress with her obesity treatment plan along with follow-up of her obesity related diagnoses.   Today's visit was # 6 Starting weight: 301 lbs Starting date: 12/26/2021 Weight at last in office visit: 293 lbs on 02/28/22 Total weight loss: 8 lbs at last in office visit on 02/28/22. Today's reported weight: 290 lbs   Meal plan: keeping a food journal and adhering to recommended goals of 1750-1850 calories and 130+ grams of  protein daily.   Current exercise: none  Interim History: Nicole Mendoza has 2 foster children (5 years and 4 months).  She works a Architect.  Her husband is now gone most of the time in the evenings.  She says journaling for her is not realistic.  She says she is in "survival mode" in the evenings and really struggles with healthy dinners. She avoids lunch meat because of the sodium content.  Breakfast is usually 2 eggs and [place] bacon. She is maintaining weight but not losing any.  She has been eating out less because she is trying to save money.  She drinks only water.  She wants some ideas for easy dinners and a breakfast that we will keep her full longer.  Assessment/Plan:  1. Prediabetes Last A1c was 5.5. She struggles with polyphagia and sweets cravings.  This metformin briefly but she is concerned about GI side effects because she has IBS.  We discussed that she has several traits of PCOS such as hirsutism, insulin resistance, obesity, thinning hair.  He has never been diagnosed with this but is plans on talking to her gynecologist about it. Medication(s): none Lab Results  Component Value Date    HGBA1C 5.5 12/26/2021   Lab Results  Component Value Date   INSULIN 23.1 12/26/2021    Plan: She will consider starting metformin in the future.   2. Eating disorder/emotional eating Nicole Mendoza struggles with stress eating and sweets cravings.  She is not currently on any medication.  Plan: Discussed starting bupropion and she is going to research this.  Advised it could be helpful for cravings and overall mood.  3. Obesity: Current BMI 44.6 Nicole Mendoza is currently in the action stage of change. As such, her goal is to continue with weight loss efforts.  She has agreed to practicing portion control and making smarter food choices, such as increasing vegetables and decreasing simple carbohydrates.   Discussed several dinner ideas, lunch ideas, breakfast ideas, and sweet snacks with protein.  Exercise goals: No exercise has been prescribed at this time.  Behavioral modification strategies: increasing lean protein intake, decreasing simple carbohydrates, meal planning and cooking strategies, keeping healthy foods in the home, emotional eating strategies, and planning for success.  Nicole Mendoza has agreed to follow-up with our clinic in 3 weeks.   No orders of the defined types were placed in this encounter.   There are no discontinued medications.   No orders of the defined types were placed in this encounter.     Objective:   VITALS: Per patient if applicable, see vitals. GENERAL: Alert and in no acute distress. CARDIOPULMONARY: No increased WOB. Speaking in clear sentences.  PSYCH: Pleasant and cooperative. Speech normal rate and rhythm.  Affect is appropriate. Insight and judgement are appropriate. Attention is focused, linear, and appropriate.  NEURO: Oriented as arrived to appointment on time with no prompting.   Lab Results  Component Value Date   CREATININE 0.64 03/11/2022   BUN 17 03/11/2022   NA 138 03/11/2022   K 3.6 03/11/2022   CL 103 03/11/2022   CO2 27 03/11/2022    Lab Results  Component Value Date   ALT 22 03/11/2022   AST 16 03/11/2022   ALKPHOS 81 03/11/2022   BILITOT 0.5 03/11/2022   Lab Results  Component Value Date   HGBA1C 5.5 12/26/2021   HGBA1C 5.6 06/22/2021   Lab Results  Component Value Date   INSULIN 23.1 12/26/2021   Lab Results  Component Value Date   TSH 2.020 12/26/2021   Lab Results  Component Value Date   CHOL 158 12/26/2021   HDL 52 12/26/2021   LDLCALC 90 12/26/2021   TRIG 87 12/26/2021   Lab Results  Component Value Date   WBC 8.3 12/26/2021   HGB 13.6 12/26/2021   HCT 42.0 12/26/2021   MCV 86 12/26/2021   PLT 291 12/26/2021   No results found for: "IRON", "TIBC", "FERRITIN" Lab Results  Component Value Date   VD25OH 25.2 (L) 12/26/2021   VD25OH 41.0 09/01/2019    Attestation Statements:   Reviewed by clinician on day of visit: allergies, medications, problem list, medical history, surgical history, family history, social history, and previous encounter notes.   Time spent on visit including the items listed below was 35 minutes.  -preparing to see the patient (e.g., review of tests, history, previous notes) -obtaining and/or reviewing separately obtained history -counseling and educating the patient/family/caregiver -documenting clinical information in the electronic or other health record

## 2022-04-25 ENCOUNTER — Telehealth (INDEPENDENT_AMBULATORY_CARE_PROVIDER_SITE_OTHER): Payer: BC Managed Care – PPO | Admitting: Family Medicine

## 2022-04-25 ENCOUNTER — Encounter (INDEPENDENT_AMBULATORY_CARE_PROVIDER_SITE_OTHER): Payer: Self-pay | Admitting: Family Medicine

## 2022-04-25 DIAGNOSIS — Z6841 Body Mass Index (BMI) 40.0 and over, adult: Secondary | ICD-10-CM | POA: Diagnosis not present

## 2022-04-25 DIAGNOSIS — R7303 Prediabetes: Secondary | ICD-10-CM

## 2022-04-25 DIAGNOSIS — E669 Obesity, unspecified: Secondary | ICD-10-CM | POA: Diagnosis not present

## 2022-04-25 DIAGNOSIS — F5089 Other specified eating disorder: Secondary | ICD-10-CM | POA: Diagnosis not present

## 2022-04-26 DIAGNOSIS — Z630 Problems in relationship with spouse or partner: Secondary | ICD-10-CM | POA: Diagnosis not present

## 2022-04-26 DIAGNOSIS — F419 Anxiety disorder, unspecified: Secondary | ICD-10-CM | POA: Diagnosis not present

## 2022-04-29 DIAGNOSIS — F419 Anxiety disorder, unspecified: Secondary | ICD-10-CM | POA: Diagnosis not present

## 2022-05-16 ENCOUNTER — Telehealth (INDEPENDENT_AMBULATORY_CARE_PROVIDER_SITE_OTHER): Payer: BC Managed Care – PPO | Admitting: Family Medicine

## 2022-05-17 ENCOUNTER — Other Ambulatory Visit: Payer: Self-pay | Admitting: Allergy & Immunology

## 2022-05-17 DIAGNOSIS — Z630 Problems in relationship with spouse or partner: Secondary | ICD-10-CM | POA: Diagnosis not present

## 2022-05-17 DIAGNOSIS — F419 Anxiety disorder, unspecified: Secondary | ICD-10-CM | POA: Diagnosis not present

## 2022-05-20 ENCOUNTER — Other Ambulatory Visit: Payer: Self-pay | Admitting: Allergy & Immunology

## 2022-05-20 ENCOUNTER — Ambulatory Visit (INDEPENDENT_AMBULATORY_CARE_PROVIDER_SITE_OTHER): Payer: BC Managed Care – PPO

## 2022-05-20 ENCOUNTER — Other Ambulatory Visit: Payer: Self-pay

## 2022-05-20 ENCOUNTER — Encounter: Payer: Self-pay | Admitting: Internal Medicine

## 2022-05-20 VITALS — Wt 300.0 lb

## 2022-05-20 DIAGNOSIS — J3089 Other allergic rhinitis: Secondary | ICD-10-CM

## 2022-05-20 DIAGNOSIS — D806 Antibody deficiency with near-normal immunoglobulins or with hyperimmunoglobulinemia: Secondary | ICD-10-CM | POA: Diagnosis not present

## 2022-05-20 DIAGNOSIS — J4540 Moderate persistent asthma, uncomplicated: Secondary | ICD-10-CM | POA: Diagnosis not present

## 2022-05-20 DIAGNOSIS — J0181 Other acute recurrent sinusitis: Secondary | ICD-10-CM | POA: Diagnosis not present

## 2022-05-20 DIAGNOSIS — J302 Other seasonal allergic rhinitis: Secondary | ICD-10-CM

## 2022-05-20 DIAGNOSIS — K219 Gastro-esophageal reflux disease without esophagitis: Secondary | ICD-10-CM

## 2022-05-20 MED ORDER — LEVALBUTEROL HCL 1.25 MG/3ML IN NEBU: INHALATION_SOLUTION | RESPIRATORY_TRACT | 1 refills | Status: DC

## 2022-05-20 MED ORDER — ONDANSETRON HCL 4 MG PO TABS: 4.0000 mg | ORAL_TABLET | Freq: Three times a day (TID) | ORAL | 0 refills | Status: DC | PRN

## 2022-05-20 MED ORDER — PANTOPRAZOLE SODIUM 40 MG PO TBEC: 40.0000 mg | DELAYED_RELEASE_TABLET | Freq: Two times a day (BID) | ORAL | 0 refills | Status: DC

## 2022-05-20 MED ORDER — BUDESONIDE 0.5 MG/2ML IN SUSP: 0.5000 mg | RESPIRATORY_TRACT | 2 refills | Status: DC | PRN

## 2022-05-20 MED ORDER — LEVALBUTEROL TARTRATE 45 MCG/ACT IN AERO: 2.0000 | INHALATION_SPRAY | Freq: Four times a day (QID) | RESPIRATORY_TRACT | 3 refills | Status: DC | PRN

## 2022-05-20 NOTE — Progress Notes (Signed)
RE: Nicole Mendoza MRN: [id number] DOB: 08-26-1990 Date of Telemedicine Visit: 05/20/2022  Referring provider: [name], MD Primary care provider: [name], MD  Chief Complaint: Follow-up (Increased phlem x 4-5 days)   Telemedicine Follow Up Visit via Telephone: I connected with Nicole Mendoza for a follow up on 05/20/22 by telephone and verified that I am speaking with the correct person using two identifiers.   I discussed the limitations, risks, security and privacy concerns of performing an evaluation and management service by telephone and the availability of in person appointments. I also discussed with the patient that there may be a patient responsible charge related to this service. The patient expressed understanding and agreed to proceed.  Patient is at home accompanied by herself who provided/contributed to the history.  Provider is at the office.  Visit start time: 2;57 PM Visit end time: 3:18 PM Insurance consent/check in by: Augusto Garbe Medical consent and medical assistant/nurse: Brennan Bailey, CMA  History of Present Illness: She is a 31 y.o. female, who is being followed for persistent asthma, selective antibody deficiency, allergic rhinitis. Her previous allergy office visit was on 04/22/2022 with  Dr. [name] .   Today is an acute visit for congestion and mucus production. Symptoms started in her head as congestion and phlegm production, and she thought she was going to clear it over the weekend. She has so much mucus production that her throat has been sore. Her throat was hurting on Saturday significantly and it felt like a previous time when she had strep throat.  She had leftover cefdinir at her home was given for previous infection and did not need.  She did take 1 dose yesterday evening, and upon awakening this morning does feel slightly better.  She is unsure if she should continue this therapy. Her mucus is clear, but did have some tinge of blood this  morning. No fever, but did have diarrhea over the weekend. Her main concern is that her chest is starting to feel tight and she is concerned that her asthma is going to flare.  She does not like taking inhaled Pulmicort as it increases her reflux, and often she feels that she ends up needing nebulized treatment for weeks at a time. She is willing to start her asthma plan, but is worried because her Dexilant has not been approved and requires a PA.  She was switched from Protonix to Ol[place] because it was not as effective. She is due for an azithromycin dose today, but did not take it since she is on the cefdinir.  Assessment and Plan: Nicole Mendoza is a 31 y.o. female with:  Acute sinus infection:  - stop azithromycin - start cefdinir 300 mg twice daily for 5-7 days, take with probiotics (patient already taking) -Start budesonide 0.5 mg 1 vial twice daily for the next 2 weeks, and can use Xopenex prior to budesonide as well as every 6 hours scheduled while awake for the next 2 to 3 days -Continue Mucinex 600 to 1200 mg twice daily as needed for mucus production, drink with plenty of water -Once completed doxycycline course, can restart azithromycin prophylaxis - take Protonix 40 mg twice daily 30 minutes prior to meals until able to get PA for dexilant  Stay rested and drink lots of fluids Strongly consider immunoglobulin replacement  Continue rest of allergy and asthma medications as prescribed.  Follow-up for regular scheduled appointment in 6 months  Meds ordered this encounter  Medications   ondansetron (ZOFRAN)  4 MG tablet    Sig: Take 1 tablet (4 mg total) by mouth every 8 (eight) hours as needed for nausea or vomiting.    Dispense:  20 tablet    Refill:  0   pantoprazole (PROTONIX) 40 MG tablet    Sig: Take 1 tablet (40 mg total) by mouth 2 (two) times daily before a meal.    Dispense:  30 tablet    Refill:  0   levalbuterol (XOPENEX) 1.25 MG/3ML nebulizer solution    Sig: USE 1  VIAL BY NEBULIZATION EVERY 6 (SIX) HOURS AS NEEDED FOR WHEEZING.    Dispense:  90 mL    Refill:  1   levalbuterol (XOPENEX HFA) 45 MCG/ACT inhaler    Sig: Inhale 2 puffs into the lungs every 6 (six) hours as needed.    Dispense:  1 each    Refill:  3   Lab Orders  No laboratory test(s) ordered today    Diagnostics: None.  Medication List:  Current Outpatient Medications  Medication Sig Dispense Refill   budesonide (PULMICORT) 0.5 MG/2ML nebulizer solution Take 0.5 mg by nebulization as needed.     cetirizine (ZYRTEC) 10 MG tablet Take 10 mg by mouth daily.     Cholecalciferol 100 MCG (4000 UT) CAPS Take 1 capsule (4,000 Units total) by mouth daily. 30 capsule 0   dexlansoprazole (DEXILANT) 60 MG capsule TAKE 1 CAPSULE BY MOUTH EVERY DAY 90 capsule 1   EPINEPHrine (AUVI-Q) 0.3 mg/0.3 mL IJ SOAJ injection Use as directed for severe allergic reactions 4 each 1   famotidine (PEPCID) 20 MG tablet Take 1 tablet (20 mg total) by mouth 2 (two) times daily. 180 tablet 1   ferrous sulfate 325 (65 FE) MG tablet 3 (three) times a week.      Fluticasone Propionate (XHANCE) 93 MCG/ACT EXHU Place 93 mcg into the nose 2 (two) times daily. 2 sprays twice daily     metoprolol succinate (TOPROL-XL) 100 MG 24 hr tablet TAKE 1 TABLET BY MOUTH DAILY. TAKE WITH OR IMMEDIATELY FOLLOWING A MEAL. 90 tablet 1   mometasone (ASMANEX, 120 METERED DOSES,) 220 MCG/ACT inhaler Inhale 2 puffs into the lungs 2 (two) times daily. 1 each 5   montelukast (SINGULAIR) 10 MG tablet Take 1 tablet (10 mg total) by mouth daily. 90 tablet 1   norethindrone (MICRONOR) 0.35 MG tablet Take 0.35 mg by mouth.     pantoprazole (PROTONIX) 40 MG tablet Take 1 tablet (40 mg total) by mouth 2 (two) times daily before a meal. 30 tablet 0   Prenatal Vit-Fe Fumarate-FA (MULTIVITAMIN-PRENATAL) 27-0.8 MG TABS tablet Take 1 tablet by mouth daily at 12 noon.     Probiotic Product (PHILLIPS COLON HEALTH) CAPS      Ascorbic Acid (SUPER C COMPLEX  PO)      ASMANEX, 30 METERED DOSES, 220 MCG/ACT inhaler INHALE 2 PUFFS INTO THE LUNGS TWICE A DAY 5 each 0   azithromycin (ZITHROMAX) 250 MG tablet Take by mouth. (Patient not taking: Reported on 05/20/2022)     azithromycin (ZITHROMAX) 500 MG tablet Take by mouth. (Patient not taking: Reported on 05/20/2022)     fluconazole (DIFLUCAN) 150 MG tablet Take 1 tablet, then repeat in 3 days. (Patient not taking: Reported on 05/20/2022) 2 tablet 0   levalbuterol (XOPENEX HFA) 45 MCG/ACT inhaler Inhale 2 puffs into the lungs every 6 (six) hours as needed. 1 each 3   levalbuterol (XOPENEX) 1.25 MG/3ML nebulizer solution USE 1 VIAL BY NEBULIZATION  EVERY 6 (SIX) HOURS AS NEEDED FOR WHEEZING. 90 mL 1   ondansetron (ZOFRAN) 4 MG tablet Take 1 tablet (4 mg total) by mouth every 8 (eight) hours as needed for nausea or vomiting. 20 tablet 0   No current facility-administered medications for this visit.   Allergies: Allergies  Allergen Reactions   Budesonide-Formoterol Fumarate Other (See Comments)    Causes Hypertension   Other     Tree nuts   Penicillins Hives   Raspberry Hives   Spiriva Respimat [Tiotropium Bromide Monohydrate] Other (See Comments)    Pt states medication causes dizziness   Albuterol Palpitations   Losartan Rash   I reviewed her past medical history, social history, family history, and environmental history and no significant changes have been reported from previous visit.  Review of Systems-negative except as per HPI Objective: Physical Exam Not obtained as encounter was done via telephone.   Previous notes and tests were reviewed.  I discussed the assessment and treatment plan with the patient. The patient was provided an opportunity to ask questions and all were answered. The patient agreed with the plan and demonstrated an understanding of the instructions.   The patient was advised to call back or seek an in-person evaluation if the symptoms worsen or if the condition fails  to improve as anticipated.  I provided 12 minutes of non-face-to-face time during this encounter.  It was my pleasure to participate in [place] care today. Please feel free to contact me with any questions or concerns.   Sincerely,  Clemon Chambers, MD

## 2022-05-20 NOTE — Patient Instructions (Addendum)
Acute sinus infection:  - stop azithromycin - start cefdinir 300 mg twice daily for 5-7 days, take with probiotics (patient already taking) -Start budesonide 0.5 mg 1 vial twice daily for the next 2 weeks, and can use Xopenex prior to budesonide as well as every 6 hours scheduled while awake for the next 2 to 3 days -Continue Mucinex 600 to 1200 mg twice daily as needed for mucus production, drink with plenty of water -Once completed doxycycline course, can restart azithromycin prophylaxis - take Protonix 40 mg twice daily 30 minutes prior to meals until able to get PA for dexilant  Stay rested and drink lots of fluids Strongly consider immunoglobulin replacement  Continue rest of allergy and asthma medications as prescribed.  Follow-up for regular scheduled appointment in 6 months

## 2022-05-21 ENCOUNTER — Telehealth: Payer: Self-pay | Admitting: Allergy & Immunology

## 2022-05-21 ENCOUNTER — Other Ambulatory Visit: Payer: Self-pay

## 2022-05-21 MED ORDER — DOXYCYCLINE MONOHYDRATE 100 MG PO TABS: 100.0000 mg | ORAL_TABLET | Freq: Two times a day (BID) | ORAL | 0 refills | Status: AC

## 2022-05-21 MED ORDER — LEVALBUTEROL TARTRATE 45 MCG/ACT IN AERO: 2.0000 | INHALATION_SPRAY | Freq: Four times a day (QID) | RESPIRATORY_TRACT | 3 refills | Status: DC | PRN

## 2022-05-21 NOTE — Telephone Encounter (Signed)
Patient sent me an email asking for advice to deal with her infllamation (she does not tolerate prednisone and apparently her insurance doesn ot cover inhalers which is insane). She saw Dr. [name] yesterday and was prescribed cefdinir. I offered her a different antibiotic such as doxycycline that has stronger antiinflammatory effects. She was open to that, so I am sending that in. She can stop the cefdinir for now.   Salvatore Marvel, MD [facility] [place]

## 2022-05-22 ENCOUNTER — Telehealth: Payer: Self-pay

## 2022-05-22 ENCOUNTER — Encounter: Payer: Self-pay | Admitting: Internal Medicine

## 2022-05-22 ENCOUNTER — Other Ambulatory Visit (HOSPITAL_COMMUNITY): Payer: Self-pay

## 2022-05-22 NOTE — Telephone Encounter (Signed)
Atrovent nasal spray 1-2 sprays up to three times daily, prilosec (purchase over the counter until approved or we can see what will be covered) 40 mg BID 30 minutes before meals + pepcid 20 mg daily.  Continue budesonide.   Mucinex 1200 mg BID with lots of water for next week. And if needed, can do 40 mg IM depo shot in clinic.  Let me know if you decide to do this.

## 2022-05-22 NOTE — Telephone Encounter (Signed)
PA request received via CMM through [facility] for Pantoprazole Sodium '40MG'$  dr tablets  PA has been submitted and is awaiting determination.  Key: [id number]

## 2022-05-23 MED ORDER — METHYLPREDNISOLONE ACETATE 40 MG/ML IJ SUSP
40.0000 mg | Freq: Once | INTRAMUSCULAR | Status: AC
  Administered 2022-05-23: 40 mg via INTRAMUSCULAR

## 2022-05-23 NOTE — Telephone Encounter (Signed)
PA for Pantoprazole has been APPROVED from 05/22/2022-05/21/2023 through [facility].  Key: [id number]

## 2022-05-23 NOTE — Addendum Note (Signed)
Addended by: Felipa Emory on: 05/23/2022 01:21 PM   Modules accepted: Orders

## 2022-05-26 ENCOUNTER — Other Ambulatory Visit: Payer: Self-pay | Admitting: Allergy

## 2022-05-27 ENCOUNTER — Other Ambulatory Visit: Payer: Self-pay | Admitting: Allergy & Immunology

## 2022-05-27 ENCOUNTER — Telehealth: Payer: Self-pay

## 2022-05-27 ENCOUNTER — Encounter (HOSPITAL_BASED_OUTPATIENT_CLINIC_OR_DEPARTMENT_OTHER): Payer: Self-pay | Admitting: Emergency Medicine

## 2022-05-27 ENCOUNTER — Ambulatory Visit (INDEPENDENT_AMBULATORY_CARE_PROVIDER_SITE_OTHER): Payer: BC Managed Care – PPO | Admitting: Internal Medicine

## 2022-05-27 ENCOUNTER — Encounter: Payer: Self-pay | Admitting: Internal Medicine

## 2022-05-27 ENCOUNTER — Emergency Department (HOSPITAL_BASED_OUTPATIENT_CLINIC_OR_DEPARTMENT_OTHER)
Admission: EM | Admit: 2022-05-27 | Discharge: 2022-05-27 | Disposition: A | Discharge status: Home / Self Care | Payer: BC Managed Care – PPO | Attending: Emergency Medicine | Admitting: Emergency Medicine

## 2022-05-27 ENCOUNTER — Emergency Department (HOSPITAL_BASED_OUTPATIENT_CLINIC_OR_DEPARTMENT_OTHER): Payer: BC Managed Care – PPO

## 2022-05-27 VITALS — Temp 102.0°F | Wt 300.0 lb

## 2022-05-27 DIAGNOSIS — Z1152 Encounter for screening for COVID-19: Secondary | ICD-10-CM | POA: Insufficient documentation

## 2022-05-27 DIAGNOSIS — J4541 Moderate persistent asthma with (acute) exacerbation: Secondary | ICD-10-CM

## 2022-05-27 DIAGNOSIS — J302 Other seasonal allergic rhinitis: Secondary | ICD-10-CM

## 2022-05-27 DIAGNOSIS — J101 Influenza due to other identified influenza virus with other respiratory manifestations: Secondary | ICD-10-CM | POA: Insufficient documentation

## 2022-05-27 DIAGNOSIS — J45909 Unspecified asthma, uncomplicated: Secondary | ICD-10-CM | POA: Insufficient documentation

## 2022-05-27 DIAGNOSIS — J3089 Other allergic rhinitis: Secondary | ICD-10-CM

## 2022-05-27 DIAGNOSIS — R509 Fever, unspecified: Secondary | ICD-10-CM | POA: Diagnosis not present

## 2022-05-27 DIAGNOSIS — J4540 Moderate persistent asthma, uncomplicated: Secondary | ICD-10-CM

## 2022-05-27 DIAGNOSIS — K219 Gastro-esophageal reflux disease without esophagitis: Secondary | ICD-10-CM | POA: Diagnosis not present

## 2022-05-27 DIAGNOSIS — R000 Tachycardia, unspecified: Secondary | ICD-10-CM | POA: Diagnosis not present

## 2022-05-27 DIAGNOSIS — J989 Respiratory disorder, unspecified: Secondary | ICD-10-CM | POA: Diagnosis not present

## 2022-05-27 DIAGNOSIS — D806 Antibody deficiency with near-normal immunoglobulins or with hyperimmunoglobulinemia: Secondary | ICD-10-CM

## 2022-05-27 DIAGNOSIS — R059 Cough, unspecified: Secondary | ICD-10-CM | POA: Diagnosis not present

## 2022-05-27 LAB — RESP PANEL BY RT-PCR (FLU A&B, COVID) ARPGX2
Influenza A by PCR: POSITIVE — AB
Influenza B by PCR: NEGATIVE
SARS Coronavirus 2 by RT PCR: NEGATIVE

## 2022-05-27 MED ORDER — PREDNISONE 10 MG PO TABS: ORAL_TABLET | ORAL | 0 refills | Status: DC

## 2022-05-27 MED ORDER — ACETAMINOPHEN 500 MG PO TABS
1000.0000 mg | ORAL_TABLET | Freq: Once | ORAL | Status: DC
  Filled 2022-05-27: qty 2

## 2022-05-27 MED ORDER — PREDNISONE 10 MG PO TABS: 10.0000 mg | ORAL_TABLET | Freq: Two times a day (BID) | ORAL | 0 refills | Status: DC

## 2022-05-27 MED ORDER — DEXAMETHASONE SODIUM PHOSPHATE 10 MG/ML IJ SOLN: 10.0000 mg | Freq: Once | INTRAMUSCULAR | Status: DC

## 2022-05-27 MED ORDER — IPRATROPIUM BROMIDE 0.02 % IN SOLN: 0.2500 mg | Freq: Four times a day (QID) | RESPIRATORY_TRACT | 3 refills | Status: AC

## 2022-05-27 NOTE — Telephone Encounter (Signed)
Transition Care Management Follow-up Telephone Call Date of discharge and from where: 05/27/22 [facility] Dx: Flu A How have you been since you were released from the hospital? I'm doing ok. Any questions or concerns? No  Items Reviewed: Did the pt receive and understand the discharge instructions provided? Yes  Medications obtained and verified? Yes  Other? No  Any new allergies since your discharge? No  Dietary orders reviewed? No Do you have support at home? Yes   Home Care and Equipment/Supplies: Were home health services ordered? not applicable If so, what is the name of the agency? N/A  Has the agency set up a time to come to the patient's home? not applicable Were any new equipment or medical supplies ordered?  No What is the name of the medical supply agency? N/A Were you able to get the supplies/equipment? not applicable Do you have any questions related to the use of the equipment or supplies? No  Functional Questionnaire: (I = Independent and D = Dependent) ADLs: I  Bathing/Dressing- I  Meal Prep- I  Eating- I  Maintaining continence- I  Transferring/Ambulation- I  Managing Meds- I  Follow up appointments reviewed:  PCP Hospital f/u appt confirmed? Yes  Scheduled to see Dr. Gena Fray on 06/13/22 @ [facility] f/u appt confirmed? No  Scheduled to see n/a on n/a @ n/a. Are transportation arrangements needed? No  If their condition worsens, is the pt aware to call PCP or go to the Emergency Dept.? Yes Was the patient provided with contact information for the PCP's office or ED? Yes Was to pt encouraged to call back with questions or concerns? Yes   Angeline Slim, RN, BSN RN Clinical Supervisor [facility] [employer]

## 2022-05-27 NOTE — Progress Notes (Signed)
Thank you! Looks like she is seeing Fara Olden soon for follow-up.

## 2022-05-27 NOTE — ED Triage Notes (Signed)
Patient presents C/O cough, fever, congestion, SOB. Son tested positive for Flu A

## 2022-05-27 NOTE — ED Provider Notes (Signed)
Emergency Department Provider Note   I have reviewed the triage vital signs and the nursing notes.   HISTORY  Chief Complaint Fever and Cough   HPI Saide Lanuza is a 31 y.o. female presents to the emergency department for evaluation of fever, cough, congestion starting today.  She notes that her son has flu a and she developed symptoms shortly after.  No vomiting or diarrhea.  She has a history of asthma and has been using her MDI more than normal at home. No CP.   Past Medical History:  Diagnosis Date   Allergic rhinitis    Allergy    Angio-edema    Asthma    Dysmenorrhea    Fatty liver    GERD (gastroesophageal reflux disease)    Hypertension    Recurrent upper respiratory infection (URI)    Sleep apnea    Tachycardia     Review of Systems  Constitutional: Positive fever/chills Eyes: No visual changes. ENT: No sore throat. Positive congestion.  Cardiovascular: Denies chest pain. Respiratory: Positive shortness of breath and cough.  Gastrointestinal: No abdominal pain.  No nausea, no vomiting.  No diarrhea.  No constipation. Genitourinary: Negative for dysuria. Musculoskeletal: Positive back pain.  Skin: Negative for rash. Neurological: Negative for focal weakness or numbness. Positive HA.    ____________________________________________   PHYSICAL EXAM:  VITAL SIGNS: ED Triage Vitals  Enc Vitals Group     BP 05/27/22 1111 (!) 164/84     Pulse Rate 05/27/22 1111 (!) 141     Resp 05/27/22 1111 18     Temp 05/27/22 1111 99.9 F (37.7 C)     Temp Source 05/27/22 1111 Oral     SpO2 05/27/22 1111 97 %     Weight 05/27/22 1109 300 lb (136.1 kg)     Height 05/27/22 1109 '5\' 8"'$  (1.727 m)   Constitutional: Alert and oriented. Well appearing and in no acute distress. Eyes: Conjunctivae are normal.  Head: Atraumatic. Nose: Positive congestion/rhinnorhea. Mouth/Throat: Mucous membranes are moist.  Oropharynx non-erythematous. Neck: No stridor.    Cardiovascular: Tachycardia. Good peripheral circulation. Grossly normal heart sounds.   Respiratory: Normal respiratory effort.  No retractions. Lungs with faint end-expiratory wheezing but overall good air movement.  Gastrointestinal: Soft and nontender. No distention.  Musculoskeletal: No lower extremity tenderness nor edema. No gross deformities of extremities. Neurologic:  Normal speech and language. No gross focal neurologic deficits are appreciated.  Skin:  Skin is warm, dry and intact. No rash noted.  ____________________________________________   LABS (all labs ordered are listed, but only abnormal results are displayed)  Labs Reviewed  RESP PANEL BY RT-PCR (FLU A&B, COVID) ARPGX2 - Abnormal; Notable for the following components:      Result Value   Influenza A by PCR POSITIVE (*)    All other components within normal limits    ____________________________________________  RADIOLOGY  No results found.  ____________________________________________   PROCEDURES  Procedure(s) performed:   Procedures  None  ____________________________________________   INITIAL IMPRESSION / ASSESSMENT AND PLAN / ED COURSE  Pertinent labs & imaging results that were available during my care of the patient were reviewed by me and considered in my medical decision making (see chart for details).   This patient is Presenting for Evaluation of SOB, which does require a range of treatment options, and is a complaint that involves a high risk of morbidity and mortality.  The Differential Diagnoses include asthma, Flu, RSV, CAP, COVID, etc.  Critical Interventions-  Medications - No data to display   Reassessment after intervention: Symptoms improved.     Clinical Laboratory Tests Ordered, included Flu A positive.   Radiologic Tests Ordered, included CXR. I independently interpreted the images and agree with radiology interpretation.   Cardiac Monitor Tracing which shows  NSR.    Social Determinants of Health Risk patient is a non-smoker.    Medical Decision Making: Summary:  Patient presents emergency department with shortness of breath and flulike symptoms.  She tested positive for flu A.  She is at increased risk of worsening symptoms with her underlying history of asthma.  She does not appear to be in acute distress.  I not hear significant wheezing or restriction auscultation of her lungs.  She has nebulizer/MDI at home and these seem to be working well.  We discussed Decadron dosing here in the ED but she would prefer to discuss with her allergist as she has certain sensitivities to medications and prefers particular dosing. Will defer to her outpatient provider.  Also discussed antiviral medication but given duration of symptoms and after shared decision making discussion, plan to defer.   Considered admission but no hypoxemia, respiratory distress, other indication for admission.  Plan for outpatient management with strict ED return precaution.  Patient's presentation is most consistent with acute illness / injury with system symptoms.   Disposition: discharge  ____________________________________________  FINAL CLINICAL IMPRESSION(S) / ED DIAGNOSES  Final diagnoses:  Influenza A      Note:  This document was prepared using Dragon voice recognition software and may include unintentional dictation errors.  Nanda Quinton, MD, [facility] Emergency Medicine    Maleeah Crossman, [facility], MD 05/29/22 [phone]

## 2022-05-27 NOTE — Progress Notes (Signed)
RE: Nicole Mendoza MRN: [id number] DOB: 27-Oct-1990 Date of Telemedicine Visit: 05/27/2022  Referring provider: [name], MD Primary care provider: [name], MD  Chief Complaint: Follow-up and Cough (Son dx flu A chest pain 3 with cough increased to 6)   Telemedicine Follow Up Visit via Telephone: I connected with Rhianne Soman for a follow up on 05/27/22 by telephone and verified that I am speaking with the correct person using two identifiers.   I discussed the limitations, risks, security and privacy concerns of performing an evaluation and management service by telephone and the availability of in person appointments. I also discussed with the patient that there may be a patient responsible charge related to this service. The patient expressed understanding and agreed to proceed.  Patient is at home accompanied by her husband who did not provide/contribute to the history.  Provider is at the office.  Visit start time: 08:45 AM Visit end time: 08:53 AM Insurance consent/check in by: Augusto Garbe Medical consent and medical assistant/nurse: Brennan Bailey  History of Present Illness: She is a 31 y.o. female, who is being followed for persistent asthma, selective antibody deficiency, allergic rhinitis. Her previous allergy office visit was on 05/20/22 with  Dr. [name] .   Saturday she was 80% better.  However, her husband started getting sick.  Her husband was diagnosed with flu A.   Sunday she started to feel like "a train wreck". She starts wheezing after taking her nebulizer. She fees so tight that once she takes albuterol, she is able to move air and this is causing her to wheeze.   Her vocal cords feel irritated due to coughing.  Her HR is 108. Fever of 102 F and severe body aches. She is still on doxycycline prescribed last week to prior suspected URI.  She was given 40 mg IM depomedrol last week in clinic.    She is willing to start hizentra and has asked that we get  this set-up for her. She has let her primary allergist know her willingness to get this going.  She did take a home covid testing that was negative.   Assessment and Plan: Hubert is a 31 y.o. female with: Respiratory infection with acute asthma exacerbation  -we have advised her to go to the nearest urgent care to have multiple swabs including flu, COVID -Consider chest imaging -Consider additional steroid injections for asthma exacerbation, patient does not tolerate oral prednisone  -She will continue her plan from last visit -We are providing her with ipratropium nebulizer for her to mix with levalbuterol at home and use every 6 hours as needed  -Will update her primary allergist regarding her decision to start Hizentra  Lab Orders  No laboratory test(s) ordered today    Diagnostics: None.  Medication List:  Current Outpatient Medications  Medication Sig Dispense Refill   Ascorbic Acid (SUPER C COMPLEX PO)      ASMANEX, 30 METERED DOSES, 220 MCG/ACT inhaler INHALE 2 PUFFS INTO THE LUNGS TWICE A DAY 5 each 0   budesonide (PULMICORT) 0.5 MG/2ML nebulizer solution Take 2 mLs (0.5 mg total) by nebulization as needed. 60 mL 2   cetirizine (ZYRTEC) 10 MG tablet Take 10 mg by mouth daily.     Cholecalciferol 100 MCG (4000 UT) CAPS Take 1 capsule (4,000 Units total) by mouth daily. 30 capsule 0   dexlansoprazole (DEXILANT) 60 MG capsule TAKE 1 CAPSULE BY MOUTH EVERY DAY 90 capsule 1   doxycycline (ADOXA) 100 MG  tablet Take 1 tablet (100 mg total) by mouth 2 (two) times daily for 10 days. 20 tablet 0   EPINEPHrine (AUVI-Q) 0.3 mg/0.3 mL IJ SOAJ injection Use as directed for severe allergic reactions 4 each 1   famotidine (PEPCID) 20 MG tablet Take 1 tablet (20 mg total) by mouth 2 (two) times daily. 180 tablet 1   ferrous sulfate 325 (65 FE) MG tablet 3 (three) times a week.      Fluticasone Propionate (XHANCE) 93 MCG/ACT EXHU Place 93 mcg into the nose 2 (two) times daily. 2 sprays twice  daily     levalbuterol (XOPENEX HFA) 45 MCG/ACT inhaler Inhale 2 puffs into the lungs every 6 (six) hours as needed. 1 each 3   levalbuterol (XOPENEX) 1.25 MG/3ML nebulizer solution USE 1 VIAL BY NEBULIZATION EVERY 6 (SIX) HOURS AS NEEDED FOR WHEEZING. 90 mL 1   metoprolol succinate (TOPROL-XL) 100 MG 24 hr tablet TAKE 1 TABLET BY MOUTH DAILY. TAKE WITH OR IMMEDIATELY FOLLOWING A MEAL. 90 tablet 1   mometasone (ASMANEX, 120 METERED DOSES,) 220 MCG/ACT inhaler Inhale 2 puffs into the lungs 2 (two) times daily. 1 each 5   montelukast (SINGULAIR) 10 MG tablet Take 1 tablet (10 mg total) by mouth daily. 90 tablet 1   norethindrone (MICRONOR) 0.35 MG tablet Take 0.35 mg by mouth.     ondansetron (ZOFRAN) 4 MG tablet Take 1 tablet (4 mg total) by mouth every 8 (eight) hours as needed for nausea or vomiting. 20 tablet 0   pantoprazole (PROTONIX) 40 MG tablet Take 1 tablet (40 mg total) by mouth 2 (two) times daily before a meal. 30 tablet 0   Prenatal Vit-Fe Fumarate-FA (MULTIVITAMIN-PRENATAL) 27-0.8 MG TABS tablet Take 1 tablet by mouth daily at 12 noon.     Probiotic Product (PHILLIPS COLON HEALTH) CAPS      azithromycin (ZITHROMAX) 250 MG tablet Take by mouth. (Patient not taking: Reported on 05/20/2022)     azithromycin (ZITHROMAX) 500 MG tablet Take by mouth. (Patient not taking: Reported on 05/20/2022)     fluconazole (DIFLUCAN) 150 MG tablet Take 1 tablet, then repeat in 3 days. (Patient not taking: Reported on 05/20/2022) 2 tablet 0   No current facility-administered medications for this visit.   Allergies: Allergies  Allergen Reactions   Budesonide-Formoterol Fumarate Other (See Comments)    Causes Hypertension   Other     Tree nuts   Penicillins Hives   Raspberry Hives   Spiriva Respimat [Tiotropium Bromide Monohydrate] Other (See Comments)    Pt states medication causes dizziness   Albuterol Palpitations   Losartan Rash   I reviewed her past medical history, social history, family  history, and environmental history and no significant changes have been reported from previous visit.  Review of Systems-negative except as per HPI Objective: Physical Exam Not obtained as encounter was done via telephone.   Previous notes and tests were reviewed.  I discussed the assessment and treatment plan with the patient. The patient was provided an opportunity to ask questions and all were answered. The patient agreed with the plan and demonstrated an understanding of the instructions.   The patient was advised to call back or seek an in-person evaluation if the symptoms worsen or if the condition fails to improve as anticipated.  I provided 8 minutes of non-face-to-face time during this encounter.  It was my pleasure to participate in [facility] care today. Please feel free to contact me with any questions or concerns.  Sincerely,  Clemon Chambers, MD

## 2022-05-27 NOTE — Discharge Instructions (Addendum)
You were seen in the emergency department today with flu symptoms and diagnosed with flu A. You make continue over-the-counter medicines and your inhalers at home.  If you develop chest discomfort or shortness of breath that is severe and not responding to medicines you should return for reevaluation.

## 2022-05-28 NOTE — Progress Notes (Signed)
She has flu A and is feeling better now. She is strongly considering Hizentra which I think will help her. She has some in her fridge from months ago when she was going to start.   Thanks for taking care of her! Love the TEAM APPROACH!   Salvatore Marvel, MD [facility]

## 2022-05-29 ENCOUNTER — Telehealth: Payer: Self-pay

## 2022-05-29 ENCOUNTER — Other Ambulatory Visit: Payer: Self-pay | Admitting: Internal Medicine

## 2022-05-29 NOTE — Telephone Encounter (Signed)
Please see pended note for pts work

## 2022-05-29 NOTE — Progress Notes (Deleted)
FOLLOW UP Date of Service/Encounter:  05/29/22   Subjective:  Nicole Mendoza (DOB: [birth year]) is a 31 y.o. female who [facility] in re-evaluation of the following:  persistent asthma, selective antibody deficiency, allergic rhinitis  History obtained from: chart review and {Persons; PED relatives w/patient:19415::"patient"}.  For Review, LV was on 05/27/22  with Dr.Lidwina Kaner seen for  telehealth visit for flu a and asthma exacerbation, prednisone baby pack sent in to pharmacy. Patient had CXR at [facility] which did not show localized pneumonia .  Today presents for follow-up. ***  Allergies as of 05/30/2022       Reactions   Budesonide-formoterol Fumarate Other (See Comments)   Causes Hypertension   Other    Tree nuts   Penicillins Hives   Raspberry Hives   Spiriva Respimat [tiotropium Bromide Monohydrate] Other (See Comments)   Pt states medication causes dizziness   Albuterol Palpitations   Losartan Rash        Medication List        Accurate as of May 29, 2022  5:29 PM. If you have any questions, ask your nurse or doctor.          Asmanex (30 Metered Doses) 220 MCG/ACT inhaler Generic drug: mometasone INHALE 2 PUFFS INTO THE LUNGS TWICE A DAY   Asmanex (120 Metered Doses) 220 MCG/ACT inhaler Generic drug: mometasone Inhale 2 puffs into the lungs 2 (two) times daily.   azithromycin 500 MG tablet Commonly known as: ZITHROMAX Take by mouth.   azithromycin 250 MG tablet Commonly known as: ZITHROMAX Take by mouth.   budesonide 0.5 MG/2ML nebulizer solution Commonly known as: PULMICORT Take 2 mLs (0.5 mg total) by nebulization as needed.   cetirizine 10 MG tablet Commonly known as: ZYRTEC Take 10 mg by mouth daily.   Cholecalciferol 100 MCG (4000 UT) Caps Take 1 capsule (4,000 Units total) by mouth daily.   dexlansoprazole 60 MG capsule Commonly known as: DEXILANT TAKE 1 CAPSULE BY MOUTH EVERY DAY    doxycycline 100 MG tablet Commonly known as: ADOXA Take 1 tablet (100 mg total) by mouth 2 (two) times daily for 10 days.   EPINEPHrine 0.3 mg/0.3 mL Soaj injection Commonly known as: Auvi-Q Use as directed for severe allergic reactions   famotidine 20 MG tablet Commonly known as: PEPCID TAKE 1 TABLET BY MOUTH TWICE A DAY   ferrous sulfate 325 (65 FE) MG tablet 3 (three) times a week.   fluconazole 150 MG tablet Commonly known as: Diflucan Take 1 tablet, then repeat in 3 days.   ipratropium 0.02 % nebulizer solution Commonly known as: ATROVENT Take 1.25 mLs (0.25 mg total) by nebulization 4 (four) times daily. Mix 0.5 vial in with levalbuterol nebulizer and can use every 6 hours as needed for shortness of breath and wheezing.   levalbuterol 1.25 MG/3ML nebulizer solution Commonly known as: XOPENEX USE 1 VIAL BY NEBULIZATION EVERY 6 (SIX) HOURS AS NEEDED FOR WHEEZING.   levalbuterol 45 MCG/ACT inhaler Commonly known as: XOPENEX HFA Inhale 2 puffs into the lungs every 6 (six) hours as needed.   metoprolol succinate 100 MG 24 hr tablet Commonly known as: TOPROL-XL TAKE 1 TABLET BY MOUTH DAILY. TAKE WITH OR IMMEDIATELY FOLLOWING A MEAL.   montelukast 10 MG tablet Commonly known as: SINGULAIR TAKE 1 TABLET BY MOUTH EVERY DAY   multivitamin-prenatal 27-0.8 MG Tabs tablet Take 1 tablet by mouth daily at 12 noon.   norethindrone 0.35 MG tablet Commonly known  as: MICRONOR Take 0.35 mg by mouth.   ondansetron 4 MG tablet Commonly known as: Zofran Take 1 tablet (4 mg total) by mouth every 8 (eight) hours as needed for nausea or vomiting.   pantoprazole 40 MG tablet Commonly known as: Protonix Take 1 tablet (40 mg total) by mouth 2 (two) times daily before a meal.   Vear Clock Colon Health Caps   predniSONE 10 MG tablet Commonly known as: DELTASONE 1 tablet twice daily for 4 days then 10 mg once daily on day 5 then stop   predniSONE 10 MG tablet Commonly known as:  DELTASONE Take 1 tablet (10 mg total) by mouth 2 (two) times daily with a meal. Take 1 tablet twice daily by mouth for 4 days then 1 tablet on day 5.   SUPER C COMPLEX PO   Xhance 93 MCG/ACT Exhu Generic drug: Fluticasone Propionate Place 93 mcg into the nose 2 (two) times daily. 2 sprays twice daily       Past Medical History:  Diagnosis Date   Allergic rhinitis    Allergy    Angio-edema    Asthma    Dysmenorrhea    Fatty liver    GERD (gastroesophageal reflux disease)    Hypertension    Recurrent upper respiratory infection (URI)    Sleep apnea    Tachycardia    Past Surgical History:  Procedure Laterality Date   NO PAST SURGERIES     Otherwise, there have been no changes to her past medical history, surgical history, family history, or social history.  ROS: All others negative except as noted per HPI.   Objective:  There were no vitals taken for this visit. There is no height or weight on file to calculate BMI. Physical Exam: General Appearance:  Alert, cooperative, no distress, appears stated age  Head:  Normocephalic, without obvious abnormality, atraumatic  Eyes:  Conjunctiva clear, EOM's intact  Nose: Nares normal, {Blank multiple:19196:a:"***","hypertrophic turbinates","normal mucosa","no visible anterior polyps","septum midline"}  Throat: Lips, tongue normal; teeth and gums normal, {Blank multiple:19196:a:"***","normal posterior oropharynx","tonsils 2+","tonsils 3+","no tonsillar exudate","+ cobblestoning"}  Neck: Supple, symmetrical  Lungs:   {Blank multiple:19196:a:"***","clear to auscultation bilaterally","end-expiratory wheezing","wheezing throughout"}, Respirations unlabored, {Blank multiple:19196:a:"***","no coughing","intermittent dry coughing"}  Heart:  {Blank multiple:19196:a:"***","regular rate and rhythm","no murmur"}, Appears well perfused  Extremities: No edema  Skin: Skin color, texture, turgor normal, no rashes or lesions on visualized portions  of skin  Neurologic: No gross deficits   Reviewed: ***  Spirometry:  Tracings reviewed. Her effort: {Blank single:19197::"Good reproducible efforts.","It was hard to get consistent efforts and there is a question as to whether this reflects a maximal maneuver.","Poor effort, data can not be interpreted.","Variable effort-results affected.","decent for first attempt at spirometry."} FVC: ***L FEV1: ***L, ***% predicted FEV1/FVC ratio: ***% Interpretation: {Blank single:19197::"Spirometry consistent with mild obstructive disease","Spirometry consistent with moderate obstructive disease","Spirometry consistent with severe obstructive disease","Spirometry consistent with possible restrictive disease","Spirometry consistent with mixed obstructive and restrictive disease","Spirometry uninterpretable due to technique","Spirometry consistent with normal pattern","No overt abnormalities noted given today's efforts"}.  Please see scanned spirometry results for details.  Skin Testing: {Blank single:19197::"Select foods","Environmental allergy panel","Environmental allergy panel and select foods","Food allergy panel","None","Deferred due to recent antihistamines use","deferred due to recent reaction"}. ***Adequate positive and negative controls Results discussed with patient/family.   {Blank single:19197::"Allergy testing results were read and interpreted by myself, documented by clinical staff."," "}  Assessment/Plan   ***  Tonny Bollman, MD  [facility]

## 2022-05-29 NOTE — Telephone Encounter (Signed)
Oops-I meant to send this to you on Monday.  Looks good to me. How is DJ?

## 2022-05-30 ENCOUNTER — Encounter: Payer: Self-pay | Admitting: Internal Medicine

## 2022-05-30 ENCOUNTER — Ambulatory Visit: Payer: 59 | Admitting: Internal Medicine

## 2022-05-30 ENCOUNTER — Ambulatory Visit (INDEPENDENT_AMBULATORY_CARE_PROVIDER_SITE_OTHER): Payer: BC Managed Care – PPO | Admitting: Internal Medicine

## 2022-05-30 VITALS — BP 150/80 | HR 109 | Temp 97.9°F | Resp 18

## 2022-05-30 DIAGNOSIS — J4541 Moderate persistent asthma with (acute) exacerbation: Secondary | ICD-10-CM

## 2022-05-30 DIAGNOSIS — K219 Gastro-esophageal reflux disease without esophagitis: Secondary | ICD-10-CM

## 2022-05-30 DIAGNOSIS — J101 Influenza due to other identified influenza virus with other respiratory manifestations: Secondary | ICD-10-CM | POA: Diagnosis not present

## 2022-05-30 DIAGNOSIS — J3089 Other allergic rhinitis: Secondary | ICD-10-CM

## 2022-05-30 DIAGNOSIS — J4540 Moderate persistent asthma, uncomplicated: Secondary | ICD-10-CM | POA: Diagnosis not present

## 2022-05-30 DIAGNOSIS — Z630 Problems in relationship with spouse or partner: Secondary | ICD-10-CM | POA: Diagnosis not present

## 2022-05-30 DIAGNOSIS — J302 Other seasonal allergic rhinitis: Secondary | ICD-10-CM

## 2022-05-30 DIAGNOSIS — D806 Antibody deficiency with near-normal immunoglobulins or with hyperimmunoglobulinemia: Secondary | ICD-10-CM | POA: Diagnosis not present

## 2022-05-30 DIAGNOSIS — F419 Anxiety disorder, unspecified: Secondary | ICD-10-CM | POA: Diagnosis not present

## 2022-05-30 MED ORDER — METHYLPREDNISOLONE ACETATE 80 MG/ML IJ SUSP
80.0000 mg | Freq: Once | INTRAMUSCULAR | Status: AC
  Administered 2022-05-30: 80 mg via INTRAMUSCULAR

## 2022-05-30 MED ORDER — HYDROXYZINE HCL 25 MG PO TABS: 25.0000 mg | ORAL_TABLET | Freq: Four times a day (QID) | ORAL | 1 refills | Status: AC | PRN

## 2022-05-30 MED ORDER — PREDNISONE 10 MG PO TABS: ORAL_TABLET | ORAL | 0 refills | Status: DC

## 2022-05-30 NOTE — Progress Notes (Signed)
FOLLOW UP Date of Service/Encounter:  05/30/22   Subjective:  Nicole Mendoza (DOB: 03-Feb-1991) is a 31 y.o. female who [facility]22 in re-evaluation of the following: persistent asthma, selective antibody deficiency, allergic rhinitis  History obtained from: chart review and patient.   For Review, LV was on 05/27/22  with Dr.Werner Labella seen for  telehealth visit for flu a and asthma exacerbation, prednisone baby pack sent in to pharmacy. Patient had CXR at [facility] which did not show localized pneumonia . She reported being ready to start Hizentra for SAD, and this was communicated to our biologics and specialized medicines coordinator. She does have the medication at home, but needs home health in place as this has been cancelled on previous attempts.   Today presents for follow-up. She is following up today because she is concerned about her asthma and feels she is wheezing when she does her breathing treatments. Her son also has flu and she has been up all night with him and his asthma is flaring.  She has a significant fear of being hospitalized.  She does not want to be away from her family.   She feels her breathing is worse when she uses the budesonide treatments. She is taking her prednisone 10 mg twice daily, but forgot her dose last night. She also did not take her night time duoneb therapy or this mornings (using 1 vial albuterol mixed with 1/2 vial of atrovent). She is trying to use every 6 hours scheduled. She feels short of breath from activities such as taking a shower. She is tearful during our encounter.   Allergies as of 05/30/2022       Reactions   Budesonide-formoterol Fumarate Other (See Comments)   Causes Hypertension   Other    Tree nuts   Penicillins Hives   Raspberry Hives   Spiriva Respimat [tiotropium Bromide Monohydrate] Other (See Comments)   Pt states medication causes dizziness   Albuterol Palpitations   Losartan Rash         Medication List        Accurate as of May 30, 2022  4:41 PM. If you have any questions, ask your nurse or doctor.          STOP taking these medications    azithromycin 250 MG tablet Commonly known as: ZITHROMAX Stopped by: Verlee Monte, MD   azithromycin 500 MG tablet Commonly known as: ZITHROMAX Stopped by: Verlee Monte, MD   fluconazole 150 MG tablet Commonly known as: Diflucan Stopped by: Verlee Monte, MD       TAKE these medications    Asmanex (30 Metered Doses) 220 MCG/ACT inhaler Generic drug: mometasone INHALE 2 PUFFS INTO THE LUNGS TWICE A DAY   Asmanex (120 Metered Doses) 220 MCG/ACT inhaler Generic drug: mometasone Inhale 2 puffs into the lungs 2 (two) times daily.   budesonide 0.5 MG/2ML nebulizer solution Commonly known as: PULMICORT Take 2 mLs (0.5 mg total) by nebulization as needed.   cetirizine 10 MG tablet Commonly known as: ZYRTEC Take 10 mg by mouth daily.   Cholecalciferol 100 MCG (4000 UT) Caps Take 1 capsule (4,000 Units total) by mouth daily.   dexlansoprazole 60 MG capsule Commonly known as: DEXILANT TAKE 1 CAPSULE BY MOUTH EVERY DAY   doxycycline 100 MG tablet Commonly known as: ADOXA Take 1 tablet (100 mg total) by mouth 2 (two) times daily for 10 days.   EPINEPHrine 0.3 mg/0.3 mL Soaj injection  Commonly known as: Auvi-Q Use as directed for severe allergic reactions   famotidine 20 MG tablet Commonly known as: PEPCID TAKE 1 TABLET BY MOUTH TWICE A DAY   ferrous sulfate 325 (65 FE) MG tablet 3 (three) times a week.   hydrOXYzine 25 MG tablet Commonly known as: ATARAX Take 1 tablet (25 mg total) by mouth every 6 (six) hours as needed for anxiety (allergies). Started by: Verlee Monte, MD   ipratropium 0.02 % nebulizer solution Commonly known as: ATROVENT Take 1.25 mLs (0.25 mg total) by nebulization 4 (four) times daily. Mix 0.5 vial in with levalbuterol nebulizer and can use every 6 hours as needed for  shortness of breath and wheezing.   levalbuterol 1.25 MG/3ML nebulizer solution Commonly known as: XOPENEX USE 1 VIAL BY NEBULIZATION EVERY 6 (SIX) HOURS AS NEEDED FOR WHEEZING.   levalbuterol 45 MCG/ACT inhaler Commonly known as: XOPENEX HFA Inhale 2 puffs into the lungs every 6 (six) hours as needed.   metoprolol succinate 100 MG 24 hr tablet Commonly known as: TOPROL-XL TAKE 1 TABLET BY MOUTH DAILY. TAKE WITH OR IMMEDIATELY FOLLOWING A MEAL.   montelukast 10 MG tablet Commonly known as: SINGULAIR TAKE 1 TABLET BY MOUTH EVERY DAY   multivitamin-prenatal 27-0.8 MG Tabs tablet Take 1 tablet by mouth daily at 12 noon.   norethindrone 0.35 MG tablet Commonly known as: MICRONOR Take 0.35 mg by mouth.   ondansetron 4 MG tablet Commonly known as: Zofran Take 1 tablet (4 mg total) by mouth every 8 (eight) hours as needed for nausea or vomiting.   pantoprazole 40 MG tablet Commonly known as: Protonix Take 1 tablet (40 mg total) by mouth 2 (two) times daily before a meal.   Vear Clock Colon Health Caps   predniSONE 10 MG tablet Commonly known as: DELTASONE 2 tablets twice daily by mouth for 2 days then 1 tablet twice daily by mouth for 2 days then 1 tablet daily for 2 days then 0.5 tablet by mouth daily for 2 days then off. What changed:  additional instructions Another medication with the same name was removed. Continue taking this medication, and follow the directions you see here. Changed by: Verlee Monte, MD   SUPER C COMPLEX PO   Timmothy Sours 93 MCG/ACT Exhu Generic drug: Fluticasone Propionate Place 93 mcg into the nose 2 (two) times daily. 2 sprays twice daily       Past Medical History:  Diagnosis Date   Allergic rhinitis    Allergy    Angio-edema    Asthma    Dysmenorrhea    Fatty liver    GERD (gastroesophageal reflux disease)    Hypertension    Recurrent upper respiratory infection (URI)    Sleep apnea    Tachycardia    Past Surgical History:  Procedure  Laterality Date   NO PAST SURGERIES     Otherwise, there have been no changes to her past medical history, surgical history, family history, or social history.  ROS: All others negative except as noted per HPI.   Objective:  BP (!) 150/80   Pulse (!) 109   Temp 97.9 F (36.6 C) (Temporal)   Resp 18   SpO2 95%  There is no height or weight on file to calculate BMI. Physical Exam: General Appearance:  Alert, cooperative, no distress, appears stated age, tearful  Head:  Normocephalic, without obvious abnormality, atraumatic  Eyes:  Conjunctiva clear, EOM's intact  Nose: Nares normal, normal mucosa  Neck: Supple, symmetrical  Lungs:   end-expiratory wheezing, coarse wheezing, Respirations unlabored, intermittent dry coughing  Heart:  Regular rhythm, slightly tachycardic and no murmur, Appears well perfused  Extremities: No edema  Skin: Skin color, texture, turgor normal, no rashes or lesions on visualized portions of skin  Neurologic: No gross deficits   Spirometry:  Deferred, flu A positive  Assessment/Plan   Acute Asthma Exacerbation in setting of Flu A:  She is moving great air but does have end-expiratory coarse wheezing throughout. No localized lung findings on exam. Will try increasing steroids to help with asthma exacerbation. Strict return precautions discussed in detail including when ED evaluation is warranted/necessary. - stop budesonide since you are felling worse when using - pause prednisone today - 80 mg IM methylprednisolone in clinic - continue levalbuterol + 1/2 atrovent vial via nebulizer every 6 hours x 3-5 days - tomorrow start prednisone 40 mg x 2 days, then 20 mg x 2 days then 10 mg x 2 days, then 5 mg (1/2 tablet) x 2 days then off. - hydroxyzine 25 mg nightly as needed for anxiety/allergies.  Follow rest of your asthma and allergy plans as before.  Follow-up with Dr. Dellis Anes as scheduled   Tonny Bollman, MD  [facility]

## 2022-05-30 NOTE — Patient Instructions (Addendum)
Acute Asthma Exacerbation in setting of Flu A:  - stop budesonide since you are felling worse - pause prednisone today - 80 mg IM methylprednisolone in clinic - continue levalbuterol + 1/2 atrovent vial via nebulizer every 6 hours x 3-5 days - tomorrow start prednisone 40 mg x 2 days, then 20 mg x 2 days then 10 mg x 2 days, then 5 mg (1/2 tablet) x 2 days then off. - hydroxyzine 25 mg nightly as needed for anxiety/allergies.   Follow rest of your asthma and allergy plans as before.

## 2022-05-31 ENCOUNTER — Ambulatory Visit: Payer: 59 | Admitting: Internal Medicine

## 2022-06-03 ENCOUNTER — Encounter: Payer: Self-pay | Admitting: Family Medicine

## 2022-06-03 ENCOUNTER — Other Ambulatory Visit: Payer: Self-pay

## 2022-06-03 ENCOUNTER — Ambulatory Visit: Payer: BC Managed Care – PPO | Admitting: Family Medicine

## 2022-06-03 ENCOUNTER — Telehealth: Payer: Self-pay

## 2022-06-03 ENCOUNTER — Ambulatory Visit (INDEPENDENT_AMBULATORY_CARE_PROVIDER_SITE_OTHER): Payer: BC Managed Care – PPO | Admitting: Family Medicine

## 2022-06-03 VITALS — BP 132/72 | HR 98 | Temp 98.7°F | Resp 20 | Ht 68.0 in | Wt 291.6 lb

## 2022-06-03 DIAGNOSIS — J4540 Moderate persistent asthma, uncomplicated: Secondary | ICD-10-CM | POA: Diagnosis not present

## 2022-06-03 DIAGNOSIS — J302 Other seasonal allergic rhinitis: Secondary | ICD-10-CM

## 2022-06-03 DIAGNOSIS — Z630 Problems in relationship with spouse or partner: Secondary | ICD-10-CM | POA: Diagnosis not present

## 2022-06-03 DIAGNOSIS — K219 Gastro-esophageal reflux disease without esophagitis: Secondary | ICD-10-CM | POA: Diagnosis not present

## 2022-06-03 DIAGNOSIS — F419 Anxiety disorder, unspecified: Secondary | ICD-10-CM | POA: Diagnosis not present

## 2022-06-03 DIAGNOSIS — J383 Other diseases of vocal cords: Secondary | ICD-10-CM

## 2022-06-03 DIAGNOSIS — J4541 Moderate persistent asthma with (acute) exacerbation: Secondary | ICD-10-CM | POA: Insufficient documentation

## 2022-06-03 DIAGNOSIS — J3089 Other allergic rhinitis: Secondary | ICD-10-CM | POA: Diagnosis not present

## 2022-06-03 DIAGNOSIS — B999 Unspecified infectious disease: Secondary | ICD-10-CM

## 2022-06-03 DIAGNOSIS — D806 Antibody deficiency with near-normal immunoglobulins or with hyperimmunoglobulinemia: Secondary | ICD-10-CM

## 2022-06-03 NOTE — Patient Instructions (Addendum)
Asthma- - Lung testing looked great today.  - Daily controller medication(s): Singulair '10mg'$  daily and Asmanex 1-2 puffs twice daily  - Prior to physical activity: Xopenex 2 puffs 10-15 minutes before physical activity. - Rescue medications: Xopenex 4 puffs every 4-6 hours as needed - Changes during respiratory infections or worsening symptoms: Add on Pulmicort 0.'5mg'$  to one treatment twice daily for TWO WEEKS. - Continue the prednisone taper you are currently taking.  - An order for one lab has been placed. We will call you when the result becomes available  - Asthma control goals:  * Full participation in all desired activities (may need albuterol before activity) * Albuterol use two time or less a week on average (not counting use with activity) * Cough interfering with sleep two time or less a month * Oral steroids no more than once a year * No hospitalizations  Allergic rhinitis - Continue saline rinses several times a day - Continue cetirizine 10 mg once a day as needed for a runny nose or itch. - Continue Xhance 2 sprays in each nostril twice a day for nasal symptoms  Recurrent infections - START YOUR HIZENTRA. - continue your azithromycin   Reflux - Continue dietary and lifestyle modifications as listed below - PLEASE CONTINUE pantoprazole twice a day to prevent reflux.  - Continue famotidine 20 mg twice a day to prevent reflux  Vocal cord dysfunction - Continue with speech therapy at home lessons as you are doing.  - Continue with the belly breathing exercises  Food allergy - Continue to avoid tree nuts.  In case of an allergic reaction, take Benadryl 50 mg every 4 hours, and if life-threatening symptoms occur, inject with AuviQ 0.3 mg. - We will get that mixed tree nut challenge done at some point.   Follow-up in 2 weeks sooner if needed.   Please inform us of any Emergency Department visits, hospitalizations, or changes in symptoms. Call us before going to the ED  for breathing or allergy symptoms since we might be able to fit you in for a sick visit. Feel free to contact us anytime with any questions, problems, or concerns.  It was a pleasure to see you today!  Websites that have reliable patient information: 1. American Academy of Asthma, Allergy, and Immunology: www.aaaai.org 2. Food Allergy Research and Education (FARE): foodallergy.org 3. Mothers of Asthmatics: http://www.asthmacommunitynetwork.org 4. American College of Allergy, Asthma, and Immunology: www.acaai.org

## 2022-06-03 NOTE — Patient Instructions (Incomplete)
Acute sinusitis:  - start azithromycin 500 mg continue for full 5 days, then stop  - next week start back on prophylaxis dosing -start your pulmicort twice daily for the next 1-2 weeks - 40 mg IM depomedrol today in clinic - mucinex [phone] mg twice daily as needed for mucus production; take with lots of water- c  Asthma- - Lung testing looked great today.  - Daily controller medication(s): Singulair '10mg'$  daily and Asmanex 1-2 puffs twice daily  - Prior to physical activity: Xopenex 2 puffs 10-15 minutes before physical activity. - Rescue medications: Xopenex 4 puffs every 4-6 hours as needed - Changes during respiratory infections or worsening symptoms: Add on Pulmicort 0.'5mg'$  to one treatment twice daily for TWO WEEKS. - Asthma control goals:  * Full participation in all desired activities (may need albuterol before activity) * Albuterol use two time or less a week on average (not counting use with activity) * Cough interfering with sleep two time or less a month * Oral steroids no more than once a year * No hospitalizations  Allergic rhinitis - Continue saline rinses several times a day - Continue cetirizine 10 mg once a day as needed for a runny nose or itch. - Continue Xhance 2 sprays in each nostril twice a day for nasal symptoms  Recurrent infections - START YOUR HIZENTRA. - continue your azithromycin   Reflux - Continue dietary and lifestyle modifications as listed below - PLEASE CONTINUE Dexilant 60 mg once a day to prevent reflux.  - Continue famotidine 20 mg twice a day to prevent reflux  Vocal cord dysfunction - Continue with speech therapy at home lessons as you are doing.   Food allergy - Continue to avoid tree nuts.  In case of an allergic reaction, take Benadryl 50 mg every 4 hours, and if life-threatening symptoms occur, inject with AuviQ 0.3 mg. - We will get that mixed tree nut challenge done at some point.   Follow-up in 6 months, sooner if  needed.   Please inform us of any Emergency Department visits, hospitalizations, or changes in symptoms. Call us before going to the ED for breathing or allergy symptoms since we might be able to fit you in for a sick visit. Feel free to contact us anytime with any questions, problems, or concerns.  It was a pleasure to see you today!  Websites that have reliable patient information: 1. American Academy of Asthma, Allergy, and Immunology: www.aaaai.org 2. Food Allergy Research and Education (FARE): foodallergy.org 3. Mothers of Asthmatics: http://www.asthmacommunitynetwork.org 4. American College of Allergy, Asthma, and Immunology: www.acaai.org

## 2022-06-03 NOTE — Progress Notes (Signed)
[place] [address] Dept: [phone]  FOLLOW UP NOTE  Patient ID: Nicole Mendoza, female    DOB: 02/22/1991  Age: 31 y.o. MRN: [id number] Date of Office Visit: 06/03/2022  Assessment  Chief Complaint: Asthma (Asthma Flare F/u - 05/30/22) and Follow-up  HPI Nicole Mendoza is a 31 year old female who presents to the clinic for evaluation of breathing.  She was last seen in this clinic on 05/30/2022 by Dr. [name] for evaluation of asthma with acute exacerbation, allergic rhinitis, reflux, food allergy to tree nuts, and recurrent infections on prophylactic azithromycin.  She was provided with a prescription of doxycycline over the first week in December for suspected URI and given IM Depo-Medrol around that time.  She was diagnosed with influenza A on 05/27/2022 at [facility] ED where she had a chest x-ray that indicated no acute cardiopulmonary abnormality.  Later that day she received a short prednisone taper from our on-call physician.  During her last visit to this clinic she received a methylprednisolone 80 mg IM injection as well as an extended prednisone taper.  At today's visit, she reports symptoms including shortness of breath, faint wheezing, and dry cough that began several weeks ago.  She reports the symptoms are exacerbated by any activity.  She continues montelukast 10 mg once a day, Asmanex 220-2 puffs twice a day, and has been taking Xopenex treatments every 4-6 hours over the last week with moderate improvement in symptoms.  Allergic rhinitis is reported as moderately well-controlled with symptoms including nasal congestion and occasional postnasal drainage.  She continues Xhance 2 sprays in each nostril twice a day and infrequently uses nasal saline rinses.  She continues azithromycin 3 days a week for history of recurrent infections.  She reports that she is ready to begin Hizentra infusions at home.  Reflux is reported as well-controlled with PPI twice a day.   She continues to avoid tree nuts with no accidental ingestion or EpiPen use since her last visit to this clinic.  Her current medications are listed in the chart.   Drug Allergies:  Allergies  Allergen Reactions   Budesonide-Formoterol Fumarate Other (See Comments)    Causes Hypertension   Other     Tree nuts   Penicillins Hives   Raspberry Hives   Spiriva Respimat [Tiotropium Bromide Monohydrate] Other (See Comments)    Pt states medication causes dizziness   Albuterol Palpitations   Losartan Rash    Physical Exam: BP 132/72   Pulse 98   Temp 98.7 F (37.1 C)   Resp 20   Ht '5\' 8"'$  (1.727 m)   Wt 291 lb 9.6 oz (132.3 kg)   SpO2 98%   BMI 44.34 kg/m    Physical Exam Vitals reviewed.  Constitutional:      Appearance: Normal appearance.  HENT:     Head: Normocephalic and atraumatic.     Right Ear: Tympanic membrane normal.     Left Ear: Tympanic membrane normal.     Nose:     Comments: Bilateral nares slightly erythematous with clear nasal drainage noted. Pharynx normal. Ears normal. Eyes normal. Eyes:     Conjunctiva/sclera: Conjunctivae normal.  Cardiovascular:     Rate and Rhythm: Normal rate and regular rhythm.     Heart sounds: Normal heart sounds. No murmur heard. Pulmonary:     Effort: Pulmonary effort is normal.     Breath sounds: Normal breath sounds.     Comments: Lungs clear to auscultation Musculoskeletal:  General: Normal range of motion.     Cervical back: Normal range of motion and neck supple.  Skin:    General: Skin is warm and dry.  Neurological:     Mental Status: She is alert and oriented to person, place, and time.  Psychiatric:        Mood and Affect: Mood normal.        Behavior: Behavior normal.        Thought Content: Thought content normal.        Judgment: Judgment normal.     Diagnostics: Postbronchodilator spirometry FVC 3.71 which is 85% of predicted value, FEV1 2.86 which is 78% of predicted value.  Spirometry indicates  normal ventilatory function.  Assessment and Plan: 1. Moderate persistent asthma with acute exacerbation   2. Seasonal and perennial allergic rhinitis   3. Gastroesophageal reflux disease, unspecified whether esophagitis present   4. Recurrent infections   5. 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 Vocal cord dysfunction      Patient Instructions  Asthma- - Lung testing looked great today.  - Daily controller medication(s): Singulair '10mg'$  daily and Asmanex 1-2 puffs twice daily  - Prior to physical activity: Xopenex 2 puffs 10-15 minutes before physical activity. - Rescue medications: Xopenex 4 puffs every 4-6 hours as needed - Changes during respiratory infections or worsening symptoms: Add on Pulmicort 0.'5mg'$  to one treatment twice daily for TWO WEEKS. - Continue the prednisone taper you are currently taking.  - An order for one lab has been placed. We will call you when the result becomes available  - Asthma control goals:  * Full participation in all desired activities (may need albuterol before activity) * Albuterol use two time or less a week on average (not counting use with activity) * Cough interfering with sleep two time or less a month * Oral steroids no more than once a year * No hospitalizations  Allergic rhinitis - Continue saline rinses several times a day - Continue cetirizine 10 mg once a day as needed for a runny nose or itch. - Continue Xhance 2 sprays in each nostril twice a day for nasal symptoms  Recurrent infections - START YOUR HIZENTRA. - continue your azithromycin   Reflux - Continue dietary and lifestyle modifications as listed below - PLEASE CONTINUE pantoprazole twice a day to prevent reflux.  - Continue famotidine 20 mg twice a day to prevent reflux  Vocal cord dysfunction - Continue with speech therapy at home lessons as you are doing.  - Continue with the belly breathing  exercises  Food allergy - Continue to avoid tree nuts.  In case of an allergic reaction, take Benadryl 50 mg every 4 hours, and if life-threatening symptoms occur, inject with AuviQ 0.3 mg. - We will get that mixed tree nut challenge done at some point.   Follow-up in 2 weeks sooner if needed.   Return in about 2 weeks (around 06/17/2022), or if symptoms worsen or fail to improve.    Thank you for the opportunity to care for this patient.  Please do not hesitate to contact me with questions.  Gareth Morgan, FNP [facility] [place]

## 2022-06-03 NOTE — Progress Notes (Deleted)
   [place] [address] Dept: [phone]  FOLLOW UP NOTE  Patient ID: [name], female    DOB: 22-Jan-1991  Age: 31 y.o. MRN: [id number] Date of Office Visit: 06/03/2022  Assessment  Chief Complaint: No chief complaint on file.  HPI Kenlyn Lose    Drug Allergies:  Allergies  Allergen Reactions   Budesonide-Formoterol Fumarate Other (See Comments)    Causes Hypertension   Other     Tree nuts   Penicillins Hives   Raspberry Hives   Spiriva Respimat [Tiotropium Bromide Monohydrate] Other (See Comments)    Pt states medication causes dizziness   Albuterol Palpitations   Losartan Rash    Physical Exam: There were no vitals taken for this visit.   Physical Exam  Diagnostics:    Assessment and Plan: No diagnosis found.  No orders of the defined types were placed in this encounter.   There are no Patient Instructions on file for this visit.  No follow-ups on file.    Thank you for the opportunity to care for this patient.  Please do not hesitate to contact me with questions.  Gareth Morgan, FNP [facility] [place] [facility] [place]

## 2022-06-03 NOTE — Telephone Encounter (Signed)
Per provider, called patient - DPR verified - LMOVM advising provider requested lab test - D-Dimer - to make sure all area were covered regarding the symptoms she is having. Patient advised blood work can be drawn at our office or a [facility].   Patient advised is she has any questions - she can message provider via myChart or contact our office.  Lab requisition has been printed and given to [facility].  Forwarding message to provider as update.

## 2022-06-04 ENCOUNTER — Ambulatory Visit: Payer: 59 | Admitting: Allergy & Immunology

## 2022-06-04 ENCOUNTER — Ambulatory Visit: Payer: BC Managed Care – PPO | Admitting: Allergy & Immunology

## 2022-06-04 ENCOUNTER — Encounter: Payer: Self-pay | Admitting: Family Medicine

## 2022-06-04 ENCOUNTER — Other Ambulatory Visit: Payer: Self-pay | Admitting: Family Medicine

## 2022-06-04 ENCOUNTER — Other Ambulatory Visit: Payer: Self-pay

## 2022-06-04 DIAGNOSIS — J4541 Moderate persistent asthma with (acute) exacerbation: Secondary | ICD-10-CM

## 2022-06-04 MED ORDER — PREDNISONE 10 MG PO TABS: ORAL_TABLET | ORAL | 0 refills | Status: DC

## 2022-06-05 ENCOUNTER — Encounter: Payer: Self-pay | Admitting: Allergy & Immunology

## 2022-06-05 ENCOUNTER — Ambulatory Visit (INDEPENDENT_AMBULATORY_CARE_PROVIDER_SITE_OTHER): Payer: BC Managed Care – PPO | Admitting: Physician Assistant

## 2022-06-05 ENCOUNTER — Ambulatory Visit (INDEPENDENT_AMBULATORY_CARE_PROVIDER_SITE_OTHER): Payer: BC Managed Care – PPO | Admitting: Allergy & Immunology

## 2022-06-05 ENCOUNTER — Other Ambulatory Visit: Payer: Self-pay

## 2022-06-05 VITALS — BP 138/90 | HR 106 | Temp 97.7°F | Resp 20

## 2022-06-05 DIAGNOSIS — J383 Other diseases of vocal cords: Secondary | ICD-10-CM

## 2022-06-05 DIAGNOSIS — J4540 Moderate persistent asthma, uncomplicated: Secondary | ICD-10-CM

## 2022-06-05 DIAGNOSIS — K219 Gastro-esophageal reflux disease without esophagitis: Secondary | ICD-10-CM | POA: Diagnosis not present

## 2022-06-05 DIAGNOSIS — J3089 Other allergic rhinitis: Secondary | ICD-10-CM

## 2022-06-05 DIAGNOSIS — J302 Other seasonal allergic rhinitis: Secondary | ICD-10-CM

## 2022-06-05 DIAGNOSIS — D806 Antibody deficiency with near-normal immunoglobulins or with hyperimmunoglobulinemia: Secondary | ICD-10-CM | POA: Diagnosis not present

## 2022-06-05 LAB — BASIC METABOLIC PANEL
BUN/Creatinine Ratio: 20 (ref 9–23)
BUN: 14 mg/dL (ref 6–20)
CO2: 19 mmol/L — ABNORMAL LOW (ref 20–29)
Calcium: 9.6 mg/dL (ref 8.7–10.2)
Chloride: 102 mmol/L (ref 96–106)
Creatinine, Ser: 0.7 mg/dL (ref 0.57–1.00)
Glucose: 102 mg/dL — ABNORMAL HIGH (ref 70–99)
Potassium: 3.6 mmol/L (ref 3.5–5.2)
Sodium: 138 mmol/L (ref 134–144)
eGFR: 119 mL/min/{1.73_m2} (ref >59)

## 2022-06-05 LAB — D-DIMER, QUANTITATIVE: D-DIMER: 0.29 mg/L FEU (ref 0.00–0.49)

## 2022-06-05 NOTE — Patient Instructions (Addendum)
Asthma- - We did a spiro and it looked perfect.  - Wean off the prednisone since I think that this is related to your tachycardia (and hence your shortness of breath). - Decrease to '10mg'$  ONCE daily for five days and then STOP.  - Daily controller medication(s): Singulair '10mg'$  daily and Asmanex 1-2 puffs twice daily  - Prior to physical activity: Xopenex 2 puffs 10-15 minutes before physical activity. - Rescue medications: Xopenex 4 puffs every 4-6 hours as needed - Changes during respiratory infections or worsening symptoms: Add on Pulmicort 0.'5mg'$  to one treatment twice daily for TWO WEEKS. - Continue the prednisone taper you are currently taking.  - An order for one lab has been placed. We will call you when the result becomes available - Asthma control goals:  * Full participation in all desired activities (may need albuterol before activity) * Albuterol use two time or less a week on average (not counting use with activity) * Cough interfering with sleep two time or less a month * Oral steroids no more than once a year * No hospitalizations  Allergic rhinitis - Continue saline rinses several times a day - Continue cetirizine 10 mg once a day as needed for a runny nose or itch. - Continue Xhance 2 sprays in each nostril twice a day for nasal symptoms  Recurrent infections - START YOUR HIZENTRA. - I will reach out to [name] to discuss this again.  - Continue your azithromycin   Reflux - Continue dietary and lifestyle modifications as listed below - PLEASE CONTINUE pantoprazole twice a day to prevent reflux.  - Continue famotidine 20 mg twice a day to prevent reflux. - Add Carafate TID for two weeks to add another protective layer against you GERD (which was likely worsened by the prednisone).   Vocal cord dysfunction - Continue with speech therapy at home lessons as you are doing.  - Continue with the belly breathing exercises  Food allergy - Continue to avoid tree nuts.  In  case of an allergic reaction, take Benadryl 50 mg every 4 hours, and if life-threatening symptoms occur, inject with AuviQ 0.3 mg. - We will get that mixed tree nut challenge done at some point.   Return in about 4 weeks (around 07/03/2022).    Please inform us of any Emergency Department visits, hospitalizations, or changes in symptoms. Call us before going to the ED for breathing or allergy symptoms since we might be able to fit you in for a sick visit. Feel free to contact us anytime with any questions, problems, or concerns.  It was a pleasure to see you again today!  Websites that have reliable patient information: 1. American Academy of Asthma, Allergy, and Immunology: www.aaaai.org 2. Food Allergy Research and Education (FARE): foodallergy.org 3. Mothers of Asthmatics: http://www.asthmacommunitynetwork.org 4. American College of Allergy, Asthma, and Immunology: www.acaai.org   COVID-19 Vaccine Information can be found at: [email or web address].co.uk For questions related to vaccine distribution or appointments, please email [email or web address] or call [phone].   We realize that you might be concerned about having an allergic reaction to the COVID19 vaccines. To help with that concern, WE ARE OFFERING THE COVID19 VACCINES IN OUR OFFICE! Ask the front desk for dates!     "Like" [place] on Facebook and Instagram for our latest updates!      A healthy democracy works best when [employer] participate! Make sure you are registered to vote! If you have moved or changed any of your contact information, you will need  to get this updated before voting!  In some cases, you MAY be able to register to vote online: [email or web address]

## 2022-06-05 NOTE — Progress Notes (Signed)
FOLLOW UP  Date of Service/Encounter:  r12/20/23   Assessment:   Mild persistent asthma with acute exacerbation with multiple adverse reactions to a number of inhalers   Vocal cord dysfunction - graduated from speech therapy    Recurrent infections - improved following Pneumovax, but then worsened (streptococcal avidity assay abnormal) - needs to start Hizentra    Seasonal and perennial allergic rhinitis (trees, weeds, grasses and dust mites) - not well controlled    Gastroesophageal reflux disease - on Dexilant (recently started) and famotidine and PRN carafate    Adverse food reactions - resolved with minimizing exposure to food additives and avoiding peanuts/tree nuts   Snoring with perceived poor sleep quality - improved with use of the CPAP   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  Plan/Recommendations:   Asthma- - We did a spiro and it looked perfect.  - Wean off the prednisone since I think that this is related to your tachycardia (and hence your shortness of breath). - Decrease to 60m ONCE daily for five days and then STOP.  - Daily controller medication(s): Singulair 123mdaily and Asmanex 1-2 puffs twice daily  - Prior to physical activity: Xopenex 2 puffs 10-15 minutes before physical activity. - Rescue medications: Xopenex 4 puffs every 4-6 hours as needed - Changes during respiratory infections or worsening symptoms: Add on Pulmicort 0.65m58mo one treatment twice daily for TWO WEEKS. - Continue the prednisone taper you are currently taking.  - An order for one lab has been placed. We will call you when the result becomes available - Asthma control goals:  * Full participation in all desired activities (may need albuterol before activity) * Albuterol use two time or less a week on average (not counting use with activity) * Cough interfering with sleep two time or less a month * Oral steroids no more than once a year * No  hospitalizations  Allergic rhinitis - Continue saline rinses several times a day - Continue cetirizine 10 mg once a day as needed for a runny nose or itch. - Continue Xhance 2 sprays in each nostril twice a day for nasal symptoms  Recurrent infections - START YOUR HIZENTRA. - I will reach out to [name] to discuss this again.  - Continue your azithromycin   Reflux - Continue dietary and lifestyle modifications as listed below - PLEASE CONTINUE pantoprazole twice a day to prevent reflux.  - Continue famotidine 20 mg twice a day to prevent reflux. - Add Carafate TID for two weeks to add another protective layer against you GERD (which was likely worsened by the prednisone).   Vocal cord dysfunction - Continue with speech therapy at home lessons as you are doing.  - Continue with the belly breathing exercises  Food allergy - Continue to avoid tree nuts.  In case of an allergic reaction, take Benadryl 50 mg every 4 hours, and if life-threatening symptoms occur, inject with AuviQ 0.3 mg. - We will get that mixed tree nut challenge done at some point.   Return in about 4 weeks (around 07/03/2022).    Subjective:   Nicole Mendoza a 31 35o. 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 11/22/2020   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 ([place]) 11/19/2020   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   Recurrent sinusitis 11/09/2020   Pre-nodular edema of the vocal folds 11/09/2020   Laryngospasms 11/09/2020   ASCUS of cervix with negative high risk HPV 07/10/2020   Cardiac murmur 09/02/2019   Tachycardia 09/01/2019   Salivary stone 09/01/2019   Anaphylactic shock due to adverse food reaction 01/22/2019   Seasonal and perennial allergic rhinitis 01/22/2019   Morbid obesity with BMI of 45.0-49.9, adult ([place]) 10/28/2017   Moderate persistent asthma without complication 93/73/4287   Essential hypertension 04/22/2017   Dysmenorrhea 06/26/2016   Menorrhagia with regular cycle 06/26/2016   Gastroesophageal reflux disease 12/22/2015   Vitamin D deficiency 11/02/2014    History obtained from: chart review and patient.  Nicole Mendoza is a 31 y.o. female presenting for a follow up visit.  She was last seen in December 2023.  At that time, a couple of days ago, her lung testing looked great.  She was continued on Singulair as well as Asmanex 1 to 2 puffs twice daily.  She has Xopenex to use for flares.  She also has Pulmicort that she mixes with Xopenex nebulizer solution twice daily for 2 weeks.  We continued her on the prednisone taper.  For her allergic rhinitis, she was continued on cetirizine as well as Xhance.  She has been approved for Hizentra for over a year now but was never started.  He was encouraged to do so.  For her reflux, she was continued on Protonix.  She has concern for vocal cord dysfunction and she was continued on speech therapy at home.  She continues to avoid all tree nuts.  Since last visit, she has not done well. She got up this morning and she got going and got herself dressed. For the first time in 9 days, she was able to make her breakfast. She had some tightness in her chest. She had her prednisone. She is unsure where she is helping at all. She has noted that her HR has been changing often. She had 150 bpm sometime this morning which was after the  prednisone. She had four puffs of Xopenex.  She had a high heart rate. She took 100 mg metoprolol at this time.   She had met with [name] earlier this week and they came to the conclusion that this is all related to vocal cord dysfunction. She has not been doing the breathing exercises. She is going to pick those up today.   She is seeing Dr. Caryl Comes in Cardiology. Dr. Caryl Comes has never felt that this is officially POTS, although she has not gotten another opinion. Dr. Gena Fray also felt that this was post viral in nature.   She has been on prednisone for10 days or so. Currently she is on 41m BID. She has been on 152mBID since Saturday or Sunday or Monday. She is able to remember when she started the prednisone.   She wants a plan on what to do over the holidays if she has a flare up.   Otherwise, there have been no changes to her past medical history, surgical history,  family history, or social history.    Review of Systems  Constitutional: Negative.  Negative for chills, fever, malaise/fatigue and weight loss.  HENT: Negative.  Negative for congestion, ear discharge, ear pain and sinus pain.   Eyes:  Negative for pain, discharge and redness.  Respiratory:  Positive for cough, shortness of breath and wheezing. Negative for sputum production.   Cardiovascular: Negative.  Negative for chest pain and palpitations.  Gastrointestinal:  Negative for abdominal pain, constipation, diarrhea, heartburn, nausea and vomiting.  Skin: Negative.  Negative for itching and rash.  Neurological:  Negative for dizziness and headaches.  Endo/Heme/Allergies:  Negative for environmental allergies. Does not bruise/bleed easily.       Objective:   Blood pressure (!) 138/90, pulse (!) 106, temperature 97.7 F (36.5 C), temperature source Oral, resp. rate 20, SpO2 96 %. There is no height or weight on file to calculate BMI.    Physical Exam Vitals reviewed.  Constitutional:      Appearance: She is  well-developed. She is obese.     Comments: Talkative as always. Anxious.   HENT:     Head: Normocephalic and atraumatic.     Right Ear: Tympanic membrane, ear canal and external ear normal.     Left Ear: Tympanic membrane, ear canal and external ear normal.     Nose: No nasal deformity, septal deviation, mucosal edema or rhinorrhea.     Right Turbinates: Enlarged, swollen and pale.     Left Turbinates: Enlarged, swollen and pale.     Right Sinus: No maxillary sinus tenderness or frontal sinus tenderness.     Left Sinus: No maxillary sinus tenderness or frontal sinus tenderness.     Mouth/Throat:     Mouth: Mucous membranes are not pale and not dry.     Pharynx: Uvula midline. No pharyngeal swelling or oropharyngeal exudate.     Tonsils: 2+ on the right. 2+ on the left.  Eyes:     General: Lids are normal. No allergic shiner.       Right eye: No discharge.        Left eye: No discharge.     Conjunctiva/sclera:     Right eye: Right conjunctiva is injected. Chemosis present.     Left eye: Left conjunctiva is injected. Chemosis present.     Pupils: Pupils are equal, round, and reactive to light.  Cardiovascular:     Rate and Rhythm: Normal rate and regular rhythm.     Heart sounds: Normal heart sounds.  Pulmonary:     Effort: Pulmonary effort is normal. No tachypnea, accessory muscle usage or respiratory distress.     Breath sounds: Normal breath sounds. No wheezing, rhonchi or rales.     Comments: Moving air well in all lung fields.  No increased work of breathing. Chest:     Chest wall: No tenderness.  Lymphadenopathy:     Cervical: No cervical adenopathy.  Skin:    General: Skin is warm.     Capillary Refill: Capillary refill takes less than 2 seconds.     Coloration: Skin is not pale.     Findings: No abrasion, erythema, petechiae or rash. Rash is not papular, urticarial or vesicular.     Comments: No eczematous or urticarial lesions noted.  Neurological:     Mental Status:  She is alert.  Psychiatric:        Behavior: Behavior is cooperative.      Diagnostic studies:    Spirometry: results normal (FEV1: 2.64/73%,  FVC: 3.54/81%, FEV1/FVC: 75%).    Spirometry consistent with normal pattern.   Allergy Studies: none        Salvatore Marvel, MD  [facility]

## 2022-06-05 NOTE — Progress Notes (Signed)
Can you please let this patient know that her d dimer lab test was normal? Please check with how she is breathing. Thank you

## 2022-06-06 ENCOUNTER — Telehealth: Payer: Self-pay | Admitting: *Deleted

## 2022-06-06 NOTE — Telephone Encounter (Signed)
Patient called to inquire on status of Hizentra. I advised her I was working on Nurse, children's and will reach out once decision is made to advise

## 2022-06-06 NOTE — Telephone Encounter (Signed)
-----   Message from Clemon Chambers, MD sent at 05/27/2022 10:46 AM EST ----- Hi Joel-she is ready to start hizentra.  I think she sent you a message over the weekend, but I told her I would follow-up to let you know. She has fever and sounds like very tight chest. I sent her to UC for CXR, viral swabs and for in person evaluation. She needs more steroids it sounds like too.  Yvett Rossel-she states she has hizentra that is still good, but doesn't have home health set-up. I think she has called and cancelled before.  Anyway-wanted to let you both know and see how I can help move things forward.  Thanks! Junie Panning

## 2022-06-07 ENCOUNTER — Ambulatory Visit (INDEPENDENT_AMBULATORY_CARE_PROVIDER_SITE_OTHER): Payer: BC Managed Care – PPO | Admitting: Pulmonary Disease

## 2022-06-07 ENCOUNTER — Ambulatory Visit: Payer: BC Managed Care – PPO | Admitting: Family Medicine

## 2022-06-07 ENCOUNTER — Encounter: Payer: Self-pay | Admitting: Family Medicine

## 2022-06-07 VITALS — BP 140/100 | HR 116 | Ht 68.0 in | Wt 293.8 lb

## 2022-06-07 VITALS — BP 170/86 | HR 115 | Temp 97.6°F | Ht 68.0 in | Wt 293.0 lb

## 2022-06-07 DIAGNOSIS — I10 Essential (primary) hypertension: Secondary | ICD-10-CM

## 2022-06-07 DIAGNOSIS — J383 Other diseases of vocal cords: Secondary | ICD-10-CM

## 2022-06-07 DIAGNOSIS — J4541 Moderate persistent asthma with (acute) exacerbation: Secondary | ICD-10-CM

## 2022-06-07 DIAGNOSIS — R000 Tachycardia, unspecified: Secondary | ICD-10-CM

## 2022-06-07 DIAGNOSIS — Z6841 Body Mass Index (BMI) 40.0 and over, adult: Secondary | ICD-10-CM | POA: Diagnosis not present

## 2022-06-07 DIAGNOSIS — J4540 Moderate persistent asthma, uncomplicated: Secondary | ICD-10-CM

## 2022-06-07 MED ORDER — PREDNISONE 20 MG PO TABS: ORAL_TABLET | ORAL | 0 refills | Status: AC

## 2022-06-07 NOTE — Progress Notes (Signed)
[facility] [address] [place] [address] Dept: [phone] Dept Fax: [phone]  Office Visit  Subjective:    Patient ID: Nicole Mendoza, female    DOB: 01/01/1991, 31 y.o..   MRN: [id number]  Chief Complaint  Patient presents with   Follow-up    Recheck for FMLA, c/o having lagering fatigue, SOB/Wheezing after having the flu on 05/26/22.      History of Present Illness:  Patient is in today for reassessment of her breathing. Nicole Mendoza has a history of moderate persistent asthma, allergic rhinitis, and a specific antibody deficiency. She notes that 3 weeks ago, she developed a sinus infection. She is normally on azithromycin for prophylaxis of infections. On 12/4, she was prescribed a course of doxycycline and budesonide nebs. On 12/11, she was running a fever of 102 F and was advised to go have a chest x-ray (which was clear). However, she tested positive for influenza A. She followed up with her allergist on 12/14. She was having an exacerbation of her asthma. They stopped the budesonide nebs and gave her IM methylprednisolone, then started her on a prednisone taper. On 12/18, she had Pulmicort added back to her regimen. Throughout this time, her blood pressure and her pulse have been running higher. Nicole Mendoza notes today, she is having ongoing wheezing and cough. She has a rattle in her chest that is "driving [her] crazy". She is using her Xopenox, but finds it does push her heart rate up. She has a pre-existing issue with tachycardia and possibly POTS. She has also noted her blood pressures to be elevated at home.  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 ([place]) 11/19/2020   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   Recurrent sinusitis 11/09/2020   Pre-nodular edema of the vocal folds 11/09/2020   Laryngospasms 11/09/2020   ASCUS of cervix with negative high risk HPV 07/10/2020   Cardiac murmur 09/02/2019   Tachycardia 09/01/2019   Salivary stone 09/01/2019   Anaphylactic shock due to adverse food reaction 01/22/2019   Seasonal and perennial allergic rhinitis 01/22/2019   Morbid obesity with BMI of 45.0-49.9, adult ([place]) 10/28/2017   Moderate persistent asthma without complication 40/97/3532   Essential hypertension 04/22/2017   Dysmenorrhea 06/26/2016   Menorrhagia with regular cycle 06/26/2016   Gastroesophageal reflux disease 12/22/2015   Vitamin D deficiency 11/02/2014   Past Surgical History:  Procedure Laterality Date   NO PAST SURGERIES     Family History  Adopted: Yes  Problem Relation Age of Onset   Asthma Mother    Diabetes Mother    High blood pressure Mother    Heart disease Mother    Bipolar disorder Mother    Sleep apnea Mother    Obesity Mother    Allergic rhinitis Father    Asthma Father    Diabetes Father    High blood pressure Father    Hyperlipidemia Father    Obesity Father    Allergic  rhinitis Sister    Asthma Sister    Angioedema Neg Hx    Atopy Neg Hx    Eczema Neg Hx    Immunodeficiency Neg Hx    Urticaria Neg Hx    Outpatient Medications Prior to Visit  Medication Sig Dispense Refill   Ascorbic Acid (SUPER C COMPLEX PO)      ASMANEX, 30 METERED DOSES, 220 MCG/ACT inhaler INHALE 2 PUFFS INTO THE LUNGS TWICE A DAY 5 each 0   budesonide (PULMICORT) 0.5 MG/2ML nebulizer solution Take 2 mLs (0.5 mg total) by  nebulization as needed. 60 mL 2   cetirizine (ZYRTEC) 10 MG tablet Take 10 mg by mouth daily.     Cholecalciferol 100 MCG (4000 UT) CAPS Take 1 capsule (4,000 Units total) by mouth daily. 30 capsule 0   dexlansoprazole (DEXILANT) 60 MG capsule TAKE 1 CAPSULE BY MOUTH EVERY DAY 90 capsule 1   EPINEPHrine (AUVI-Q) 0.3 mg/0.3 mL IJ SOAJ injection Use as directed for severe allergic reactions 4 each 1   ferrous sulfate 325 (65 FE) MG tablet 3 (three) times a week.      Fluticasone Propionate (XHANCE) 93 MCG/ACT EXHU Place 93 mcg into the nose 2 (two) times daily. 2 sprays twice daily     hydrOXYzine (ATARAX) 25 MG tablet Take 1 tablet (25 mg total) by mouth every 6 (six) hours as needed for anxiety (allergies). 30 tablet 1   ipratropium (ATROVENT) 0.02 % nebulizer solution Take 1.25 mLs (0.25 mg total) by nebulization 4 (four) times daily. Mix 0.5 vial in with levalbuterol nebulizer and can use every 6 hours as needed for shortness of breath and wheezing. 75 mL 3   levalbuterol (XOPENEX HFA) 45 MCG/ACT inhaler Inhale 2 puffs into the lungs every 6 (six) hours as needed. 1 each 3   levalbuterol (XOPENEX) 1.25 MG/3ML nebulizer solution USE 1 VIAL BY NEBULIZATION EVERY 6 (SIX) HOURS AS NEEDED FOR WHEEZING. 540 mL 1   metoprolol succinate (TOPROL-XL) 100 MG 24 hr tablet TAKE 1 TABLET BY MOUTH DAILY. TAKE WITH OR IMMEDIATELY FOLLOWING A MEAL. 90 tablet 1   mometasone (ASMANEX, 120 METERED DOSES,) 220 MCG/ACT inhaler Inhale 2 puffs into the lungs 2 (two) times daily. 1 each 5   montelukast (SINGULAIR) 10 MG tablet TAKE 1 TABLET BY MOUTH EVERY DAY 90 tablet 1   norethindrone (MICRONOR) 0.35 MG tablet Take 0.35 mg by mouth.     ondansetron (ZOFRAN) 4 MG tablet Take 1 tablet (4 mg total) by mouth every 8 (eight) hours as needed for nausea or vomiting. 20 tablet 0   predniSONE (DELTASONE) 10 MG tablet 2 tablets twice daily by mouth for 2 days then 1 tablet twice daily by mouth for 2 days then 1 tablet daily for 2  days then 0.5 tablet by mouth daily for 2 days then off. 15 tablet 0   predniSONE (DELTASONE) 10 MG tablet 1 tablet twice a day for 3 days 6 tablet 0   Prenatal Vit-Fe Fumarate-FA (MULTIVITAMIN-PRENATAL) 27-0.8 MG TABS tablet Take 1 tablet by mouth daily at 12 noon.     Probiotic Product ([facility] COLON HEALTH) CAPS      famotidine (PEPCID) 20 MG tablet TAKE 1 TABLET BY MOUTH TWICE A DAY (Patient not taking: Reported on 06/07/2022) 180 tablet 1   pantoprazole (PROTONIX) 40 MG tablet Take 1 tablet (40 mg total) by mouth 2 (two) times daily before a meal. (Patient not taking: Reported on 06/07/2022) 30 tablet 0   No facility-administered  medications prior to visit.   Allergies  Allergen Reactions   Budesonide-Formoterol Fumarate Other (See Comments)    Causes Hypertension   Other     Tree nuts   Penicillins Hives   Raspberry Hives   Spiriva Respimat [Tiotropium Bromide Monohydrate] Other (See Comments)    Pt states medication causes dizziness   Albuterol Palpitations   Losartan Rash   Objective:   Today's Vitals   06/07/22 1005  BP: (!) 170/86  Pulse: (!) 115  Temp: 97.6 F (36.4 C)  TempSrc: Temporal  SpO2: 99%  Weight: 293 lb (132.9 kg)  Height: '5\' 8"'$  (1.727 m)   Body mass index is 44.55 kg/m.   General: Well developed, well nourished. No acute distress. HEENT: Normocephalic, non-traumatic. Conjunctiva clear. External ears normal. EAC and TMs normal   bilaterally. Nose clear without congestion or rhinorrhea. Mucous membranes moist. Oropharynx clear.   Good dentition. Neck: Supple. No lymphadenopathy. No thyromegaly. Lungs: Diffuse wheezing throughout. No tachypnea. Some mucousy breath sounds that clear with cough. CV: Tachycardic. RRR without murmurs or rubs. Pulses 2+ bilaterally. Psych: Alert and oriented. Normal mood and affect.  Health Maintenance Due  Topic Date Due   Hepatitis C Screening  Never done     Assessment & Plan:   1. Moderate persistent asthma  with exacerbation I will increase her prednisone up to 60 mg daily and do a slower taper. She is scheduled to see me next week, so we will follow-up with her then. I did review parameters for her to monitor at home. She hsoudl go tot he ED if she is doing worse. I completed FMLA paperwork today.  - predniSONE (DELTASONE) 20 MG tablet; Take 3 tablets (60 mg total) by mouth daily with breakfast for 4 days, THEN 2.5 tablets (50 mg total) daily with breakfast for 4 days, THEN 2 tablets (40 mg total) daily with breakfast for 4 days, THEN 1.5 tablets (30 mg total) daily with breakfast for 4 days, THEN 1 tablet (20 mg total) daily with breakfast for 4 days, THEN 0.5 tablets (10 mg total) daily with breakfast for 4 days.  Dispense: 42 tablet; Refill: 0  2. Essential hypertension Blood pressure is high. She is to monitor this at home. If pressures are staying consistently > 180/90, recommend she go to the ED.  3. Tachycardia Likely exacerbated by beta agonists, and steroids. She should push hydration and monitor.   Return for As scheduled.   [name], MD

## 2022-06-07 NOTE — Progress Notes (Signed)
Synopsis: Referred in Dec 2023 for wh by [name], MD  Subjective:   PATIENT ID: Nicole Mendoza GENDER: female DOB: 06-Jan-1991, MRN: [id number]  Chief Complaint  Patient presents with   Consult    Flu positive, asthma, SOB with walking.    This is a 31 year old female, past medical history of gastroesophageal reflux, obesity, BMI 44, sleep apnea, hypertension, longstanding history of asthma as well as VCD.  She recently saw her primary care provider had ongoing symptoms chest tightness shortness of breath and diffuse wheezing was increased on her steroids.  However seen today in the office she is clear no symptoms no wheezing.  I suspect she was having a VCD flare at the time.  I did speak directly with Dr. Ernst Bowler her asthma/allergist today via phone.    Past Medical History:  Diagnosis Date   Allergic rhinitis    Allergy    Angio-edema    Asthma    Dysmenorrhea    Fatty liver    GERD (gastroesophageal reflux disease)    Hypertension    Recurrent upper respiratory infection (URI)    Sleep apnea    Tachycardia      Family History  Adopted: Yes  Problem Relation Age of Onset   Asthma Mother    Diabetes Mother    High blood pressure Mother    Heart disease Mother    Bipolar disorder Mother    Sleep apnea Mother    Obesity Mother    Allergic rhinitis Father    Asthma Father    Diabetes Father    High blood pressure Father    Hyperlipidemia Father    Obesity Father    Allergic rhinitis Sister    Asthma Sister    Angioedema Neg Hx    Atopy Neg Hx    Eczema Neg Hx    Immunodeficiency Neg Hx    Urticaria Neg Hx      Past Surgical History:  Procedure Laterality Date   NO PAST SURGERIES      Social History   Socioeconomic History   Marital status: Married    Spouse name: [name]   Number of children: 0   Years of education: Not on file   Highest education level: Not on file  Occupational History   Occupation: Barrister's clerk  Tobacco Use   Smoking  status: Never   Smokeless tobacco: Never  Vaping Use   Vaping Use: Never used  Substance and Sexual Activity   Alcohol use: No    Alcohol/week: 0.0 standard drinks of alcohol   Drug use: No   Sexual activity: Yes    Birth control/protection: Pill  Other Topics Concern   Not on file  Social History Narrative   Not on file   Social Determinants of Health   Financial Resource Strain: Not on file  Food Insecurity: Not on file  Transportation Needs: Not on file  Physical Activity: Not on file  Stress: Not on file  Social Connections: Not on file  Intimate Partner Violence: Not on file     Allergies  Allergen Reactions   Budesonide-Formoterol Fumarate Other (See Comments)    Causes Hypertension   Other     Tree nuts   Penicillins Hives   Raspberry Hives   Spiriva Respimat [Tiotropium Bromide Monohydrate] Other (See Comments)    Pt states medication causes dizziness   Albuterol Palpitations   Losartan Rash     Outpatient Medications Prior to Visit  Medication Sig Dispense Refill  budesonide (PULMICORT) 0.5 MG/2ML nebulizer solution Take 2 mLs (0.5 mg total) by nebulization as needed. 60 mL 2   cetirizine (ZYRTEC) 10 MG tablet Take 10 mg by mouth daily.     ipratropium (ATROVENT) 0.02 % nebulizer solution Take 1.25 mLs (0.25 mg total) by nebulization 4 (four) times daily. Mix 0.5 vial in with levalbuterol nebulizer and can use every 6 hours as needed for shortness of breath and wheezing. 75 mL 3   levalbuterol (XOPENEX HFA) 45 MCG/ACT inhaler Inhale 2 puffs into the lungs every 6 (six) hours as needed. 1 each 3   levalbuterol (XOPENEX) 1.25 MG/3ML nebulizer solution USE 1 VIAL BY NEBULIZATION EVERY 6 (SIX) HOURS AS NEEDED FOR WHEEZING. 540 mL 1   montelukast (SINGULAIR) 10 MG tablet TAKE 1 TABLET BY MOUTH EVERY DAY 90 tablet 1   predniSONE (DELTASONE) 20 MG tablet Take 3 tablets (60 mg total) by mouth daily with breakfast for 4 days, THEN 2.5 tablets (50 mg total) daily with  breakfast for 4 days, THEN 2 tablets (40 mg total) daily with breakfast for 4 days, THEN 1.5 tablets (30 mg total) daily with breakfast for 4 days, THEN 1 tablet (20 mg total) daily with breakfast for 4 days, THEN 0.5 tablets (10 mg total) daily with breakfast for 4 days. 42 tablet 0   Ascorbic Acid (SUPER C COMPLEX PO)      ASMANEX, 30 METERED DOSES, 220 MCG/ACT inhaler INHALE 2 PUFFS INTO THE LUNGS TWICE A DAY 5 each 0   Cholecalciferol 100 MCG (4000 UT) CAPS Take 1 capsule (4,000 Units total) by mouth daily. 30 capsule 0   dexlansoprazole (DEXILANT) 60 MG capsule TAKE 1 CAPSULE BY MOUTH EVERY DAY 90 capsule 1   EPINEPHrine (AUVI-Q) 0.3 mg/0.3 mL IJ SOAJ injection Use as directed for severe allergic reactions 4 each 1   famotidine (PEPCID) 20 MG tablet TAKE 1 TABLET BY MOUTH TWICE A DAY (Patient not taking: Reported on 06/07/2022) 180 tablet 1   ferrous sulfate 325 (65 FE) MG tablet 3 (three) times a week.      Fluticasone Propionate (XHANCE) 93 MCG/ACT EXHU Place 93 mcg into the nose 2 (two) times daily. 2 sprays twice daily     hydrOXYzine (ATARAX) 25 MG tablet Take 1 tablet (25 mg total) by mouth every 6 (six) hours as needed for anxiety (allergies). 30 tablet 1   metoprolol succinate (TOPROL-XL) 100 MG 24 hr tablet TAKE 1 TABLET BY MOUTH DAILY. TAKE WITH OR IMMEDIATELY FOLLOWING A MEAL. 90 tablet 1   mometasone (ASMANEX, 120 METERED DOSES,) 220 MCG/ACT inhaler Inhale 2 puffs into the lungs 2 (two) times daily. 1 each 5   norethindrone (MICRONOR) 0.35 MG tablet Take 0.35 mg by mouth.     ondansetron (ZOFRAN) 4 MG tablet Take 1 tablet (4 mg total) by mouth every 8 (eight) hours as needed for nausea or vomiting. 20 tablet 0   predniSONE (DELTASONE) 10 MG tablet 2 tablets twice daily by mouth for 2 days then 1 tablet twice daily by mouth for 2 days then 1 tablet daily for 2 days then 0.5 tablet by mouth daily for 2 days then off. 15 tablet 0   predniSONE (DELTASONE) 10 MG tablet 1 tablet twice a day  for 3 days 6 tablet 0   Prenatal Vit-Fe Fumarate-FA (MULTIVITAMIN-PRENATAL) 27-0.8 MG TABS tablet Take 1 tablet by mouth daily at 12 noon.     Probiotic Product ([place]) CAPS      No  facility-administered medications prior to visit.    Review of Systems  Constitutional:  Negative for chills, fever, malaise/fatigue and weight loss.  HENT:  Negative for hearing loss, sore throat and tinnitus.   Eyes:  Negative for blurred vision and double vision.  Respiratory:  Positive for shortness of breath and wheezing. Negative for cough, hemoptysis, sputum production and stridor.        Intermittent episodes.  Cardiovascular:  Negative for chest pain, palpitations, orthopnea, leg swelling and PND.  Gastrointestinal:  Negative for abdominal pain, constipation, diarrhea, heartburn, nausea and vomiting.  Genitourinary:  Negative for dysuria, hematuria and urgency.  Musculoskeletal:  Negative for joint pain and myalgias.  Skin:  Negative for itching and rash.  Neurological:  Negative for dizziness, tingling, weakness and headaches.  Endo/Heme/Allergies:  Negative for environmental allergies. Does not bruise/bleed easily.  Psychiatric/Behavioral:  Negative for depression. The patient is not nervous/anxious and does not have insomnia.   All other systems reviewed and are negative.    Objective:  Physical Exam Vitals reviewed.  Constitutional:      General: She is not in acute distress.    Appearance: She is well-developed. She is obese.  HENT:     Head: Normocephalic and atraumatic.  Eyes:     General: No scleral icterus.    Conjunctiva/sclera: Conjunctivae normal.     Pupils: Pupils are equal, round, and reactive to light.  Neck:     Vascular: No JVD.     Trachea: No tracheal deviation.  Cardiovascular:     Rate and Rhythm: Normal rate and regular rhythm.     Heart sounds: Normal heart sounds. No murmur heard. Pulmonary:     Effort: Pulmonary effort is normal. No tachypnea,  accessory muscle usage or respiratory distress.     Breath sounds: No stridor. No wheezing, rhonchi or rales.  Abdominal:     General: There is no distension.     Palpations: Abdomen is soft.     Tenderness: There is no abdominal tenderness.  Musculoskeletal:        General: No tenderness.     Cervical back: Neck supple.  Lymphadenopathy:     Cervical: No cervical adenopathy.  Skin:    General: Skin is warm and dry.     Capillary Refill: Capillary refill takes less than 2 seconds.     Findings: No rash.  Neurological:     Mental Status: She is alert and oriented to person, place, and time.  Psychiatric:        Behavior: Behavior normal.      Vitals:   06/07/22 1334  BP: (!) 140/100  Pulse: (!) 116  SpO2: 96%  Weight: 293 lb 12.8 oz (133.3 kg)  Height: '5\' 8"'$  (1.727 m)   96% on RA BMI Readings from Last 3 Encounters:  06/07/22 44.67 kg/m  06/07/22 44.55 kg/m  06/03/22 44.34 kg/m   Wt Readings from Last 3 Encounters:  06/07/22 293 lb 12.8 oz (133.3 kg)  06/07/22 293 lb (132.9 kg)  06/03/22 291 lb 9.6 oz (132.3 kg)     CBC    Component Value Date/Time   WBC 8.3 12/26/2021 1134   WBC 7.9 07/09/2021 1132   RBC 4.88 12/26/2021 1134   RBC 4.70 07/09/2021 1132   HGB 13.6 12/26/2021 1134   HCT 42.0 12/26/2021 1134   PLT 291 12/26/2021 1134   MCV 86 12/26/2021 1134   MCH 27.9 12/26/2021 1134   MCH 28.6 05/09/2021 1415   MCHC 32.4 12/26/2021  1134   MCHC 32.9 07/09/2021 1132   RDW 13.1 12/26/2021 1134   LYMPHSABS 1.9 12/26/2021 1134   MONOABS 0.7 07/09/2021 1132   EOSABS 0.1 12/26/2021 1134   BASOSABS 0.1 12/26/2021 1134     Chest Imaging: No recent chest imaging  Pulmonary Functions Testing Results:     No data to display          FeNO:   Pathology:   Echocardiogram:   Heart Catheterization:     Assessment & Plan:     ICD-10-CM   1. Vocal cord dysfunction  J38.3     2. Morbid obesity with BMI of 45.0-49.9, adult ([place])  E66.01     Z68.42     3. Moderate persistent asthma without complication  Z61.09       Discussion:  This is a 31 year old female managed for mild persistent asthma, multiple adverse reactions to previous inhalers, vocal cord dysfunction, has worked with speech therapy, has not seen them in greater than a year.  Recurrent infections.  Gastroesophageal reflux, sleep apnea on CPAP.  She recently saw her PCP today recommending increasing her steroid dosing.  However after seeing her today in the office she is completely clear on exam today.  She has no chest tightness no wheezing able to talk in complete sentences.  Plan: I suspect she had a flare of her VCD. I do not think she has a asthma exacerbation ongoing. She needs to taper off of her prednisone as guided by Dr. Ernst Bowler which she saw 2 days ago. Long discussion today in the office regarding management of her VCD.  It seems that anxiety seems to be a trigger for her. Follow-up with allergist and [place] in a couple months. She already has an appointment scheduled with Dr. Darnell Level.    Current Outpatient Medications:    budesonide (PULMICORT) 0.5 MG/2ML nebulizer solution, Take 2 mLs (0.5 mg total) by nebulization as needed., Disp: 60 mL, Rfl: 2   cetirizine (ZYRTEC) 10 MG tablet, Take 10 mg by mouth daily., Disp: , Rfl:    ipratropium (ATROVENT) 0.02 % nebulizer solution, Take 1.25 mLs (0.25 mg total) by nebulization 4 (four) times daily. Mix 0.5 vial in with levalbuterol nebulizer and can use every 6 hours as needed for shortness of breath and wheezing., Disp: 75 mL, Rfl: 3   levalbuterol (XOPENEX HFA) 45 MCG/ACT inhaler, Inhale 2 puffs into the lungs every 6 (six) hours as needed., Disp: 1 each, Rfl: 3   levalbuterol (XOPENEX) 1.25 MG/3ML nebulizer solution, USE 1 VIAL BY NEBULIZATION EVERY 6 (SIX) HOURS AS NEEDED FOR WHEEZING., Disp: 540 mL, Rfl: 1   montelukast (SINGULAIR) 10 MG tablet, TAKE 1 TABLET BY MOUTH EVERY DAY, Disp: 90 tablet, Rfl: 1    predniSONE (DELTASONE) 20 MG tablet, Take 3 tablets (60 mg total) by mouth daily with breakfast for 4 days, THEN 2.5 tablets (50 mg total) daily with breakfast for 4 days, THEN 2 tablets (40 mg total) daily with breakfast for 4 days, THEN 1.5 tablets (30 mg total) daily with breakfast for 4 days, THEN 1 tablet (20 mg total) daily with breakfast for 4 days, THEN 0.5 tablets (10 mg total) daily with breakfast for 4 days., Disp: 42 tablet, Rfl: 0   Ascorbic Acid (SUPER C COMPLEX PO), , Disp: , Rfl:    ASMANEX, 30 METERED DOSES, 220 MCG/ACT inhaler, INHALE 2 PUFFS INTO THE LUNGS TWICE A DAY, Disp: 5 each, Rfl: 0   Cholecalciferol 100 MCG (4000 UT) CAPS,  Take 1 capsule (4,000 Units total) by mouth daily., Disp: 30 capsule, Rfl: 0   dexlansoprazole (DEXILANT) 60 MG capsule, TAKE 1 CAPSULE BY MOUTH EVERY DAY, Disp: 90 capsule, Rfl: 1   EPINEPHrine (AUVI-Q) 0.3 mg/0.3 mL IJ SOAJ injection, Use as directed for severe allergic reactions, Disp: 4 each, Rfl: 1   famotidine (PEPCID) 20 MG tablet, TAKE 1 TABLET BY MOUTH TWICE A DAY (Patient not taking: Reported on 06/07/2022), Disp: 180 tablet, Rfl: 1   ferrous sulfate 325 (65 FE) MG tablet, 3 (three) times a week. , Disp: , Rfl:    Fluticasone Propionate (XHANCE) 93 MCG/ACT EXHU, Place 93 mcg into the nose 2 (two) times daily. 2 sprays twice daily, Disp: , Rfl:    hydrOXYzine (ATARAX) 25 MG tablet, Take 1 tablet (25 mg total) by mouth every 6 (six) hours as needed for anxiety (allergies)., Disp: 30 tablet, Rfl: 1   metoprolol succinate (TOPROL-XL) 100 MG 24 hr tablet, TAKE 1 TABLET BY MOUTH DAILY. TAKE WITH OR IMMEDIATELY FOLLOWING A MEAL., Disp: 90 tablet, Rfl: 1   mometasone (ASMANEX, 120 METERED DOSES,) 220 MCG/ACT inhaler, Inhale 2 puffs into the lungs 2 (two) times daily., Disp: 1 each, Rfl: 5   norethindrone (MICRONOR) 0.35 MG tablet, Take 0.35 mg by mouth., Disp: , Rfl:    ondansetron (ZOFRAN) 4 MG tablet, Take 1 tablet (4 mg total) by mouth every 8 (eight)  hours as needed for nausea or vomiting., Disp: 20 tablet, Rfl: 0   predniSONE (DELTASONE) 10 MG tablet, 2 tablets twice daily by mouth for 2 days then 1 tablet twice daily by mouth for 2 days then 1 tablet daily for 2 days then 0.5 tablet by mouth daily for 2 days then off., Disp: 15 tablet, Rfl: 0   predniSONE (DELTASONE) 10 MG tablet, 1 tablet twice a day for 3 days, Disp: 6 tablet, Rfl: 0   Prenatal Vit-Fe Fumarate-FA (MULTIVITAMIN-PRENATAL) 27-0.8 MG TABS tablet, Take 1 tablet by mouth daily at 12 noon., Disp: , Rfl:    Probiotic Product ([place]) CAPS, , Disp: , Rfl:    Garner Nash, DO [facility] 06/07/2022 2:07 PM

## 2022-06-07 NOTE — Patient Instructions (Signed)
Monitor blood pressure, pulse oximetry, and pulse at home. If BP is consistently higher than 180/90, pulse is > 130, or O2 level is < 94%, go to the emergency room.

## 2022-06-07 NOTE — Patient Instructions (Signed)
Thank you for visiting Dr. Valeta Harms at [facility]. Today we recommend the following:  Continue '10mg'$  pred with taper as prescribed by Dr. Darnell Level.  Nebs as needed   Return in about 6 months (around 12/07/2022).    Please do your part to reduce the spread of COVID-19.

## 2022-06-11 ENCOUNTER — Encounter: Payer: Self-pay | Admitting: Allergy & Immunology

## 2022-06-12 ENCOUNTER — Ambulatory Visit (INDEPENDENT_AMBULATORY_CARE_PROVIDER_SITE_OTHER): Payer: BC Managed Care – PPO | Admitting: Family Medicine

## 2022-06-12 ENCOUNTER — Encounter: Payer: Self-pay | Admitting: Family Medicine

## 2022-06-12 ENCOUNTER — Telehealth: Payer: Self-pay | Admitting: Allergy & Immunology

## 2022-06-12 VITALS — BP 134/80 | HR 107 | Temp 97.8°F | Ht 68.0 in | Wt 290.2 lb

## 2022-06-12 DIAGNOSIS — J383 Other diseases of vocal cords: Secondary | ICD-10-CM

## 2022-06-12 DIAGNOSIS — I10 Essential (primary) hypertension: Secondary | ICD-10-CM

## 2022-06-12 DIAGNOSIS — J4540 Moderate persistent asthma, uncomplicated: Secondary | ICD-10-CM

## 2022-06-12 DIAGNOSIS — F4322 Adjustment disorder with anxiety: Secondary | ICD-10-CM | POA: Diagnosis not present

## 2022-06-12 DIAGNOSIS — R5383 Other fatigue: Secondary | ICD-10-CM

## 2022-06-12 DIAGNOSIS — R0602 Shortness of breath: Secondary | ICD-10-CM

## 2022-06-12 MED ORDER — VENLAFAXINE HCL ER 37.5 MG PO CP24: 37.5000 mg | ORAL_CAPSULE | Freq: Every day | ORAL | 0 refills | Status: DC

## 2022-06-12 MED ORDER — VENLAFAXINE HCL ER 75 MG PO CP24: 75.0000 mg | ORAL_CAPSULE | Freq: Every day | ORAL | 5 refills | Status: DC

## 2022-06-12 NOTE — Progress Notes (Signed)
[facility] [address] [place] [address] Dept: [phone] Dept Fax: [phone]  Office Visit  Subjective:    Patient ID: Nicole Mendoza, female    DOB: 11-Oct-1990, 31 y.o..   MRN: [id number]  Chief Complaint  Patient presents with   Follow-up    1 week f/u.  Still having elevated BP, tachycardia.     History of Present Illness:  Patient is in today for reassessment. I had seen Ms. Erisman last week on 12/22. She had complained of increased dyspnea and wheezing. On exam, I had heard wheezing, so had recommended we increase her prednisone and taper this more slowly. However, she was able to get an appointment later that day with Dr. Valeta Harms. He noted her lungs to be clear at that point. He feels she is having issues with vocal cord dysfunction. He had her go ahead and finish the taper Dr. Ernst Bowler had started for her.  Ms. Ziebarth continues to have feelings of breathlessness and choking at times. She esp. can note this at night, which leads to her waking up suddenly and gasping for air. She also feels increased wheezing when she is up and around. Ms. Ponce has a history of asthma, allergic rhinitis, OSA, and GERD. She is on CPAP and uses this consistently. She also has fibromyalgia and is trying to remain active. She has been identified with a tachycardia issue and possibly POTS. She has been [place] extra metoprolol doses more recently. She does admit to some feelings of being overwhelmed. He husband does agree that she may have some underlying anxiety, an issue raised by Dr. Valeta Harms regarding the VCD. Ms. Mandeville is unsure if she has an anxiety issues, as she notes this was not an issue prior to her more recent health issues.  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 ([place]) 11/19/2020   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   Recurrent sinusitis 11/09/2020   Pre-nodular edema of the vocal folds 11/09/2020   Laryngospasms 11/09/2020   ASCUS of cervix with negative high risk HPV 07/10/2020   Cardiac murmur 09/02/2019   Tachycardia 09/01/2019   Salivary stone 09/01/2019   Anaphylactic shock due to adverse food reaction 01/22/2019   Seasonal and perennial allergic rhinitis 01/22/2019   Morbid obesity with BMI of 45.0-49.9, adult ([place]) 10/28/2017   Moderate persistent asthma without complication 51/76/1607   Essential hypertension 04/22/2017   Dysmenorrhea 06/26/2016   Menorrhagia with regular cycle 06/26/2016   Gastroesophageal reflux disease 12/22/2015   Vitamin D deficiency 11/02/2014   Past Surgical History:  Procedure Laterality Date   NO PAST SURGERIES     Family History  Adopted: Yes  Problem Relation Age of Onset   Asthma Mother    Diabetes Mother    High blood pressure Mother    Heart disease Mother    Bipolar disorder Mother    Sleep apnea Mother    Obesity Mother  Allergic rhinitis Father    Asthma Father    Diabetes Father    High blood pressure Father    Hyperlipidemia Father    Obesity Father    Allergic rhinitis Sister    Asthma Sister    Angioedema Neg Hx    Atopy Neg Hx    Eczema Neg Hx    Immunodeficiency Neg Hx    Urticaria Neg Hx    Outpatient Medications Prior to Visit  Medication Sig Dispense Refill   Ascorbic Acid (SUPER C COMPLEX PO)      ASMANEX, 30 METERED DOSES, 220  MCG/ACT inhaler INHALE 2 PUFFS INTO THE LUNGS TWICE A DAY 5 each 0   budesonide (PULMICORT) 0.5 MG/2ML nebulizer solution Take 2 mLs (0.5 mg total) by nebulization as needed. 60 mL 2   cetirizine (ZYRTEC) 10 MG tablet Take 10 mg by mouth daily.     Cholecalciferol 100 MCG (4000 UT) CAPS Take 1 capsule (4,000 Units total) by mouth daily. 30 capsule 0   dexlansoprazole (DEXILANT) 60 MG capsule TAKE 1 CAPSULE BY MOUTH EVERY DAY 90 capsule 1   EPINEPHrine (AUVI-Q) 0.3 mg/0.3 mL IJ SOAJ injection Use as directed for severe allergic reactions 4 each 1   famotidine (PEPCID) 20 MG tablet TAKE 1 TABLET BY MOUTH TWICE A DAY 180 tablet 1   ferrous sulfate 325 (65 FE) MG tablet 3 (three) times a week.      Fluticasone Propionate (XHANCE) 93 MCG/ACT EXHU Place 93 mcg into the nose 2 (two) times daily. 2 sprays twice daily     hydrOXYzine (ATARAX) 25 MG tablet Take 1 tablet (25 mg total) by mouth every 6 (six) hours as needed for anxiety (allergies). 30 tablet 1   ipratropium (ATROVENT) 0.02 % nebulizer solution Take 1.25 mLs (0.25 mg total) by nebulization 4 (four) times daily. Mix 0.5 vial in with levalbuterol nebulizer and can use every 6 hours as needed for shortness of breath and wheezing. 75 mL 3   levalbuterol (XOPENEX HFA) 45 MCG/ACT inhaler Inhale 2 puffs into the lungs every 6 (six) hours as needed. 1 each 3   levalbuterol (XOPENEX) 1.25 MG/3ML nebulizer solution USE 1 VIAL BY NEBULIZATION EVERY 6 (SIX) HOURS AS NEEDED FOR WHEEZING. 540 mL 1   metoprolol succinate (TOPROL-XL) 100 MG 24 hr tablet TAKE 1 TABLET BY MOUTH DAILY. TAKE WITH OR IMMEDIATELY FOLLOWING A MEAL. 90 tablet 1   mometasone (ASMANEX, 120 METERED DOSES,) 220 MCG/ACT inhaler Inhale 2 puffs into the lungs 2 (two) times daily. 1 each 5   montelukast (SINGULAIR) 10 MG tablet TAKE 1 TABLET BY MOUTH EVERY DAY 90 tablet 1   norethindrone (MICRONOR) 0.35 MG tablet Take 0.35 mg by mouth.     ondansetron (ZOFRAN) 4 MG tablet Take 1 tablet (4 mg  total) by mouth every 8 (eight) hours as needed for nausea or vomiting. 20 tablet 0   Prenatal Vit-Fe Fumarate-FA (MULTIVITAMIN-PRENATAL) 27-0.8 MG TABS tablet Take 1 tablet by mouth daily at 12 noon.     Probiotic Product ([facility]) CAPS      predniSONE (DELTASONE) 20 MG tablet Take 3 tablets (60 mg total) by mouth daily with breakfast for 4 days, THEN 2.5 tablets (50 mg total) daily with breakfast for 4 days, THEN 2 tablets (40 mg total) daily with breakfast for 4 days, THEN 1.5 tablets (30 mg total) daily with breakfast for 4 days, THEN 1 tablet (20 mg total) daily with breakfast for 4 days, THEN 0.5 tablets (  10 mg total) daily with breakfast for 4 days. (Patient not taking: Reported on 06/12/2022) 42 tablet 0   predniSONE (DELTASONE) 10 MG tablet 2 tablets twice daily by mouth for 2 days then 1 tablet twice daily by mouth for 2 days then 1 tablet daily for 2 days then 0.5 tablet by mouth daily for 2 days then off. 15 tablet 0   predniSONE (DELTASONE) 10 MG tablet 1 tablet twice a day for 3 days 6 tablet 0   No facility-administered medications prior to visit.   Allergies  Allergen Reactions   Budesonide-Formoterol Fumarate Other (See Comments)    Causes Hypertension   Other     Tree nuts   Penicillins Hives   Raspberry Hives   Spiriva Respimat [Tiotropium Bromide Monohydrate] Other (See Comments)    Pt states medication causes dizziness   Albuterol Palpitations   Losartan Rash     Objective:   Today's Vitals   06/12/22 1537  BP: 134/80  Pulse: (!) 107  Temp: 97.8 F (36.6 C)  TempSrc: Temporal  SpO2: 96%  Weight: 290 lb 3.2 oz (131.6 kg)  Height: '5\' 8"'$  (1.727 m)   Body mass index is 44.12 kg/m.   General: Well developed, well nourished. No acute distress. Lungs: Clear to auscultation bilaterally, except for a brief ronchi at the end of expiration. This is heard   best up over the trachea. No wheezing or rales. CV: RRR without murmurs or rubs. Pulses 2+  bilaterally. Psych: Alert and oriented. Normal mood and affect.  Health Maintenance Due  Topic Date Due   Hepatitis C Screening  Never done     Assessment & Plan:   1. Vocal cord dysfunction Recently diagnosed by pulmonology. They have backed off on some of the asthma treatments. Ms. Lawhorne has admitted that albuterol does not seem to help.  2. Moderate persistent asthma without complication Appears stable. As she has been on multiple rounds of prednisone,s he has had worries for a low potassium. We will check this today.  - Basic metabolic panel  3. Essential hypertension BP is improved form last visit. I am concerned that acute anxiety may be pushing this up.  - Basic metabolic panel  4. Adjustment disorder with anxiety I do feel Ms. Catoe has some underlying anxiety. We discuss treating this for 6 months and then considering a taper. This could give some time for her health issues to even out.  - venlafaxine XR (EFFEXOR XR) 37.5 MG 24 hr capsule; Take 1 capsule (37.5 mg total) by mouth daily with breakfast.  Dispense: 14 capsule; Refill: 0 - venlafaxine XR (EFFEXOR XR) 75 MG 24 hr capsule; Take 1 capsule (75 mg total) by mouth daily with breakfast. To start after two weeks at the 37.5 mg dose  Dispense: 30 capsule; Refill: 5   Return in about 6 weeks (around 07/24/2022) for Reassessment.   [name], MD

## 2022-06-12 NOTE — Telephone Encounter (Signed)
Ordered metabolic panel as well as a CBC, tryptase, and a high sensitivity c-KIT mutation analysis due to ongoing fatigue and SOB x nearly 3 weeks.  Patient aware.  Salvatore Marvel, MD [facility] [place] [facility] [place]

## 2022-06-13 ENCOUNTER — Ambulatory Visit: Payer: Self-pay | Admitting: Family Medicine

## 2022-06-13 LAB — BASIC METABOLIC PANEL
BUN: 12 mg/dL (ref 6–23)
CO2: 22 mEq/L (ref 19–32)
Calcium: 9.4 mg/dL (ref 8.4–10.5)
Chloride: 108 mEq/L (ref 96–112)
Creatinine, Ser: 0.66 mg/dL (ref 0.40–1.20)
GFR: 117.07 mL/min (ref >60.00)
Glucose, Bld: 118 mg/dL — ABNORMAL HIGH (ref 70–99)
Potassium: 3.8 mEq/L (ref 3.5–5.1)
Sodium: 140 mEq/L (ref 135–145)

## 2022-06-14 ENCOUNTER — Ambulatory Visit: Payer: BC Managed Care – PPO | Admitting: Nurse Practitioner

## 2022-06-16 NOTE — Progress Notes (Deleted)
Cardiology Office Note Date:  06/16/2022  Patient ID:  Nicole Mendoza, Nicole Mendoza 05/21/1991, MRN [id number] PCP:  [name], MD  Electrophysiologist: Dr. Caryl Comes  ***refresh   Chief Complaint: *** over due/ ?annual visit  History of Present Illness: Nicole Mendoza is a 31 y.o. female with history of HTN, obesity, ? POTS. Asthma/allergic rhinitis, OSA, GERD, OSA w/CPAP, fibromyalgia  Dr. Caryl Comes notes: exertional palpitations in the wake of prior COVID infection without an evaluation 5/22 objective evidence of orthostatic intolerance albeit in the context of high-dose metoprolol which was symptomatically beneficial   Also discussed that she has been told she can not be on OBC/birth control 2/2 her HTN  She last saw Dr. Caryl Comes 06/25/21, recent issues with dizziness, ear/sinus fullness, this dizziness he felt sounded more of vertigo not dysautonomia and recommended that she f/u with her PMD, though id provide meclizine Rx. BP and palpitations with reasonable control, maintained on metoprolol with an addition PRN dose if needed for palpitations.  Saw primary team 06/12/22 in f/u of wheezing, HTN, palpitations, she had previously having been given prednisone taper at a prior visit.  She had also seen Dr. Valeta Harms, with concern of possible vocal cord dysfunction, at his visit her lungs were clear, advised completion of the taper. They discussed +/- anxiety as a possible contributor, started on Effexor, planned for 6 week f/u  *** symptoms, triggers *** palps *** BP *** follow sx with Effexor, ?? GERD   Past Medical History:  Diagnosis Date   Allergic rhinitis    Allergy    Angio-edema    Asthma    Dysmenorrhea    Fatty liver    GERD (gastroesophageal reflux disease)    Hypertension    Recurrent upper respiratory infection (URI)    Sleep apnea    Tachycardia     Past Surgical History:  Procedure Laterality Date   NO PAST SURGERIES      Current Outpatient Medications  Medication Sig  Dispense Refill   Ascorbic Acid (SUPER C COMPLEX PO)      ASMANEX, 30 METERED DOSES, 220 MCG/ACT inhaler INHALE 2 PUFFS INTO THE LUNGS TWICE A DAY 5 each 0   budesonide (PULMICORT) 0.5 MG/2ML nebulizer solution Take 2 mLs (0.5 mg total) by nebulization as needed. 60 mL 2   cetirizine (ZYRTEC) 10 MG tablet Take 10 mg by mouth daily.     Cholecalciferol 100 MCG (4000 UT) CAPS Take 1 capsule (4,000 Units total) by mouth daily. 30 capsule 0   dexlansoprazole (DEXILANT) 60 MG capsule TAKE 1 CAPSULE BY MOUTH EVERY DAY 90 capsule 1   EPINEPHrine (AUVI-Q) 0.3 mg/0.3 mL IJ SOAJ injection Use as directed for severe allergic reactions 4 each 1   famotidine (PEPCID) 20 MG tablet TAKE 1 TABLET BY MOUTH TWICE A DAY 180 tablet 1   ferrous sulfate 325 (65 FE) MG tablet 3 (three) times a week.      Fluticasone Propionate (XHANCE) 93 MCG/ACT EXHU Place 93 mcg into the nose 2 (two) times daily. 2 sprays twice daily     hydrOXYzine (ATARAX) 25 MG tablet Take 1 tablet (25 mg total) by mouth every 6 (six) hours as needed for anxiety (allergies). 30 tablet 1   ipratropium (ATROVENT) 0.02 % nebulizer solution Take 1.25 mLs (0.25 mg total) by nebulization 4 (four) times daily. Mix 0.5 vial in with levalbuterol nebulizer and can use every 6 hours as needed for shortness of breath and wheezing. 75 mL 3  levalbuterol (XOPENEX HFA) 45 MCG/ACT inhaler Inhale 2 puffs into the lungs every 6 (six) hours as needed. 1 each 3   levalbuterol (XOPENEX) 1.25 MG/3ML nebulizer solution USE 1 VIAL BY NEBULIZATION EVERY 6 (SIX) HOURS AS NEEDED FOR WHEEZING. 540 mL 1   metoprolol succinate (TOPROL-XL) 100 MG 24 hr tablet TAKE 1 TABLET BY MOUTH DAILY. TAKE WITH OR IMMEDIATELY FOLLOWING A MEAL. 90 tablet 1   mometasone (ASMANEX, 120 METERED DOSES,) 220 MCG/ACT inhaler Inhale 2 puffs into the lungs 2 (two) times daily. 1 each 5   montelukast (SINGULAIR) 10 MG tablet TAKE 1 TABLET BY MOUTH EVERY DAY 90 tablet 1   norethindrone (MICRONOR) 0.35  MG tablet Take 0.35 mg by mouth.     ondansetron (ZOFRAN) 4 MG tablet Take 1 tablet (4 mg total) by mouth every 8 (eight) hours as needed for nausea or vomiting. 20 tablet 0   predniSONE (DELTASONE) 20 MG tablet Take 3 tablets (60 mg total) by mouth daily with breakfast for 4 days, THEN 2.5 tablets (50 mg total) daily with breakfast for 4 days, THEN 2 tablets (40 mg total) daily with breakfast for 4 days, THEN 1.5 tablets (30 mg total) daily with breakfast for 4 days, THEN 1 tablet (20 mg total) daily with breakfast for 4 days, THEN 0.5 tablets (10 mg total) daily with breakfast for 4 days. (Patient not taking: Reported on 06/12/2022) 42 tablet 0   Prenatal Vit-Fe Fumarate-FA (MULTIVITAMIN-PRENATAL) 27-0.8 MG TABS tablet Take 1 tablet by mouth daily at 12 noon.     Probiotic Product (PHILLIPS COLON HEALTH) CAPS      venlafaxine XR (EFFEXOR XR) 37.5 MG 24 hr capsule Take 1 capsule (37.5 mg total) by mouth daily with breakfast. 14 capsule 0   venlafaxine XR (EFFEXOR XR) 75 MG 24 hr capsule Take 1 capsule (75 mg total) by mouth daily with breakfast. To start after two weeks at the 37.5 mg dose 30 capsule 5   No current facility-administered medications for this visit.    Allergies:   Budesonide-formoterol fumarate, Other, Penicillins, Raspberry, Spiriva respimat [tiotropium bromide monohydrate], Albuterol, and Losartan   Social History:  The patient  reports that she has never smoked. She has never used smokeless tobacco. She reports that she does not drink alcohol and does not use drugs.   Family History:  The patient's family history includes Allergic rhinitis in her father and sister; Asthma in her father, mother, and sister; Bipolar disorder in her mother; Diabetes in her father and mother; Heart disease in her mother; High blood pressure in her father and mother; Hyperlipidemia in her father; Obesity in her father and mother; Sleep apnea in her mother. She was adopted.  ROS:  Please see the  history of present illness.    All other systems are reviewed and otherwise negative.   PHYSICAL EXAM:  VS:  There were no vitals taken for this visit. BMI: There is no height or weight on file to calculate BMI. Well nourished, well developed, in no acute distress HEENT: normocephalic, atraumatic Neck: no JVD, carotid bruits or masses Cardiac:  *** RRR; no significant murmurs, no rubs, or gallops Lungs:  *** CTA b/l, no wheezing, rhonchi or rales Abd: soft, nontender MS: no deformity or *** atrophy Ext: *** no edema Skin: warm and dry, no rash Neuro:  No gross deficits appreciated Psych: euthymic mood, full affect   EKG:  Done today and reviewed by myself shows  ***   09/2019:monitor Baseline rhythm: Sinus  Minimum heart rate: 56 BPM.  Average heart rate: 93 BPM.  Maximal heart rate 180 BPM. Atrial arrhythmia: Sinus rhythm with no significant arrhythmias Ventricular arrhythmia: None significant Conduction abnormality: None significant Symptoms: None significant Conclusion:  Normal event monitor     09/09/2019: TTE 1. Left ventricular ejection fraction, by estimation, is 60 to 65%. The  left ventricle has normal function. The left ventricle has no regional  wall motion abnormalities. Left ventricular diastolic parameters were  normal.   2. Right ventricular systolic function is normal. The right ventricular  size is normal.   3. The mitral valve is normal in structure. Trivial mitral valve  regurgitation. No evidence of mitral stenosis.   4. The aortic valve was not well visualized. Aortic valve regurgitation  is not visualized. No aortic stenosis is present.   5. The inferior vena cava is normal in size with greater than 50%  respiratory variability, suggesting right atrial pressure of 3 mmHg.   Recent Labs: 06/22/2021: Magnesium 2.0 12/26/2021: Hemoglobin 13.6; Platelets 291; TSH 2.020 03/11/2022: ALT 22 06/12/2022: BUN 12; Creatinine, Ser 0.66; Potassium 3.8; Sodium 140   12/26/2021: Cholesterol, Total 158; HDL 52; LDL Chol Calc (NIH) 90; Triglycerides 87   Estimated Creatinine Clearance: 146.4 mL/min (by C-G formula based on SCr of 0.66 mg/dL).   Wt Readings from Last 3 Encounters:  06/12/22 290 lb 3.2 oz (131.6 kg)  06/07/22 293 lb 12.8 oz (133.3 kg)  06/07/22 293 lb (132.9 kg)     Other studies reviewed: Additional studies/records reviewed today include: summarized above  ASSESSMENT AND PLAN:  Dizziness Exertional palpitations Sinus tachycardia ***  HTN ***  Asthma Vocal cord dysfunction *** her GERD?? *** anxiety C/w PMD and pulmonary teams  Disposition: F/u with ***  Current medicines are reviewed at length with the patient today.  The patient did not have any concerns regarding medicines.  Venetia Night, PA-C 06/16/2022 9:36 AM     [place] [address] [phone] (office)  [phone] (fax)

## 2022-06-18 ENCOUNTER — Ambulatory Visit: Payer: BC Managed Care – PPO | Admitting: Physician Assistant

## 2022-06-19 DIAGNOSIS — Z630 Problems in relationship with spouse or partner: Secondary | ICD-10-CM | POA: Diagnosis not present

## 2022-06-19 DIAGNOSIS — F419 Anxiety disorder, unspecified: Secondary | ICD-10-CM | POA: Diagnosis not present

## 2022-06-21 ENCOUNTER — Other Ambulatory Visit: Payer: Self-pay

## 2022-06-21 ENCOUNTER — Telehealth: Payer: Self-pay | Admitting: Allergy & Immunology

## 2022-06-21 MED ORDER — AZITHROMYCIN 500 MG PO TABS: ORAL_TABLET | ORAL | 11 refills | Status: DC

## 2022-06-21 NOTE — Telephone Encounter (Signed)
Refill for azithromycin was sent to [facility]. Pt notify.

## 2022-06-21 NOTE — Telephone Encounter (Signed)
Pt request a call back about a refill

## 2022-06-21 NOTE — Telephone Encounter (Signed)
[facility]ill sent in [facility].

## 2022-06-22 ENCOUNTER — Other Ambulatory Visit: Payer: Self-pay | Admitting: Family Medicine

## 2022-06-22 DIAGNOSIS — F4322 Adjustment disorder with anxiety: Secondary | ICD-10-CM

## 2022-07-02 ENCOUNTER — Other Ambulatory Visit: Payer: Self-pay

## 2022-07-02 ENCOUNTER — Encounter: Payer: Self-pay | Admitting: Allergy & Immunology

## 2022-07-02 ENCOUNTER — Ambulatory Visit (INDEPENDENT_AMBULATORY_CARE_PROVIDER_SITE_OTHER): Payer: BC Managed Care – PPO | Admitting: Allergy & Immunology

## 2022-07-02 VITALS — BP 130/80 | HR 105 | Temp 97.9°F | Resp 16 | Wt 292.3 lb

## 2022-07-02 DIAGNOSIS — J4540 Moderate persistent asthma, uncomplicated: Secondary | ICD-10-CM | POA: Diagnosis not present

## 2022-07-02 DIAGNOSIS — J3089 Other allergic rhinitis: Secondary | ICD-10-CM

## 2022-07-02 DIAGNOSIS — D806 Antibody deficiency with near-normal immunoglobulins or with hyperimmunoglobulinemia: Secondary | ICD-10-CM

## 2022-07-02 DIAGNOSIS — K219 Gastro-esophageal reflux disease without esophagitis: Secondary | ICD-10-CM | POA: Diagnosis not present

## 2022-07-02 DIAGNOSIS — J383 Other diseases of vocal cords: Secondary | ICD-10-CM

## 2022-07-02 MED ORDER — FAMOTIDINE 40 MG PO TABS: 40.0000 mg | ORAL_TABLET | Freq: Every day | ORAL | 2 refills | Status: AC

## 2022-07-02 MED ORDER — SUCRALFATE 1 G PO TABS: 1.0000 g | ORAL_TABLET | Freq: Three times a day (TID) | ORAL | 2 refills | Status: DC

## 2022-07-02 MED ORDER — NYSTATIN 100000 UNIT/ML MT SUSP: OROMUCOSAL | 3 refills | Status: DC

## 2022-07-02 NOTE — Patient Instructions (Addendum)
Asthma - We did a spiro and it looked perfect.  - Daily controller medication(s): Singulair '10mg'$  daily and Asmanex 1-2 puffs twice daily  - Prior to physical activity: Xopenex 2 puffs 10-15 minutes before physical activity. - Rescue medications: Xopenex 4 puffs every 4-6 hours as needed - Changes during respiratory infections or worsening symptoms: Add on Pulmicort 0.'5mg'$  to one treatment twice daily for TWO WEEKS. - Asthma control goals:  * Full participation in all desired activities (may need albuterol before activity) * Albuterol use two time or less a week on average (not counting use with activity) * Cough interfering with sleep two time or less a month * Oral steroids no more than once a year * No hospitalizations  Allergic rhinitis - Continue saline rinses several times a day - Continue cetirizine 10 mg once a day as needed for a runny nose or itch. - Continue Xhance 2 sprays in each nostril twice a day for nasal symptoms  Recurrent infections - We are working on the appeal.  - Continue your azithromycin.  Reflux - Continue dietary and lifestyle modifications as listed below - Continue with Dexilant as you are doing.  - Add on famotidine '40mg'$  nightly. - I sent in a refill of the Carafate.   Vocal cord dysfunction - Continue with speech therapy at home lessons as you are doing.  - Continue with the belly breathing exercises  Food allergy - Continue to avoid tree nuts.  In case of an allergic reaction, take Benadryl 50 mg every 4 hours, and if life-threatening symptoms occur, inject with AuviQ 0.3 mg. - We will get that mixed tree nut challenge done at some point.   Return in about 4 months (around 10/31/2022).    Please inform us of any Emergency Department visits, hospitalizations, or changes in symptoms. Call us before going to the ED for breathing or allergy symptoms since we might be able to fit you in for a sick visit. Feel free to contact us anytime with any  questions, problems, or concerns.  It was a pleasure to see you again today!  Websites that have reliable patient information: 1. American Academy of Asthma, Allergy, and Immunology: www.aaaai.org 2. Food Allergy Research and Education (FARE): foodallergy.org 3. Mothers of Asthmatics: http://www.asthmacommunitynetwork.org 4. American College of Allergy, Asthma, and Immunology: www.acaai.org   COVID-19 Vaccine Information can be found at: ShippingScam.co.uk For questions related to vaccine distribution or appointments, please email [email or web address] or call [phone].   We realize that you might be concerned about having an allergic reaction to the COVID19 vaccines. To help with that concern, WE ARE OFFERING THE COVID19 VACCINES IN OUR OFFICE! Ask the front desk for dates!     "Like" [place] on Facebook and Instagram for our latest updates!      A healthy democracy works best when [employer] participate! Make sure you are registered to vote! If you have moved or changed any of your contact information, you will need to get this updated before voting!  In some cases, you MAY be able to register to vote online: [email or web address]

## 2022-07-02 NOTE — Progress Notes (Signed)
FOLLOW UP  Date of Service/Encounter:  07/02/22   Assessment:   Mild persistent asthma with acute exacerbation with multiple adverse reactions to a number of inhalers   Vocal cord dysfunction - graduated from speech therapy    Recurrent infections - improved following Pneumovax, but then worsened (streptococcal avidity assay abnormal) - needs to start Hizentra    Seasonal and perennial allergic rhinitis (trees, weeds, grasses and dust mites) - not well controlled    Gastroesophageal reflux disease - on Dexilant and famotidine and PRN carafate    Adverse food reactions - resolved with minimizing exposure to food additives and avoiding peanuts/tree nuts   Snoring with perceived poor sleep quality - improved with use of the CPAP   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  Plan/Recommendations:   Asthma - We did a spiro and it looked perfect.  - Daily controller medication(s): Singulair '10mg'$  daily and Asmanex 1-2 puffs twice daily  - Prior to physical activity: Xopenex 2 puffs 10-15 minutes before physical activity. - Rescue medications: Xopenex 4 puffs every 4-6 hours as needed - Changes during respiratory infections or worsening symptoms: Add on Pulmicort 0.'5mg'$  to one treatment twice daily for TWO WEEKS. - Asthma control goals:  * Full participation in all desired activities (may need albuterol before activity) * Albuterol use two time or less a week on average (not counting use with activity) * Cough interfering with sleep two time or less a month * Oral steroids no more than once a year * No hospitalizations  Allergic rhinitis - Continue saline rinses several times a day - Continue cetirizine 10 mg once a day as needed for a runny nose or itch. - Continue Xhance 2 sprays in each nostril twice a day for nasal symptoms  Recurrent infections - We are working on the appeal.  - Continue your azithromycin.  Reflux - Continue dietary and lifestyle  modifications as listed below - Continue with Dexilant as you are doing.  - Add on famotidine '40mg'$  nightly. - I sent in a refill of the Carafate.   Vocal cord dysfunction - Continue with speech therapy at home lessons as you are doing.  - Continue with the belly breathing exercises  Food allergy - Continue to avoid tree nuts.  In case of an allergic reaction, take Benadryl 50 mg every 4 hours, and if life-threatening symptoms occur, inject with AuviQ 0.3 mg. - We will get that mixed tree nut challenge done at some point.   Return in about 4 months (around 10/31/2022).   Subjective:   Nicole Mendoza is a 32 y.o. 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 11/22/2020   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/19/2020   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   Recurrent sinusitis 11/09/2020   Pre-nodular edema of the vocal  folds 11/09/2020   Laryngospasms 11/09/2020   ASCUS of cervix with negative high risk HPV 07/10/2020   Cardiac murmur 09/02/2019   Tachycardia 09/01/2019   Salivary stone 09/01/2019   Anaphylactic shock due to adverse food reaction 01/22/2019   Seasonal and perennial allergic rhinitis 01/22/2019   Morbid obesity with BMI of 45.0-49.9, adult ([place]) 10/28/2017   Moderate persistent asthma without  complication 36/14/4315   Essential hypertension 04/22/2017   Dysmenorrhea 06/26/2016   Menorrhagia with regular cycle 06/26/2016   Gastroesophageal reflux disease 12/22/2015   Vitamin D deficiency 11/02/2014    History obtained from: chart review and patient.  Nicole Mendoza is a 32 y.o. female presenting for a follow up visit.  She was last seen in December 2023.  At that time, she had a spirometry that look perfect.  We weaned her off the prednisone and continued Singulair and Asmanex 1 to 2 puffs twice daily.  For her allergic rhinitis, she continued on cetirizine and Xhance.  We recommended starting her Hizentra.  We continue with prophylactic azithromycin.  She continued on pantoprazole twice daily and famotidine twice daily.  We added Carafate 3 times daily for a couple of weeks.  She continue to avoid tree nuts.  In the meantime, she went to see her PCP who got her an urgent pulmonology appointment with Dr. Valeta Harms.  He felt that all of this was related to vocal cord dysfunction.  Since last visit, she has done well.   She uses her Visitirl to get her through the panic attacks. She has not needed one in three weeks.   She has considered Cymbalta for treatment of   Asthma/Respiratory Symptom History: She is on Asmanex two puffs BID.  She feels that her breathing is under fairly good control.  She has not been using her rescue inhaler much at all.  She has not any breakthrough symptoms.  She feels that she has a better grasp of when it is her asthma versus her anxiety.  She also speaks very highly of Dr. Valeta Harms who she saw last month.  She said that he is a really good teacher and explained how extra body fat can actually restrict the lung volumes a little bit.  She knows that her anxiety goes off the rails when she is sick.  She uses Vistaril to get through her panic attacks.  She has not needed any Vistaril in nearly a month.  Currently, her PCP recommended that she start Effexor for treatment of  anxiety, but she has declined this.  She is open to trying Cymbalta for helping with her fibromyalgia attacks.  She has an appointment with [name] next week to discuss this.  She does tell me that she has done some more reading and has finally come to the conclusion that a lot of her fibromyalgia attacks are related to her infections.  Therefore, she has both me and [name] recommending that she start her immunoglobulin replacement.  She also has this new article that she read.  She is off restarting it now, but unfortunately her insurance has denied it.  She got a new insurance plan at the beginning of the year because her husband recently switched jobs and it has been difficult to get the immunoglobulin approved.  [name] is working on an appeal.  She remains on azithromycin prophylaxis.  She stopped it because she gets Candida infections in her mouth when she is on it long-term.  She is wondering if she can have some  nystatin to swish and spit whenever she has breakouts of Candida.  Currently, she uses Diflucan occasionally.  She remains on light duty at work.  She is mostly doing approval of care plans.  She has not done any intakes or home visits, but she is looking forward to getting these back as her health improves.  Allergic Rhinitis Symptom History: She remains on saline rinses as well as cetirizine.  She also has Xhance to use.  She is not sure if her current insurance covers [place], but she has a backlog of it anyway.  Food Allergy Symptom History: She continues to avoid tree nuts.  She remains interested in a food challenge.  She wants to just get everything else under control first.  GERD Symptom History: She has Dexilant.  This was recently approved by her insurance after much back and forth.  She was on famotidine twice a day, but her current insurance does not cover it.  Her reflux has been acting up over the last week.  She does not have any Carafate left.  She reports that hers is  long expired.  Otherwise, there have been no changes to her past medical history, surgical history, family history, or social history.    Review of Systems  Constitutional: Negative.  Negative for chills, fever, malaise/fatigue and weight loss.  HENT: Negative.  Negative for congestion, ear discharge, ear pain and sinus pain.   Eyes:  Negative for pain, discharge and redness.  Respiratory:  Negative for cough, sputum production, shortness of breath and wheezing.   Cardiovascular: Negative.  Negative for chest pain and palpitations.  Gastrointestinal:  Negative for abdominal pain, constipation, diarrhea, heartburn, nausea and vomiting.  Skin: Negative.  Negative for itching and rash.  Neurological:  Negative for dizziness and headaches.  Endo/Heme/Allergies:  Negative for environmental allergies. Does not bruise/bleed easily.       Objective:   Blood pressure 130/80, pulse (!) 105, temperature 97.9 F (36.6 C), temperature source Oral, resp. rate 16, weight 292 lb 4.8 oz (132.6 kg), SpO2 98 %. Body mass index is 44.44 kg/m.    Physical Exam Vitals reviewed.  Constitutional:      Appearance: She is well-developed. She is obese.     Comments: Talkative as always.   HENT:     Head: Normocephalic and atraumatic.     Right Ear: Tympanic membrane, ear canal and external ear normal.     Left Ear: Tympanic membrane, ear canal and external ear normal.     Nose: No nasal deformity, septal deviation, mucosal edema or rhinorrhea.     Right Turbinates: Enlarged, swollen and pale.     Left Turbinates: Enlarged, swollen and pale.     Right Sinus: No maxillary sinus tenderness or frontal sinus tenderness.     Left Sinus: No maxillary sinus tenderness or frontal sinus tenderness.     Mouth/Throat:     Mouth: Mucous membranes are not pale and not dry.     Pharynx: Uvula midline. No pharyngeal swelling or oropharyngeal exudate.     Tonsils: 2+ on the right. 2+ on the left.  Eyes:      General: Lids are normal. No allergic shiner.       Right eye: No discharge.        Left eye: No discharge.     Conjunctiva/sclera:     Right eye: Right conjunctiva is not injected. No chemosis.    Left eye: Left conjunctiva is not injected. No chemosis.  Pupils: Pupils are equal, round, and reactive to light.  Cardiovascular:     Rate and Rhythm: Normal rate and regular rhythm.     Heart sounds: Normal heart sounds.  Pulmonary:     Effort: Pulmonary effort is normal. No tachypnea, accessory muscle usage or respiratory distress.     Breath sounds: Normal breath sounds. No wheezing, rhonchi or rales.     Comments: Moving air well in all lung fields.  No increased work of breathing. Chest:     Chest wall: No tenderness.  Lymphadenopathy:     Cervical: No cervical adenopathy.  Skin:    General: Skin is warm.     Capillary Refill: Capillary refill takes less than 2 seconds.     Coloration: Skin is not pale.     Findings: No abrasion, erythema, petechiae or rash. Rash is not papular, urticarial or vesicular.     Comments: No eczematous or urticarial lesions noted.  Neurological:     Mental Status: She is alert.  Psychiatric:        Behavior: Behavior is cooperative.      Diagnostic studies:    Spirometry: results abnormal (FEV1: 2.63/73%, FVC: 3.24/76%, FEV1/FVC: 81%).    Spirometry consistent with possible restrictive disease.   Allergy Studies: none       Salvatore Marvel, MD  [facility]

## 2022-07-03 DIAGNOSIS — F419 Anxiety disorder, unspecified: Secondary | ICD-10-CM | POA: Diagnosis not present

## 2022-07-03 DIAGNOSIS — G4733 Obstructive sleep apnea (adult) (pediatric): Secondary | ICD-10-CM | POA: Diagnosis not present

## 2022-07-03 DIAGNOSIS — Z630 Problems in relationship with spouse or partner: Secondary | ICD-10-CM | POA: Diagnosis not present

## 2022-07-07 NOTE — Progress Notes (Unsigned)
PCP:  [name], MD Primary Cardiologist: None Electrophysiologist: Virl Axe, MD   Nicole Mendoza is a 32 y.o. female seen today for Virl Axe, MD for routine electrophysiology followup.   Last seen 06/2021. High dose metoprolol was helpful with her symptoms. Did have episode dizziness felt possible Sinus/inner ear issue. Given meclizine.  Since last being seen in our clinic the patient reports doing about the same overall. She had the flu several weeks ago and per usual, had exacerbation of her symptoms. Doing well after recovering. Hasn't needed shower chair in the past week, overall tolerates hot showers well. On OCP for worsening symptoms around her menstrual cycle. Watching fluids but knows she "doesn't get as much as she should" sometimes. She uses compression and prn toprol as needed with good results. Limited salt intake re: HTN.   Past Medical History:  Diagnosis Date   Allergic rhinitis    Allergy    Angio-edema    Asthma    Dysmenorrhea    Fatty liver    GERD (gastroesophageal reflux disease)    Hypertension    Recurrent upper respiratory infection (URI)    Sleep apnea    Tachycardia     Current Outpatient Medications  Medication Instructions   Ascorbic Acid (SUPER C COMPLEX PO) No dose, route, or frequency recorded.   ASMANEX, 30 METERED DOSES, 220 MCG/ACT inhaler INHALE 2 PUFFS INTO THE LUNGS TWICE A DAY   azithromycin (ZITHROMAX) 500 MG tablet TAKE 1 TABLET (500 MG TOTAL) BY MOUTH EVERY MONDAY, WEDNESDAY, AND FRIDAY.   budesonide (PULMICORT) 0.5 mg, Nebulization, As needed   cetirizine (ZYRTEC) 10 mg, Oral, Daily   Cholecalciferol 4,000 Units, Oral, Daily   dexlansoprazole (DEXILANT) 60 mg, Oral, Daily   EPINEPHrine (AUVI-Q) 0.3 mg/0.3 mL IJ SOAJ injection Use as directed for severe allergic reactions   famotidine (PEPCID) 40 mg, Oral, Daily   ferrous sulfate 325 (65 FE) MG tablet 3 times weekly   hydrOXYzine (ATARAX) 25 mg, Oral, Every 6 hours PRN    ipratropium (ATROVENT) 0.25 mg, Nebulization, 4 times daily, Mix 0.5 vial in with levalbuterol nebulizer and can use every 6 hours as needed for shortness of breath and wheezing.   levalbuterol (XOPENEX HFA) 45 MCG/ACT inhaler 2 puffs, Inhalation, Every 6 hours PRN   levalbuterol (XOPENEX) 1.25 MG/3ML nebulizer solution USE 1 VIAL BY NEBULIZATION EVERY 6 (SIX) HOURS AS NEEDED FOR WHEEZING.   metoprolol succinate (TOPROL-XL) 100 mg, Oral, Daily, TAKE WITH OR IMMEDIATELY FOLLOWING A MEAL.   metoprolol tartrate (LOPRESSOR) 50 mg, Oral, As needed   mometasone (ASMANEX, 120 METERED DOSES,) 220 MCG/ACT inhaler 2 puffs, Inhalation, 2 times daily   montelukast (SINGULAIR) 10 mg, Oral, Daily   norethindrone (MICRONOR) 0.35 mg, Oral   nystatin (MYCOSTATIN) 100000 UNIT/ML suspension Use a few times per week to prevent thrush.   ondansetron (ZOFRAN) 4 mg, Oral, Every 8 hours PRN   Prenatal Vit-Fe Fumarate-FA (MULTIVITAMIN-PRENATAL) 27-0.8 MG TABS tablet 1 tablet, Oral, Daily   Probiotic Product ([place]) CAPS No dose, route, or frequency recorded.   sucralfate (CARAFATE) 1 g, Oral, 3 times daily with meals & bedtime   Xhance 93 mcg, Nasal, 2 times daily, 2 sprays twice daily    Physical Exam: Vitals:   07/10/22 1010  Pulse: 97  SpO2: 99%  Weight: 292 lb (132.5 kg)  Height: '5\' 8"'$  (1.727 m)   Orthostatic VS for the past 24 hrs (Last 3 readings):  BP- Lying Pulse- Lying BP- Sitting  Pulse- Sitting BP- Standing at 0 minutes Pulse- Standing at 0 minutes BP- Standing at 3 minutes Pulse- Standing at 3 minutes  07/10/22 1011 148/90 99 123/83 93 126/85 90 142/89 99    GEN- NAD. A&O x 3. Normal affect. HEENT: normocephalic, atraumatic Lungs- CTAB, Normal effort Heart- Slightly tachy, egular rate and rhythm, No M/G/R Extremities- No peripheral edema. no clubbing or cyanosis; Skin- warm and dry, no rash or lesion  EKG is ordered today. Personal review shows Sinus tachycardia at 102  bpm  Additional studies reviewed include: Previous EP notes.   Assessment and Plan:  1. Exertional tachypalpitations 2. HTN We discussed the role of salt and water repletion, the importance of exercise, often needing to be started in the recumbent position, and the awareness of triggers and the role of ambient heat and dehydration.  Salt supplements are best used with adjunctive sugar Continue toprol and add prn lopressor as she has had good results with this.   Compression wears discussed as should include thigh compression at the least.   She understands exacerbations are common and likely in setting of illness, especially febrile or when hydration may be compromised.   3. Morbid Obesity Encouraged increased activity and weight loss gradually as tolerated  Follow up with Dr. Caryl Comes in 12 months, sooner with issues.   Shirley Friar, PA-C  07/10/22 10:32 AM

## 2022-07-10 ENCOUNTER — Encounter: Payer: Self-pay | Admitting: Student

## 2022-07-10 ENCOUNTER — Ambulatory Visit: Payer: BC Managed Care – PPO | Attending: Physician Assistant | Admitting: Student

## 2022-07-10 VITALS — HR 97 | Ht 68.0 in | Wt 292.0 lb

## 2022-07-10 DIAGNOSIS — R000 Tachycardia, unspecified: Secondary | ICD-10-CM | POA: Diagnosis not present

## 2022-07-10 DIAGNOSIS — R002 Palpitations: Secondary | ICD-10-CM | POA: Diagnosis not present

## 2022-07-10 DIAGNOSIS — I10 Essential (primary) hypertension: Secondary | ICD-10-CM | POA: Diagnosis not present

## 2022-07-10 MED ORDER — METOPROLOL TARTRATE 50 MG PO TABS: 50.0000 mg | ORAL_TABLET | ORAL | 3 refills | Status: DC | PRN

## 2022-07-10 NOTE — Patient Instructions (Signed)
Medication Instructions:   START Lopressor one (1) tablet by mouth ( 50 mg) as needed for palpitations.  *If you need a refill on your cardiac medications before your next appointment, please call your pharmacy*   Lab Work:  None ordered.  If you have labs (blood work) drawn today and your tests are completely normal, you will receive your results only by: [place] (if you have MyChart) OR A paper copy in the mail If you have any lab test that is abnormal or we need to change your treatment, we will call you to review the results.   Testing/Procedures:  None ordered.   Follow-Up: At [facility], you and your health needs are our priority.  As part of our continuing mission to provide you with exceptional heart care, we have created designated Provider Care Teams.  These Care Teams include your primary Cardiologist (physician) and Advanced Practice Providers (APPs -  Physician Assistants and Nurse Practitioners) who all work together to provide you with the care you need, when you need it.  We recommend signing up for the patient portal called "MyChart".  Sign up information is provided on this After Visit Summary.  MyChart is used to connect with patients for Virtual Visits (Telemedicine).  Patients are able to view lab/test results, encounter notes, upcoming appointments, etc.  Non-urgent messages can be sent to your provider as well.   To learn more about what you can do with MyChart, go to [email or web address].    Your next appointment:   1 year(s)  Provider:   Virl Axe, MD    Other Instructions  Your physician wants you to follow-up in: 1 year with Dr. Caryl Comes.  You will receive a reminder letter in the mail two months in advance. If you don't receive a letter, please call our office to schedule the follow-up appointment.

## 2022-07-11 ENCOUNTER — Ambulatory Visit: Payer: BC Managed Care – PPO | Admitting: Internal Medicine

## 2022-07-11 NOTE — Telephone Encounter (Signed)
Spoke to patient and advised her per [place] she will have to do IVIG and she also advised current ins terminated as of today but will be getting new ins [employer] [place] and call with info when active

## 2022-07-19 ENCOUNTER — Other Ambulatory Visit: Payer: Self-pay | Admitting: Allergy

## 2022-07-19 MED ORDER — NIRMATRELVIR/RITONAVIR (PAXLOVID)TABLET: 3.0000 | ORAL_TABLET | Freq: Two times a day (BID) | ORAL | 0 refills | Status: AC

## 2022-07-19 NOTE — Progress Notes (Signed)
Called by pt.   She states she just tested positive for covid.  Started with sore throat, chills, diarrhea.  She states her lung started burning tonight and that is what made her test.  Her family has been sick this week as well.   She states she is getting ready to start IVIG.  She states she took azithromycin today.  She has some prednisone at home (she has '20mg'$  tabs at home).   Advised would take prednisone '20mg'$  bid for next several days.  Also discussed paxlovid as she is quite susceptible to severe disease with her history of asthma and immunodeficiency.  She has normal gfr.  Sent paxlovid to pharmacy.  She will use xopenex as needed and continue her routine medications.  Will forward message to Dr Ernst Bowler.

## 2022-07-22 ENCOUNTER — Telehealth: Payer: Self-pay | Admitting: Family Medicine

## 2022-07-22 NOTE — Telephone Encounter (Signed)
Pt stated she have covid and would like to know when should she schedule another f/u appointment. Please call the pt.

## 2022-07-22 NOTE — Telephone Encounter (Signed)
Spoke to patient, she states that she never started taking the Venlafaxine.  When does she need a f/u appt? Please review and advise.  Thanks.  Dm/cma

## 2022-07-23 NOTE — Telephone Encounter (Signed)
Appointment scheduled. Dm/cma

## 2022-07-24 ENCOUNTER — Ambulatory Visit: Payer: BC Managed Care – PPO | Admitting: Family Medicine

## 2022-07-24 DIAGNOSIS — Z630 Problems in relationship with spouse or partner: Secondary | ICD-10-CM | POA: Diagnosis not present

## 2022-07-24 DIAGNOSIS — F419 Anxiety disorder, unspecified: Secondary | ICD-10-CM | POA: Diagnosis not present

## 2022-07-24 MED ORDER — CEFDINIR 300 MG PO CAPS: 300.0000 mg | ORAL_CAPSULE | Freq: Two times a day (BID) | ORAL | 0 refills | Status: AC

## 2022-07-24 NOTE — Progress Notes (Signed)
Patient contacted me. She is now 6 days into her COVID19 infection. She is continuing to have some nasal congestion and sinus pressure. I am sending in cefdinir BID for 7 days. Confirmed pharmacy with the patient.   Salvatore Marvel, MD [facility]

## 2022-07-24 NOTE — Addendum Note (Signed)
Addended by: Valentina Shaggy on: 07/24/2022 08:13 AM   Modules accepted: Orders

## 2022-08-05 ENCOUNTER — Encounter: Payer: Self-pay | Admitting: Family Medicine

## 2022-08-05 DIAGNOSIS — H1030 Unspecified acute conjunctivitis, unspecified eye: Secondary | ICD-10-CM | POA: Diagnosis not present

## 2022-08-12 ENCOUNTER — Ambulatory Visit: Payer: BC Managed Care – PPO | Admitting: Internal Medicine

## 2022-08-19 DIAGNOSIS — F419 Anxiety disorder, unspecified: Secondary | ICD-10-CM | POA: Diagnosis not present

## 2022-08-28 DIAGNOSIS — F419 Anxiety disorder, unspecified: Secondary | ICD-10-CM | POA: Diagnosis not present

## 2022-09-09 ENCOUNTER — Ambulatory Visit: Payer: BC Managed Care – PPO | Admitting: Family Medicine

## 2022-09-09 VITALS — BP 130/78 | HR 91 | Temp 98.4°F | Ht 68.0 in | Wt 300.4 lb

## 2022-09-09 DIAGNOSIS — J4540 Moderate persistent asthma, uncomplicated: Secondary | ICD-10-CM

## 2022-09-09 DIAGNOSIS — J383 Other diseases of vocal cords: Secondary | ICD-10-CM | POA: Diagnosis not present

## 2022-09-09 DIAGNOSIS — I10 Essential (primary) hypertension: Secondary | ICD-10-CM | POA: Diagnosis not present

## 2022-09-09 NOTE — Assessment & Plan Note (Signed)
Blood pressure is in good control. Continue metoprolol succinate 100 mg daily.

## 2022-09-09 NOTE — Assessment & Plan Note (Signed)
Improved control of this with awareness and relaxation techniques.

## 2022-09-09 NOTE — Assessment & Plan Note (Signed)
Well-controlled. Continue montelukast 10 mg daily, levalbuterol PRN, Pulmicort daily.

## 2022-09-09 NOTE — Progress Notes (Signed)
[facility] [address] [place] [address] Dept: [phone] Dept Fax: [phone]  Chronic Care Office Visit  Subjective:    Patient ID: Nicole Mendoza, female    DOB: 1990-10-05, 32 y.o..   MRN: [id number]  Chief Complaint  Patient presents with   Medical Management of Chronic Issues    F/u meds. No concerns.     History of Present Illness:  Patient is in today for reassessment of chronic medical issues.  Ms. Nicole Mendoza has a history of hypertension. 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  She notes that cardiology also recently prescribed metoprolol tartrate 50 mg for her to use PRN when she is having tachycardic episodes.   Ms. Nicole Mendoza has moderate persistent asthma and does see Dr. Ernst Bowler (allergist) related to this. She is treated with a combination of therapies for her allergies and asthma, including Xopenox (inhaler and nebs), Asmanex, and Singulair. She also occasionally uses a budesonide neb. For her allergies, she also uses cetirizine, and a fluticasone nasal spray Truett Perna). She has PRN hydroxyzine for itching and occasioanl anxiety. She feels like her respiratory/allergy issues are stable currently. She was identified this winter as having some vocal cord dysfunciton. She feels she is getting better at recognizing these episodes and managing then with relaxation techniques. Dr. Ernst Bowler has recommended she take Hizentra (immune globulin), and she ahs been approved for monthly infusions. She hopes to start these soon.   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   Recurrent sinusitis 11/09/2020   Pre-nodular edema of the vocal folds 11/09/2020   ASCUS of cervix with negative high risk HPV 07/10/2020   Cardiac murmur 09/02/2019   Tachycardia 09/01/2019   Salivary stone 09/01/2019   Anaphylactic shock due to adverse food reaction 01/22/2019   Seasonal and perennial allergic rhinitis 01/22/2019   Morbid obesity with BMI of 45.0-49.9, adult ([place]) 10/28/2017   Moderate persistent asthma without complication 0000000   Essential hypertension 04/22/2017   Dysmenorrhea 06/26/2016   Menorrhagia with regular cycle 06/26/2016   Gastroesophageal reflux disease 12/22/2015   Vitamin D deficiency 11/02/2014   Past Surgical History:  Procedure Laterality Date   NO PAST SURGERIES     Family History  Adopted: Yes  Problem Relation Age of Onset   Asthma Mother    Diabetes Mother    High blood pressure Mother    Heart disease Mother    Bipolar disorder Mother    Sleep apnea Mother    Obesity Mother    Allergic rhinitis Father    Asthma Father    Diabetes Father    High blood pressure Father    Hyperlipidemia Father    Obesity Father    Allergic rhinitis Sister    Asthma Sister    Angioedema Neg Hx  Atopy Neg Hx    Eczema Neg Hx    Immunodeficiency Neg Hx    Urticaria Neg Hx    Outpatient Medications Prior to Visit  Medication Sig Dispense Refill   Ascorbic Acid (SUPER C COMPLEX PO)      ASMANEX, 30 METERED DOSES, 220 MCG/ACT inhaler INHALE 2 PUFFS INTO THE LUNGS TWICE A DAY 5 each 0   azithromycin (ZITHROMAX) 500 MG tablet TAKE 1 TABLET (500 MG TOTAL) BY MOUTH EVERY  MONDAY, WEDNESDAY, AND FRIDAY. 12 tablet 11   budesonide (PULMICORT) 0.5 MG/2ML nebulizer solution Take 2 mLs (0.5 mg total) by nebulization as needed. 60 mL 2   cetirizine (ZYRTEC) 10 MG tablet Take 10 mg by mouth daily.     Cholecalciferol 100 MCG (4000 UT) CAPS Take 1 capsule (4,000 Units total) by mouth daily. 30 capsule 0   dexlansoprazole (DEXILANT) 60 MG capsule TAKE 1 CAPSULE BY MOUTH EVERY DAY 90 capsule 1   EPINEPHrine (AUVI-Q) 0.3 mg/0.3 mL IJ SOAJ injection Use as directed for severe allergic reactions 4 each 1   ferrous sulfate 325 (65 FE) MG tablet 3 (three) times a week.      Fluticasone Propionate (XHANCE) 93 MCG/ACT EXHU Place 93 mcg into the nose 2 (two) times daily. 2 sprays twice daily     hydrOXYzine (ATARAX) 25 MG tablet Take 1 tablet (25 mg total) by mouth every 6 (six) hours as needed for anxiety (allergies). 30 tablet 1   ipratropium (ATROVENT) 0.02 % nebulizer solution Take 1.25 mLs (0.25 mg total) by nebulization 4 (four) times daily. Mix 0.5 vial in with levalbuterol nebulizer and can use every 6 hours as needed for shortness of breath and wheezing. 75 mL 3   levalbuterol (XOPENEX HFA) 45 MCG/ACT inhaler Inhale 2 puffs into the lungs every 6 (six) hours as needed. 1 each 3   levalbuterol (XOPENEX) 1.25 MG/3ML nebulizer solution USE 1 VIAL BY NEBULIZATION EVERY 6 (SIX) HOURS AS NEEDED FOR WHEEZING. 540 mL 1   metoprolol succinate (TOPROL-XL) 100 MG 24 hr tablet TAKE 1 TABLET BY MOUTH DAILY. TAKE WITH OR IMMEDIATELY FOLLOWING A MEAL. 90 tablet 1   metoprolol tartrate (LOPRESSOR) 50 MG tablet Take 1 tablet (50 mg total) by mouth as needed (for palpitations). 30 tablet 3   mometasone (ASMANEX, 120 METERED DOSES,) 220 MCG/ACT inhaler Inhale 2 puffs into the lungs 2 (two) times daily. 1 each 5   montelukast (SINGULAIR) 10 MG tablet TAKE 1 TABLET BY MOUTH EVERY DAY 90 tablet 1   norethindrone (MICRONOR) 0.35 MG tablet Take 0.35 mg by mouth.     ondansetron (ZOFRAN) 4 MG tablet  Take 1 tablet (4 mg total) by mouth every 8 (eight) hours as needed for nausea or vomiting. 20 tablet 0   Prenatal Vit-Fe Fumarate-FA (MULTIVITAMIN-PRENATAL) 27-0.8 MG TABS tablet Take 1 tablet by mouth daily at 12 noon.     Probiotic Product ([facility]) CAPS      sucralfate (CARAFATE) 1 g tablet Take 1 tablet (1 g total) by mouth 4 (four) times daily -  with meals and at bedtime. 120 tablet 2   famotidine (PEPCID) 40 MG tablet Take 1 tablet (40 mg total) by mouth daily. (Patient not taking: Reported on 09/09/2022) 90 tablet 2   nystatin (MYCOSTATIN) 100000 UNIT/ML suspension Use a few times per week to prevent thrush. 60 mL 3   No facility-administered medications prior to visit.   Allergies  Allergen Reactions   Budesonide-Formoterol Fumarate Other (See Comments)  Causes Hypertension   Other     Tree nuts   Penicillins Hives   Raspberry Hives   Spiriva Respimat [Tiotropium Bromide Monohydrate] Other (See Comments)    Pt states medication causes dizziness   Albuterol Palpitations   Losartan Rash   Objective:   Today's Vitals   09/09/22 0829  BP: 130/78  Pulse: 91  Temp: 98.4 F (36.9 C)  TempSrc: Temporal  SpO2: 99%  Weight: (!) 300 lb 6.4 oz (136.3 kg)  Height: 5\' 8"  (1.727 m)   Body mass index is 45.68 kg/m.   General: Well developed, well nourished. No acute distress. Lungs: Clear to auscultation bilaterally. No wheezing, rales or rhonchi. Psych: Alert and oriented. Normal mood and affect.  Health Maintenance Due  Topic Date Due   Hepatitis C Screening  Never done     Assessment & Plan:   Problem List Items Addressed This Visit       Cardiovascular and Mediastinum   Essential hypertension - Primary    Blood pressure is in good control. Continue metoprolol succinate 100 mg daily.        Respiratory   Moderate persistent asthma without complication    Well-controlled. Continue montelukast 10 mg daily, levalbuterol PRN, Pulmicort daily.         Other   Vocal cord dysfunction    Improved control of this with awareness and relaxation techniques.       Return in about 3 months (around 12/10/2022) for Reassessment.   [name], MD

## 2022-09-16 ENCOUNTER — Other Ambulatory Visit: Payer: Self-pay

## 2022-09-16 MED ORDER — XHANCE 93 MCG/ACT NA EXHU: 93.0000 ug | INHALANT_SUSPENSION | Freq: Two times a day (BID) | NASAL | 1 refills | Status: DC

## 2022-09-19 DIAGNOSIS — D806 Antibody deficiency with near-normal immunoglobulins or with hyperimmunoglobulinemia: Secondary | ICD-10-CM | POA: Diagnosis not present

## 2022-09-26 ENCOUNTER — Telehealth: Payer: Self-pay | Admitting: Allergy & Immunology

## 2022-09-26 MED ORDER — CEFDINIR 300 MG PO CAPS: 300.0000 mg | ORAL_CAPSULE | Freq: Two times a day (BID) | ORAL | 0 refills | Status: AC

## 2022-09-26 NOTE — Telephone Encounter (Signed)
Patient reported sinus symptoms including cough and congestion. I am sending in a round of cefdinir.    Nicole Bonds, MD [facility]

## 2022-09-27 ENCOUNTER — Encounter: Payer: Self-pay | Admitting: Internal Medicine

## 2022-09-27 ENCOUNTER — Ambulatory Visit (INDEPENDENT_AMBULATORY_CARE_PROVIDER_SITE_OTHER): Payer: BC Managed Care – PPO | Admitting: Internal Medicine

## 2022-09-27 VITALS — BP 130/78 | HR 99 | Temp 97.9°F | Resp 18

## 2022-09-27 DIAGNOSIS — G4733 Obstructive sleep apnea (adult) (pediatric): Secondary | ICD-10-CM | POA: Diagnosis not present

## 2022-09-27 DIAGNOSIS — J3089 Other allergic rhinitis: Secondary | ICD-10-CM

## 2022-09-27 DIAGNOSIS — J302 Other seasonal allergic rhinitis: Secondary | ICD-10-CM

## 2022-09-27 DIAGNOSIS — J069 Acute upper respiratory infection, unspecified: Secondary | ICD-10-CM

## 2022-09-27 DIAGNOSIS — J4540 Moderate persistent asthma, uncomplicated: Secondary | ICD-10-CM | POA: Diagnosis not present

## 2022-09-27 DIAGNOSIS — J329 Chronic sinusitis, unspecified: Secondary | ICD-10-CM

## 2022-09-27 DIAGNOSIS — K219 Gastro-esophageal reflux disease without esophagitis: Secondary | ICD-10-CM

## 2022-09-27 MED ORDER — FLUTICASONE PROPIONATE 50 MCG/ACT NA SUSP: 2.0000 | Freq: Every day | NASAL | 5 refills | Status: DC

## 2022-09-27 NOTE — Progress Notes (Signed)
FOLLOW UP Date of Service/Encounter:  09/27/22   Subjective:  Nicole Mendoza (DOB: Nov 15, 1990) is a 32 y.o. female [facility] in re-evaluation of the following: acute flare of asthma History obtained from: chart review and patient.  For Review, LV was on 07/02/22  with Dr. Dellis Anes seen for routine follow-up. See below for summary of history and diagnostics.  Therapeutic plans/changes recommended: Plan to start home infusions of immunoglobulin replacement. Famotidine 40 mg nightly added for reflux and Carafate refill sent.  Today presents for an acute visit due to concern for asthma flare. On review of records, she called reporting sinus symptoms including cough and congestion; sent in cefdinir yesterday.    Yesterday started with drainage, sore throat, now settled in her chest causing difficulty breathing. This morning she took a few puffs of her inhaler and mucinex and it helped significantly with her breathing.   She was able to cough up a significant amount of mucus which was mostly clear but did have some yellow tinge.  She has full upper chest tightness which was relieved by this intervention.  She is unsure if upper chest tightness is secondary to asthma or from her vocal cord dysfunction.  She would like to avoid prednisone if not necessary for which she is requesting evaluation.  She did start her cefdinir this morning.   She took azithromycin last night, but can not tolerate this medication longer because it is causing her to have watery diarrhea.  She is going to be starting Prevagen which was the only immunoglobulin replacement therapy approved for her.  She will do her first infusion at home infusion center, and then will start doing home infusions if that goes well.  She has been under a significant amount of stress as her oldest son recently had surgery for circumcision and penile tear repair.  Additionally, her younger son has  been dealing with nonstop ear infections and was recently wheezing requiring nebulizer treatments. She is also having significant amounts of autonomic dysfunction and tachycardia.  She is using her Asmanex twice daily as prescribed.  She does not like using Pulmicort as she feels it makes her chest tighter/tachycardia.  She questions whether she could try doing a half dose of Pulmicort to see if this would improve the symptoms.  We discussed increasing therapy to triple therapy during asthma flares, but she historically has not tolerated LABA's due to significant tachycardia in the past.   Allergies as of 09/27/2022       Reactions   Budesonide-formoterol Fumarate Other (See Comments)   Causes Hypertension   Other    Tree nuts   Penicillins Hives   Raspberry Hives   Spiriva Respimat [tiotropium Bromide Monohydrate] Other (See Comments)   Pt states medication causes dizziness   Albuterol Palpitations   Losartan Rash        Medication List        Accurate as of September 27, 2022  3:41 PM. If you have any questions, ask your nurse or doctor.          Asmanex (30 Metered Doses) 220 MCG/ACT inhaler Generic drug: mometasone INHALE 2 PUFFS INTO THE LUNGS TWICE A DAY   Asmanex (120 Metered Doses) 220 MCG/ACT inhaler Generic drug: mometasone Inhale 2 puffs into the lungs 2 (two) times daily.   azithromycin 500 MG tablet Commonly known as: ZITHROMAX TAKE 1 TABLET (500 MG TOTAL) BY MOUTH EVERY MONDAY, WEDNESDAY, AND FRIDAY.  budesonide 0.5 MG/2ML nebulizer solution Commonly known as: PULMICORT Take 2 mLs (0.5 mg total) by nebulization as needed.   cefdinir 300 MG capsule Commonly known as: OMNICEF Take 1 capsule (300 mg total) by mouth 2 (two) times daily for 10 days.   cetirizine 10 MG tablet Commonly known as: ZYRTEC Take 10 mg by mouth daily.   Cholecalciferol 100 MCG (4000 UT) Caps Take 1 capsule (4,000 Units total) by mouth daily.   dexlansoprazole 60 MG  capsule Commonly known as: DEXILANT TAKE 1 CAPSULE BY MOUTH EVERY DAY   EPINEPHrine 0.3 mg/0.3 mL Soaj injection Commonly known as: Auvi-Q Use as directed for severe allergic reactions   famotidine 40 MG tablet Commonly known as: Pepcid Take 1 tablet (40 mg total) by mouth daily.   ferrous sulfate 325 (65 FE) MG tablet 3 (three) times a week.   hydrOXYzine 25 MG tablet Commonly known as: ATARAX Take 1 tablet (25 mg total) by mouth every 6 (six) hours as needed for anxiety (allergies).   ipratropium 0.02 % nebulizer solution Commonly known as: ATROVENT Take 1.25 mLs (0.25 mg total) by nebulization 4 (four) times daily. Mix 0.5 vial in with levalbuterol nebulizer and can use every 6 hours as needed for shortness of breath and wheezing.   levalbuterol 1.25 MG/3ML nebulizer solution Commonly known as: XOPENEX USE 1 VIAL BY NEBULIZATION EVERY 6 (SIX) HOURS AS NEEDED FOR WHEEZING.   levalbuterol 45 MCG/ACT inhaler Commonly known as: XOPENEX HFA Inhale 2 puffs into the lungs every 6 (six) hours as needed.   metoprolol succinate 100 MG 24 hr tablet Commonly known as: TOPROL-XL TAKE 1 TABLET BY MOUTH DAILY. TAKE WITH OR IMMEDIATELY FOLLOWING A MEAL.   metoprolol tartrate 50 MG tablet Commonly known as: LOPRESSOR Take 1 tablet (50 mg total) by mouth as needed (for palpitations).   montelukast 10 MG tablet Commonly known as: SINGULAIR TAKE 1 TABLET BY MOUTH EVERY DAY   multivitamin-prenatal 27-0.8 MG Tabs tablet Take 1 tablet by mouth daily at 12 noon.   norethindrone 0.35 MG tablet Commonly known as: MICRONOR Take 0.35 mg by mouth.   ondansetron 4 MG tablet Commonly known as: Zofran Take 1 tablet (4 mg total) by mouth every 8 (eight) hours as needed for nausea or vomiting.   Phillips Colon Health Caps   sucralfate 1 g tablet Commonly known as: Carafate Take 1 tablet (1 g total) by mouth 4 (four) times daily -  with meals and at bedtime.   SUPER C COMPLEX PO    Xhance 93 MCG/ACT Exhu Generic drug: Fluticasone Propionate Place 93 mcg into the nose 2 (two) times daily. 2 sprays twice daily       Past Medical History:  Diagnosis Date   Allergic rhinitis    Allergy    Angio-edema    Asthma    Dysmenorrhea    Fatty liver    GERD (gastroesophageal reflux disease)    Hypertension    Recurrent upper respiratory infection (URI)    Sleep apnea    Tachycardia    Past Surgical History:  Procedure Laterality Date   NO PAST SURGERIES     Otherwise, there have been no changes to her past medical history, surgical history, family history, or social history.  ROS: All others negative except as noted per HPI.   Objective:  BP 130/78   Pulse 99   Temp 97.9 F (36.6 C) (Temporal)   Resp 18   SpO2 99%  There is no height or  weight on file to calculate BMI. Physical Exam: General Appearance:  Alert, cooperative, no distress, appears stated age  Head:  Normocephalic, without obvious abnormality, atraumatic  Eyes:  Conjunctiva clear, EOM's intact  Nose: Nares normal, hypertrophic turbinates, normal mucosa, and no visible anterior polyps  Throat: Lips, tongue normal; teeth and gums normal,  erythematous posterior oropharynx, tonsils 2+, no tonsillar exudate, and + cobblestoning  Neck: Supple, symmetrical  Lungs:   Moving adequate air and clear to auscultation bilaterally, Respirations unlabored, no coughing  Heart:  regular rate and rhythm and no murmur, Appears well perfused  Extremities: No edema  Skin: Skin color, texture, turgor normal and no rashes or lesions on visualized portions of skin  Neurologic: No gross deficits  Spirometry:    Deferred per patient request   Assessment/Plan  Acute upper respiratory infection: - Continue cefdinir as prescribed by Dr. Dellis Anes -Mucinex 600 mg twice daily as needed for phlegm and mucus  Asthma-controlled Your lung exam sounds great.   I am giving you prednisone to start over the weekend if  your asthma gets worse, but you don't need it right now:  ---- take 20 mg (2 tablets) daily for 4 days then 10 mg ( 1 tablet) on day 5 - Daily controller medication(s): Singulair  daily and Asmanex 1-2 puffs twice daily  - Prior to physical activity: Xopenex 2 puffs 10-15 minutes before physical activity. - Rescue medications: Xopenex 4 puffs every 4-6 hours as needed - Changes during respiratory infections or worsening symptoms: Add on Pulmicort 0.5mg  to one treatment twice daily for TWO WEEKS. You can try half a dose to see if this helps. - Asthma control goals:  * Full participation in all desired activities (may need albuterol before activity) * Albuterol use two time or less a week on average (not counting use with activity) * Cough interfering with sleep two time or less a month * Oral steroids no more than once a year * No hospitalizations  Allergic rhinitis - Continue saline rinses several times a day - Continue cetirizine 10 mg once a day as needed for a runny nose or itch. - Continue flonase 1 spray each nostril daily (replacing Xhance due to cost)  Recurrent infections - start Privagen, use premedications as prescribed - Continue your azithromycin until privagen started as tolerated  Reflux - Continue dietary and lifestyle modifications as listed below - Continue with Dexilant as you are doing.  - Continue famotidine  nightly. - Carafate as needed  Vocal cord dysfunction - Continue with speech therapy at home lessons as you are doing.  - Continue with the belly breathing exercises  Food allergy - Continue to avoid tree nuts.  In case of an allergic reaction, take Benadryl 50 mg every 4 hours, and if life-threatening symptoms occur, inject with AuviQ 0.3 mg. - We will get that mixed tree nut challenge done at some point.   Return in about 6 months (around 03/29/2023).   Tonny Bollman, MD  [facility]

## 2022-09-27 NOTE — Patient Instructions (Addendum)
Assessment/Plan  Acute upper respiratory infection: - Continue cefdinir as prescribed by Dr. Dellis Anes -Mucinex 600 mg twice daily as needed for phlegm and mucus  Asthma Your lung exam sounds great.   I am giving you prednisone to start over the weekend if your asthma gets worse, but you don't need it right now:  ---- take 20 mg (2 tablets) daily for 4 days then 10 mg ( 1 tablet) on day 5 - Daily controller medication(s): Singulair  daily and Asmanex 1-2 puffs twice daily  - Prior to physical activity: Xopenex 2 puffs 10-15 minutes before physical activity. - Rescue medications: Xopenex 4 puffs every 4-6 hours as needed - Changes during respiratory infections or worsening symptoms: Add on Pulmicort 0.5mg  to one treatment twice daily for TWO WEEKS. You can try half a dose to see if this helps. - Asthma control goals:  * Full participation in all desired activities (may need albuterol before activity) * Albuterol use two time or less a week on average (not counting use with activity) * Cough interfering with sleep two time or less a month * Oral steroids no more than once a year * No hospitalizations  Allergic rhinitis - Continue saline rinses several times a day - Continue cetirizine 10 mg once a day as needed for a runny nose or itch. - Continue flonase 1 spray each nostril daily (replacing Xhance due to cost)  Recurrent infections - start Privagen, use premedications as prescribed - Continue your azithromycin until privagen started as tolerated  Reflux - Continue dietary and lifestyle modifications as listed below - Continue with Dexilant as you are doing.  - Continue famotidine  nightly. - Carafate as needed  Vocal cord dysfunction - Continue with speech therapy at home lessons as you are doing.  - Continue with the belly breathing exercises  Food allergy - Continue to avoid tree nuts.  In case of an allergic reaction, take Benadryl 50 mg every 4 hours, and if  life-threatening symptoms occur, inject with AuviQ 0.3 mg. - We will get that mixed tree nut challenge done at some point.   Return in about 6 months (around 03/29/2023).    Please inform us of any Emergency Department visits, hospitalizations, or changes in symptoms. Call us before going to the ED for breathing or allergy symptoms since we might be able to fit you in for a sick visit. Feel free to contact us anytime with any questions, problems, or concerns.  It was a pleasure to see you again today!  Websites that have reliable patient information: 1. American Academy of Asthma, Allergy, and Immunology: www.aaaai.org 2. Food Allergy Research and Education (FARE): foodallergy.org 3. Mothers of Asthmatics: http://www.asthmacommunitynetwork.org 4. American College of Allergy, Asthma, and Immunology: www.acaai.org   COVID-19 Vaccine Information can be found at: [email or web address] For questions related to vaccine distribution or appointments, please email [email or web address] or call [phone].   We realize that you might be concerned about having an allergic reaction to the COVID19 vaccines. To help with that concern, WE ARE OFFERING THE COVID19 VACCINES IN OUR OFFICE! Ask the front desk for dates!     "Like" [place] on Facebook and Instagram for our latest updates!      A healthy democracy works best when [employer] participate! Make sure you are registered to vote! If you have moved or changed any of your contact information, you will need to get this updated before voting!  In some cases, you MAY be able to register to vote online:  [email or web address]

## 2022-09-30 DIAGNOSIS — F419 Anxiety disorder, unspecified: Secondary | ICD-10-CM | POA: Diagnosis not present

## 2022-10-01 ENCOUNTER — Other Ambulatory Visit: Payer: Self-pay | Admitting: Family Medicine

## 2022-10-01 DIAGNOSIS — R002 Palpitations: Secondary | ICD-10-CM

## 2022-10-02 MED ORDER — DOXYCYCLINE MONOHYDRATE 100 MG PO TABS: 100.0000 mg | ORAL_TABLET | Freq: Two times a day (BID) | ORAL | 0 refills | Status: AC

## 2022-10-02 NOTE — Addendum Note (Signed)
Addended by: Alfonse Spruce on: 10/02/2022 12:47 PM   Modules accepted: Orders

## 2022-10-02 NOTE — Telephone Encounter (Signed)
Patient continuing to have colored mucous production and coughing. She is already on prednisone and the cefdinir. I am adding on doxycycline to get some atypical coverage.   Malachi Bonds, MD [facility]

## 2022-10-05 ENCOUNTER — Other Ambulatory Visit: Payer: Self-pay | Admitting: Student

## 2022-10-06 ENCOUNTER — Encounter: Payer: Self-pay | Admitting: Internal Medicine

## 2022-10-06 MED ORDER — PREDNISONE 10 MG PO TABS: 10.0000 mg | ORAL_TABLET | Freq: Two times a day (BID) | ORAL | 0 refills | Status: AC

## 2022-10-07 ENCOUNTER — Ambulatory Visit (INDEPENDENT_AMBULATORY_CARE_PROVIDER_SITE_OTHER): Payer: BC Managed Care – PPO | Admitting: Allergy & Immunology

## 2022-10-07 ENCOUNTER — Encounter: Payer: Self-pay | Admitting: Allergy & Immunology

## 2022-10-07 VITALS — BP 120/78 | HR 100 | Temp 97.6°F | Resp 18

## 2022-10-07 DIAGNOSIS — J383 Other diseases of vocal cords: Secondary | ICD-10-CM

## 2022-10-07 DIAGNOSIS — J302 Other seasonal allergic rhinitis: Secondary | ICD-10-CM

## 2022-10-07 DIAGNOSIS — K219 Gastro-esophageal reflux disease without esophagitis: Secondary | ICD-10-CM | POA: Diagnosis not present

## 2022-10-07 DIAGNOSIS — J4540 Moderate persistent asthma, uncomplicated: Secondary | ICD-10-CM | POA: Diagnosis not present

## 2022-10-07 DIAGNOSIS — J3089 Other allergic rhinitis: Secondary | ICD-10-CM

## 2022-10-07 DIAGNOSIS — D806 Antibody deficiency with near-normal immunoglobulins or with hyperimmunoglobulinemia: Secondary | ICD-10-CM

## 2022-10-07 DIAGNOSIS — K1379 Other lesions of oral mucosa: Secondary | ICD-10-CM

## 2022-10-07 DIAGNOSIS — G4733 Obstructive sleep apnea (adult) (pediatric): Secondary | ICD-10-CM

## 2022-10-07 MED ORDER — AZELASTINE HCL 0.1 % NA SOLN: 2.0000 | Freq: Two times a day (BID) | NASAL | 5 refills | Status: DC

## 2022-10-07 MED ORDER — EPINEPHRINE 0.3 MG/0.3ML IJ SOAJ: 0.3000 mg | Freq: Once | INTRAMUSCULAR | 2 refills | Status: AC

## 2022-10-07 NOTE — Patient Instructions (Addendum)
Asthma - Nicole Mendoza not done.  - Daily controller medication(s): Singulair  daily and Asmanex 1-2 puffs twice daily  - Prior to physical activity: Xopenex 2 puffs 10-15 minutes before physical activity. - Rescue medications: Xopenex 4 puffs every 4-6 hours as needed - Changes during respiratory infections or worsening symptoms: Add on Pulmicort 0.5mg  to one treatment twice daily for TWO WEEKS. - Asthma control goals:  * Full participation in all desired activities (may need albuterol before activity) * Albuterol use two time or less a week on average (not counting use with activity) * Cough interfering with sleep two time or less a month * Oral steroids no more than once a year * No hospitalizations  Allergic rhinitis - Continue saline rinses several times a day - Continue cetirizine 10 mg once a day as needed for a runny nose or itch. - Continue Flonase 2 sprays in each nostril twice a day for nasal symptoms - Add on Astelin two sprays per nostril twice daily in case this is related to postnasal drip.   Recurrent infections - I would consider adding on the hydroxyzine for your infusion date, but only if you feel that you need this.   Reflux - Continue dietary and lifestyle modifications as listed below - Continue with Dexilant as you are doing.  - Continue with famotidine  nightly. - Continue with  a refill of the Carafate.   Vocal cord dysfunction - Continue with speech therapy at home lessons as you are doing.  - Continue with the belly breathing exercises  Food allergy - Continue to avoid tree nuts.  In case of an allergic reaction, take Benadryl 50 mg every 4 hours, and if life-threatening symptoms occur, inject with AuviQ 0.3 mg. - We will get that mixed tree nut challenge done at some point.   Follow up as scheduled.     Please inform us of any Emergency Department visits, hospitalizations, or changes in symptoms. Call us before going to the ED for breathing or  allergy symptoms since we might be able to fit you in for a sick visit. Feel free to contact us anytime with any questions, problems, or concerns.  It was a pleasure to see you again today!  Websites that have reliable patient information: 1. American Academy of Asthma, Allergy, and Immunology: www.aaaai.org 2. Food Allergy Research and Education (FARE): foodallergy.org 3. Mothers of Asthmatics: http://www.asthmacommunitynetwork.org 4. American College of Allergy, Asthma, and Immunology: www.acaai.org   COVID-19 Vaccine Information can be found at: [email or web address].nl For questions related to vaccine distribution or appointments, please email [email or web address] or call [phone].   We realize that you might be concerned about having an allergic reaction to the COVID19 vaccines. To help with that concern, WE ARE OFFERING THE COVID19 VACCINES IN OUR OFFICE! Ask the front desk for dates!     "Like" [place] on Facebook and Instagram for our latest updates!      A healthy democracy works best when [employer] participate! Make sure you are registered to vote! If you have moved or changed any of your contact information, you will need to get this updated before voting!  In some cases, you MAY be able to register to vote online: [email or web address]

## 2022-10-07 NOTE — Progress Notes (Unsigned)
FOLLOW UP  Date of Service/Encounter:  10/08/22   Assessment:   Mild persistent asthma with acute exacerbation with multiple adverse reactions to a number of inhalers   Vocal cord dysfunction - graduated from speech therapy    Recurrent infections - improved following Pneumovax, but then worsened (streptococcal avidity assay abnormal) - scheduled to start IVIG (insurance would not approved SCIG and she is high risk, therefore she is starting in an infusion center)   Seasonal and perennial allergic rhinitis (trees, weeds, grasses and dust mites) - not well controlled    Gastroesophageal reflux disease - on Dexilant and famotidine and PRN carafate    Adverse food reactions - resolved with minimizing exposure to food additives and avoiding peanuts/tree nuts   Snoring with perceived poor sleep quality - improved with use of the CPAP   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 Dimple Casey  Long COVID syndrome  Plan/Recommendations:   Asthma - Cleda Daub not done.  - Daily controller medication(s): Singulair 10mg  daily and Asmanex 1-2 puffs twice daily  - Prior to physical activity: Xopenex 2 puffs 10-15 minutes before physical activity. - Rescue medications: Xopenex 4 puffs every 4-6 hours as needed - Changes during respiratory infections or worsening symptoms: Add on Pulmicort 0.5mg  to one treatment twice daily for TWO WEEKS. - Asthma control goals:  * Full participation in all desired activities (may need albuterol before activity) * Albuterol use two time or less a week on average (not counting use with activity) * Cough interfering with sleep two time or less a month * Oral steroids no more than once a year * No hospitalizations  Allergic rhinitis - Continue saline rinses several times a day - Continue cetirizine 10 mg once a day as needed for a runny nose or itch. - Continue Flonase 2 sprays in each nostril twice a day for nasal symptoms - Add on Astelin two  sprays per nostril twice daily in case this is related to postnasal drip.   Recurrent infections - I would consider adding on the hydroxyzine for your infusion date, but only if you feel that you need this.   Reflux - Continue dietary and lifestyle modifications as listed below - Continue with Dexilant as you are doing.  - Continue with famotidine 40mg  nightly. - Continue with  a refill of the Carafate.   Vocal cord dysfunction - Continue with speech therapy at home lessons as you are doing.  - Continue with the belly breathing exercises  Food allergy - Continue to avoid tree nuts.  In case of an allergic reaction, take Benadryl 50 mg every 4 hours, and if life-threatening symptoms occur, inject with AuviQ 0.3 mg. - We will get that mixed tree nut challenge done at some point.   Follow up as scheduled.     Subjective:   Nicole Mendoza is a 32 y.o. 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 11/19/2020   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   Recurrent sinusitis 11/09/2020   Pre-nodular edema of the vocal folds 11/09/2020   ASCUS of cervix with negative high risk HPV 07/10/2020   Cardiac murmur 09/02/2019   Tachycardia 09/01/2019   Salivary stone 09/01/2019   Anaphylactic shock due to adverse food reaction 01/22/2019   Seasonal  and perennial allergic rhinitis 01/22/2019   Morbid obesity with BMI of 45.0-49.9, adult 10/28/2017   Moderate persistent asthma without complication 07/29/2017   Essential hypertension 04/22/2017   Dysmenorrhea 06/26/2016   Menorrhagia with regular cycle 06/26/2016   Gastroesophageal reflux disease 12/22/2015   Vitamin D deficiency 11/02/2014    History obtained from: chart review and patient.  Nicole Mendoza is a 33 y.o. female presenting for a sick visit. She was last seen in April 2024 by Dr. Maurine Minister.  At that time, her lung exam sounded great.  She received a prednisone taper to cover her over the weekend.  She was continued on Singulair 10 mg daily and Asmanex 1 to 2 puffs twice daily.  She also has Xopenex that she adds during respiratory flares.  For her allergic rhinitis, she was continued on cetirizine and Flonase.  For reflux, she was continued on Dexilant as well as famotidine.  She also continued with Carafate as needed.  For history of recurrent infections, she was started on Prevagen and continued on azithromycin.  She continues to follow with speech therapy for vocal cord dysfunction.  She continue to avoid tree nuts.  Since last visit, she has continued to cough up green and yellow mucous. She did the doxycycline BID. She did the cefdinir, but she had some GI distress. She continued on doxycycline and she is around half way through. She took the prednisone. She got more prednisone over the weekend because she was having trouble breathing on Saturday. Prednisone helped the breathing, but then she developed some sore throat yesterday (Sunday). She notes that her uvula is larger. She does not have any fever or diarrhea.   Of note, she does report that she is in a POTS flare. This started Friday night after her GI distress. She has had some vocal cord "moments", but this is more of a gasping panic sensation. This is distinctly different.  This is like nothing else she has ever had before.  She is  having a lot of allergic rhinitis symptoms and does report some postnasal drip, although she does not think this is related.  She is only on Flonase.  She was previously on Xhance which was controlling her symptoms appropriately, but her insurance stopped covering the [place] and she is now on Flonase.  She has never been on Astelin.  She is open to trying new options.  She never felt like her throat was getting close, she just felt that her uvula was touching her tongue which was a new experience for her.  Likewise, she does not feel like this is related to her reflux.  She is on the PPI.  She only ends up famotidine when she is not feeling like it is under good control.  She has been using Carafate as well.  She remains on the Asmanex 1 to 2 puffs twice daily as well as the Singulair.  She has not been using her Pulmicort since being on the prednisone.  Her shortness of breath and pulmonary symptoms have improved since being on the prednisone.  She did have to get the extra dose over the weekend, but otherwise she has done fairly well.  Her son recently stopped wheezing as well.  She does have an appointment to start her infusions next Friday on May 3.  Apparently her insurance requires her to use an infusion center near [place] or near [place].  She is going to the one near [place].  She has a friend is going with her as well in case she does not feel well after the infusion.  She did arrange to get premedicated with IV fluids before the subcutaneous immunoglobulin.  This will make the entire process take around 6 or 7 hours, but she feels that this is the best way to do it given her POTS.  She has having a lot of issues with her job.  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  She is thinking about doing something that she can do completely from home.  However, those jobs do not pay as much and they have unfortunately collected some debt  over the last few years that they are trying to pay off.  She also has her official certification in hospice, which gives her a little pain pump.  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  Her husband is now working 2 jobs as well, including a job at [employer] which unfortunately does not provide any discount on groceries.  Otherwise, there have been no changes to her past medical history, surgical history, family history, or social history.    Review of Systems  Constitutional: Negative.  Negative for chills, fever, malaise/fatigue and weight loss.  HENT:  Positive for congestion. Negative for ear discharge, ear pain and sinus pain.   Eyes:  Negative for pain, discharge and redness.  Respiratory:  Positive for cough. Negative for hemoptysis, sputum production, shortness of breath and wheezing.   Cardiovascular: Negative.  Negative for chest pain and palpitations.  Gastrointestinal:  Negative for abdominal pain, constipation, diarrhea, heartburn, nausea and vomiting.  Skin: Negative.  Negative for itching and rash.  Neurological:  Negative for dizziness and headaches.  Endo/Heme/Allergies:  Positive for environmental allergies. Does not bruise/bleed easily.       Objective:   Blood pressure 120/78, pulse 100, temperature 97.6 F (36.4 C), temperature source Temporal, resp. rate 18, SpO2 99 %. There is no height or weight on file to calculate BMI.    Physical Exam Vitals reviewed.  Constitutional:      Appearance: She is well-developed. She is obese.     Comments: Talkative as always.  Spirited.  HENT:     Head: Normocephalic and atraumatic.     Right Ear: Tympanic membrane, ear canal and external ear normal.     Left Ear: Tympanic membrane, ear canal and external ear normal.     Nose: No nasal deformity, septal deviation, mucosal edema or rhinorrhea.     Right Turbinates: Enlarged, swollen and pale.     Left Turbinates: Enlarged, swollen and pale.      Right Sinus: No maxillary sinus tenderness or frontal sinus tenderness.     Left Sinus: No maxillary sinus tenderness or frontal sinus tenderness.     Mouth/Throat:     Lips: Pink.     Mouth: Mucous membranes are not pale and not dry.     Pharynx: Uvula midline. No pharyngeal swelling or oropharyngeal exudate.     Tonsils:  2+ on the right. 2+ on the left.     Comments: Uvula does seem larger than before and does occasionally touch the back of her tongue.  However, it also retracts and gets larger and smaller with inspiration and expiration.  Airway is otherwise open.  There is no erythema or pustules. Eyes:     General: Lids are normal. No allergic shiner.       Right eye: No discharge.        Left eye: No discharge.     Conjunctiva/sclera:     Right eye: Right conjunctiva is not injected. No chemosis.    Left eye: Left conjunctiva is not injected. No chemosis.    Pupils: Pupils are equal, round, and reactive to light.  Cardiovascular:     Rate and Rhythm: Normal rate and regular rhythm.     Heart sounds: Normal heart sounds.  Pulmonary:     Effort: Pulmonary effort is normal. No tachypnea, accessory muscle usage or respiratory distress.     Breath sounds: Normal breath sounds. No wheezing, rhonchi or rales.     Comments: Moving air well in all lung fields.  No increased work of breathing. Chest:     Chest wall: No tenderness.  Lymphadenopathy:     Cervical: No cervical adenopathy.  Skin:    General: Skin is warm.     Capillary Refill: Capillary refill takes less than 2 seconds.     Coloration: Skin is not pale.     Findings: No abrasion, erythema, petechiae or rash. Rash is not papular, urticarial or vesicular.     Comments: No eczematous or urticarial lesions noted.  Neurological:     Mental Status: She is alert.  Psychiatric:        Behavior: Behavior is cooperative.      Diagnostic studies: none       Malachi Bonds, MD  [facility]

## 2022-10-08 ENCOUNTER — Encounter: Payer: Self-pay | Admitting: Allergy & Immunology

## 2022-10-10 DIAGNOSIS — Z124 Encounter for screening for malignant neoplasm of cervix: Secondary | ICD-10-CM | POA: Diagnosis not present

## 2022-10-10 DIAGNOSIS — L680 Hirsutism: Secondary | ICD-10-CM | POA: Diagnosis not present

## 2022-10-10 DIAGNOSIS — R102 Pelvic and perineal pain: Secondary | ICD-10-CM | POA: Diagnosis not present

## 2022-10-10 DIAGNOSIS — Z1151 Encounter for screening for human papillomavirus (HPV): Secondary | ICD-10-CM | POA: Diagnosis not present

## 2022-10-10 DIAGNOSIS — N946 Dysmenorrhea, unspecified: Secondary | ICD-10-CM | POA: Diagnosis not present

## 2022-10-10 DIAGNOSIS — Z01411 Encounter for gynecological examination (general) (routine) with abnormal findings: Secondary | ICD-10-CM | POA: Diagnosis not present

## 2022-10-10 DIAGNOSIS — M797 Fibromyalgia: Secondary | ICD-10-CM | POA: Diagnosis not present

## 2022-10-12 ENCOUNTER — Other Ambulatory Visit: Payer: Self-pay | Admitting: Internal Medicine

## 2022-10-12 ENCOUNTER — Other Ambulatory Visit: Payer: Self-pay | Admitting: Allergy & Immunology

## 2022-10-14 ENCOUNTER — Encounter (INDEPENDENT_AMBULATORY_CARE_PROVIDER_SITE_OTHER): Payer: Self-pay | Admitting: Physician Assistant

## 2022-10-14 ENCOUNTER — Ambulatory Visit (INDEPENDENT_AMBULATORY_CARE_PROVIDER_SITE_OTHER): Payer: BC Managed Care – PPO | Admitting: Physician Assistant

## 2022-10-14 VITALS — BP 122/64 | HR 90 | Temp 97.9°F | Ht 68.0 in | Wt 297.0 lb

## 2022-10-14 DIAGNOSIS — F5089 Other specified eating disorder: Secondary | ICD-10-CM | POA: Diagnosis not present

## 2022-10-14 DIAGNOSIS — R7303 Prediabetes: Secondary | ICD-10-CM

## 2022-10-14 DIAGNOSIS — I10 Essential (primary) hypertension: Secondary | ICD-10-CM

## 2022-10-14 DIAGNOSIS — Z6841 Body Mass Index (BMI) 40.0 and over, adult: Secondary | ICD-10-CM

## 2022-10-14 NOTE — Progress Notes (Unsigned)
Office: [phone]  /  Fax: [phone]  WEIGHT SUMMARY AND BIOMETRICS  Vitals Temp: 97.9 F (36.6 C) BP: 122/64 Pulse Rate: 90 SpO2: 97 %   Anthropometric Measurements Height: 5\' 8"  (1.727 m) Weight: 297 lb (134.7 kg) BMI (Calculated): 45.17 Weight at Last Visit: 293 lb Weight Lost Since Last Visit: 0 lb Weight Gained Since Last Visit: 4 lb Starting Weight: 301 lb   Body Composition  Body Fat %: 48.2 % Fat Mass (lbs): 143.4 lbs Muscle Mass (lbs): 146.2 lbs Total Body Water (lbs): 114.2 lbs Visceral Fat Rating : 14   Other Clinical Data Fasting: No Labs: No Today's Visit #: 7 Starting Date: 12/26/21     HPI  Chief Complaint: OBESITY  Nicole Mendoza is here to discuss her progress with her obesity treatment plan. She is on the the Category 3 Plan and states she is following her eating plan approximately 0 % of the time. She states she is exercising 0 minutes 0 times per week.   Interval History:  Since last office visit she is up 4 pounds. Last visit was with on [place], FNP on 04/25/2022, last in office visit with Dr. Sharee Holster 02/28/2022.  She is wanting to get back on track with her nutrition plan.  She reports numerous illnesses including influenza back in December, followed by COVID, as well as sickness in family members including RSV and norovirus. She was treated with Paxlovid following her COVID infection. She reports that she has been severely deconditioned following the COVID infection as well as due to a history of fibromyalgia/chronic fatigue syndrome.  There was also some concern for POTS syndrome and she has been on increased salt intake and compression stockings due to some POTS symptoms but these are gradually improving.  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  Her husband works nights.  They have 2 children a 71-year-old and a 1-month-old both of whom have been sick recently as well.   Pharmacotherapy: None for weight loss.    PHYSICAL EXAM:  Blood pressure 122/64, pulse 90, temperature 97.9 F (36.6 C), height 5\' 8"  (1.727 m), weight 297 lb (134.7 kg), SpO2 97 %. Body mass index is 45.16 kg/m.  General: She is overweight, cooperative, alert, well developed, and in no acute distress. PSYCH: Has normal mood, affect and thought process.   Cardiovascular : HR 90's regular Lungs: Normal breathing effort, no conversational dyspnea. Neuro: no focal deficits  DIAGNOSTIC DATA REVIEWED:  BMET    Component Value Date/Time   NA 140 06/12/2022 1610   NA 138 06/04/2022 1139   K 3.8 06/12/2022 1610   CL 108 06/12/2022 1610   CO2 22 06/12/2022 1610   GLUCOSE 118 (H) 06/12/2022 1610   BUN 12 06/12/2022 1610   BUN 14 06/04/2022 1139   CREATININE 0.66 06/12/2022 1610   CREATININE 0.69 05/09/2021 1415   CALCIUM 9.4 06/12/2022 1610   GFRNONAA >60 04/14/2020 0749   GFRAA >60 03/13/2020 1234   Lab Results  Component Value Date   HGBA1C 5.5 12/26/2021   HGBA1C 5.6 06/22/2021   Lab Results  Component Value Date   INSULIN 23.1 12/26/2021   Lab Results  Component Value Date   TSH 2.020 12/26/2021   CBC    Component Value Date/Time   WBC 8.3 12/26/2021 1134   WBC 7.9 07/09/2021 1132   RBC 4.88 12/26/2021 1134   RBC 4.70 07/09/2021 1132   HGB 13.6 12/26/2021 1134   HCT 42.0 12/26/2021 1134  PLT 291 12/26/2021 1134   MCV 86 12/26/2021 1134   MCH 27.9 12/26/2021 1134   MCH 28.6 05/09/2021 1415   MCHC 32.4 12/26/2021 1134   MCHC 32.9 07/09/2021 1132   RDW 13.1 12/26/2021 1134   Iron Studies No results found for: "IRON", "TIBC", "FERRITIN", "IRONPCTSAT" Lipid Panel     Component Value Date/Time   CHOL 158 12/26/2021 1134   TRIG 87 12/26/2021 1134   HDL 52 12/26/2021 1134   LDLCALC 90 12/26/2021 1134   Hepatic Function Panel     Component Value Date/Time   PROT 7.7 03/11/2022 0929   PROT 7.4 12/26/2021 1134   ALBUMIN 4.1 03/11/2022 0929   ALBUMIN 4.5 12/26/2021 1134   AST 16 03/11/2022  0929   ALT 22 03/11/2022 0929   ALKPHOS 81 03/11/2022 0929   BILITOT 0.5 03/11/2022 0929   BILITOT 0.5 12/26/2021 1134      Component Value Date/Time   TSH 2.020 12/26/2021 1134   Nutritional Lab Results  Component Value Date   VD25OH 25.2 (L) 12/26/2021   VD25OH 41.0 09/01/2019    ASSOCIATED CONDITIONS ADDRESSED TODAY  ASSESSMENT AND PLAN  Problem List Items Addressed This Visit     Essential hypertension    Hypertension Hypertension well controlled, asymptomatic, and no significant medication side effects noted.  Medication(s): toprol XL 100 mg daily  BP Readings from Last 3 Encounters:  10/14/22 122/64  10/07/22 120/78  09/27/22 130/78   Lab Results  Component Value Date   CREATININE 0.66 06/12/2022   CREATININE 0.70 06/04/2022   CREATININE 0.64 03/11/2022   Lab Results  Component Value Date   GFR 117.07 06/12/2022   GFR 118.15 03/11/2022   GFR 118.27 07/09/2021   Plan: Continue all antihypertensives at current dosages. Continue to work on nutrition plan to promote weight loss and improve BP control.         Morbid obesity (HCC)   Prediabetes - Primary    Prediabetes Last A1c was 5.5  Medication(s): None She is ready to resume working on nutrition plan to decrease simple carbohydrates, increase lean proteins and exercise to promote weight loss, improve glycemic control and prevent progression to Type 2 diabetes.   Lab Results  Component Value Date   HGBA1C 5.5 12/26/2021   HGBA1C 5.6 06/22/2021   Lab Results  Component Value Date   INSULIN 23.1 12/26/2021   Plan:  Continue working on nutrition plan to decrease simple carbohydrates, increase lean proteins and exercise to promote weight loss, improve glycemic control and prevent progression to Type 2 diabetes.  Will plan to recheck A1c, insulin levels over the next 1-2 months.        Eating disorder    Eating disorder/emotional eating Nicole Mendoza has had issues with stress/emotional  eating. Currently this is moderately controlled. Overall mood is stable. Medication(s): None She is working a a Paramedic to address some concerns, but is interested in working on emotional eating behaviors.  She has some strategies in place, but would like to continue to develop other strategies to help with emotional eating.   Plan:  Continue to work with usual therapist and  Referral to Dr. Dewaine Conger.  Continue to work on emotional eating strategies.           BMI 45.0-49.9, adult (HCC) Current BMI 45.2   Ready to get back on track with nutrition plan .  RMR [id number] 12/26/2021- Discussed starting back with Cat 3 plan and patient agreeable to this plan. Does not think she  could journal at this point.   TREATMENT PLAN FOR OBESITY:  Recommended Dietary Goals  Narcisa is currently in the action stage of change. As such, her goal is to continue weight management plan. She has agreed to the Category 3 Plan.  Behavioral Intervention  We discussed the following Behavioral Modification Strategies today: increasing lean protein intake, decreasing simple carbohydrates , increasing vegetables, avoiding skipping meals, increasing water intake, continue to practice mindfulness when eating, and planning for success.  Additional resources provided today: Cat 3 with grocery list and lunch and breakfast options  Recommended Physical Activity Goals  Deana has been advised to work up to 150 minutes of moderate intensity aerobic activity a week and strengthening exercises 2-3 times per week for cardiovascular health, weight loss maintenance and preservation of muscle mass.   She has agreed to Continue current level of physical activity    Pharmacotherapy We discussed various medication options to help Carmellia with her weight loss efforts and we both agreed to continue to work on nutritional and behavioral strategies to promote weight loss.  .    Return in about 3 weeks (around 11/04/2022).Marland Kitchen She was  informed of the importance of frequent follow up visits to maximize her success with intensive lifestyle modifications for her multiple health conditions.   ATTESTASTION STATEMENTS:  Reviewed by clinician on day of visit: allergies, medications, problem list, medical history, surgical history, family history, social history, and previous encounter notes.   I have personally spent 30 minutes total time today in preparation, patient care, nutritional counseling and documentation for this visit, including the following: review of clinical lab tests; review of medical tests/procedures/services.      Nicole Gullickson, PA-C

## 2022-10-14 NOTE — Progress Notes (Unsigned)
Office: [phone]  /  Fax: [phone]    Date: 10/28/2022   Appointment Start Time: 9:03am Duration: 42 minutes Provider: Lawerance Cruel, Psy.D. Type of Session: Intake for Individual Therapy  Location of Patient: Home (private location) Location of Provider: Provider's home (private office) Type of Contact: Telepsychological Visit via MyChart Video Visit  Informed Consent: Prior to proceeding with today's appointment, two pieces of identifying information were obtained. In addition, Nicole Mendoza's physical location at the time of this appointment was obtained as well a phone number she could be reached at in the event of technical difficulties. Nicole Mendoza and this provider participated in today's telepsychological service.   The provider's role was explained to Nicole Mendoza. The provider reviewed and discussed issues of confidentiality, privacy, and limits therein (e.g., reporting obligations). In addition to verbal informed consent, written informed consent for psychological services was obtained prior to the initial appointment. Since the clinic is not a 24/7 crisis center, mental health emergency resources were shared and this  provider explained MyChart, e-mail, voicemail, and/or other messaging systems should be utilized only for non-emergency reasons. This provider also explained that information obtained during appointments will be placed in Klair's medical record and relevant information will be shared with other providers at [facility] for coordination of care. Nicole Mendoza agreed information may be shared with other [facility] providers as needed for coordination of care and by signing the service agreement document, she provided written consent for coordination of care. Prior to initiating telepsychological services, Nicole Mendoza completed an informed consent document, which included the development of a safety plan (i.e., an emergency contact and emergency resources) in the event  of an emergency/crisis. Nicole Mendoza verbally acknowledged understanding she is ultimately responsible for understanding her insurance benefits for telepsychological and in-person services. This provider also reviewed confidentiality, as it relates to telepsychological services. Nicole Mendoza  acknowledged understanding that appointments cannot be recorded without both party consent and she is aware she is responsible for securing confidentiality on her end of the session. Nicole Mendoza verbally consented to proceed.  Chief Complaint/HPI: Nicole Mendoza was referred by Lazaro Arms, PA-C due to  Eating disorder/emotional eating . Per the note for the visit with Lazaro Arms, PA-C on 10/14/2022, "Justiss has had issues with stress/emotional eating. Currently this is moderately controlled. Overall mood is stable. Medication(s): None She is working a a Paramedic to address some concerns, but is interested in working on emotional eating behaviors. She has some strategies in place, but would like to continue to develop other strategies to help with emotional eating."   During today's appointment, Nicole Mendoza was verbally administered a questionnaire assessing various behaviors related to emotional eating behaviors. Nicole Mendoza endorsed the following: experience food cravings on a regular basis, eat certain foods when you are anxious, stressed, depressed, or your feelings are hurt, use food to help you cope with emotional situations, find food is comforting to you, overeat when you are angry or upset, and overeat when you are worried about something. She shared she craves chips, sweets, and carbs. Nicole Mendoza believes the onset of emotional eating behaviors was likely in the last 10 years, and described the current frequency of emotional eating behaviors as "closer to daily than few times a week." In addition, Jasmeen denied a history of binge eating behaviors. Nicole Mendoza denied a history of significantly restricting food intake, purging and engagement in other compensatory  strategies for weight loss, and has never been diagnosed with an eating disorder. She also denied a history of treatment for emotional  eating behaviors.  Mental Status Examination:  Appearance: neat Behavior: appropriate to circumstances Mood: neutral Affect: mood congruent Speech: WNL Eye Contact: appropriate Psychomotor Activity: WNL Gait: unable to assess  Thought Process: linear, logical, and goal directed and denies suicidal, homicidal, and self-harm ideation, plan and intent  Thought Content/Perception: no hallucinations, delusions, bizarre thinking or behavior endorsed or observed Orientation: AAOx4 Memory/Concentration: intact Insight/Judgment: fair  Family & Psychosocial History: Nicole Mendoza reported she is married and she has four foster children (ages 11 months, 5, 7, and 9). 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 Additionally, Isola shared her highest level of education obtained is an associate's degree. Currently, Nicole Mendoza's social support system consists of her mother, husband, best friend, and "second mom." Moreover, Kailanie stated she resides with her husband and children.   Medical History:  Past Medical History:  Diagnosis Date   Allergic rhinitis    Allergy    Angio-edema    Asthma    Dysmenorrhea    Fatty liver    GERD (gastroesophageal reflux disease)    Hypertension    Recurrent upper respiratory infection (URI)    Sleep apnea    Tachycardia    Past Surgical History:  Procedure Laterality Date   NO PAST SURGERIES     Current Outpatient Medications on File Prior to Visit  Medication Sig Dispense Refill   Ascorbic Acid (SUPER C COMPLEX PO)      ASMANEX, 30 METERED DOSES, 220 MCG/ACT inhaler INHALE 2 PUFFS INTO THE LUNGS TWICE A DAY 5 each 0   azelastine (ASTELIN) 0.1 % nasal spray Place 2 sprays into both nostrils 2 (two) times daily. Use in each nostril as directed 30 mL 5   budesonide (PULMICORT) 0.5 MG/2ML nebulizer solution Take 2 mLs (0.5  mg total) by nebulization as needed. 60 mL 2   cetirizine (ZYRTEC) 10 MG tablet Take 10 mg by mouth daily.     Cholecalciferol 100 MCG (4000 UT) CAPS Take 1 capsule (4,000 Units total) by mouth daily. 30 capsule 0   dexlansoprazole (DEXILANT) 60 MG capsule TAKE 1 CAPSULE BY MOUTH EVERY DAY **PA** 90 capsule 1   famotidine (PEPCID) 40 MG tablet Take 1 tablet (40 mg total) by mouth daily. (Patient not taking: Reported on 09/09/2022) 90 tablet 2   ferrous sulfate 325 (65 FE) MG tablet 3 (three) times a week.      fluticasone (FLONASE) 50 MCG/ACT nasal spray Place 2 sprays into both nostrils daily. 16 g 5   hydrOXYzine (ATARAX) 25 MG tablet Take 1 tablet (25 mg total) by mouth every 6 (six) hours as needed for anxiety (allergies). 30 tablet 1   ipratropium (ATROVENT) 0.02 % nebulizer solution Take 1.25 mLs (0.25 mg total) by nebulization 4 (four) times daily. Mix 0.5 vial in with levalbuterol nebulizer and can use every 6 hours as needed for shortness of breath and wheezing. 75 mL 3   levalbuterol (XOPENEX HFA) 45 MCG/ACT inhaler Inhale 2 puffs into the lungs every 6 (six) hours as needed. 1 each 3   levalbuterol (XOPENEX) 1.25 MG/3ML nebulizer solution USE 1 VIAL BY NEBULIZATION EVERY 6 (SIX) HOURS AS NEEDED FOR WHEEZING. 540 mL 1   metoprolol succinate (TOPROL-XL) 100 MG 24 hr tablet TAKE 1 TABLET BY MOUTH EVERY DAY WITH OR IMMEDIATELY FOLLOWING A MEAL 90 tablet 3   metoprolol tartrate (LOPRESSOR) 50 MG tablet TAKE 1 TABLET (50 MG TOTAL) BY MOUTH AS NEEDED (FOR PALPITATIONS). 90 tablet 1   mometasone (ASMANEX, 120  METERED DOSES,) 220 MCG/ACT inhaler Inhale 2 puffs into the lungs 2 (two) times daily. 1 each 5   montelukast (SINGULAIR) 10 MG tablet TAKE 1 TABLET BY MOUTH EVERY DAY 90 tablet 1   norethindrone (MICRONOR) 0.35 MG tablet Take 0.35 mg by mouth.     ondansetron (ZOFRAN) 4 MG tablet Take 1 tablet (4 mg total) by mouth every 8 (eight) hours as needed for nausea or vomiting. 20 tablet 0    Prenatal Vit-Fe Fumarate-FA (MULTIVITAMIN-PRENATAL) 27-0.8 MG TABS tablet Take 1 tablet by mouth daily at 12 noon.     Probiotic Product (PHILLIPS COLON HEALTH) CAPS      sucralfate (CARAFATE) 1 g tablet Take 1 tablet (1 g total) by mouth 4 (four) times daily -  with meals and at bedtime. 120 tablet 2   No current facility-administered medications on file prior to visit.  Nicole Mendoza stated she is medication compliant.   Mental Health History: Nicole Mendoza reported she currently meets with a therapist (Nicole Mendoza, [facility] with [facility]) since 2022 to help cope with multiple illnesses. They meet once a month and the focus of treatmentdoes not include eating-related concerns. Nicole Mendoza agreed to sign an authorization for coordination of care if deemed necessary.  She reported she is prescribed atarax PRN when she has asthma flares, noting Dr. Horald Chestnut (asthma/allergy doctor) prescribes it. Nicole Mendoza reported there is no history of hospitalizations for psychiatric concerns. Nicole Mendoza denied a family history of mental health/substance abuse related concerns. Furthermore,  Nicole Mendoza denied a history of psychological, physical , and sexual abuse, as well as neglect. However, she reported recent medical concerns as traumatic.   Nicole Mendoza described her typical mood lately as "overwhelmed," noting she has a lot to keep up with. She explained she has "issues with not being in control." Rya explained that due to frequent illness for herself and family members for four months, she experiences worry about making plans as she feels someone will be sick. The aforementioned has resulted in "isolation." She also reported experiencing difficulty focusing, a decrease in productivity, and forgetfulness when she is feeling overwhelmed. Additionally, she reported challenges with trouble waking up in the morning regardless of number of hours of sleep. It was recommended she follow-up with her sleep doctor. Moreover, she disclosed a history of  experiencing panic attacks when she is sick and cannot breathe, noting the last time was December 2023. Selinda denied current alcohol use. She denied tobacco use. She denied illicit/recreational substance use. Furthermore, Umaiza indicated she is not experiencing the following: hallucinations and delusions, paranoia, symptoms of mania , social withdrawal, crying spells, symptoms of trauma, and obsessions and compulsions. She also denied history of and current suicidal ideation, plan, and intent; history of and current homicidal ideation, plan, and intent; and history of and current engagement in self-harm.  Legal History: Kersey reported there is no history of legal involvement.   Structured Assessments Results: The Patient Health Questionnaire-9 (PHQ-9) is a self-report measure that assesses symptoms and severity of depression over the course of the last two weeks. Deborra obtained a score of 11 suggesting moderate depression. Jeileen finds the endorsed symptoms to be very difficult. [0= Not at all; 1= Several days; 2= More than half the days; 3= Nearly every day] Little interest or pleasure in doing things 0  Feeling down, depressed, or hopeless-sick from a treatment 2  Trouble falling or staying asleep, or sleeping too much 0  Feeling tired or having little energy 3  Poor appetite or overeating- fluctuations 3  Feeling bad about yourself --- or that you are a failure or have let yourself or your family down 0  Trouble concentrating on things, such as reading the newspaper or watching television 3  Moving or speaking so slowly that other people could have noticed? Or the opposite --- being so fidgety or restless that you have been moving around a lot more than usual 0  Thoughts that you would be better off dead or hurting yourself in some way 0  PHQ-9 Score 11    The Generalized Anxiety Disorder-7 (GAD-7) is a brief self-report measure that assesses symptoms of anxiety over the course of the last two  weeks. Korina obtained a score of 9 suggesting mild anxiety. Dioselyn finds the endorsed symptoms to be not difficult at all. [0= Not at all; 1= Several days; 2= Over half the days; 3= Nearly every day] Feeling nervous, anxious, on edge 0  Not being able to stop or control worrying 0  Worrying too much about different things-"worrier by nature"; described it as generalized 3  Trouble relaxing 3  Being so restless that it's hard to sit still 0  Becoming easily annoyed or irritable 3  Feeling afraid as if something awful might happen 0  GAD-7 Score 9   Interventions:  Conducted a chart review Focused on rapport building Verbally administered PHQ-9 and GAD-7 for symptom monitoring Verbally administered Food & Mood questionnaire to assess various behaviors related to emotional eating Provided emphatic reflections and validation Collaborated with patient on a treatment goal  Psychoeducation provided regarding physical versus emotional hunger  Diagnostic Impressions & Provisional DSM-5 Diagnosis(es): Maree reported a history of engagement in emotional eating behaviors starting in the last 10 years, and described the current frequency of emotional eating behaviors as "closer to daily than few times a week."  She denied engagement in any other disordered eating behaviors. As such, the following diagnosis was assigned: F50.89 Other Specified Feeding or Eating Disorder, Emotional Eating Behaviors. 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 Given the limited scope of this appointment and this provider's role with the clinic, the following diagnosis was assigned: F41.9 Unspecified Anxiety Disorder. Moreover, Haasini discussed experiencing various symptoms since experiencing multiple illnesses resulting in her seeking therapeutic services. As such, the following diagnosis was assigned: Adjustment  Disorder, Unspecified. It was recommended she continue with her primary therapist.   Plan: Anglica appears able and willing to participate as evidenced by collaboration on a treatment goal, engagement in reciprocal conversation, and asking questions as needed for clarification. The next appointment is scheduled for 11/19/2022 at 11am, which will be via MyChart Video Visit. The following treatment goal was established: increase coping skills. This provider will regularly review the treatment plan and medical chart to keep informed of status changes. Zahraa expressed understanding and agreement with the initial treatment plan of care. Emyli will be sent a handout via e-mail to utilize between now and the next appointment to increase awareness of hunger patterns and subsequent eating. Marcianna provided verbal consent during today's appointment for this provider to send the handout via e-mail. Matalie will continue with her primary therapist.

## 2022-10-15 DIAGNOSIS — R7303 Prediabetes: Secondary | ICD-10-CM | POA: Insufficient documentation

## 2022-10-15 DIAGNOSIS — F509 Eating disorder, unspecified: Secondary | ICD-10-CM | POA: Insufficient documentation

## 2022-10-15 DIAGNOSIS — Z6841 Body Mass Index (BMI) 40.0 and over, adult: Secondary | ICD-10-CM | POA: Insufficient documentation

## 2022-10-15 NOTE — Assessment & Plan Note (Signed)
Eating disorder/emotional eating Nicole Mendoza has had issues with stress/emotional eating. Currently this is moderately controlled. Overall mood is stable. Medication(s): None She is working a a Paramedic to address some concerns, but is interested in working on emotional eating behaviors.  She has some strategies in place, but would like to continue to develop other strategies to help with emotional eating.   Plan:  Continue to work with usual therapist and  Referral to Dr. Dewaine Conger.  Continue to work on emotional eating strategies.

## 2022-10-15 NOTE — Assessment & Plan Note (Signed)
Prediabetes Last A1c was 5.5  Medication(s): None She is ready to resume working on nutrition plan to decrease simple carbohydrates, increase lean proteins and exercise to promote weight loss, improve glycemic control and prevent progression to Type 2 diabetes.   Lab Results  Component Value Date   HGBA1C 5.5 12/26/2021   HGBA1C 5.6 06/22/2021   Lab Results  Component Value Date   INSULIN 23.1 12/26/2021    Plan:  Continue working on nutrition plan to decrease simple carbohydrates, increase lean proteins and exercise to promote weight loss, improve glycemic control and prevent progression to Type 2 diabetes.  Will plan to recheck A1c, insulin levels over the next 1-2 months.

## 2022-10-15 NOTE — Assessment & Plan Note (Signed)
Hypertension Hypertension well controlled, asymptomatic, and no significant medication side effects noted.  Medication(s): toprol XL 100 mg daily  BP Readings from Last 3 Encounters:  10/14/22 122/64  10/07/22 120/78  09/27/22 130/78   Lab Results  Component Value Date   CREATININE 0.66 06/12/2022   CREATININE 0.70 06/04/2022   CREATININE 0.64 03/11/2022   Lab Results  Component Value Date   GFR 117.07 06/12/2022   GFR 118.15 03/11/2022   GFR 118.27 07/09/2021    Plan: Continue all antihypertensives at current dosages. Continue to work on nutrition plan to promote weight loss and improve BP control.

## 2022-10-18 DIAGNOSIS — D806 Antibody deficiency with near-normal immunoglobulins or with hyperimmunoglobulinemia: Secondary | ICD-10-CM | POA: Diagnosis not present

## 2022-10-22 ENCOUNTER — Telehealth: Payer: Self-pay | Admitting: Allergy & Immunology

## 2022-10-22 ENCOUNTER — Telehealth: Payer: Self-pay

## 2022-10-22 NOTE — Telephone Encounter (Signed)
Received via fax Nursing Orders from [facility].  Forms have been placed in provider's in basket for review/completion/signature.  Forwarding message to provider as update.

## 2022-10-22 NOTE — Telephone Encounter (Signed)
Patient contacted me several times over the weekend.  She had her first infusion on Friday.  Over the course of the weekend, she developed a really terrible headache as well as chills.  She had no fevers with this.  She has been hydrating appropriately and got pretty IVIG infusion and post IVIG infusion on Friday.  She has had a lot of fatigue over the weekend to have trouble even getting out of bed.  Her fatigue over the weekend and even before has been debilitating.  She is wondering if we can change to subcutaneous immunoglobulin  I will route this to [name].  Can we change to [place] instead? Let's start with weekly since I think the side affect profile will be better with a lower volume.  Malachi Bonds, MD [facility]

## 2022-10-28 ENCOUNTER — Telehealth (INDEPENDENT_AMBULATORY_CARE_PROVIDER_SITE_OTHER): Payer: BC Managed Care – PPO | Admitting: Psychology

## 2022-10-28 DIAGNOSIS — F5089 Other specified eating disorder: Secondary | ICD-10-CM | POA: Diagnosis not present

## 2022-10-28 DIAGNOSIS — F419 Anxiety disorder, unspecified: Secondary | ICD-10-CM

## 2022-10-28 DIAGNOSIS — F4320 Adjustment disorder, unspecified: Secondary | ICD-10-CM

## 2022-10-29 ENCOUNTER — Ambulatory Visit: Payer: BC Managed Care – PPO | Admitting: Allergy & Immunology

## 2022-10-29 NOTE — Telephone Encounter (Signed)
L/m for patient to contact me to advise approval and submit for Hizentra to [facility]

## 2022-10-29 NOTE — Telephone Encounter (Signed)
Spoke to patient at length about changing over to Hizentra with reminder to hydrate prior and during and she advised she did not think she needs IV hydration at this time. I have sent Rx to [facility] and they should be reaching out to her to set up nursing to get her started and she can use dose she already has in fridge. SHe does state that after she got over the fatigue she is breathing and feeling better

## 2022-10-30 NOTE — Telephone Encounter (Signed)
Great - thanks for the update.  Lenda Baratta, MD [facility]  

## 2022-10-31 ENCOUNTER — Telehealth: Payer: Self-pay

## 2022-10-31 NOTE — Telephone Encounter (Signed)
Received fax from [facility] - Nursing Orders for patient - DOB verified- forms have been placed in provider's in basket to review/complete/sign.

## 2022-11-04 ENCOUNTER — Ambulatory Visit (INDEPENDENT_AMBULATORY_CARE_PROVIDER_SITE_OTHER): Payer: BC Managed Care – PPO | Admitting: Physician Assistant

## 2022-11-04 DIAGNOSIS — F5089 Other specified eating disorder: Secondary | ICD-10-CM

## 2022-11-04 DIAGNOSIS — R7303 Prediabetes: Secondary | ICD-10-CM

## 2022-11-04 DIAGNOSIS — F419 Anxiety disorder, unspecified: Secondary | ICD-10-CM | POA: Diagnosis not present

## 2022-11-04 DIAGNOSIS — I10 Essential (primary) hypertension: Secondary | ICD-10-CM

## 2022-11-06 NOTE — Telephone Encounter (Signed)
Samantha from [facility] called to check the status of the nursing orders for the patient.

## 2022-11-12 ENCOUNTER — Encounter: Payer: Self-pay | Admitting: Allergy & Immunology

## 2022-11-12 ENCOUNTER — Ambulatory Visit (INDEPENDENT_AMBULATORY_CARE_PROVIDER_SITE_OTHER): Payer: BC Managed Care – PPO | Admitting: Allergy & Immunology

## 2022-11-12 ENCOUNTER — Other Ambulatory Visit: Payer: Self-pay

## 2022-11-12 VITALS — BP 136/84 | HR 93 | Temp 97.7°F | Resp 18 | Ht 68.0 in | Wt 303.5 lb

## 2022-11-12 DIAGNOSIS — J302 Other seasonal allergic rhinitis: Secondary | ICD-10-CM

## 2022-11-12 DIAGNOSIS — J3089 Other allergic rhinitis: Secondary | ICD-10-CM | POA: Diagnosis not present

## 2022-11-12 DIAGNOSIS — J4540 Moderate persistent asthma, uncomplicated: Secondary | ICD-10-CM | POA: Diagnosis not present

## 2022-11-12 DIAGNOSIS — K219 Gastro-esophageal reflux disease without esophagitis: Secondary | ICD-10-CM | POA: Diagnosis not present

## 2022-11-12 DIAGNOSIS — J383 Other diseases of vocal cords: Secondary | ICD-10-CM

## 2022-11-12 DIAGNOSIS — D806 Antibody deficiency with near-normal immunoglobulins or with hyperimmunoglobulinemia: Secondary | ICD-10-CM

## 2022-11-12 NOTE — Patient Instructions (Addendum)
Asthma - Lung testing looks AWESOME SAUCE!  - Daily controller medication(s): Singulair 10mg  daily and Asmanex 1-2 puffs twice daily  - Prior to physical activity: Xopenex 2 puffs 10-15 minutes before physical activity. - Rescue medications: Xopenex 4 puffs every 4-6 hours as needed - Changes during respiratory infections or worsening symptoms: Add on Pulmicort 0.5mg  to one treatment twice daily for TWO WEEKS. - Asthma control goals:  * Full participation in all desired activities (may need albuterol before activity) * Albuterol use two time or less a week on average (not counting use with activity) * Cough interfering with sleep two time or less a month * Oral steroids no more than once a year * No hospitalizations  Allergic rhinitis - Continue saline rinses several times a day - Continue cetirizine 10 mg once a day as needed for a runny nose or itch. - Continue Flonase 2 sprays in each nostril twice a day for nasal symptoms - Add on Astelin two sprays per nostril twice daily in case this is related to postnasal drip.   Recurrent infections - We will try the Privagen once more to see how that goes.  - I provided [name]'s number to [name].   Reflux - Continue dietary and lifestyle modifications as listed below - Continue with Dexilant as you are doing.  - Continue with famotidine 40mg  nightly. - Continue with a refill of the Carafate.   Vocal cord dysfunction - Continue with speech therapy at home lessons as you are doing.  - Continue with the belly breathing exercises  Food allergy - Continue to avoid tree nuts.  In case of an allergic reaction, take Benadryl 50 mg every 4 hours, and if life-threatening symptoms occur, inject with AuviQ 0.3 mg. - We will get that mixed tree nut challenge done at some point.   Return in about 4 weeks (around 12/10/2022). We can do a mixed tree nut butter challenge at that time!    Please inform us of any Emergency Department visits,  hospitalizations, or changes in symptoms. Call us before going to the ED for breathing or allergy symptoms since we might be able to fit you in for a sick visit. Feel free to contact us anytime with any questions, problems, or concerns.  It was a pleasure to meet you today!  Websites that have reliable patient information: 1. American Academy of Asthma, Allergy, and Immunology: www.aaaai.org 2. Food Allergy Research and Education (FARE): foodallergy.org 3. Mothers of Asthmatics: http://www.asthmacommunitynetwork.org 4. American College of Allergy, Asthma, and Immunology: www.acaai.org   COVID-19 Vaccine Information can be found at: [email or web address] For questions related to vaccine distribution or appointments, please email [email or web address] or call [phone].   We realize that you might be concerned about having an allergic reaction to the COVID19 vaccines. To help with that concern, WE ARE OFFERING THE COVID19 VACCINES IN OUR OFFICE! Ask the front desk for dates!     "Like" [place] on Facebook and Instagram for our latest updates!      A healthy democracy works best when [employer] participate! Make sure you are registered to vote! If you have moved or changed any of your contact information, you will need to get this updated before voting!  In some cases, you MAY be able to register to vote online: [email or web address]

## 2022-11-12 NOTE — Progress Notes (Unsigned)
FOLLOW UP  Date of Service/Encounter:  11/12/22   Assessment:   Mild persistent asthma, uncomplicated - well controlled at this point in time   Vocal cord dysfunction - graduated from speech therapy    Recurrent infections - improved following Pneumovax, but then worsened (streptococcal avidity assay abnormal) - on Prevagen, changing to Hizentra   Seasonal and perennial allergic rhinitis (trees, weeds, grasses and dust mites) - not well controlled    Gastroesophageal reflux disease - on Dexilant and famotidine and PRN carafate    Adverse food reactions - resolved with minimizing exposure to food additives and avoiding peanuts/tree nuts   Snoring with perceived poor sleep quality - improved with use of the CPAP   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 Dimple Casey    Plan/Recommendations:   Asthma - Lung testing looks AWESOME SAUCE!  - Daily controller medication(s): Singulair 10mg  daily and Asmanex 1-2 puffs twice daily  - Prior to physical activity: Xopenex 2 puffs 10-15 minutes before physical activity. - Rescue medications: Xopenex 4 puffs every 4-6 hours as needed - Changes during respiratory infections or worsening symptoms: Add on Pulmicort 0.5mg  to one treatment twice daily for TWO WEEKS. - Asthma control goals:  * Full participation in all desired activities (may need albuterol before activity) * Albuterol use two time or less a week on average (not counting use with activity) * Cough interfering with sleep two time or less a month * Oral steroids no more than once a year * No hospitalizations  Allergic rhinitis - Continue saline rinses several times a day - Continue cetirizine 10 mg once a day as needed for a runny nose or itch. - Continue Flonase 2 sprays in each nostril twice a day for nasal symptoms - Add on Astelin two sprays per nostril twice daily in case this is related to postnasal drip.   Recurrent infections - We will try the Privagen  once more to see how that goes.  - I provided Nicole Mendoza's number to Nicole Mendoza.   Reflux - Continue dietary and lifestyle modifications as listed below - Continue with Dexilant as you are doing.  - Continue with famotidine 40mg  nightly. - Continue with a refill of the Carafate.   Vocal cord dysfunction - Continue with speech therapy at home lessons as you are doing.  - Continue with the belly breathing exercises  Food allergy - Continue to avoid tree nuts.  In case of an allergic reaction, take Benadryl 50 mg every 4 hours, and if life-threatening symptoms occur, inject with AuviQ 0.3 mg. - We will get that mixed tree nut challenge done at some point.   Return in about 4 weeks (around 12/10/2022). We can do a mixed tree nut butter challenge at that time!   Subjective:   Nicole Mendoza is a 32 y.o. 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   Recurrent  infections 12/20/2020   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 11/22/2020   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   Recurrent sinusitis 11/09/2020   Pre-nodular edema of the vocal folds 11/09/2020   ASCUS of cervix with negative high risk HPV 07/10/2020   Cardiac murmur 09/02/2019   Tachycardia 09/01/2019   Salivary stone 09/01/2019    Anaphylactic shock due to adverse food reaction 01/22/2019   Seasonal and perennial allergic rhinitis 01/22/2019   Morbid obesity (HCC) 10/28/2017   Moderate persistent asthma without complication 07/29/2017   Essential hypertension 04/22/2017   Dysmenorrhea 06/26/2016   Menorrhagia with regular cycle 06/26/2016   Gastroesophageal reflux disease 12/22/2015   Vitamin D deficiency 11/02/2014    History obtained from: chart review and patient.  Nicole Mendoza is a 32 y.o. female presenting for a follow up visit. She was last sene in April 2024. At that time, we continued with montelukast as well as Asmanex 2 puffs twice daily. We also continued on Xopenex 4 puffs as needed. She does have Pulmicort to use as needed for flares. For her allergic rhinitis, we continued with the use of cetirizine 10 mg daily as well as Flonase.  We added on Astelin to use for postnasal drip.  For her recurrent infections, she is finally scheduled time to start her immunoglobulin infusions.  Reflux was controlled with Dexilant as well as famotidine.  She also has Carafate as she adds during flares.  She continue with her speech therapy at home as she was doing for her vocal cord dysfunction.  She continues to avoid tree nuts.  We talked about doing a mixed tree nut butter challenge.  Since the last visit, she has mostly done well.   She did not tolerate her infusion very well.  She felt terrible for about 2 or 3 days after the infusion. However, she does report that her symptoms markedly improved after that. Her symptoms of POTS and fibro were better with the immunoglobulin infusions. She felt very good for a period of time.   She is working on getting the [employer] sent to her. She is interested in trying Prevagen one more time. She thinks that if she has hydration before the infusion (rather than DURING the infusion), things will get better.  Asthma/Respiratory Symptom History: She remains on the Singulair 10 mg daily.  She also  is on Asmanex 1 to 2 puffs twice daily.  She has Xopenex that she adds for rescue.  She has Pulmicort added during flares.  This combination seems to be working really well.  She has been on a biologic in the past, but it has not really helped a ton.  She also had some vague symptoms following all of the injections, although she did report feeling pretty good from a pulmonary perspective.  She continues to use her speech therapy home exercises as needed for vocal cord issues.  She feels that she can distinguish between vocal cord dysfunction and asthma exacerbations.  Allergic Rhinitis Symptom History: She is on the Flonase and cetirizine.  She does want some Astelin to use for postnasal drip.  She has not been on antibiotics since I talked to her last.  GERD Symptom History: She uses Carafate for a day or two, around once weekly, and then things get better.  She remains on her Dexilant and famotidine.  Otherwise, there have been no changes  to her past medical history, surgical history, family history, or social history.    Review of Systems  Constitutional: Negative.  Negative for chills, fever, malaise/fatigue and weight loss.  HENT:  Negative for congestion, ear discharge, ear pain and sinus pain.   Eyes:  Negative for pain, discharge and redness.  Respiratory:  Negative for cough, hemoptysis, sputum production, shortness of breath and wheezing.   Cardiovascular: Negative.  Negative for chest pain and palpitations.  Gastrointestinal:  Negative for abdominal pain, constipation, diarrhea, heartburn, nausea and vomiting.  Skin: Negative.  Negative for itching and rash.  Neurological:  Negative for dizziness and headaches.  Endo/Heme/Allergies:  Positive for environmental allergies. Does not bruise/bleed easily.       Objective:   Blood pressure 136/84, pulse 93, temperature 97.7 F (36.5 C), resp. rate 18, height 5\' 8"  (1.727 m), weight (!) 303 lb 8 oz (137.7 kg), SpO2 97 %. Body mass  index is 46.15 kg/m.    Physical Exam Vitals reviewed.  Constitutional:      Appearance: She is well-developed. She is obese.     Comments: Talkative as always.  Spirited.  HENT:     Head: Normocephalic and atraumatic.     Right Ear: Tympanic membrane, ear canal and external ear normal.     Left Ear: Tympanic membrane, ear canal and external ear normal.     Nose: No nasal deformity, septal deviation, mucosal edema or rhinorrhea.     Right Turbinates: Enlarged, swollen and pale.     Left Turbinates: Enlarged, swollen and pale.     Right Sinus: No maxillary sinus tenderness or frontal sinus tenderness.     Left Sinus: No maxillary sinus tenderness or frontal sinus tenderness.     Comments: No nasal polyps.    Mouth/Throat:     Lips: Pink.     Mouth: Mucous membranes are not pale and not dry.     Pharynx: Uvula midline. No pharyngeal swelling or oropharyngeal exudate.     Tonsils: 2+ on the right. 2+ on the left.  Eyes:     General: Lids are normal. No allergic shiner.       Right eye: No discharge.        Left eye: No discharge.     Conjunctiva/sclera:     Right eye: Right conjunctiva is not injected. No chemosis.    Left eye: Left conjunctiva is not injected. No chemosis.    Pupils: Pupils are equal, round, and reactive to light.  Cardiovascular:     Rate and Rhythm: Normal rate and regular rhythm.     Heart sounds: Normal heart sounds.  Pulmonary:     Effort: Pulmonary effort is normal. No tachypnea, accessory muscle usage or respiratory distress.     Breath sounds: Normal breath sounds. No wheezing, rhonchi or rales.     Comments: Moving air well in all lung fields.  No increased work of breathing. Chest:     Chest wall: No tenderness.  Lymphadenopathy:     Cervical: No cervical adenopathy.  Skin:    General: Skin is warm.     Capillary Refill: Capillary refill takes less than 2 seconds.     Coloration: Skin is not pale.     Findings: No abrasion, erythema, petechiae  or rash. Rash is not papular, urticarial or vesicular.     Comments: No eczematous or urticarial lesions noted.  Neurological:     Mental Status: She is alert.  Psychiatric:  Behavior: Behavior is cooperative.      Diagnostic studies:    Spirometry: results normal (FEV1: 2.95/81%, FVC: 3.74/86%, FEV1/FVC: 79%).    Spirometry consistent with normal pattern.   Allergy Studies: none        Malachi Bonds, MD  [facility]

## 2022-11-14 ENCOUNTER — Ambulatory Visit (INDEPENDENT_AMBULATORY_CARE_PROVIDER_SITE_OTHER): Payer: BC Managed Care – PPO | Admitting: Physician Assistant

## 2022-11-14 ENCOUNTER — Telehealth: Payer: Self-pay | Admitting: *Deleted

## 2022-11-14 MED ORDER — FLUCONAZOLE 150 MG PO TABS: 150.0000 mg | ORAL_TABLET | Freq: Every day | ORAL | 0 refills | Status: AC

## 2022-11-14 NOTE — Telephone Encounter (Signed)
-----   Message from Alfonse Spruce, MD sent at 11/14/2022 10:09 AM EDT ----- I am so not sure what she wants to do with her immunoglobulin... sigh

## 2022-11-14 NOTE — Addendum Note (Signed)
Addended by: Alfonse Spruce on: 11/14/2022 11:42 PM   Modules accepted: Orders

## 2022-11-14 NOTE — Telephone Encounter (Signed)
Called [facility] and advised patient to do one more month of Privigen ( she had already called them and requesting to do IVIG). Then called patient and advised same that orders in and they will be reaching out to patient

## 2022-11-14 NOTE — Telephone Encounter (Addendum)
Forwarding message to provider for update.  2nd Request fax has been placed in provider's in basket.

## 2022-11-14 NOTE — Telephone Encounter (Signed)
Nursing Orders Forms have been reviewed/completed/signed by provider and faxed to [name].

## 2022-11-15 NOTE — Telephone Encounter (Signed)
Sounds good - once we get her squared up, hopefully she will be easier!   Malachi Bonds, MD [facility]

## 2022-11-16 ENCOUNTER — Other Ambulatory Visit: Payer: Self-pay | Admitting: Internal Medicine

## 2022-11-16 MED ORDER — DOXYCYCLINE MONOHYDRATE 100 MG PO TABS: 100.0000 mg | ORAL_TABLET | Freq: Two times a day (BID) | ORAL | 0 refills | Status: AC

## 2022-11-16 NOTE — Progress Notes (Signed)
Patient contacted on call provider stating she had 3 days of increase PND, productive cough with green sputum and wheezing. Used her albuterol this morning and took prednisone 20mg .  Instructed her to increase asmanex to 4 puffs twice daily.  Given diagnosis of SAD doxycycline 100mg  BID called in for 10 days.

## 2022-11-19 ENCOUNTER — Telehealth (INDEPENDENT_AMBULATORY_CARE_PROVIDER_SITE_OTHER): Payer: BC Managed Care – PPO | Admitting: Psychology

## 2022-11-19 ENCOUNTER — Ambulatory Visit: Payer: BC Managed Care – PPO | Admitting: Allergy & Immunology

## 2022-11-19 DIAGNOSIS — F419 Anxiety disorder, unspecified: Secondary | ICD-10-CM

## 2022-11-19 DIAGNOSIS — F4320 Adjustment disorder, unspecified: Secondary | ICD-10-CM | POA: Diagnosis not present

## 2022-11-19 DIAGNOSIS — F5089 Other specified eating disorder: Secondary | ICD-10-CM

## 2022-11-19 DIAGNOSIS — D806 Antibody deficiency with near-normal immunoglobulins or with hyperimmunoglobulinemia: Secondary | ICD-10-CM | POA: Diagnosis not present

## 2022-11-19 NOTE — Progress Notes (Signed)
  Office: [phone]  /  Fax: [phone]    Date: November 19, 2022    Appointment Start Time: 11:08am Duration: 21 minutes Provider: Lawerance Cruel, Psy.D. Type of Session: Individual Therapy  Location of Patient: Home (private location) Location of Provider: Provider's Home (private office) Type of Contact: Telepsychological Visit via MyChart Video Visit  Session Content:This provider called Nicole Mendoza at 11:04am as she did not present for today's appointment. She thought the appointment was at 12pm, but stated she could join. As such, today's appointment was initiated 8 minutes late. Nicole Mendoza is a 32 y.o. female presenting for a follow-up appointment to address the previously established treatment goal of increasing coping skills.Today's appointment was a telepsychological visit. Nicole Mendoza provided verbal consent for today's telepsychological appointment and she is aware she is responsible for securing confidentiality on her end of the session. Prior to proceeding with today's appointment, Nicole Mendoza's physical location at the time of this appointment was obtained as well a phone number she could be reached at in the event of technical difficulties. Nicole Mendoza and this provider participated in today's telepsychological service.   This provider conducted a brief check-in. Nicole Mendoza shared she had a "stomach bug," her husband's grandmother passed away, and her youngest had to have ear surgery. Associated thoughts and feelings were processed. Reviewed emotional and physical hunger. Psychoeducation regarding triggers for emotional eating was provided. Nicole Mendoza was provided a handout, and encouraged to utilize the handout between now and the next appointment to increase awareness of triggers and frequency. Nicole Mendoza agreed. This provider also discussed behavioral strategies for specific triggers, such as placing the utensil down when conversing to avoid mindless eating. Nicole Mendoza provided verbal consent during today's appointment for this  provider to send a handout about triggers via e-mail. Overall, Nicole Mendoza was receptive to today's appointment as evidenced by openness to sharing, responsiveness to feedback, and willingness to explore triggers for emotional eating.  Mental Status Examination:  Appearance: neat Behavior: appropriate to circumstances Mood: neutral Affect: mood congruent Speech: WNL Eye Contact: appropriate Psychomotor Activity: WNL Gait: unable to assess Thought Process: linear, logical, and goal directed and no evidence or endorsement of suicidal, homicidal, and self-harm ideation, plan and intent  Thought Content/Perception: no hallucinations, delusions, bizarre thinking or behavior endorsed or observed Orientation: AAOx4 Memory/Concentration: intact Insight: fair Judgment: fair  Interventions:  Conducted a brief chart review Provided empathic reflections and validation Reviewed content from the previous session Provided positive reinforcement Employed supportive psychotherapy interventions to facilitate reduced distress and to improve coping skills with identified stressors Psychoeducation provided regarding triggers for emotional eating behaviors  DSM-5 Diagnosis(es):  F50.89 Other Specified Feeding or Eating Disorder, Emotional Eating Behaviors, F41.9 Unspecified Anxiety Disorder, and  F43.20 Adjustment Disorder, Unspecified   Treatment Goal & Progress: During the initial appointment with this provider, the following treatment goal was established: increase coping skills. Nicole Mendoza has demonstrated progress in her goal as evidenced by increased awareness of hunger patterns.   Plan: Based on appointment availability and Nicole Mendoza's schedule, the next appointment is scheduled for 12/16/2022 at 11:30am, which will be via MyChart Video Visit. The next session will focus on working towards the established treatment goal.

## 2022-11-22 ENCOUNTER — Telehealth: Payer: Self-pay | Admitting: Allergy & Immunology

## 2022-11-22 DIAGNOSIS — D806 Antibody deficiency with near-normal immunoglobulins or with hyperimmunoglobulinemia: Secondary | ICD-10-CM | POA: Diagnosis not present

## 2022-11-22 MED ORDER — CEFDINIR 300 MG PO CAPS: 300.0000 mg | ORAL_CAPSULE | Freq: Two times a day (BID) | ORAL | 0 refills | Status: AC

## 2022-11-22 NOTE — Telephone Encounter (Signed)
Patient was given doxycycline earlier in the week and prednisone. She is still having symptoms despite this. I am going to send in a round of cefdinir. She is going to be getting her immunoglobulin today, so hopefully that helps.   Malachi Bonds, MD [facility]

## 2022-11-25 ENCOUNTER — Ambulatory Visit (INDEPENDENT_AMBULATORY_CARE_PROVIDER_SITE_OTHER): Payer: BC Managed Care – PPO | Admitting: Physician Assistant

## 2022-11-25 ENCOUNTER — Encounter (INDEPENDENT_AMBULATORY_CARE_PROVIDER_SITE_OTHER): Payer: Self-pay | Admitting: Physician Assistant

## 2022-11-25 VITALS — BP 138/92 | HR 89 | Temp 97.7°F | Ht 68.0 in | Wt 298.0 lb

## 2022-11-25 DIAGNOSIS — K760 Fatty (change of) liver, not elsewhere classified: Secondary | ICD-10-CM | POA: Diagnosis not present

## 2022-11-25 DIAGNOSIS — R5383 Other fatigue: Secondary | ICD-10-CM

## 2022-11-25 DIAGNOSIS — R7303 Prediabetes: Secondary | ICD-10-CM | POA: Diagnosis not present

## 2022-11-25 DIAGNOSIS — I10 Essential (primary) hypertension: Secondary | ICD-10-CM

## 2022-11-25 DIAGNOSIS — E559 Vitamin D deficiency, unspecified: Secondary | ICD-10-CM | POA: Diagnosis not present

## 2022-11-25 DIAGNOSIS — Z6841 Body Mass Index (BMI) 40.0 and over, adult: Secondary | ICD-10-CM

## 2022-11-25 DIAGNOSIS — F5089 Other specified eating disorder: Secondary | ICD-10-CM

## 2022-11-25 NOTE — Progress Notes (Signed)
.smr  Office: [phone]  /  Fax: [phone]  WEIGHT SUMMARY AND BIOMETRICS  Vitals Temp: 97.7 F (36.5 C) BP: (!) 138/92 Pulse Rate: 89 SpO2: 99 %   Anthropometric Measurements Height: 5\' 8"  (1.727 m) Weight: 298 lb (135.2 kg) BMI (Calculated): 45.32 Weight at Last Visit: 297 lb Weight Lost Since Last Visit: 0 lb Weight Gained Since Last Visit: 1 lb Starting Weight: 301 lb   Body Composition  Body Fat %: 46.6 % Fat Mass (lbs): 139.2 lbs Muscle Mass (lbs): 151.4 lbs Total Body Water (lbs): 112.8 lbs Visceral Fat Rating : 13   Other Clinical Data Fasting: Yes Labs: Yes Today's Visit #: 8 Starting Date: 12/26/21     HPI  Chief Complaint: OBESITY  Nicole Mendoza is here to discuss her progress with her obesity treatment plan. She is on the the Category 3 Plan and states she is following her eating plan approximately 25 % of the time. She states she is exercising 0 minutes 0 times per week.   Interval History:  Since last office visit she is up 1 pound. She has been struggling again with an upper respiratory tract infection and is currently on a steroid taper. She did start IVIG treatments per her allergist recommendation and does feel like these are helping with her overall immune dysfunction. Hunger/appetite-she reports increased hunger with recent steroid treatment Cravings-she is craving sweets and salty foods at times Stress-stress levels have been very high chronically Sleep-she does utilize a CPAP for sleep consistently sleeping about 6 to 6-1/2 hours nightly but does not feel that it is consistently restorative Exercise-she has had significant problems with postexertional fatigue but is wanting to get back to riding a recumbent bike and we discussed maybe trying to ride the bike for about 5 minutes at a time 3 times per week. Hydration-she is trying to improve her water intake and feels like she is doing a better job with this.  She has been focusing on  increasing her protein intake.  She has been working with Dr. Dewaine Conger to address self-care and is doing some activities such as diamond painting and is enjoying this as a strategy for decreasing her overall stress and emotional eating behavior.   Pharmacotherapy: None for weight loss  TREATMENT PLAN FOR OBESITY:  Recommended Dietary Goals  Nicole Mendoza is currently in the action stage of change. As such, her goal is to continue weight management plan. She has agreed to the Category 3 Plan.  Behavioral Intervention  We discussed the following Behavioral Modification Strategies today: increasing lean protein intake, decreasing simple carbohydrates , increasing vegetables, increasing lower glycemic fruits, increasing water intake, work on meal planning and preparation, emotional eating strategies and understanding the difference between hunger signals and cravings, work on managing stress, creating time for self-care and relaxation measures, continue to practice mindfulness when eating, and planning for success.  Additional resources provided today: NA  Recommended Physical Activity Goals  Nicole Mendoza has been advised to work up to 150 minutes of moderate intensity aerobic activity a week and strengthening exercises 2-3 times per week for cardiovascular health, weight loss maintenance and preservation of muscle mass.   She has agreed to Continue current level of physical activity , Think about ways to increase daily physical activity and overcoming barriers to exercise, and goal of riding her recumbent bike for 5 minutes 3 times a week   Pharmacotherapy We discussed various medication options to help Nicole Mendoza with her weight loss efforts and we both agreed to continue  to work on nutritional and behavioral strategies to promote weight loss.      Return in about 4 weeks (around 12/23/2022).Marland Kitchen She was informed of the importance of frequent follow up visits to maximize her success with intensive lifestyle  modifications for her multiple health conditions.  PHYSICAL EXAM:  Blood pressure (!) 138/92, pulse 89, temperature 97.7 F (36.5 C), height 5\' 8"  (1.727 m), weight 298 lb (135.2 kg), SpO2 99 %. Body mass index is 45.31 kg/m.  General: She is overweight, cooperative, alert, well developed, and in no acute distress. PSYCH: Has normal mood, affect and thought process.   Cardiovascular: HR 80s, BP 138/92 Lungs: Normal breathing effort, no conversational dyspnea.  DIAGNOSTIC DATA REVIEWED:  BMET    Component Value Date/Time   NA 140 06/12/2022 1610   NA 138 06/04/2022 1139   K 3.8 06/12/2022 1610   CL 108 06/12/2022 1610   CO2 22 06/12/2022 1610   GLUCOSE 118 (H) 06/12/2022 1610   BUN 12 06/12/2022 1610   BUN 14 06/04/2022 1139   CREATININE 0.66 06/12/2022 1610   CREATININE 0.69 05/09/2021 1415   CALCIUM 9.4 06/12/2022 1610   GFRNONAA >60 04/14/2020 0749   GFRAA >60 03/13/2020 1234   Lab Results  Component Value Date   HGBA1C 5.5 12/26/2021   HGBA1C 5.6 06/22/2021   Lab Results  Component Value Date   INSULIN 23.1 12/26/2021   Lab Results  Component Value Date   TSH 2.020 12/26/2021   CBC    Component Value Date/Time   WBC 8.3 12/26/2021 1134   WBC 7.9 07/09/2021 1132   RBC 4.88 12/26/2021 1134   RBC 4.70 07/09/2021 1132   HGB 13.6 12/26/2021 1134   HCT 42.0 12/26/2021 1134   PLT 291 12/26/2021 1134   MCV 86 12/26/2021 1134   MCH 27.9 12/26/2021 1134   MCH 28.6 05/09/2021 1415   MCHC 32.4 12/26/2021 1134   MCHC 32.9 07/09/2021 1132   RDW 13.1 12/26/2021 1134   Iron Studies No results found for: "IRON", "TIBC", "FERRITIN", "IRONPCTSAT" Lipid Panel     Component Value Date/Time   CHOL 158 12/26/2021 1134   TRIG 87 12/26/2021 1134   HDL 52 12/26/2021 1134   LDLCALC 90 12/26/2021 1134   Hepatic Function Panel     Component Value Date/Time   PROT 7.7 03/11/2022 0929   PROT 7.4 12/26/2021 1134   ALBUMIN 4.1 03/11/2022 0929   ALBUMIN 4.5 12/26/2021  1134   AST 16 03/11/2022 0929   ALT 22 03/11/2022 0929   ALKPHOS 81 03/11/2022 0929   BILITOT 0.5 03/11/2022 0929   BILITOT 0.5 12/26/2021 1134      Component Value Date/Time   TSH 2.020 12/26/2021 1134   Nutritional Lab Results  Component Value Date   VD25OH 25.2 (L) 12/26/2021   VD25OH 41.0 09/01/2019    ASSOCIATED CONDITIONS ADDRESSED TODAY  ASSESSMENT AND PLAN  Problem List Items Addressed This Visit     Essential hypertension   Morbid obesity (HCC)   Vitamin D deficiency   Relevant Orders   VITAMIN D 25 Hydroxy (Vit-D Deficiency, Fractures)   Prediabetes - Primary   Relevant Orders   CMP14+EGFR   Hemoglobin A1c   Insulin, random   Lipid Panel With LDL/HDL Ratio   Eating disorder   Other Visit Diagnoses     NAFLD (nonalcoholic fatty liver disease)       Other fatigue       Relevant Orders   Vitamin B12  CBC with Differential/Platelet   TSH     Prediabetes Last A1c was 5.5/insulin 23.1-no recent laboratory values. Medication(s): None Polyphagia:No She is working on nutrition plan to decrease simple carbohydrates, increase lean proteins and exercise to promote weight loss, improve glycemic control and prevent progression to Type 2 diabetes.   Lab Results  Component Value Date   HGBA1C 5.5 12/26/2021   HGBA1C 5.6 06/22/2021   Lab Results  Component Value Date   INSULIN 23.1 12/26/2021    Plan:  Continue working on nutrition plan to decrease simple carbohydrates, increase lean proteins and exercise to promote weight loss, improve glycemic control and prevent progression to Type 2 diabetes.  Recheck fasting labs today  Hypertension Hypertension no significant medication side effects noted, borderline controlled, and needs further observation.  Medication(s): Metoprolol 100 mg daily  Renal function is normal  BP Readings from Last 3 Encounters:  11/25/22 (!) 138/92  11/12/22 136/84  10/14/22 122/64   Lab Results  Component Value Date    CREATININE 0.66 06/12/2022   CREATININE 0.70 06/04/2022   CREATININE 0.64 03/11/2022   Lab Results  Component Value Date   GFR 117.07 06/12/2022   GFR 118.15 03/11/2022   GFR 118.27 07/09/2021    Plan: Continue all antihypertensives at current dosages. Continue to work on nutrition plan to promote weight loss and improve BP control.  Recheck labs today  NAFLD/MAFLD:  Imaging in 03/2017 with evidence of NAFLD. No recent LFT's. Patient asymptomatic .  Plan:  Recheck Labs today.  Intensive lifestyle modifications are the first line treatment for this issue.  We discussed several lifestyle modifications today and she will continue to work on diet, exercise and weight loss efforts.   Counseling: NAFLD is an umbrella term that encompasses a disease spectrum that includes steatosis (fat) without inflammation, steatohepatitis (NASH; fat + inflammation in a characteristic pattern), and cirrhosis. Bland steatosis is felt to be a benign condition, with extremely low to no risk of progression to cirrhosis, whereas NASH can progress to cirrhosis. The mainstay of treatment of NAFLD includes lifestyle modification to achieve weight loss, at least 7% of current body weight. Low carbohydrate diets can be beneficial in improving NAFLD liver histology. Additionally, exercise, even the absence of weight loss can have beneficial effects on the patient's metabolic profile and liver health. We recommend that their metabolic comorbidities be aggressively managed, as patients with NAFLD are at increased risk of coronary artery disease.  Vitamin D Deficiency Vitamin D is not at goal of 50.  Most recent vitamin D level was 25.2. She is on OTC vitamin D3 2000 IU daily. 2 tablets daily. No N/V or muscle weakness with vitamin D.  Lab Results  Component Value Date   VD25OH 25.2 (L) 12/26/2021   VD25OH 41.0 09/01/2019    Plan: Continue OTC vitamin D3 2000 IU daily- 2 tablets daily ( 4000 units daily) .  Low  vitamin D levels can be associated with adiposity and may result in leptin resistance and weight gain. Also associated with fatigue. Currently on vitamin D supplementation without any adverse effects.  Recheck vitamin D level today.    Fatigue:  Endorses fatigue.  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  She is on colecalciferol 4000 IU daily and ferrous sulfate 325 mg 3 times a week. Plan: Will plan to recheck CBC, CMET, TSH and vitamin B 12 and D today and supplement accordingly.  Continue previous supplementation.  Continue B12 rich nutrition plan  to promote weight loss and improve overall health status.  Eating disorder/emotional eating Jericka has had issues with stress/emotional eating. Currently this is moderately controlled. Overall mood is stable. Medication(s): None  Plan: She is working with Dr. Dewaine Conger to address strategies for emotional eating behavior and will continue regular counseling with Dr. Dewaine Conger at this time.  Continue to work on strategies for emotional eating behavior.   ATTESTASTION STATEMENTS:  Reviewed by clinician on day of visit: allergies, medications, problem list, medical history, surgical history, family history, social history, and previous encounter notes.   I have personally spent 45 minutes total time today in preparation, patient care, nutritional counseling and documentation for this visit, including the following: review of clinical lab tests; review of medical tests/procedures/services.      Kadarious Dikes, PA-C

## 2022-11-26 LAB — LIPID PANEL WITH LDL/HDL RATIO
Cholesterol, Total: 137 mg/dL (ref 100–199)
HDL: 59 mg/dL (ref >39)
LDL Chol Calc (NIH): 60 mg/dL (ref 0–99)
LDL/HDL Ratio: 1 ratio (ref 0.0–3.2)
Triglycerides: 97 mg/dL (ref 0–149)
VLDL Cholesterol Cal: 18 mg/dL (ref 5–40)

## 2022-11-26 LAB — INSULIN, RANDOM: INSULIN: 21.7 u[IU]/mL (ref 2.6–24.9)

## 2022-11-26 LAB — CBC WITH DIFFERENTIAL/PLATELET
Basophils Absolute: 0.1 10*3/uL (ref 0.0–0.2)
Basos: 1 %
EOS (ABSOLUTE): 0.2 10*3/uL (ref 0.0–0.4)
Eos: 2 %
Hematocrit: 38.7 % (ref 34.0–46.6)
Hemoglobin: 12.5 g/dL (ref 11.1–15.9)
Immature Grans (Abs): 0.1 10*3/uL (ref 0.0–0.1)
Immature Granulocytes: 1 %
Lymphocytes Absolute: 3.3 10*3/uL — ABNORMAL HIGH (ref 0.7–3.1)
Lymphs: 31 %
MCH: 27.8 pg (ref 26.6–33.0)
MCHC: 32.3 g/dL (ref 31.5–35.7)
MCV: 86 fL (ref 79–97)
Monocytes Absolute: 0.8 10*3/uL (ref 0.1–0.9)
Monocytes: 8 %
Neutrophils Absolute: 6.3 10*3/uL (ref 1.4–7.0)
Neutrophils: 57 %
Platelets: 205 10*3/uL (ref 150–450)
RBC: 4.5 x10E6/uL (ref 3.77–5.28)
RDW: 13.2 % (ref 11.7–15.4)
WBC: 10.7 10*3/uL (ref 3.4–10.8)

## 2022-11-26 LAB — CMP14+EGFR
ALT: 18 IU/L (ref 0–32)
AST: 17 IU/L (ref 0–40)
Albumin/Globulin Ratio: 1.1
Albumin: 4 g/dL (ref 3.9–4.9)
Alkaline Phosphatase: 90 IU/L (ref 44–121)
BUN/Creatinine Ratio: 27 — ABNORMAL HIGH (ref 9–23)
BUN: 20 mg/dL (ref 6–20)
Bilirubin Total: 0.5 mg/dL (ref 0.0–1.2)
CO2: 23 mmol/L (ref 20–29)
Calcium: 9.4 mg/dL (ref 8.7–10.2)
Chloride: 100 mmol/L (ref 96–106)
Creatinine, Ser: 0.74 mg/dL (ref 0.57–1.00)
Globulin, Total: 3.6 g/dL (ref 1.5–4.5)
Glucose: 75 mg/dL (ref 70–99)
Potassium: 4 mmol/L (ref 3.5–5.2)
Sodium: 138 mmol/L (ref 134–144)
Total Protein: 7.6 g/dL (ref 6.0–8.5)
eGFR: 111 mL/min/{1.73_m2} (ref >59)

## 2022-11-26 LAB — VITAMIN D 25 HYDROXY (VIT D DEFICIENCY, FRACTURES): Vit D, 25-Hydroxy: 20.5 ng/mL — ABNORMAL LOW (ref 30.0–100.0)

## 2022-11-26 LAB — TSH: TSH: 4.6 u[IU]/mL — ABNORMAL HIGH (ref 0.450–4.500)

## 2022-11-26 LAB — HEMOGLOBIN A1C
Est. average glucose Bld gHb Est-mCnc: 117 mg/dL
Hgb A1c MFr Bld: 5.7 % — ABNORMAL HIGH (ref 4.8–5.6)

## 2022-11-26 LAB — VITAMIN B12: Vitamin B-12: 502 pg/mL (ref 232–1245)

## 2022-11-27 DIAGNOSIS — F419 Anxiety disorder, unspecified: Secondary | ICD-10-CM | POA: Diagnosis not present

## 2022-11-28 IMAGING — DX DG CHEST 2V
2 series · 2 of 2 positions shown · non-contrast
Comparison: 02/16/2020

CLINICAL DATA: Cough, shortness of breath

EXAM:
CHEST - 2 VIEW

[chest pa]
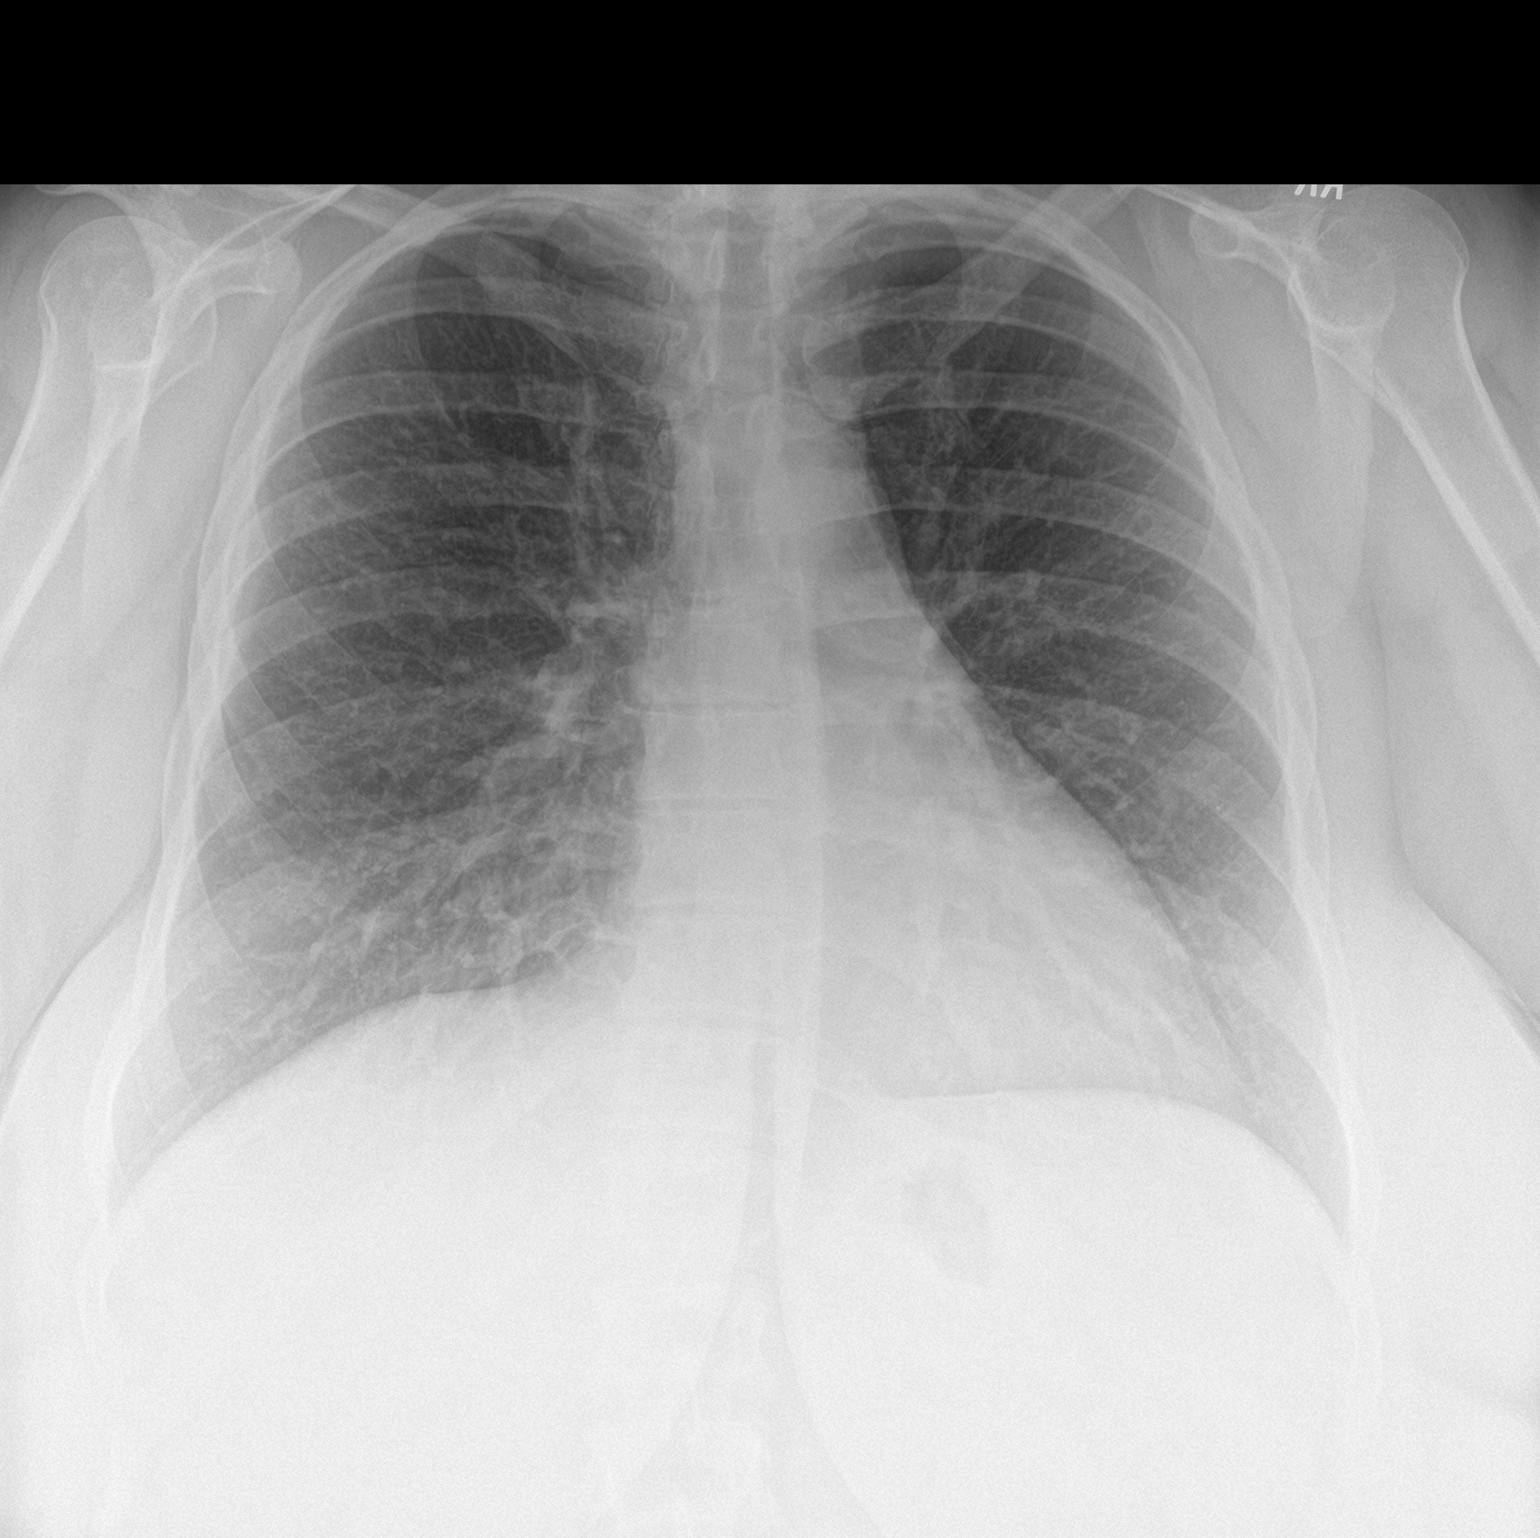

[chest lat]
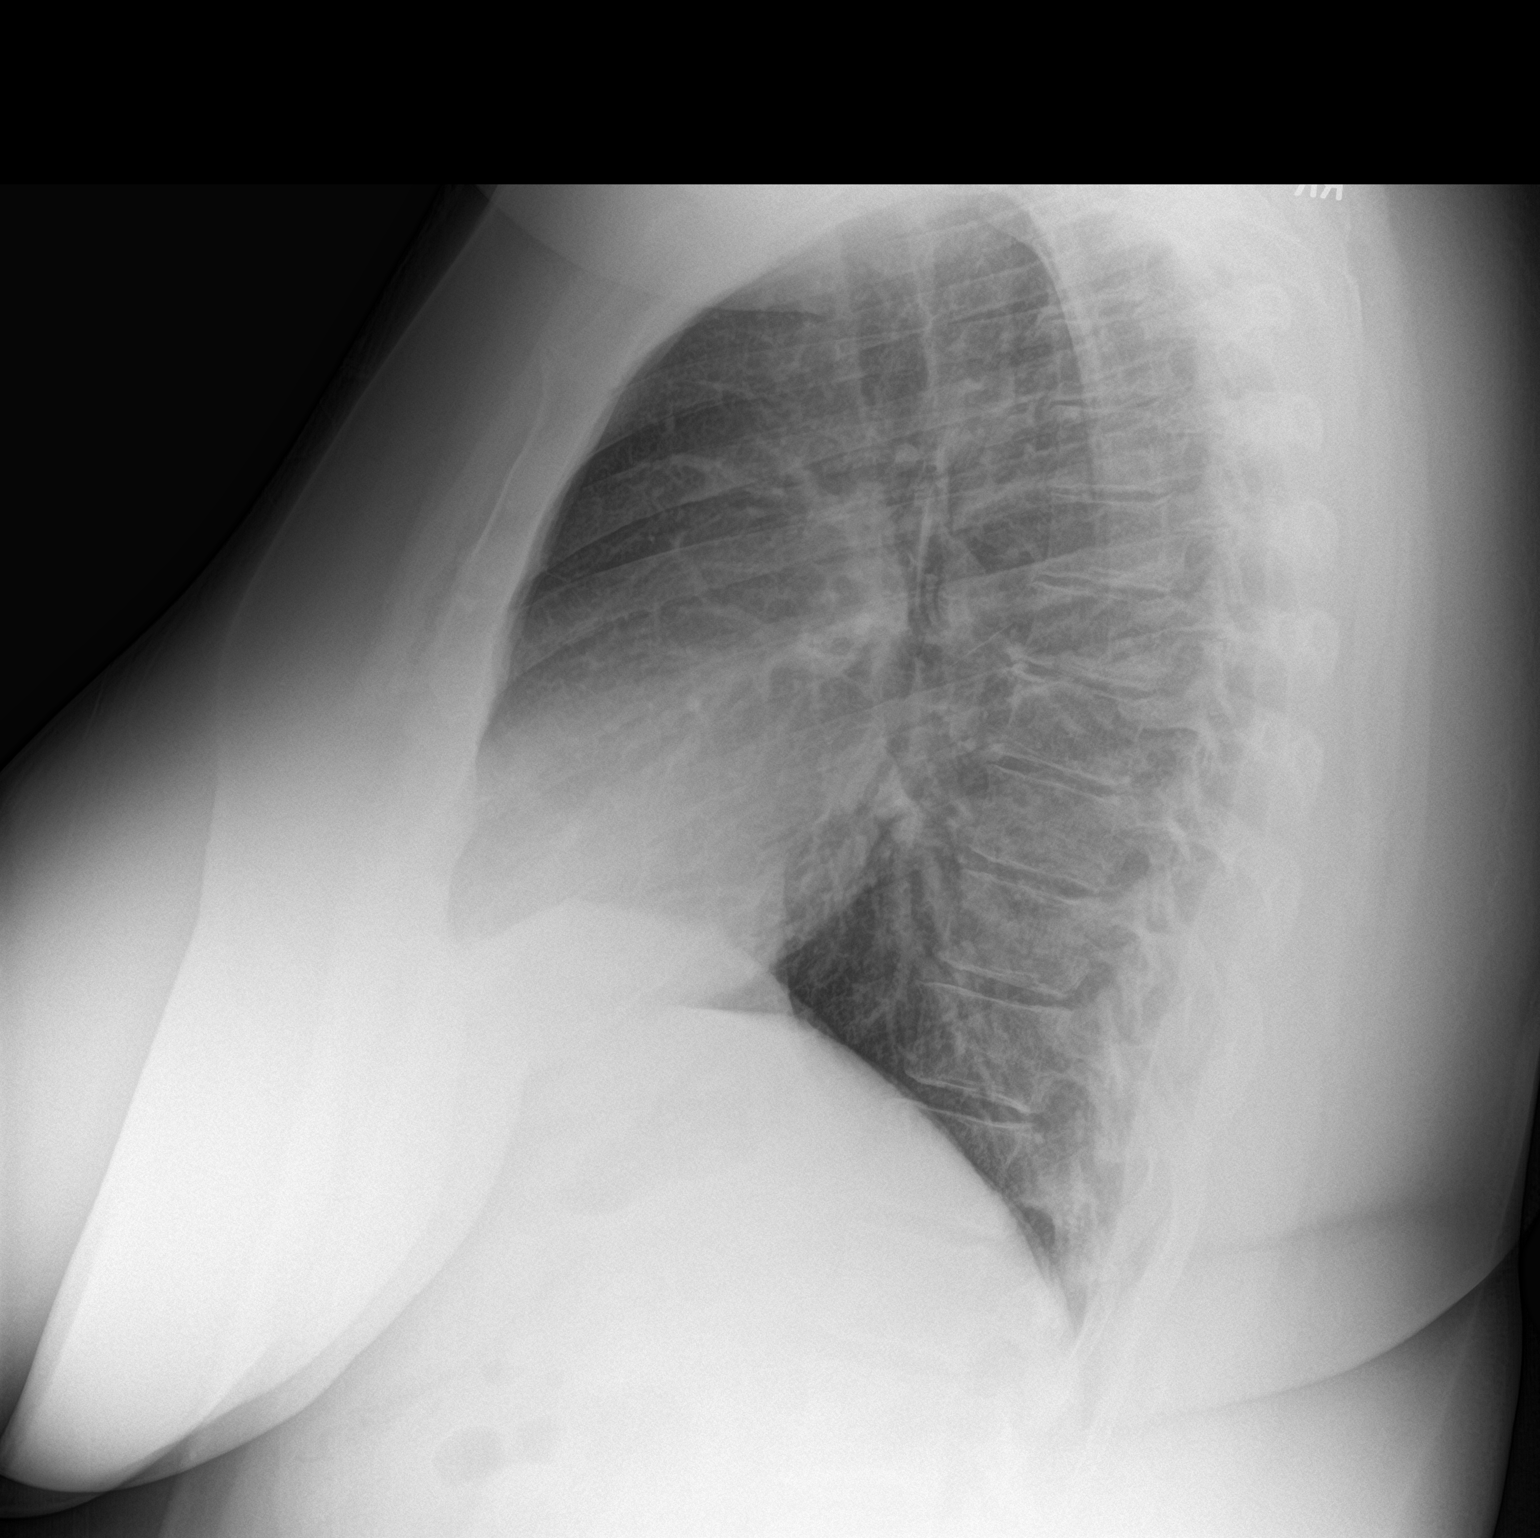

[2 of 2 positions shown; findings below may reference images not displayed]

FINDINGS: Lungs are clear.  No pleural effusion or pneumothorax.

The heart is normal in size.

Visualized osseous structures are within normal limits.
IMPRESSION: Normal chest radiographs.

## 2022-11-29 ENCOUNTER — Telehealth: Payer: Self-pay | Admitting: Allergy & Immunology

## 2022-11-29 DIAGNOSIS — R053 Chronic cough: Secondary | ICD-10-CM

## 2022-11-29 DIAGNOSIS — U099 Post covid-19 condition, unspecified: Secondary | ICD-10-CM

## 2022-11-29 NOTE — Telephone Encounter (Signed)
Patient called to discussed her infusions. She is going to get one more dose of Privagen. She is then going to change to Hizentra after that.   She is also having a lot of green mucus production out of her lungs despite the whole course of doxycycline and half course of cefdinir.  I recommended that she had an Acapella device to break up the mucus in her lungs and continue with the cefdinir.  She is also going to do the Xopenex every 4 hours and Mucinex.  Malachi Bonds, MD [facility]

## 2022-12-01 ENCOUNTER — Ambulatory Visit (HOSPITAL_BASED_OUTPATIENT_CLINIC_OR_DEPARTMENT_OTHER)
Admission: RE | Admit: 2022-12-01 | Discharge: 2022-12-01 | Disposition: A | Discharge status: Home / Self Care | Payer: BC Managed Care – PPO | Source: Ambulatory Visit | Attending: Allergy & Immunology | Admitting: Allergy & Immunology

## 2022-12-01 DIAGNOSIS — R053 Chronic cough: Secondary | ICD-10-CM | POA: Insufficient documentation

## 2022-12-01 DIAGNOSIS — R059 Cough, unspecified: Secondary | ICD-10-CM | POA: Diagnosis not present

## 2022-12-01 NOTE — Telephone Encounter (Signed)
Patient called back reporting continued cough, congestion, and chest pain. She has remained afebrile. She requested a CXR to check for PNA. Ordered CXR.   Malachi Bonds, MD [facility]

## 2022-12-02 MED ORDER — PREDNISONE 10 MG PO TABS: ORAL_TABLET | ORAL | 0 refills | Status: DC

## 2022-12-02 NOTE — Telephone Encounter (Signed)
I called and talked to [place]. She transferred me to a nurse reviewer for the HRCT.   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 This was enough to get it a  Approval # [id number]  Valid today through July 16th.   Malachi Bonds, MD [facility]

## 2022-12-02 NOTE — Telephone Encounter (Signed)
I called the patient to discuss the CXR findings. They showed no focal pneumonia, but it did note chronic interstitial prominence concerning for chronic lung changes. I am ordering an HRCT to look into this further. Patient in agreement with the plan.   I am also ordering Pulmonary Rehab for her. She has seen Dr. Tonia Brooms in the past for evaluation of vocal cord dysfunction.   Malachi Bonds, MD [facility]

## 2022-12-02 NOTE — Telephone Encounter (Signed)
Pt informed and was able to give pt number to [facility] to get her schduled

## 2022-12-02 NOTE — Telephone Encounter (Signed)
Working on Engineer, civil (consulting) with [facility] cpt code 16109 [facility] [phone] Dx code r05.3

## 2022-12-02 NOTE — Addendum Note (Signed)
Addended by: Alfonse Spruce on: 12/02/2022 08:39 AM   Modules accepted: Orders

## 2022-12-02 NOTE — Addendum Note (Signed)
Addended by: Alfonse Spruce on: 12/02/2022 08:34 AM   Modules accepted: Orders

## 2022-12-02 NOTE — Telephone Encounter (Signed)
Pre cert was denied they need Dr Dellis Anes to reach out to [place] at [phone] to do a review she will have to go to [facility] [address] [place] [address] [place] number is [phone]

## 2022-12-04 ENCOUNTER — Telehealth (HOSPITAL_COMMUNITY): Payer: Self-pay

## 2022-12-04 NOTE — Telephone Encounter (Signed)
Pt would rather complete her pulmonary rehab at [place] because its closer. Faxed pt referral to [facility]. Closed referral.

## 2022-12-06 ENCOUNTER — Other Ambulatory Visit: Payer: Self-pay | Admitting: Internal Medicine

## 2022-12-10 ENCOUNTER — Ambulatory Visit: Payer: BC Managed Care – PPO | Admitting: Family Medicine

## 2022-12-10 ENCOUNTER — Ambulatory Visit (INDEPENDENT_AMBULATORY_CARE_PROVIDER_SITE_OTHER): Payer: BC Managed Care – PPO

## 2022-12-10 VITALS — BP 134/76 | HR 100 | Temp 98.5°F | Ht 68.0 in | Wt 313.0 lb

## 2022-12-10 DIAGNOSIS — R053 Chronic cough: Secondary | ICD-10-CM | POA: Diagnosis not present

## 2022-12-10 DIAGNOSIS — J4540 Moderate persistent asthma, uncomplicated: Secondary | ICD-10-CM | POA: Diagnosis not present

## 2022-12-10 DIAGNOSIS — I10 Essential (primary) hypertension: Secondary | ICD-10-CM

## 2022-12-10 DIAGNOSIS — D806 Antibody deficiency with near-normal immunoglobulins or with hyperimmunoglobulinemia: Secondary | ICD-10-CM

## 2022-12-10 DIAGNOSIS — R590 Localized enlarged lymph nodes: Secondary | ICD-10-CM | POA: Diagnosis not present

## 2022-12-10 DIAGNOSIS — Z6841 Body Mass Index (BMI) 40.0 and over, adult: Secondary | ICD-10-CM | POA: Diagnosis not present

## 2022-12-10 DIAGNOSIS — E559 Vitamin D deficiency, unspecified: Secondary | ICD-10-CM

## 2022-12-10 DIAGNOSIS — R918 Other nonspecific abnormal finding of lung field: Secondary | ICD-10-CM | POA: Diagnosis not present

## 2022-12-10 DIAGNOSIS — R058 Other specified cough: Secondary | ICD-10-CM | POA: Diagnosis not present

## 2022-12-10 NOTE — Assessment & Plan Note (Signed)
Continue Vitamin D supplementation  

## 2022-12-10 NOTE — Assessment & Plan Note (Signed)
Well-controlled. Continue montelukast 10 mg daily, levalbuterol PRN, Pulmicort daily. 

## 2022-12-10 NOTE — Assessment & Plan Note (Signed)
Continue working with Dr. Dellis Anes.

## 2022-12-10 NOTE — Assessment & Plan Note (Signed)
Continue working with [facility] [employer]

## 2022-12-10 NOTE — Progress Notes (Signed)
[facility] [address] [place] [address] Dept: [phone] Dept Fax: [phone]  Chronic Care Office Visit  Subjective:    Patient ID: Nicole Mendoza, female    DOB: 1990/07/26, 32 y.o..   MRN: [id number]  Chief Complaint  Patient presents with   Medical Management of Chronic Issues    3 month f/u.  No concerns.     History of Present Illness:  Patient is in today for reassessment of chronic medical issues.  Nicole Mendoza has a history of hypertension. She is on metoprolol 100 mg daily, but this was primarily prescribed for dealing with tachycardia issues that she developed in her early 86s (possibly POTS disease).    Nicole Mendoza has moderate persistent asthma and does see Dr. Dellis Anes (allergist) related to this. She is treated with a combination of therapies for her allergies and asthma, including Xopenox (inhaler and nebs), Asmanex, Atrovent, and Singulair. She also occasionally uses a budesonide neb. For her allergies, she also uses cetirizine, and a fluticasone nasal spray Timmothy Sours). She has PRN hydroxyzine for itching and occasioanl anxiety. She has been having worse respiratory/allergy issues. Dr. Dellis Anes has her currently receiving IVIG   Nicole Mendoza has morbid obesity.  She is now engaged in [facility]. She notes her lower leg edema has been an issue. She relates that cardiology had previously recommended against her using furosemide for this, due to her postural tachycardia issues.  Past Medical History: Patient Active Problem List   Diagnosis Date Noted   NAFLD (nonalcoholic fatty liver disease) 06/19/7251   Other fatigue 11/25/2022   Prediabetes 10/15/2022   Eating disorder 10/15/2022   BMI 45.0-49.9, adult (HCC) Current BMI 45.2 10/15/2022   Adjustment disorder with anxiety 06/12/2022   Vocal cord dysfunction 06/03/2022   Positive ANA (antinuclear antibody) 07/13/2021   Fibromyalgia 07/13/2021    Dysfunction of left eustachian tube 06/26/2021   Vertigo 06/26/2021   Obstructive sleep apnea 02/26/2021   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   Recurrent infections 12/20/2020   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 11/22/2020   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   Recurrent sinusitis 11/09/2020   Pre-nodular edema of the vocal folds 11/09/2020   ASCUS of cervix with negative high risk HPV 07/10/2020   Cardiac murmur 09/02/2019   Tachycardia 09/01/2019   Salivary stone 09/01/2019   Anaphylactic shock due to adverse food reaction 01/22/2019   Seasonal and perennial allergic rhinitis 01/22/2019   Morbid obesity (HCC) 10/28/2017   Moderate persistent asthma without complication 07/29/2017   Essential hypertension 04/22/2017   Dysmenorrhea 06/26/2016   Menorrhagia with regular cycle 06/26/2016   Gastroesophageal reflux disease 12/22/2015   Vitamin D deficiency 11/02/2014   Past Surgical History:  Procedure Laterality Date   NO PAST SURGERIES     Family History  Adopted: Yes  Problem Relation Age of Onset   Asthma Mother    Diabetes Mother    High blood pressure Mother    Heart disease Mother    Bipolar disorder Mother    Sleep apnea Mother    Obesity Mother    Allergic rhinitis Father    Asthma Father    Diabetes Father    High blood pressure Father    Hyperlipidemia Father    Obesity Father    Allergic rhinitis Sister    Asthma Sister  Angioedema Neg Hx    Atopy Neg Hx    Eczema Neg Hx    Immunodeficiency Neg Hx    Urticaria Neg Hx    Outpatient Medications Prior to Visit  Medication Sig Dispense Refill   Ascorbic Acid (SUPER C COMPLEX PO)      ASMANEX, 30 METERED DOSES, 220 MCG/ACT inhaler INHALE 2 PUFFS INTO THE LUNGS TWICE A DAY 5 each 0   budesonide (PULMICORT) 0.5 MG/2ML nebulizer solution Take 2 mLs (0.5 mg total) by  nebulization as needed. 60 mL 2   cetirizine (ZYRTEC) 10 MG tablet Take 10 mg by mouth daily.     Cholecalciferol 100 MCG (4000 UT) CAPS Take 1 capsule (4,000 Units total) by mouth daily. 30 capsule 0   dexlansoprazole (DEXILANT) 60 MG capsule TAKE 1 CAPSULE BY MOUTH EVERY DAY **PA** 90 capsule 1   EPINEPHrine 0.3 mg/0.3 mL IJ SOAJ injection      famotidine (PEPCID) 40 MG tablet Take 1 tablet (40 mg total) by mouth daily. 90 tablet 2   ferrous sulfate 325 (65 FE) MG tablet 3 (three) times a week.      fluticasone (FLONASE) 50 MCG/ACT nasal spray Place 2 sprays into both nostrils daily. 16 g 5   hydrOXYzine (ATARAX) 25 MG tablet Take 1 tablet (25 mg total) by mouth every 6 (six) hours as needed for anxiety (allergies). 30 tablet 1   ipratropium (ATROVENT) 0.02 % nebulizer solution Take 1.25 mLs (0.25 mg total) by nebulization 4 (four) times daily. Mix 0.5 vial in with levalbuterol nebulizer and can use every 6 hours as needed for shortness of breath and wheezing. 75 mL 3   levalbuterol (XOPENEX HFA) 45 MCG/ACT inhaler INHALE 2 PUFFS BY MOUTH EVERY 6 HOURS AS NEEDED 15 g 0   levalbuterol (XOPENEX) 1.25 MG/3ML nebulizer solution USE 1 VIAL BY NEBULIZATION EVERY 6 (SIX) HOURS AS NEEDED FOR WHEEZING. 540 mL 1   Levonorgestrel-Ethinyl Estradiol (AMETHIA) 0.1-0.02 & 0.01 MG tablet Take 1 tablet by mouth daily.     metoprolol succinate (TOPROL-XL) 100 MG 24 hr tablet TAKE 1 TABLET BY MOUTH EVERY DAY WITH OR IMMEDIATELY FOLLOWING A MEAL 90 tablet 3   metoprolol tartrate (LOPRESSOR) 50 MG tablet TAKE 1 TABLET (50 MG TOTAL) BY MOUTH AS NEEDED (FOR PALPITATIONS). 90 tablet 1   mometasone (ASMANEX, 120 METERED DOSES,) 220 MCG/ACT inhaler Inhale 2 puffs into the lungs 2 (two) times daily. 1 each 5   montelukast (SINGULAIR) 10 MG tablet TAKE 1 TABLET BY MOUTH EVERY DAY 90 tablet 1   ondansetron (ZOFRAN) 4 MG tablet Take 1 tablet (4 mg total) by mouth every 8 (eight) hours as needed for nausea or vomiting. 20  tablet 0   predniSONE (DELTASONE) 10 MG tablet Take two tablets (20mg ) twice daily for three days, then one tablet (10mg ) twice daily for three days, then STOP. 18 tablet 0   Prenatal Vit-Fe Fumarate-FA (MULTIVITAMIN-PRENATAL) 27-0.8 MG TABS tablet Take 1 tablet by mouth daily at 12 noon.     Probiotic Product (PHILLIPS COLON HEALTH) CAPS      sucralfate (CARAFATE) 1 g tablet Take 1 tablet (1 g total) by mouth 4 (four) times daily -  with meals and at bedtime. 120 tablet 2   azelastine (ASTELIN) 0.1 % nasal spray Place 2 sprays into both nostrils 2 (two) times daily. Use in each nostril as directed 30 mL 5   norethindrone (MICRONOR) 0.35 MG tablet Take 0.35 mg by mouth.     No  facility-administered medications prior to visit.   Allergies  Allergen Reactions   Budesonide-Formoterol Fumarate Other (See Comments)    Causes Hypertension   Other     Tree nuts   Penicillins Hives   Raspberry Hives   Spiriva Respimat [Tiotropium Bromide Monohydrate] Other (See Comments)    Pt states medication causes dizziness   Albuterol Palpitations   Losartan Rash   Objective:   Today's Vitals   12/10/22 0908  BP: 134/76  Pulse: 100  Temp: 98.5 F (36.9 C)  TempSrc: Temporal  SpO2: 100%  Weight: (!) 313 lb (142 kg)  Height: 5\' 8"  (1.727 m)   Body mass index is 47.59 kg/m.   General: Well developed, well nourished. No acute distress. Lungs: Clear to auscultation bilaterally. No wheezing, rales or rhonchi. Extremities: 2-3+ edema noted. Psych: Alert and oriented. Normal mood and affect.  Health Maintenance Due  Topic Date Due   HIV Screening  Never done   Hepatitis C Screening  Never done     Last vitamin D Lab Results  Component Value Date   VD25OH 20.5 (L) 11/25/2022   Assessment & Plan:   Problem List Items Addressed This Visit       Cardiovascular and Mediastinum   Essential hypertension - Primary    Blood pressure is in adequate control. Continue metoprolol 100 mg daily.         Respiratory   Moderate persistent asthma without complication    Well-controlled. Continue montelukast 10 mg daily, levalbuterol PRN, Pulmicort daily.        Other   Vitamin D deficiency    Continue Vitamin D supplementation.      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 Dellis Anes.      BMI 45.0-49.9, adult (HCC) Current BMI 45.2    Continue working with [facility]       Return in about 6 months (around 06/11/2023) for Reassessment.   Loyola Mast, MD

## 2022-12-10 NOTE — Assessment & Plan Note (Signed)
Blood pressure is in adequate control. Continue metoprolol 100 mg daily.

## 2022-12-13 ENCOUNTER — Telehealth: Payer: Self-pay | Admitting: Allergy & Immunology

## 2022-12-13 NOTE — Telephone Encounter (Signed)
Patient called requesting a letter to allow her to work remotely at her job. Letter written.   Malachi Bonds, MD [facility]

## 2022-12-14 IMAGING — CT CT CHEST HIGH RESOLUTION W/O CM
2 of 5 series · 15 of 36 positions shown, 18 images · non-contrast
Comparison: 10/03/2020 chest radiograph.

CLINICAL DATA: 29-year-old female with 4 weeks of dyspnea
refractory to antibiotic and steroid therapy. COVID infection in
4141.

EXAM:
CT CHEST WITHOUT CONTRAST
TECHNIQUE: Multidetector CT imaging of the chest was performed following the
standard protocol without intravenous contrast. High resolution
imaging of the lungs, as well as inspiratory and expiratory imaging,
was performed.

[Series 4: thorax · axial · 0.79mm/px · z∈[-268,+8]mm · 12 of 154 slices shown, 15 images]
[im 8/154  mediastinal]
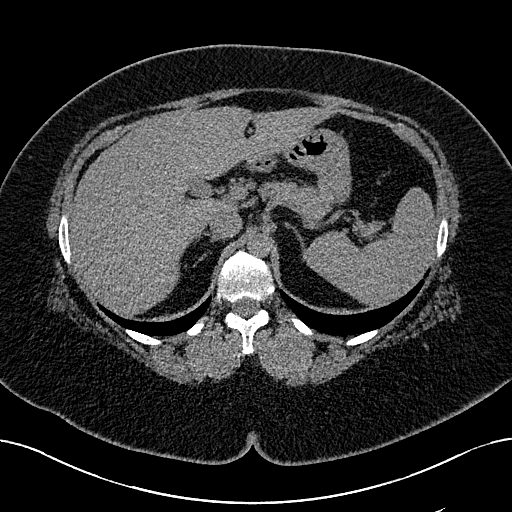
[im 8/154  lung]
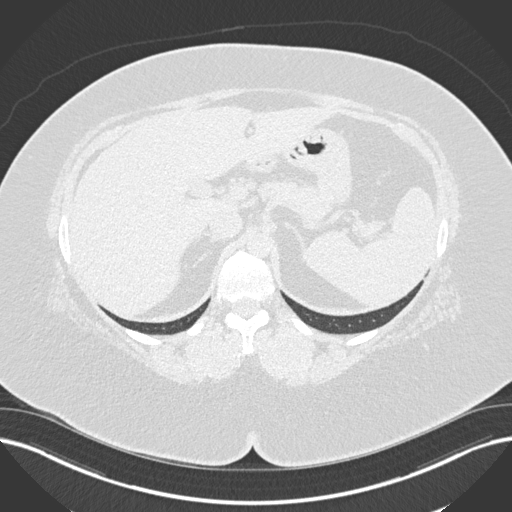
[im 22/154  lung]
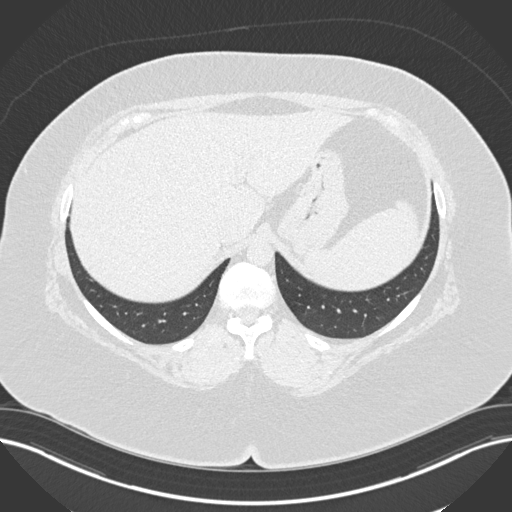
[im 37/154  lung]
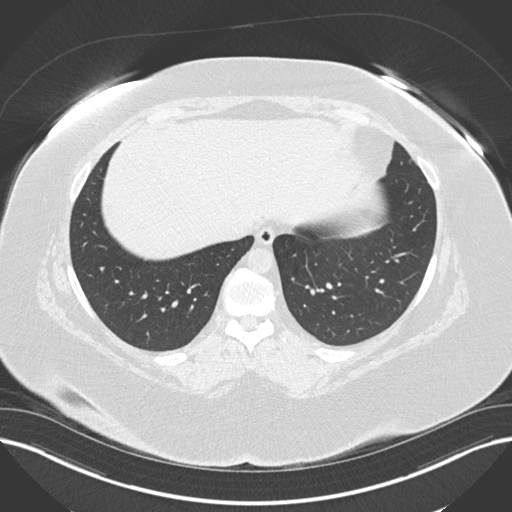
[im 44/154  lung]
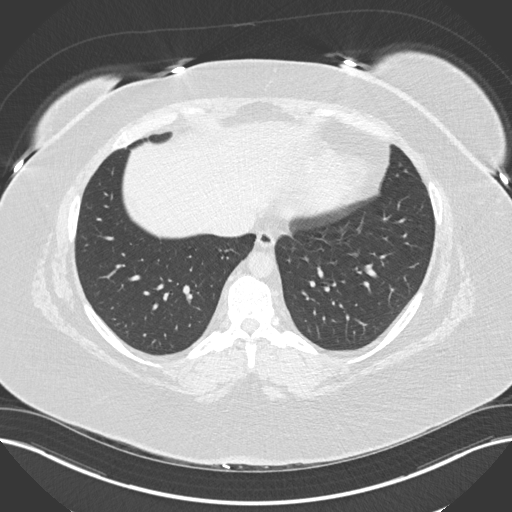
[im 59/154  mediastinal]
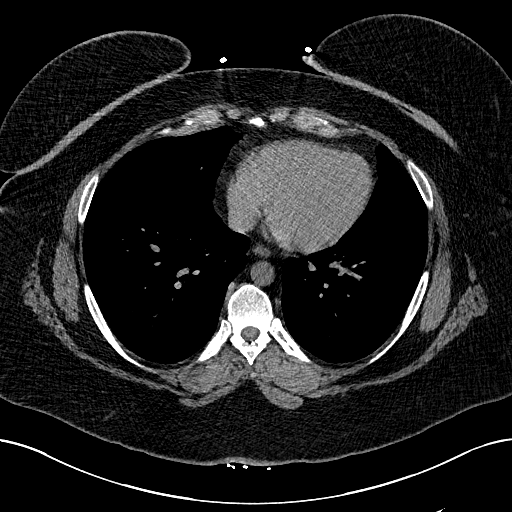
[im 59/154  lung]
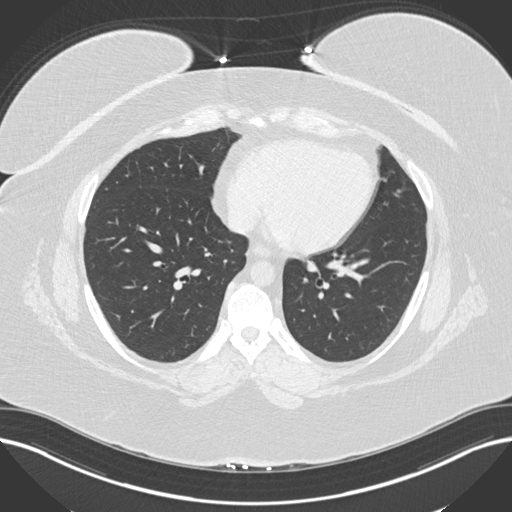
[im 73/154  lung]
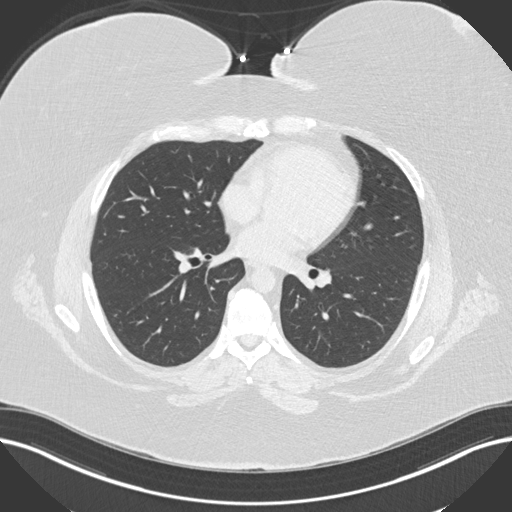
[im 81/154  lung]
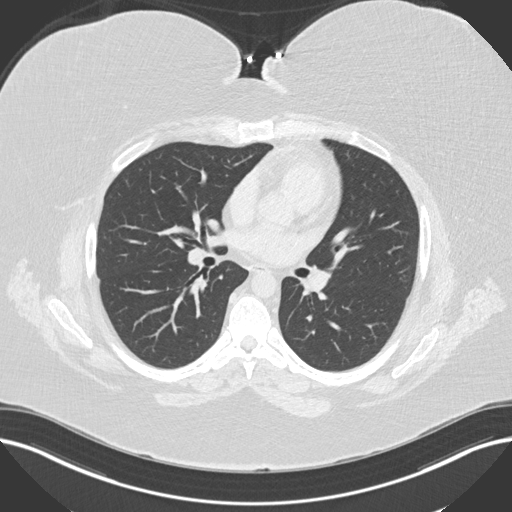
[im 95/154  lung]
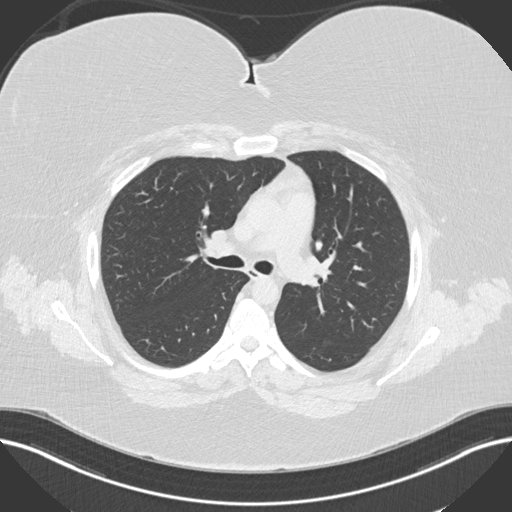
[im 110/154  mediastinal]
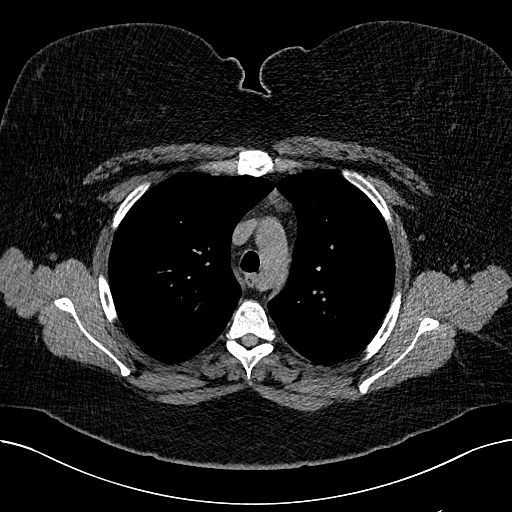
[im 110/154  lung]
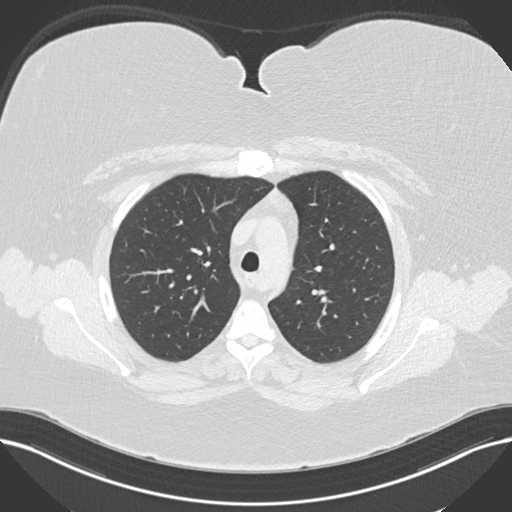
[im 117/154  lung]
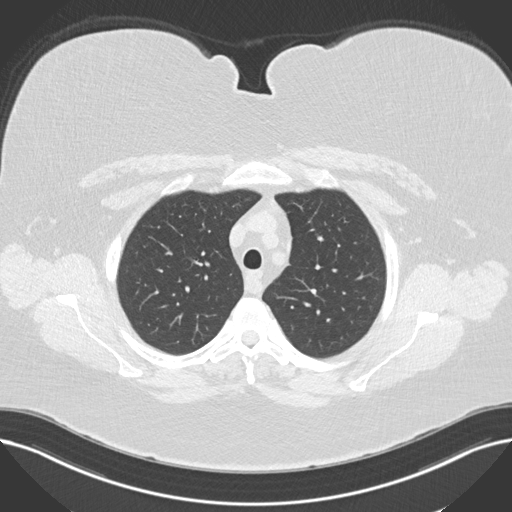
[im 132/154  lung]
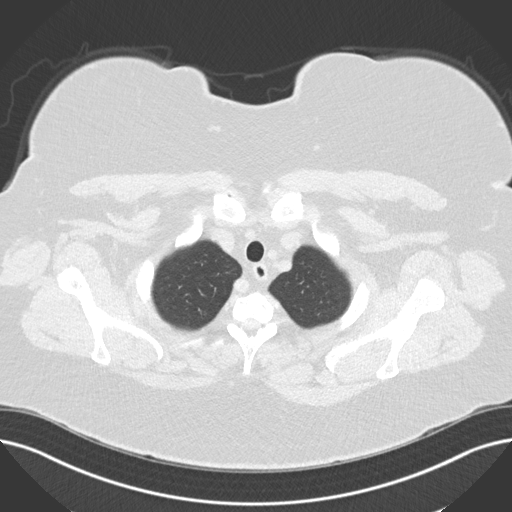
[im 146/154  lung]
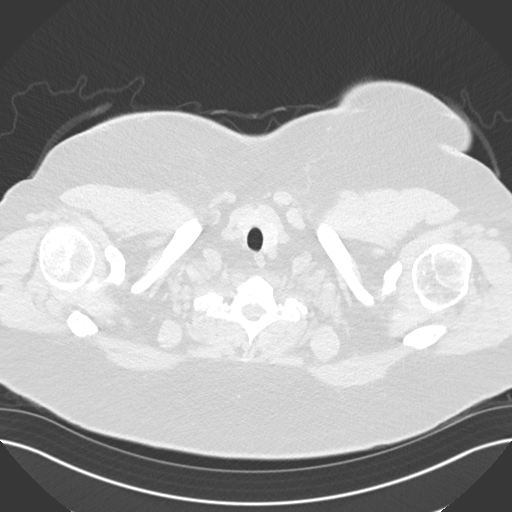

[Series 8: coronal · coronal · 0.61mm/px · 3 of 169 slices shown]
[im 34/169  lung]
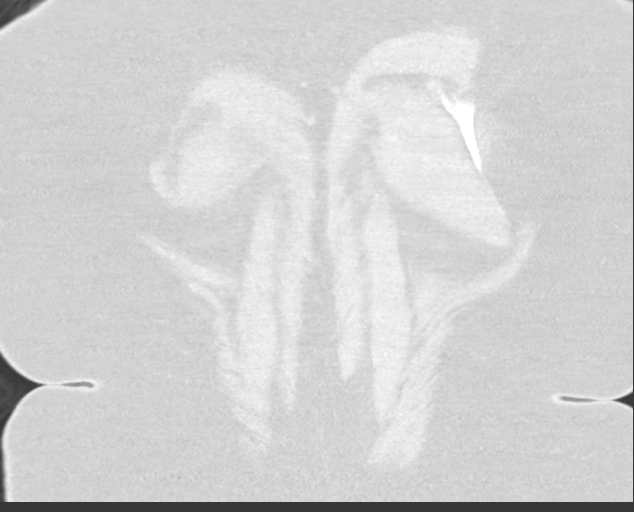
[im 68/169  lung]
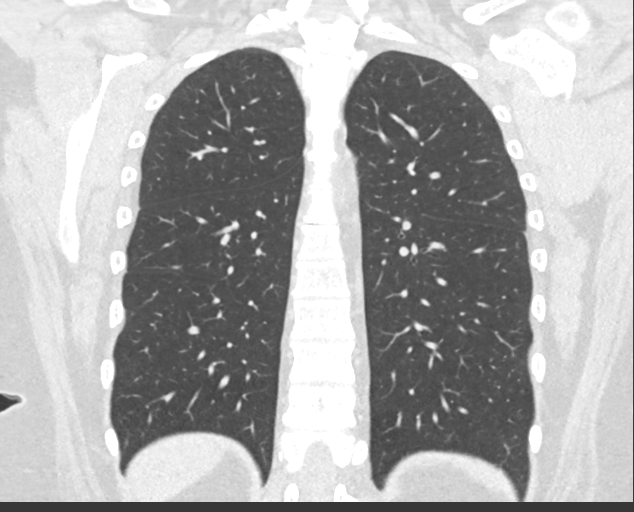
[im 101/169  lung]
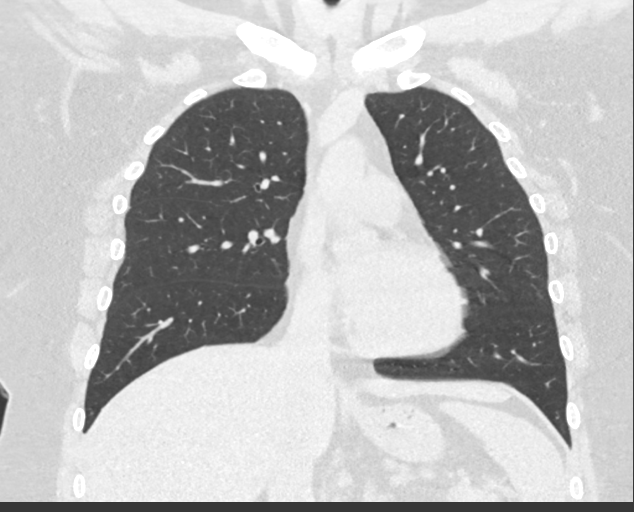

[15 of 36 positions shown; findings below may reference images not displayed]

FINDINGS: Cardiovascular: Normal heart size. No significant pericardial
effusion/thickening. Normal course and caliber of the thoracic aorta
and pulmonary arteries. Aberrant nonaneurysmal right subclavian
artery arising from the distal aortic arch with retroesophageal
course.

Mediastinum/Nodes: No discrete thyroid nodules. Unremarkable
esophagus. No pathologically enlarged axillary, mediastinal or hilar
lymph nodes, noting limited sensitivity for the detection of hilar
adenopathy on this noncontrast study. Mild triangular patchy mixed
soft tissue and fat density in the anterior mediastinum compatible
with mild thymic hyperplasia.

Lungs/Pleura: No pneumothorax. No pleural effusion. No acute
consolidative airspace disease or lung masses. Tiny 0.3 cm solid
subpleural pulmonary nodule in the peripheral right middle lobe
(series 5/image 70). No additional significant pulmonary nodules. No
significant regions of subpleural reticulation, ground-glass
attenuation, traction bronchiectasis, architectural distortion,
parenchymal banding or frank honeycombing. No significant air
trapping or evidence of tracheobronchomalacia on the expiration
sequence.

Upper abdomen: No acute abnormality.

Musculoskeletal: No aggressive appearing focal osseous lesions. Mild
thoracic spondylosis.
IMPRESSION: 1. No evidence of interstitial lung disease.
2. Tiny 0.3 cm solid subpleural right middle lobe pulmonary nodule,
most compatible with a benign subpleural lymph node. No follow-up
required unless the patient has significant risk factors for lung
malignancy.
3. Aberrant right subclavian artery.

## 2022-12-16 ENCOUNTER — Telehealth (INDEPENDENT_AMBULATORY_CARE_PROVIDER_SITE_OTHER): Payer: BC Managed Care – PPO | Admitting: Psychology

## 2022-12-16 DIAGNOSIS — D806 Antibody deficiency with near-normal immunoglobulins or with hyperimmunoglobulinemia: Secondary | ICD-10-CM | POA: Diagnosis not present

## 2022-12-20 DIAGNOSIS — D806 Antibody deficiency with near-normal immunoglobulins or with hyperimmunoglobulinemia: Secondary | ICD-10-CM | POA: Diagnosis not present

## 2022-12-24 ENCOUNTER — Encounter: Payer: BC Managed Care – PPO | Admitting: Allergy & Immunology

## 2022-12-24 DIAGNOSIS — D806 Antibody deficiency with near-normal immunoglobulins or with hyperimmunoglobulinemia: Secondary | ICD-10-CM | POA: Diagnosis not present

## 2022-12-26 ENCOUNTER — Ambulatory Visit (INDEPENDENT_AMBULATORY_CARE_PROVIDER_SITE_OTHER): Payer: BC Managed Care – PPO | Admitting: Physician Assistant

## 2022-12-30 NOTE — Progress Notes (Addendum)
TeleHealth Visit:  This visit was completed with telemedicine (audio/video) technology. Nicole Mendoza has verbally consented to this TeleHealth visit. The patient is located at home, the provider is located at home. The participants in this visit include the listed provider and patient. The visit was conducted today via MyChart video.  OBESITY Nicole Mendoza is here to discuss her progress with her obesity treatment plan along with follow-up of her obesity related diagnoses.   Today's visit was # 9 Starting weight: 301 lbs Starting date: 12/26/21 Weight at last in office visit: 298 lbs on 11/25/22 Total weight loss: 3 lbs at last in office visit on 11/25/22. Today's reported weight (12/31/22): none reported  Nutrition Plan: the Category 3 plan  Current exercise:  none  Interim History:  Currently has 4 kids at home- a 52 month old, 43 yo boy (she and her husband are adopting both )and the 6 yo's 2 brothers (aged 21 and 26).  Has significant fatigue and lack of stamina. Has immunodeficiencies.  Receiving IVIG.   Started cardio/pulmonary rehab yesterday.  Referred by her allergist.  It will include exercise twice weekly and a class once weekly.  May possibly have POTs.- saw Dr. Lanna Mendoza in January 2024.  She is post to follow-up in 1 year with Dr. Graciela Mendoza.  Struggling with focus to adhere to plan. She can't focus to make a list, shop, or plan meals. She has been working on [employer] fast streamer.  She requests her next visit be virtual due to multiple medical appointments for her and her children.  Assessment/Plan:  We discussed recent lab results in depth.  1. Attention deficit Notes significant lack of focus/attention.  Finds it hard to stay on task to do most things. Denies significant depression or anxiety. She has significant stressors-4 young children, chronic fatigue, works full-time. Discussed bupropion briefly but she is not a good candidate due to chronically elevated pulse  rate/possible POTS.  Plan: Discussed possibility that lack of focus and fatigue could be related to depression.   Consider starting SSRI.  She will contact me should she decide to start something and I will send in Rx for sertraline. She will discuss possible need for further testing for ADD with Dr. Dewaine Mendoza at upcoming appointment.   2.  Elevated TSH Most recent TSH slightly elevated at 4.6. Reports significant fatigue. Lab Results  Component Value Date   TSH 4.600 (H) 11/25/2022   Plan: Check thyroid panel in the next 2 to 4 weeks. She will go to [facility] or into the clinic to have the labs drawn.  3. Prediabetes Last A1c was 5.7 on 11/25/2022 up from 5.5 in July 2023. Medication(s): None  Lab Results  Component Value Date   HGBA1C 5.7 (H) 11/25/2022   HGBA1C 5.5 12/26/2021   HGBA1C 5.6 06/22/2021   Lab Results  Component Value Date   INSULIN 21.7 11/25/2022   INSULIN 23.1 12/26/2021    Plan: Continue to working on eating a healthy diet. Consider adding metformin in the future.   4. Morbid Obesity: Current BMI 45  Nicole Mendoza is currently in the action stage of change. As such, her goal is to continue with weight loss efforts.  She has agreed to the Category 3 plan and practicing portion control and making smarter food choices, such as increasing vegetables and decreasing simple carbohydrates.  Exercise goals: No exercise has been prescribed at this time.  Behavioral modification strategies: increasing lean protein intake, decreasing simple carbohydrates , and planning for success.  Nicole Mendoza has agreed  to follow-up with our clinic in 4 weeks.  Orders Placed This Encounter  Procedures   T3   T4, free   TSH    Medications Discontinued During This Encounter  Medication Reason   buPROPion (WELLBUTRIN SR) 150 MG 12 hr tablet Side effect (s)     Meds ordered this encounter  Medications   DISCONTD: buPROPion (WELLBUTRIN SR) 150 MG 12 hr tablet    Sig: Take 1 tablet  (150 mg total) by mouth daily with breakfast.    Dispense:  30 tablet    Refill:  0    Order Specific Question:   Supervising Provider    Answer:   Nicole Mendoza [[id number]]      Objective:   VITALS: Per patient if applicable, see vitals. GENERAL: Alert and in no acute distress. CARDIOPULMONARY: No increased WOB. Speaking in clear sentences.  PSYCH: Pleasant and cooperative. Speech normal rate and rhythm. Affect is appropriate. Insight and judgement are appropriate. Attention is focused, linear, and appropriate.  NEURO: Oriented as arrived to appointment on time with no prompting.   Attestation Statements:   Reviewed by clinician on day of visit: allergies, medications, problem list, medical history, surgical history, family history, social history, and previous encounter notes.   This was prepared with the assistance of Engineer, civil (consulting).  Occasional wrong-word or sound-a-like substitutions may have occurred due to the inherent limitations of voice recognition software.

## 2022-12-31 ENCOUNTER — Telehealth (INDEPENDENT_AMBULATORY_CARE_PROVIDER_SITE_OTHER): Payer: BC Managed Care – PPO | Admitting: Family Medicine

## 2022-12-31 ENCOUNTER — Encounter (INDEPENDENT_AMBULATORY_CARE_PROVIDER_SITE_OTHER): Payer: Self-pay | Admitting: Family Medicine

## 2022-12-31 DIAGNOSIS — R7989 Other specified abnormal findings of blood chemistry: Secondary | ICD-10-CM | POA: Diagnosis not present

## 2022-12-31 DIAGNOSIS — R41840 Attention and concentration deficit: Secondary | ICD-10-CM

## 2022-12-31 DIAGNOSIS — Z6841 Body Mass Index (BMI) 40.0 and over, adult: Secondary | ICD-10-CM

## 2022-12-31 DIAGNOSIS — R7303 Prediabetes: Secondary | ICD-10-CM | POA: Diagnosis not present

## 2022-12-31 MED ORDER — BUPROPION HCL ER (SR) 150 MG PO TB12: 150.0000 mg | ORAL_TABLET | Freq: Every day | ORAL | 0 refills | Status: DC

## 2023-01-01 DIAGNOSIS — F419 Anxiety disorder, unspecified: Secondary | ICD-10-CM | POA: Diagnosis not present

## 2023-01-01 DIAGNOSIS — Z630 Problems in relationship with spouse or partner: Secondary | ICD-10-CM | POA: Diagnosis not present

## 2023-01-03 ENCOUNTER — Encounter: Payer: Self-pay | Admitting: Pulmonary Disease

## 2023-01-03 ENCOUNTER — Ambulatory Visit (INDEPENDENT_AMBULATORY_CARE_PROVIDER_SITE_OTHER): Payer: BC Managed Care – PPO | Admitting: Pulmonary Disease

## 2023-01-03 VITALS — BP 140/90 | HR 91 | Ht 68.0 in | Wt 305.2 lb

## 2023-01-03 DIAGNOSIS — Z6841 Body Mass Index (BMI) 40.0 and over, adult: Secondary | ICD-10-CM | POA: Diagnosis not present

## 2023-01-03 DIAGNOSIS — J4540 Moderate persistent asthma, uncomplicated: Secondary | ICD-10-CM

## 2023-01-03 DIAGNOSIS — J383 Other diseases of vocal cords: Secondary | ICD-10-CM | POA: Diagnosis not present

## 2023-01-03 NOTE — Progress Notes (Signed)
Synopsis: Referred in Dec 2023 for wh by Loyola Mast, MD  Subjective:   PATIENT ID: Nicole Mendoza GENDER: female DOB: 1990-08-04, MRN: [id number]  Chief Complaint  Patient presents with   Follow-up    6 months f/up    This is a 32 year old female, past medical history of gastroesophageal reflux, obesity, BMI 44, sleep apnea, hypertension, longstanding history of asthma as well as VCD.  She recently saw her primary care provider had ongoing symptoms chest tightness shortness of breath and diffuse wheezing was increased on her steroids.  However seen today in the office she is clear no symptoms no wheezing.  I suspect she was having a VCD flare at the time.  I did speak directly with Dr. Dellis Anes her asthma/allergist today via phone.  OV 01/03/2023: here today for follow up. Her vcd is better she thinks. Recent exacerbations on abx and steroids. Had igg levels checked. Seeing dr. Dellis Anes and getting replacement IVIG. She thinks this is helping and preventing her from getting sick. We talked today about the importance of weightloss.     Past Medical History:  Diagnosis Date   Allergic rhinitis    Allergy    Angio-edema    Asthma    Dysmenorrhea    Fatty liver    GERD (gastroesophageal reflux disease)    Hypertension    Recurrent upper respiratory infection (URI)    Sleep apnea    Tachycardia      Family History  Adopted: Yes  Problem Relation Age of Onset   Asthma Mother    Diabetes Mother    High blood pressure Mother    Heart disease Mother    Bipolar disorder Mother    Sleep apnea Mother    Obesity Mother    Allergic rhinitis Father    Asthma Father    Diabetes Father    High blood pressure Father    Hyperlipidemia Father    Obesity Father    Allergic rhinitis Sister    Asthma Sister    Angioedema Neg Hx    Atopy Neg Hx    Eczema Neg Hx    Immunodeficiency Neg Hx    Urticaria Neg Hx      Past Surgical History:  Procedure Laterality Date   NO PAST  SURGERIES      Social History   Socioeconomic History   Marital status: Married    Spouse name: Greig Castilla   Number of children: 0   Years of education: Not on file   Highest education level: Not on file  Occupational History   Occupation: Hospice RN  Tobacco Use   Smoking status: Never   Smokeless tobacco: Never  Vaping Use   Vaping status: Never Used  Substance and Sexual Activity   Alcohol use: No    Alcohol/week: 0.0 standard drinks of alcohol   Drug use: No   Sexual activity: Yes    Birth control/protection: Pill  Other Topics Concern   Not on file  Social History Narrative   Not on file   Social Determinants of Health   Financial Resource Strain: Not on file  Food Insecurity: Not on file  Transportation Needs: Not on file  Physical Activity: Not on file  Stress: Not on file  Social Connections: Unknown (10/16/2022)   Received from [facility]   Social Network    Social Network: Not on file  Intimate Partner Violence: Unknown (10/16/2022)   Received from [facility], [facility]   HITS  Physically Hurt: Not on file    Insult or Talk Down To: Not on file    Threaten Physical Harm: Not on file    Scream or Curse: Not on file     Allergies  Allergen Reactions   Budesonide-Formoterol Fumarate Other (See Comments)    Causes Hypertension   Other     Tree nuts   Penicillins Hives   Raspberry Hives   Spiriva Respimat [Tiotropium Bromide Monohydrate] Other (See Comments)    Pt states medication causes dizziness   Albuterol Palpitations   Losartan Rash     Outpatient Medications Prior to Visit  Medication Sig Dispense Refill   Ascorbic Acid (SUPER C COMPLEX PO)      budesonide (PULMICORT) 0.5 MG/2ML nebulizer solution Take 2 mLs (0.5 mg total) by nebulization as needed. 60 mL 2   cetirizine (ZYRTEC) 10 MG tablet Take 10 mg by mouth daily.     Cholecalciferol 100 MCG (4000 UT) CAPS Take 1 capsule (4,000 Units total) by mouth daily. 30  capsule 0   dexlansoprazole (DEXILANT) 60 MG capsule TAKE 1 CAPSULE BY MOUTH EVERY DAY **PA** 90 capsule 1   EPINEPHrine 0.3 mg/0.3 mL IJ SOAJ injection      ferrous sulfate 325 (65 FE) MG tablet 3 (three) times a week.      fluticasone (FLONASE) 50 MCG/ACT nasal spray Place 2 sprays into both nostrils daily. 16 g 5   hydrOXYzine (ATARAX) 25 MG tablet Take 1 tablet (25 mg total) by mouth every 6 (six) hours as needed for anxiety (allergies). 30 tablet 1   ipratropium (ATROVENT) 0.02 % nebulizer solution Take 1.25 mLs (0.25 mg total) by nebulization 4 (four) times daily. Mix 0.5 vial in with levalbuterol nebulizer and can use every 6 hours as needed for shortness of breath and wheezing. 75 mL 3   levalbuterol (XOPENEX HFA) 45 MCG/ACT inhaler INHALE 2 PUFFS BY MOUTH EVERY 6 HOURS AS NEEDED 15 g 0   levalbuterol (XOPENEX) 1.25 MG/3ML nebulizer solution USE 1 VIAL BY NEBULIZATION EVERY 6 (SIX) HOURS AS NEEDED FOR WHEEZING. 540 mL 1   Levonorgestrel-Ethinyl Estradiol (AMETHIA) 0.1-0.02 & 0.01 MG tablet Take 1 tablet by mouth daily.     metoprolol succinate (TOPROL-XL) 100 MG 24 hr tablet TAKE 1 TABLET BY MOUTH EVERY DAY WITH OR IMMEDIATELY FOLLOWING A MEAL 90 tablet 3   metoprolol tartrate (LOPRESSOR) 50 MG tablet TAKE 1 TABLET (50 MG TOTAL) BY MOUTH AS NEEDED (FOR PALPITATIONS). 90 tablet 1   mometasone (ASMANEX, 120 METERED DOSES,) 220 MCG/ACT inhaler Inhale 2 puffs into the lungs 2 (two) times daily. 1 each 5   montelukast (SINGULAIR) 10 MG tablet TAKE 1 TABLET BY MOUTH EVERY DAY 90 tablet 1   ondansetron (ZOFRAN) 4 MG tablet Take 1 tablet (4 mg total) by mouth every 8 (eight) hours as needed for nausea or vomiting. 20 tablet 0   Prenatal Vit-Fe Fumarate-FA (MULTIVITAMIN-PRENATAL) 27-0.8 MG TABS tablet Take 1 tablet by mouth daily at 12 noon.     Probiotic Product (PHILLIPS COLON HEALTH) CAPS      famotidine (PEPCID) 40 MG tablet Take 1 tablet (40 mg total) by mouth daily. 90 tablet 2   predniSONE  (DELTASONE) 10 MG tablet Take two tablets (20mg ) twice daily for three days, then one tablet (10mg ) twice daily for three days, then STOP. (Patient not taking: Reported on 01/03/2023) 18 tablet 0   sucralfate (CARAFATE) 1 g tablet Take 1 tablet (1 g total) by mouth 4 (four)  times daily -  with meals and at bedtime. 120 tablet 2   ASMANEX, 30 METERED DOSES, 220 MCG/ACT inhaler INHALE 2 PUFFS INTO THE LUNGS TWICE A DAY 5 each 0   No facility-administered medications prior to visit.    Review of Systems  Constitutional:  Negative for chills, fever, malaise/fatigue and weight loss.  HENT:  Negative for hearing loss, sore throat and tinnitus.   Eyes:  Negative for blurred vision and double vision.  Respiratory:  Positive for shortness of breath. Negative for cough, hemoptysis, sputum production, wheezing and stridor.   Cardiovascular:  Negative for chest pain, palpitations, orthopnea, leg swelling and PND.  Gastrointestinal:  Negative for abdominal pain, constipation, diarrhea, heartburn, nausea and vomiting.  Genitourinary:  Negative for dysuria, hematuria and urgency.  Musculoskeletal:  Negative for joint pain and myalgias.  Skin:  Negative for itching and rash.  Neurological:  Negative for dizziness, tingling, weakness and headaches.  Endo/Heme/Allergies:  Negative for environmental allergies. Does not bruise/bleed easily.  Psychiatric/Behavioral:  Negative for depression. The patient is not nervous/anxious and does not have insomnia.   All other systems reviewed and are negative.    Objective:  Physical Exam Vitals reviewed.  Constitutional:      General: She is not in acute distress.    Appearance: She is well-developed. She is obese.  HENT:     Head: Normocephalic and atraumatic.  Eyes:     General: No scleral icterus.    Conjunctiva/sclera: Conjunctivae normal.     Pupils: Pupils are equal, round, and reactive to light.  Neck:     Vascular: No JVD.     Trachea: No tracheal  deviation.  Cardiovascular:     Rate and Rhythm: Normal rate and regular rhythm.     Heart sounds: Normal heart sounds. No murmur heard. Pulmonary:     Effort: Pulmonary effort is normal. No tachypnea, accessory muscle usage or respiratory distress.     Breath sounds: No stridor. No wheezing, rhonchi or rales.  Abdominal:     General: There is no distension.     Palpations: Abdomen is soft.     Tenderness: There is no abdominal tenderness.  Musculoskeletal:        General: No tenderness.     Cervical back: Neck supple.  Lymphadenopathy:     Cervical: No cervical adenopathy.  Skin:    General: Skin is warm and dry.     Capillary Refill: Capillary refill takes less than 2 seconds.     Findings: No rash.  Neurological:     Mental Status: She is alert and oriented to person, place, and time.  Psychiatric:        Behavior: Behavior normal.      Vitals:   01/03/23 1356  BP: (!) 140/90  Pulse: 91  SpO2: 97%  Weight: (!) 305 lb 3.2 oz (138.4 kg)  Height: 5\' 8"  (1.727 m)    97% on RA BMI Readings from Last 3 Encounters:  01/03/23 46.41 kg/m  12/10/22 47.59 kg/m  11/25/22 45.31 kg/m   Wt Readings from Last 3 Encounters:  01/03/23 (!) 305 lb 3.2 oz (138.4 kg)  12/10/22 (!) 313 lb (142 kg)  11/25/22 298 lb (135.2 kg)     CBC    Component Value Date/Time   WBC 10.7 11/25/2022 0855   WBC 7.9 07/09/2021 1132   RBC 4.50 11/25/2022 0855   RBC 4.70 07/09/2021 1132   HGB 12.5 11/25/2022 0855   HCT 38.7 11/25/2022 0855  PLT 205 11/25/2022 0855   MCV 86 11/25/2022 0855   MCH 27.8 11/25/2022 0855   MCH 28.6 05/09/2021 1415   MCHC 32.3 11/25/2022 0855   MCHC 32.9 07/09/2021 1132   RDW 13.2 11/25/2022 0855   LYMPHSABS 3.3 (H) 11/25/2022 0855   MONOABS 0.7 07/09/2021 1132   EOSABS 0.2 11/25/2022 0855   BASOSABS 0.1 11/25/2022 0855     Chest Imaging: CT chest: Few small peripheral less than 4mm nodules  The patient's images have been independently reviewed by me.     Pulmonary Functions Testing Results:     No data to display          FeNO:   Pathology:   Echocardiogram:   Heart Catheterization:     Assessment & Plan:     ICD-10-CM   1. Vocal cord dysfunction  J38.3     2. Morbid obesity with BMI of 45.0-49.9, adult (HCC)  E66.01    Z68.42     3. Moderate persistent asthma without complication  J45.40        Discussion:  32 yo FM, mild persistent asthma, vcd, obese bmi 46  P: Continue follow up with allergy and asthma Continue current inhaler regimen  I agree with health weightloss referral, she just started seeing them  At somepoint to think she needs to see bariatric surgery to consider sustained weight loss.  RTC 1 year or as needed    Current Outpatient Medications:    Ascorbic Acid (SUPER C COMPLEX PO), , Disp: , Rfl:    budesonide (PULMICORT) 0.5 MG/2ML nebulizer solution, Take 2 mLs (0.5 mg total) by nebulization as needed., Disp: 60 mL, Rfl: 2   cetirizine (ZYRTEC) 10 MG tablet, Take 10 mg by mouth daily., Disp: , Rfl:    Cholecalciferol 100 MCG (4000 UT) CAPS, Take 1 capsule (4,000 Units total) by mouth daily., Disp: 30 capsule, Rfl: 0   dexlansoprazole (DEXILANT) 60 MG capsule, TAKE 1 CAPSULE BY MOUTH EVERY DAY **PA**, Disp: 90 capsule, Rfl: 1   EPINEPHrine 0.3 mg/0.3 mL IJ SOAJ injection, , Disp: , Rfl:    ferrous sulfate 325 (65 FE) MG tablet, 3 (three) times a week. , Disp: , Rfl:    fluticasone (FLONASE) 50 MCG/ACT nasal spray, Place 2 sprays into both nostrils daily., Disp: 16 g, Rfl: 5   hydrOXYzine (ATARAX) 25 MG tablet, Take 1 tablet (25 mg total) by mouth every 6 (six) hours as needed for anxiety (allergies)., Disp: 30 tablet, Rfl: 1   ipratropium (ATROVENT) 0.02 % nebulizer solution, Take 1.25 mLs (0.25 mg total) by nebulization 4 (four) times daily. Mix 0.5 vial in with levalbuterol nebulizer and can use every 6 hours as needed for shortness of breath and wheezing., Disp: 75 mL, Rfl: 3   levalbuterol  (XOPENEX HFA) 45 MCG/ACT inhaler, INHALE 2 PUFFS BY MOUTH EVERY 6 HOURS AS NEEDED, Disp: 15 g, Rfl: 0   levalbuterol (XOPENEX) 1.25 MG/3ML nebulizer solution, USE 1 VIAL BY NEBULIZATION EVERY 6 (SIX) HOURS AS NEEDED FOR WHEEZING., Disp: 540 mL, Rfl: 1   Levonorgestrel-Ethinyl Estradiol (AMETHIA) 0.1-0.02 & 0.01 MG tablet, Take 1 tablet by mouth daily., Disp: , Rfl:    metoprolol succinate (TOPROL-XL) 100 MG 24 hr tablet, TAKE 1 TABLET BY MOUTH EVERY DAY WITH OR IMMEDIATELY FOLLOWING A MEAL, Disp: 90 tablet, Rfl: 3   metoprolol tartrate (LOPRESSOR) 50 MG tablet, TAKE 1 TABLET (50 MG TOTAL) BY MOUTH AS NEEDED (FOR PALPITATIONS)., Disp: 90 tablet, Rfl: 1   mometasone (  ASMANEX, 120 METERED DOSES,) 220 MCG/ACT inhaler, Inhale 2 puffs into the lungs 2 (two) times daily., Disp: 1 each, Rfl: 5   montelukast (SINGULAIR) 10 MG tablet, TAKE 1 TABLET BY MOUTH EVERY DAY, Disp: 90 tablet, Rfl: 1   ondansetron (ZOFRAN) 4 MG tablet, Take 1 tablet (4 mg total) by mouth every 8 (eight) hours as needed for nausea or vomiting., Disp: 20 tablet, Rfl: 0   Prenatal Vit-Fe Fumarate-FA (MULTIVITAMIN-PRENATAL) 27-0.8 MG TABS tablet, Take 1 tablet by mouth daily at 12 noon., Disp: , Rfl:    Probiotic Product (PHILLIPS COLON HEALTH) CAPS, , Disp: , Rfl:    famotidine (PEPCID) 40 MG tablet, Take 1 tablet (40 mg total) by mouth daily., Disp: 90 tablet, Rfl: 2   predniSONE (DELTASONE) 10 MG tablet, Take two tablets (20mg ) twice daily for three days, then one tablet (10mg ) twice daily for three days, then STOP. (Patient not taking: Reported on 01/03/2023), Disp: 18 tablet, Rfl: 0   sucralfate (CARAFATE) 1 g tablet, Take 1 tablet (1 g total) by mouth 4 (four) times daily -  with meals and at bedtime., Disp: 120 tablet, Rfl: 2   Josephine Igo, DO [facility] 01/03/2023 2:08 PM

## 2023-01-03 NOTE — Patient Instructions (Addendum)
Thank you for visiting Dr. Tonia Brooms at [facility]. Today we recommend the following:  Continue current inhaler regimen No ct follow up needed   Return in about 1 year (around 01/03/2024), or if symptoms worsen or fail to improve.    Please do your part to reduce the spread of COVID-19.

## 2023-01-07 ENCOUNTER — Telehealth (INDEPENDENT_AMBULATORY_CARE_PROVIDER_SITE_OTHER): Payer: BC Managed Care – PPO | Admitting: Psychology

## 2023-01-07 ENCOUNTER — Encounter (INDEPENDENT_AMBULATORY_CARE_PROVIDER_SITE_OTHER): Payer: Self-pay | Admitting: Family Medicine

## 2023-01-07 DIAGNOSIS — G4733 Obstructive sleep apnea (adult) (pediatric): Secondary | ICD-10-CM | POA: Diagnosis not present

## 2023-01-27 NOTE — Progress Notes (Signed)
TeleHealth Visit:  This visit was completed with telemedicine (audio/video) technology. Nicole Mendoza has verbally consented to this TeleHealth visit. The patient is located at home, the provider is located at home. The participants in this visit include the listed provider and patient. The visit was conducted today via MyChart video.  OBESITY Nicole Mendoza is here to discuss her progress with her obesity treatment plan along with follow-up of her obesity related diagnoses.   Today's visit was # 10 Starting weight: 301 lbs Starting date: 12/26/21 Weight at last in office visit: 298 lbs on 11/25/22 Total weight loss: 3 lbs at last in office visit on 11/25/22. No weight reported last virtual visit on 12/31/2022. Today's reported weight (01/28/23): none reported  Nutrition Plan: the Category 3 plan and journaling dinner with goal of 450-600 calories and 40 grams of protein   Current exercise: none  Interim History:  Her husband lost his job but has started another job. She will have new insurance starting in a few months. She will start pulmonary rehab next week. She is following the plan sporadically.  She does well at breakfast and lunch and she says that things "fall apart" after that. Cravings kick in. She craves salt. She feels portions are normal but snacking is the issue. She has lost weight and regained several times in her life.  Her pulmonologist suggested she look into bariatric surgery and she is considering it.  Assessment/Plan:  1. Tachycardia Sees cardiology.  Most likely has POTS. Cardiologist says she definitely has orthostatic intolerance. Taking metoprolol.  Plan: Follow up with cardiology as directed.  Continue metoprolol.  2. Prediabetes Last A1c was 5.7. Father and several other relatives have DM.   Medication(s): None Polyphagia:Yes She is concerned about GI SE and kidney effects with metformin. Lab Results  Component Value Date   HGBA1C 5.7 (H) 11/25/2022    HGBA1C 5.5 12/26/2021   HGBA1C 5.6 06/22/2021   Lab Results  Component Value Date   INSULIN 21.7 11/25/2022   INSULIN 23.1 12/26/2021    Plan: Educated about metformin. She agrees to start 500 mg ER. Start Metformin ER 500 mg once daily breakfast  3. Morbid Obesity: Current BMI 45  Nicole Mendoza is currently in the action stage of change. As such, her goal is to continue with weight loss efforts.  She has agreed to the Category 3 plan and journaling dinner with goal of 450-600 calories and 40 grams of protein.  Information was provided to sign up for the bariatric surgery seminar [facility] seminar). Patient was advised that bariatric surgery is a tool for weight loss and will not be successful long-term without significant dietary changes and exercise.  Exercise goals: begin pulmonary rehab next week.  Behavioral modification strategies: increasing lean protein intake, decreasing simple carbohydrates , and planning for success.  Nicole Mendoza has agreed to follow-up with our clinic in 4 weeks.  No orders of the defined types were placed in this encounter.   There are no discontinued medications.   Meds ordered this encounter  Medications   metFORMIN (GLUCOPHAGE-XR) 500 MG 24 hr tablet    Sig: Take 1 tablet (500 mg total) by mouth daily with breakfast.    Dispense:  30 tablet    Refill:  0    Order Specific Question:   Supervising Provider    Answer:   Glennis Brink [[id number]]      Objective:   VITALS: Per patient if applicable, see vitals. GENERAL: Alert and in no acute distress.  CARDIOPULMONARY: No increased WOB. Speaking in clear sentences.  PSYCH: Pleasant and cooperative. Speech normal rate and rhythm. Affect is appropriate. Insight and judgement are appropriate. Attention is focused, linear, and appropriate.  NEURO: Oriented as arrived to appointment on time with no prompting.   Attestation Statements:   Reviewed by clinician on day of visit:  allergies, medications, problem list, medical history, surgical history, family history, social history, and previous encounter notes.   This was prepared with the assistance of Engineer, civil (consulting).  Occasional wrong-word or sound-a-like substitutions may have occurred due to the inherent limitations of voice recognition software.

## 2023-01-28 ENCOUNTER — Encounter (INDEPENDENT_AMBULATORY_CARE_PROVIDER_SITE_OTHER): Payer: Self-pay | Admitting: Family Medicine

## 2023-01-28 ENCOUNTER — Telehealth (INDEPENDENT_AMBULATORY_CARE_PROVIDER_SITE_OTHER): Payer: BC Managed Care – PPO | Admitting: Family Medicine

## 2023-01-28 DIAGNOSIS — R000 Tachycardia, unspecified: Secondary | ICD-10-CM

## 2023-01-28 DIAGNOSIS — R7303 Prediabetes: Secondary | ICD-10-CM

## 2023-01-28 DIAGNOSIS — Z6841 Body Mass Index (BMI) 40.0 and over, adult: Secondary | ICD-10-CM | POA: Diagnosis not present

## 2023-01-28 MED ORDER — METFORMIN HCL ER 500 MG PO TB24
500.0000 mg | ORAL_TABLET | Freq: Every day | ORAL | 0 refills | Status: DC
Start: 2023-01-28 — End: 2023-06-17

## 2023-01-30 ENCOUNTER — Encounter (INDEPENDENT_AMBULATORY_CARE_PROVIDER_SITE_OTHER): Payer: Self-pay

## 2023-02-03 ENCOUNTER — Ambulatory Visit: Payer: BC Managed Care – PPO | Admitting: Pulmonary Disease

## 2023-02-06 ENCOUNTER — Telehealth: Payer: Self-pay | Admitting: Allergy & Immunology

## 2023-02-06 MED ORDER — CEFDINIR 300 MG PO CAPS: 300.0000 mg | ORAL_CAPSULE | Freq: Two times a day (BID) | ORAL | 0 refills | Status: AC

## 2023-02-06 NOTE — Patient Instructions (Signed)

## 2023-02-06 NOTE — Telephone Encounter (Signed)
Patient called reporting sinus pain and pressure. She is late for her Hizentra due to insurance coverage. She is going to get the infusion tomorrow, but she is miserable. So I sent in a course of cefdinir.

## 2023-02-06 NOTE — Progress Notes (Signed)
PATIENT: Nicole Mendoza DOB: 11-16-1990  REASON FOR VISIT: follow up HISTORY FROM: patient  Virtual Visit via Telephone Note  I connected with Nicole Mendoza on 02/11/23 at  8:30 AM EDT by telephone and verified that I am speaking with the correct person using two identifiers.   I discussed the limitations, risks, security and privacy concerns of performing an evaluation and management service by telephone and the availability of in person appointments. I also discussed with the patient that there may be a patient responsible charge related to this service. The patient expressed understanding and agreed to proceed.   History of Present Illness:  02/11/2023 ALL (Mychart): Nicole Mendoza returns for follow up for OSA on CPAP. She reports doing well on CPAP. She is using therapy every night for about 6-7 hours, on average. She notes feeling terribly if not using therapy. She continues to have chronic fatigue and daytime sleepiness post Covid. She does not wake feeling refreshed. She feels that she could fall asleep almost anytime during the day. She was diagnosed with fibromyalgia by rheumatology. She was started on Privigen infusions (antibodies) for management of chronic fatigue syndrome by immunology. She continues to see healthy weight and wellness. She reported trouble with focusing. Stimulants avoided due to history of autonomic dysfunction and tachycardia. Considering Wellbutrin. She is focusing on healthy lifestyle habits. She recently resigned from her second job to allow more time for rest.     02/05/2022 ALL (Mychart): Nicole Mendoza returns for follow up for OSA on CPAP. She continues to do very well. She is tolerating pressure settings. She is using CPAP nightly for about 7 hours. She denies concerns, today.     01/30/21 ALL (Mychart): Nicole Mendoza returns for follow up for OSA on CPAP and post Covid anosmia and ageusia. She continues CPAP nightly and feels she is doing well. She is followed by Rehab for  Covid long hauler with deconditioning. She reports doing well on CPAP. She is using her machine every night. She feels that she is doing better post Covid in 2020 with recurrent viral symptoms in 09/2020. She did not start carbamazepine. She does have difficulty waking in the mornings. Her husband reports trying to wake her up for over an hour, recently. She is able to maintain responsibilities and lifestyle.     01/12/2020 CD:  Nicole Mendoza is a 32 y.o. year old White or Caucasian female patient seen here upon PCP  referral on 01/12/2020 from Angus Seller, NP .   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 Frances Furbish the same month she contracted Covid.  She became very short of breath and since she had an underlying condition of asthma struggled for over 10 weeks with significant respiratory difficulties, she remains extremely fatigued with myalgia, limited exercise tolerance and she was also evaluated for POTS.  Autoimmune disorders work-up was negative by rheumatology cardiology.  She reports ongoing smelling smoke and the character of her olfactory triggers a set of foot burning food or meat.  She also becomes very nauseated when she smells ciggarette smoke , also there is no source of the "burning" smells.    I have the pleasure of seeing Nicole Coco, Nicole Mendoza  today, a right-handed White or Caucasian female with a loss of taste and smell after COVID 19, she has a  has a past medical history of Allergic rhinitis, Angio-edema, Asthma, Fatty liver, GERD (gastroesophageal  reflux disease), Hypertension, Recurrent upper respiratory infection (URI), and Tachycardia.    Dr. Gilmore Laroche tested the patient for sleep apnea the results were positive and she has been using CPAP over 11 months now.  She is compliant.  10/07/2019 ALL: Nicole Mendoza is a 32 y.o. female here today for follow up of OSA on CPAP.  She reports  that she is doing very well with CPAP therapy.  She is using her machine every night.  She does continue close follow-up with primary care post Covid.  She continues to struggle with intermittent fatigue and pain.  She is now working with physical therapy to help with strengthening and endurance.   Compliance report dated 09/06/2019 through 10/05/2019 reveals that she is used CPAP every night for compliance of 100%.  She has used CPAP greater than 4 hours every night.  Average usage is 7 hours and 30 minutes.  Residual AHI 0.4 on 5 to 10 cm of water and an EPR of 1.  There was no significant leak noted.  Observations/Objective:  Generalized: Well developed, in no acute distress  Mentation: Alert oriented to time, place, history taking. Follows all commands speech and language fluent   Assessment and Plan:  32 y.o. year old female  has a past medical history of Allergic rhinitis, Allergy, Angio-edema, Asthma, Dysmenorrhea, Fatty liver, GERD (gastroesophageal reflux disease), Hypertension, Recurrent upper respiratory infection (URI), Sleep apnea, and Tachycardia. here with    ICD-10-CM   1. OSA on CPAP  G47.33 For home use only DME continuous positive airway pressure (CPAP)    2. Excessive daytime sleepiness  G47.19     3. Morbid obesity (HCC)  E66.01     4. 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 Tachycardia  R00.0     7. Fibromyalgia  M79.7       Nicole Mendoza is doing well on CPAP therapy. Compliance report shows excellent compliance. She was encouraged to continue therapy nightly for at least 4 hours. Unfortunately, she continues to have significant fatigue and daytime sleepiness post Covid infection in 2020. ESS 14/24. FSS not completed due to nature of video visit. She is followed by multiple specialists and participating in treatment plans as directed. She is concerned about possible underlying narcolepsy. Due to autonomic dysfunction, she is concerned about taking  stimulant medications. She requests to see Dr Vickey Huger in follow up to discuss need for further workup and testing as appropriate. I have encouraged her to resume immunotherapy as planned. I will have her see Dr Vickey Huger about 4 months after she resumes immunotherapy infusions. She will focus on healthy lifestyle habits. She will follow up with pending visit with Dr Vickey Huger 07/2023.    Orders Placed This Encounter  Procedures   For home use only DME continuous positive airway pressure (CPAP)    Supplies    Order Specific Question:   Length of Need    Answer:   Lifetime    Order Specific Question:   Patient has OSA or probable OSA    Answer:   Yes    Order Specific Question:   Is the patient currently using CPAP in the home    Answer:   Yes    Order Specific Question:   Settings    Answer:   Other see comments    Order Specific Question:   CPAP supplies needed    Answer:   Mask, headgear, cushions, filters, heated tubing and  water chamber    No orders of the defined types were placed in this encounter.    Follow Up Instructions:  I discussed the assessment and treatment plan with the patient. The patient was provided an opportunity to ask questions and all were answered. The patient agreed with the plan and demonstrated an understanding of the instructions.   The patient was advised to call back or seek an in-person evaluation if the symptoms worsen or if the condition fails to improve as anticipated.  I provided 30 minutes of non-face-to-face time during this encounter. Patient located at their place of residence during Mychart visit. Provider is in the office.    Shawnie Dapper, NP

## 2023-02-10 ENCOUNTER — Encounter: Payer: Self-pay | Admitting: *Deleted

## 2023-02-11 ENCOUNTER — Telehealth (INDEPENDENT_AMBULATORY_CARE_PROVIDER_SITE_OTHER): Payer: BC Managed Care – PPO | Admitting: Family Medicine

## 2023-02-11 ENCOUNTER — Encounter: Payer: Self-pay | Admitting: Family Medicine

## 2023-02-11 DIAGNOSIS — G4719 Other hypersomnia: Secondary | ICD-10-CM

## 2023-02-11 DIAGNOSIS — M797 Fibromyalgia: Secondary | ICD-10-CM

## 2023-02-11 DIAGNOSIS — G4733 Obstructive sleep apnea (adult) (pediatric): Secondary | ICD-10-CM | POA: Diagnosis not present

## 2023-02-11 DIAGNOSIS — R5382 Chronic fatigue, unspecified: Secondary | ICD-10-CM | POA: Diagnosis not present

## 2023-02-11 DIAGNOSIS — U099 Post covid-19 condition, unspecified: Secondary | ICD-10-CM

## 2023-02-11 DIAGNOSIS — R000 Tachycardia, unspecified: Secondary | ICD-10-CM

## 2023-02-14 ENCOUNTER — Telehealth: Payer: Self-pay | Admitting: Family Medicine

## 2023-02-14 ENCOUNTER — Telehealth: Payer: Self-pay

## 2023-02-14 DIAGNOSIS — Z0279 Encounter for issue of other medical certificate: Secondary | ICD-10-CM

## 2023-02-14 NOTE — Telephone Encounter (Signed)
Patient dropped off document  medical eval form , to be filled out by provider. Patient requested to send it back via Call Patient to pick up within 5-days. Document is located in providers tray at front office.Please advise at Mobile [phone] (mobile)   Pt dropped off. I put in the dr box

## 2023-02-14 NOTE — Telephone Encounter (Signed)
Can we please submit a pa for dexilant pt has failed all other ppi's  Thank you

## 2023-02-14 NOTE — Telephone Encounter (Signed)
Form placed in providers box. Dm/cma

## 2023-02-18 ENCOUNTER — Other Ambulatory Visit (HOSPITAL_COMMUNITY): Payer: Self-pay

## 2023-02-18 NOTE — Telephone Encounter (Signed)
Patient filled and picked up a 90 day supply of generic dexilant (dexlansoprazole) 02-08-2023

## 2023-02-18 NOTE — Telephone Encounter (Signed)
Form was placed up front for pick up. Patient notified. Dm/cma

## 2023-02-18 NOTE — Telephone Encounter (Signed)
Noted thank you

## 2023-02-18 NOTE — Telephone Encounter (Signed)
Pt did not pick up rx due to cost. Can we attempt a pa

## 2023-02-19 ENCOUNTER — Other Ambulatory Visit (HOSPITAL_COMMUNITY): Payer: Self-pay

## 2023-02-19 ENCOUNTER — Telehealth: Payer: Self-pay

## 2023-02-19 NOTE — Telephone Encounter (Signed)
Can you do a test claim for generic 30 day pricea

## 2023-02-19 NOTE — Telephone Encounter (Signed)
I'm not sure if it will go through since the generic is preferred and covered. The co-pay may also change if she fills for a 30 day supply versus a 90 day. I will attempt a pa for the brand though and it will be documented in another encounter.

## 2023-02-19 NOTE — Telephone Encounter (Signed)
Pharmacy Patient Advocate Encounter   Received notification from Pt Calls Messages that prior authorization for Dexilant 60MG  dr capsules is required/requested.   Insurance verification completed.   The patient is insured through [employer] .   Per test claim: PA required; PA started via CoverMyMeds. KEY [id number] . Waiting for clinical questions to populate.

## 2023-02-19 NOTE — Telephone Encounter (Signed)
The generic is what is currently filled at patient's pharmacy

## 2023-02-19 NOTE — Telephone Encounter (Signed)
So generic is covered?

## 2023-02-20 ENCOUNTER — Other Ambulatory Visit (HOSPITAL_COMMUNITY): Payer: Self-pay

## 2023-02-20 NOTE — Telephone Encounter (Signed)
Because it's already filled, the insurance is not letting me see a co-pay. If patient would like, she can contact her pharmacy and they can give her prices

## 2023-02-21 NOTE — Telephone Encounter (Signed)
INFORMED PT she will contact [facility]

## 2023-02-24 ENCOUNTER — Other Ambulatory Visit (HOSPITAL_COMMUNITY): Payer: Self-pay

## 2023-02-24 NOTE — Telephone Encounter (Signed)
Pharmacy Patient Advocate Encounter  PA being cancelled, generic covered and ready for patient at pharmacy

## 2023-02-26 ENCOUNTER — Ambulatory Visit (INDEPENDENT_AMBULATORY_CARE_PROVIDER_SITE_OTHER): Payer: Self-pay | Admitting: Physician Assistant

## 2023-02-28 DIAGNOSIS — J069 Acute upper respiratory infection, unspecified: Secondary | ICD-10-CM | POA: Diagnosis not present

## 2023-03-07 ENCOUNTER — Telehealth: Payer: Self-pay | Admitting: Allergy & Immunology

## 2023-03-07 DIAGNOSIS — D806 Antibody deficiency with near-normal immunoglobulins or with hyperimmunoglobulinemia: Secondary | ICD-10-CM | POA: Diagnosis not present

## 2023-03-07 NOTE — Telephone Encounter (Signed)
Patient called reporting intense pruritus around her injection sites despite premedication with Benadryl. She was not tolerating it well and ended the infusion prematurely. I did confirm that this was not expired Hizentra that had been in her fridge from years ago when we got it approved back then.  She is not interested in pursuing SCIG any longer. She prefers to go back to Privagen instead, but she has figured out with her nurse how to tweak it so that she does not have the side effects that she was experiencing previously.   Routing to [name] to keep her in the loop.  Malachi Bonds, MD [facility]

## 2023-03-10 NOTE — Telephone Encounter (Signed)
Called patient and advised that I have spoke to [employer] to reinstate her Privigen orders along with her hydration. She also advised that she may have change Ins to her husband 10/1 or she may just add his to her coverage but she doesn't know . I advised if she changes to [facility] she will have to change pharmacy and nursing so she doesn't know she wants to change that.

## 2023-03-12 ENCOUNTER — Ambulatory Visit: Payer: Self-pay | Admitting: Family Medicine

## 2023-03-12 ENCOUNTER — Telehealth: Payer: Self-pay | Admitting: Allergy & Immunology

## 2023-03-12 MED ORDER — PANTOPRAZOLE SODIUM 40 MG PO TBEC: 40.0000 mg | DELAYED_RELEASE_TABLET | Freq: Two times a day (BID) | ORAL | 3 refills | Status: DC

## 2023-03-12 NOTE — Telephone Encounter (Signed)
Patient contacted me requesting a prescription for Protonix. She has been on omeprazole, but it is not cutting it. She does report the pharmacy told her that they would cover the Protonix, therefore I sent that in.  Malachi Bonds, MD [facility]

## 2023-03-15 ENCOUNTER — Other Ambulatory Visit: Payer: Self-pay | Admitting: Allergy & Immunology

## 2023-03-18 ENCOUNTER — Other Ambulatory Visit (HOSPITAL_COMMUNITY): Payer: Self-pay

## 2023-03-21 ENCOUNTER — Encounter: Payer: Self-pay | Admitting: Family Medicine

## 2023-03-21 ENCOUNTER — Ambulatory Visit (INDEPENDENT_AMBULATORY_CARE_PROVIDER_SITE_OTHER): Payer: BC Managed Care – PPO | Admitting: Family Medicine

## 2023-03-21 VITALS — BP 130/82 | HR 94 | Temp 98.7°F | Ht 68.0 in | Wt 308.8 lb

## 2023-03-21 DIAGNOSIS — J4540 Moderate persistent asthma, uncomplicated: Secondary | ICD-10-CM

## 2023-03-21 DIAGNOSIS — R000 Tachycardia, unspecified: Secondary | ICD-10-CM

## 2023-03-21 DIAGNOSIS — D806 Antibody deficiency with near-normal immunoglobulins or with hyperimmunoglobulinemia: Secondary | ICD-10-CM

## 2023-03-21 DIAGNOSIS — I10 Essential (primary) hypertension: Secondary | ICD-10-CM | POA: Diagnosis not present

## 2023-03-21 DIAGNOSIS — Z6841 Body Mass Index (BMI) 40.0 and over, adult: Secondary | ICD-10-CM

## 2023-03-21 NOTE — Progress Notes (Signed)
[facility] [address] [place] [address] Dept: [phone] Dept Fax: [phone]  Chronic Care Office Visit  Subjective:    Patient ID: Nicole Mendoza, female    DOB: 10-11-1990, 32 y.o..   MRN: [id number]  Chief Complaint  Patient presents with   Follow-up   History of Present Illness:  Patient is in today for reassessment of chronic medical issues.  Ms. Medley has a history of hypertension. She is on metoprolol 100 mg daily, but this was primarily prescribed for dealing with tachycardia issues that she developed in her early 73s (possibly POTS disease).    Ms. Hynds has moderate persistent asthma and does see Dr. Dellis Anes (allergist) related to this. She is treated with a combination of therapies for her allergies and asthma, including Xopenox (inhaler and nebs), Asmanex, Atrovent, and Singulair. She also occasionally uses a budesonide neb. For her allergies, she also uses cetirizine, and a fluticasone nasal spray Timmothy Sours). She has PRN hydroxyzine for itching and occasioanl anxiety. She had been receiving IVIG. Due to a change in her insurance, she went through a lapse in coverage. When resumed, they attempted subcutaneous infusions, but she had a hive-like reaction. Plans are to resume the IVIG. Ms. Larusso notes that she has noted a number of symptoms now that she is off of IVIG and realizes how much this was helping her.   Ms. Lores has morbid obesity.  She is now engaged in the [facility] clinic. She has been identified as having some focus issues and binge eating that could relate to adult ADHD. There was discussion about a trial of bupropion, but she was concerned about how this might impact her tachycardia issues.  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   Recurrent infections 12/20/2020   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 11/22/2020   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   Recurrent sinusitis 11/09/2020   Pre-nodular edema of the vocal folds 11/09/2020   ASCUS of cervix with negative high risk HPV 07/10/2020   Cardiac murmur 09/02/2019   Tachycardia 09/01/2019   Salivary stone 09/01/2019   Anaphylactic shock due to adverse food reaction 01/22/2019   Seasonal and perennial allergic rhinitis 01/22/2019   Morbid obesity (HCC) 10/28/2017   Moderate persistent asthma without complication 07/29/2017   Essential hypertension 04/22/2017   Dysmenorrhea 06/26/2016   Menorrhagia with regular cycle 06/26/2016   Gastroesophageal reflux disease 12/22/2015   Vitamin D deficiency 11/02/2014   Past Surgical History:  Procedure Laterality Date   NO PAST SURGERIES     Family History  Adopted: Yes  Problem Relation Age of Onset   Asthma Mother    Diabetes Mother    High blood pressure Mother    Heart disease Mother    Bipolar disorder Mother    Sleep apnea Mother    Obesity Mother  Allergic rhinitis Father    Asthma Father    Diabetes Father    High blood pressure Father    Hyperlipidemia Father    Obesity Father    Allergic rhinitis Sister    Asthma Sister    Angioedema Neg Hx    Atopy Neg Hx    Eczema Neg Hx    Immunodeficiency Neg Hx    Urticaria Neg Hx    Outpatient Medications Prior to Visit  Medication Sig Dispense  Refill   Ascorbic Acid (SUPER C COMPLEX PO)      budesonide (PULMICORT) 0.5 MG/2ML nebulizer solution Take 2 mLs (0.5 mg total) by nebulization as needed. 60 mL 2   cetirizine (ZYRTEC) 10 MG tablet Take 10 mg by mouth daily.     Cholecalciferol 100 MCG (4000 UT) CAPS Take 1 capsule (4,000 Units total) by mouth daily. 30 capsule 0   dexlansoprazole (DEXILANT) 60 MG capsule TAKE 1 CAPSULE BY MOUTH EVERY DAY **PA** 90 capsule 1   EPINEPHrine 0.3 mg/0.3 mL IJ SOAJ injection      ferrous sulfate 325 (65 FE) MG tablet 3 (three) times a week.      fluticasone (FLONASE) 50 MCG/ACT nasal spray Place 2 sprays into both nostrils daily. 16 g 5   hydrOXYzine (ATARAX) 25 MG tablet Take 1 tablet (25 mg total) by mouth every 6 (six) hours as needed for anxiety (allergies). 30 tablet 1   ipratropium (ATROVENT) 0.02 % nebulizer solution Take 1.25 mLs (0.25 mg total) by nebulization 4 (four) times daily. Mix 0.5 vial in with levalbuterol nebulizer and can use every 6 hours as needed for shortness of breath and wheezing. (Patient taking differently: Take 0.25 mg by nebulization as needed. Mix 0.5 vial in with levalbuterol nebulizer and can use every 6 hours as needed for shortness of breath and wheezing.) 75 mL 3   levalbuterol (XOPENEX HFA) 45 MCG/ACT inhaler INHALE 2 PUFFS BY MOUTH EVERY 6 HOURS AS NEEDED 15 g 0   levalbuterol (XOPENEX) 1.25 MG/3ML nebulizer solution USE 1 VIAL BY NEBULIZATION EVERY 6 (SIX) HOURS AS NEEDED FOR WHEEZING. 540 mL 1   Levonorgestrel-Ethinyl Estradiol (AMETHIA) 0.1-0.02 & 0.01 MG tablet Take 1 tablet by mouth daily.     metFORMIN (GLUCOPHAGE-XR) 500 MG 24 hr tablet Take 1 tablet (500 mg total) by mouth daily with breakfast. 30 tablet 0   metoprolol succinate (TOPROL-XL) 100 MG 24 hr tablet TAKE 1 TABLET BY MOUTH EVERY DAY WITH OR IMMEDIATELY FOLLOWING A MEAL 90 tablet 3   metoprolol tartrate (LOPRESSOR) 50 MG tablet TAKE 1 TABLET (50 MG TOTAL) BY MOUTH AS NEEDED (FOR PALPITATIONS). 90  tablet 1   mometasone (ASMANEX, 120 METERED DOSES,) 220 MCG/ACT inhaler Inhale 2 puffs into the lungs 2 (two) times daily. 1 each 5   montelukast (SINGULAIR) 10 MG tablet TAKE 1 TABLET BY MOUTH EVERY DAY 90 tablet 1   ondansetron (ZOFRAN) 4 MG tablet Take 1 tablet (4 mg total) by mouth every 8 (eight) hours as needed for nausea or vomiting. 20 tablet 0   pantoprazole (PROTONIX) 40 MG tablet Take 1 tablet (40 mg total) by mouth 2 (two) times daily. 60 tablet 3   Prenatal Vit-Fe Fumarate-FA (MULTIVITAMIN-PRENATAL) 27-0.8 MG TABS tablet Take 1 tablet by mouth daily at 12 noon.     Probiotic Product (PHILLIPS COLON HEALTH) CAPS      famotidine (PEPCID) 40 MG tablet Take 1 tablet (40 mg total) by mouth daily. 90 tablet 2   predniSONE (DELTASONE)  10 MG tablet Take two tablets (20mg ) twice daily for three days, then one tablet (10mg ) twice daily for three days, then STOP. (Patient not taking: Reported on 01/03/2023) 18 tablet 0   sucralfate (CARAFATE) 1 g tablet Take 1 tablet (1 g total) by mouth 4 (four) times daily -  with meals and at bedtime. 120 tablet 2   No facility-administered medications prior to visit.   Allergies  Allergen Reactions   Budesonide-Formoterol Fumarate Other (See Comments)    Causes Hypertension   Other     Tree nuts   Penicillins Hives   Raspberry Hives   Spiriva Respimat [Tiotropium Bromide Monohydrate] Other (See Comments)    Pt states medication causes dizziness   Albuterol Palpitations   Losartan Rash   Objective:   Today's Vitals   03/21/23 0833  BP: 130/82  Pulse: 94  Temp: 98.7 F (37.1 C)  TempSrc: Temporal  SpO2: 97%  Weight: (!) 308 lb 12.8 oz (140.1 kg)  Height: 5\' 8"  (1.727 m)   Body mass index is 46.95 kg/m.   General: Well developed, well nourished. No acute distress. Lungs: Clear to auscultation bilaterally. No wheezing, rales, or rhonchi. Psych: Alert and oriented. Normal mood and affect.  Health Maintenance Due  Topic Date Due   HIV  Screening  Never done   Hepatitis C Screening  Never done     Assessment & Plan:   Problem List Items Addressed This Visit       Cardiovascular and Mediastinum   Essential hypertension - Primary    Blood pressure is in adequate control. Continue metoprolol 100 mg daily.        Respiratory   Moderate persistent asthma without complication    Well-controlled. Continue montelukast 10 mg daily, levalbuterol PRN, and Pulmicort daily.        Other   BMI 45.0-49.9, adult (HCC) Current BMI 45.2    Continue working with [facility] [employer]. I am okay with her doing a trial of bupropion, preferring this over Adderall or Ritalin for treatment of any potential binge eating disorder. She will discuss this with the physician at [facility].      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 Dellis Anes.      Tachycardia    Possible POTS. Continue metoprolol.       Return in about 3 months (around 06/21/2023) for Reassessment.   Loyola Mast, MD

## 2023-03-21 NOTE — Assessment & Plan Note (Signed)
Blood pressure is in adequate control. Continue metoprolol 100 mg daily.

## 2023-03-21 NOTE — Assessment & Plan Note (Signed)
Possible POTS. Continue metoprolol.

## 2023-03-21 NOTE — Assessment & Plan Note (Signed)
Continue working with Dr. Dellis Anes.

## 2023-03-21 NOTE — Assessment & Plan Note (Signed)
Well-controlled. Continue montelukast 10 mg daily, levalbuterol PRN, and Pulmicort daily.

## 2023-03-21 NOTE — Assessment & Plan Note (Signed)
Continue working with [employer]. I am okay with her doing a trial of bupropion, preferring this over Adderall or Ritalin for treatment of any potential binge eating disorder. She will discuss this with the physician at [facility].

## 2023-03-24 ENCOUNTER — Telehealth: Payer: Self-pay | Admitting: Family Medicine

## 2023-03-24 ENCOUNTER — Telehealth: Payer: Self-pay

## 2023-03-24 NOTE — Telephone Encounter (Signed)
Pt has new insurance will be uploading new insurance now pa for dexilant 60 thank you [facility] [place]

## 2023-03-24 NOTE — Telephone Encounter (Signed)
Called patient and she states that she didn't call this morning.  She doesn't need a refill on pantoprazole and will reach out to the Asthma doctor to have them do a prior auth for the Dexalant. Dm/cma

## 2023-03-24 NOTE — Telephone Encounter (Signed)
Prescription Request  03/24/2023  LOV: 03/21/2023  What is the name of the medication or equipment? pantoprazole (PROTONIX) 40 MG tablet [[id number]]   Have you contacted your pharmacy to request a refill? Yes   Which pharmacy would you like this sent to?    [id number] - [place], [place] - [address] [place] [address] Phone: [phone] Fax: [phone]   Patient notified that their request is being sent to the clinical staff for review and that they should receive a response within 2 business days.   Please advise at Mobile [phone] (mobile)

## 2023-03-25 ENCOUNTER — Other Ambulatory Visit (HOSPITAL_COMMUNITY): Payer: Self-pay

## 2023-03-25 DIAGNOSIS — D806 Antibody deficiency with near-normal immunoglobulins or with hyperimmunoglobulinemia: Secondary | ICD-10-CM | POA: Diagnosis not present

## 2023-03-25 NOTE — Telephone Encounter (Signed)
Can we clarify what patient is taking-recent notes show patient was prescribed Protonix in replace of Omeprazole and is now requesting a PA for Dexilant.

## 2023-03-25 NOTE — Telephone Encounter (Signed)
Pt was on protonix until we could get dexilant approved she has failed all other reflux meds

## 2023-03-27 ENCOUNTER — Telehealth: Payer: Self-pay

## 2023-03-27 ENCOUNTER — Other Ambulatory Visit (HOSPITAL_COMMUNITY): Payer: Self-pay

## 2023-03-27 NOTE — Telephone Encounter (Signed)
PA request has been Submitted. New Encounter created for follow up. For additional info see Pharmacy Prior Auth telephone encounter from 10/10.

## 2023-03-27 NOTE — Telephone Encounter (Signed)
*  Asthma/Allergy  Pharmacy Patient Advocate Encounter   Received notification from Pt Calls Messages that prior authorization for Dexlansoprazole 60MG  dr capsules  is required/requested.   Insurance verification completed.   The patient is insured through [facility] .   Per test claim: PA required; PA started via CoverMyMeds. KEY [id number] . Waiting for clinical questions to populate.    *patient is currently on pantoprazole while PA for dexlansoprazole is being processed

## 2023-04-01 ENCOUNTER — Telehealth: Payer: Self-pay

## 2023-04-01 NOTE — Telephone Encounter (Signed)
Pt is looking for updates on this pa.

## 2023-04-01 NOTE — Telephone Encounter (Signed)
Pa submitted thru cover my meds fr xopenex key [id number]

## 2023-04-02 DIAGNOSIS — D806 Antibody deficiency with near-normal immunoglobulins or with hyperimmunoglobulinemia: Secondary | ICD-10-CM | POA: Diagnosis not present

## 2023-04-02 NOTE — Telephone Encounter (Signed)
Pharmacy informed.

## 2023-04-02 NOTE — Telephone Encounter (Signed)
Pharmacy Patient Advocate Encounter  Received notification from [facility] that Prior Authorization for Dexlansoprazole 60MG  dr capsules has been APPROVED from 03-27-2023 to 03-26-2024   PA #/Case ID/Reference #: [facility]

## 2023-04-04 ENCOUNTER — Other Ambulatory Visit: Payer: Self-pay

## 2023-04-04 MED ORDER — LEVALBUTEROL TARTRATE 45 MCG/ACT IN AERO: 2.0000 | INHALATION_SPRAY | Freq: Four times a day (QID) | RESPIRATORY_TRACT | 0 refills | Status: DC | PRN

## 2023-04-19 ENCOUNTER — Other Ambulatory Visit: Payer: Self-pay | Admitting: Student

## 2023-04-24 ENCOUNTER — Other Ambulatory Visit: Payer: Self-pay

## 2023-04-24 MED ORDER — ASMANEX (120 METERED DOSES) 220 MCG/ACT IN AEPB: 2.0000 | INHALATION_SPRAY | Freq: Two times a day (BID) | RESPIRATORY_TRACT | 5 refills | Status: DC

## 2023-04-25 ENCOUNTER — Telehealth: Payer: Self-pay

## 2023-04-25 NOTE — Telephone Encounter (Signed)
[facility] Nursing orders - DOB verified- have been signed and faxed to [phone] - [phone].

## 2023-04-29 DIAGNOSIS — D806 Antibody deficiency with near-normal immunoglobulins or with hyperimmunoglobulinemia: Secondary | ICD-10-CM | POA: Diagnosis not present

## 2023-05-29 DIAGNOSIS — D806 Antibody deficiency with near-normal immunoglobulins or with hyperimmunoglobulinemia: Secondary | ICD-10-CM | POA: Diagnosis not present

## 2023-06-05 DIAGNOSIS — J029 Acute pharyngitis, unspecified: Secondary | ICD-10-CM | POA: Diagnosis not present

## 2023-06-05 DIAGNOSIS — J020 Streptococcal pharyngitis: Secondary | ICD-10-CM | POA: Diagnosis not present

## 2023-06-07 ENCOUNTER — Telehealth: Payer: Self-pay | Admitting: Allergy & Immunology

## 2023-06-07 MED ORDER — CEFDINIR 300 MG PO CAPS: 300.0000 mg | ORAL_CAPSULE | Freq: Two times a day (BID) | ORAL | 0 refills | Status: DC

## 2023-06-07 MED ORDER — PREDNISONE 10 MG PO TABS: ORAL_TABLET | ORAL | 0 refills | Status: DC

## 2023-06-07 NOTE — Telephone Encounter (Signed)
Patient called reporting that she was diagnosed with Strep throat earlier this week. She was placed on azithromycin, but she has continued to feel rather ill. She is still having a lot of congestion and sinus pain. She is having marked postnasal drip.   I am sending in a course of prednisone as well as cefdinir, which still has Strep coverage. I also recommended that she use budesonide nasal rinses in combination with Afrin for 2-3 days.   Malachi Bonds, MD [facility]

## 2023-06-12 ENCOUNTER — Encounter: Payer: Self-pay | Admitting: Family

## 2023-06-12 ENCOUNTER — Ambulatory Visit: Payer: BC Managed Care – PPO | Admitting: Family

## 2023-06-12 VITALS — BP 148/82 | HR 100 | Temp 98.5°F | Ht 68.0 in | Wt 302.0 lb

## 2023-06-12 DIAGNOSIS — J329 Chronic sinusitis, unspecified: Secondary | ICD-10-CM | POA: Diagnosis not present

## 2023-06-12 NOTE — Progress Notes (Signed)
Acute Office Visit  Subjective:     Patient ID: Nicole Mendoza, female    DOB: May 29, 1991, 32 y.o.   MRN: [id number]  Chief Complaint  Patient presents with  . Sinusitis    Stuffy and dry nose, dizziness, covid test yesterday was negative    HPI Patient is in today with persistent sinusitis.  Currently on prednisone and cefdinir for treatment prescribed by her immunologist.  The prednisone is causing her to be more emotional she wishes to come off of it if possible.  She also needs a note to return back to work.  Denies any fever.  Would like a COVID test done in office.  She had a negative COVID yesterday.  Review of Systems  Constitutional:  Positive for malaise/fatigue. Negative for chills and fever.  HENT:  Positive for congestion.        Sinus congestion and loss of smell  Respiratory:  Negative for hemoptysis.   Cardiovascular: Negative.   Musculoskeletal: Negative.   Neurological: Negative.   Endo/Heme/Allergies: Negative.   Psychiatric/Behavioral: Negative.    All other systems reviewed and are negative.  Past Medical History:  Diagnosis Date  . Allergic rhinitis   . Allergy   . Angio-edema   . Asthma   . Dysmenorrhea   . Fatty liver   . GERD (gastroesophageal reflux disease)   . Hypertension   . Recurrent upper respiratory infection (URI)   . Sleep apnea   . Tachycardia     Social History   Socioeconomic History  . Marital status: Married    Spouse name: Greig Castilla  . Number of children: 0  . Years of education: Not on file  . Highest education level: Not on file  Occupational History  . Occupation: Armed forces technical officer  Tobacco Use  . Smoking status: Never  . Smokeless tobacco: Never  Vaping Use  . Vaping status: Never Used  Substance and Sexual Activity  . Alcohol use: No    Alcohol/week: 0.0 standard drinks of alcohol  . Drug use: No  . Sexual activity: Yes    Birth control/protection: Pill  Other Topics Concern  . Not on file  Social History Narrative   . Not on file   Social Drivers of Health   Financial Resource Strain: Not on file  Food Insecurity: Not on file  Transportation Needs: Not on file  Physical Activity: Not on file  Stress: Not on file  Social Connections: Unknown (10/16/2022)   Received from [facility], [facility]   Social Network   . Social Network: Not on file  Intimate Partner Violence: Unknown (10/16/2022)   Received from [facility], [facility]   HITS   . Physically Hurt: Not on file   . Insult or Talk Down To: Not on file   . Threaten Physical Harm: Not on file   . Scream or Curse: Not on file    Past Surgical History:  Procedure Laterality Date  . NO PAST SURGERIES      Family History  Adopted: Yes  Problem Relation Age of Onset  . Asthma Mother   . Diabetes Mother   . High blood pressure Mother   . Heart disease Mother   . Bipolar disorder Mother   . Sleep apnea Mother   . Obesity Mother   . Allergic rhinitis Father   . Asthma Father   . Diabetes Father   . High blood pressure Father   . Hyperlipidemia Father   . Obesity Father   .  Allergic rhinitis Sister   . Asthma Sister   . Angioedema Neg Hx   . Atopy Neg Hx   . Eczema Neg Hx   . Immunodeficiency Neg Hx   . Urticaria Neg Hx     Allergies  Allergen Reactions  . Budesonide-Formoterol Fumarate Other (See Comments)    Causes Hypertension  . Other     Tree nuts  . Penicillins Hives  . Raspberry Hives  . Spiriva Respimat [Tiotropium Bromide Monohydrate] Other (See Comments)    Pt states medication causes dizziness  . Albuterol Palpitations  . Losartan Rash    Current Outpatient Medications on File Prior to Visit  Medication Sig Dispense Refill  . Ascorbic Acid (SUPER C COMPLEX PO)     . budesonide (PULMICORT) 0.5 MG/2ML nebulizer solution Take 2 mLs (0.5 mg total) by nebulization as needed. 60 mL 2  . cefdinir (OMNICEF) 300 MG capsule Take 1 capsule (300 mg total) by mouth 2 (two) times daily for 10 days. 20 capsule  0  . cetirizine (ZYRTEC) 10 MG tablet Take 10 mg by mouth daily.    . Cholecalciferol 100 MCG (4000 UT) CAPS Take 1 capsule (4,000 Units total) by mouth daily. 30 capsule 0  . dexlansoprazole (DEXILANT) 60 MG capsule TAKE 1 CAPSULE BY MOUTH EVERY DAY **PA** 90 capsule 1  . EPINEPHrine 0.3 mg/0.3 mL IJ SOAJ injection     . ferrous sulfate 325 (65 FE) MG tablet 3 (three) times a week.     . fluticasone (FLONASE) 50 MCG/ACT nasal spray Place 2 sprays into both nostrils daily. 16 g 5  . hydrOXYzine (ATARAX) 25 MG tablet Take 1 tablet (25 mg total) by mouth every 6 (six) hours as needed for anxiety (allergies). 30 tablet 1  . ipratropium (ATROVENT) 0.02 % nebulizer solution Take 1.25 mLs (0.25 mg total) by nebulization 4 (four) times daily. Mix 0.5 vial in with levalbuterol nebulizer and can use every 6 hours as needed for shortness of breath and wheezing. (Patient taking differently: Take 0.25 mg by nebulization as needed. Mix 0.5 vial in with levalbuterol nebulizer and can use every 6 hours as needed for shortness of breath and wheezing.) 75 mL 3  . levalbuterol (XOPENEX HFA) 45 MCG/ACT inhaler Inhale 2 puffs into the lungs every 6 (six) hours as needed. 15 g 0  . levalbuterol (XOPENEX) 1.25 MG/3ML nebulizer solution USE 1 VIAL BY NEBULIZATION EVERY 6 (SIX) HOURS AS NEEDED FOR WHEEZING. 540 mL 1  . Levonorgestrel-Ethinyl Estradiol (AMETHIA) 0.1-0.02 & 0.01 MG tablet Take 1 tablet by mouth daily.    . metFORMIN (GLUCOPHAGE-XR) 500 MG 24 hr tablet Take 1 tablet (500 mg total) by mouth daily with breakfast. 30 tablet 0  . metoprolol succinate (TOPROL-XL) 100 MG 24 hr tablet TAKE 1 TABLET BY MOUTH EVERY DAY WITH OR IMMEDIATELY FOLLOWING A MEAL 90 tablet 3  . metoprolol tartrate (LOPRESSOR) 50 MG tablet TAKE 1 TABLET (50 MG TOTAL) BY MOUTH AS NEEDED (FOR PALPITATIONS). 90 tablet 0  . mometasone (ASMANEX, 120 METERED DOSES,) 220 MCG/ACT inhaler Inhale 2 puffs into the lungs 2 (two) times daily. 1 each 5  .  montelukast (SINGULAIR) 10 MG tablet TAKE 1 TABLET BY MOUTH EVERY DAY 90 tablet 1  . ondansetron (ZOFRAN) 4 MG tablet Take 1 tablet (4 mg total) by mouth every 8 (eight) hours as needed for nausea or vomiting. 20 tablet 0  . Prenatal Vit-Fe Fumarate-FA (MULTIVITAMIN-PRENATAL) 27-0.8 MG TABS tablet Take 1 tablet by mouth daily at  12 noon.    . Probiotic Product (PHILLIPS COLON HEALTH) CAPS     . famotidine (PEPCID) 40 MG tablet Take 1 tablet (40 mg total) by mouth daily. 90 tablet 2  . pantoprazole (PROTONIX) 40 MG tablet Take 1 tablet (40 mg total) by mouth 2 (two) times daily. 60 tablet 3  . predniSONE (DELTASONE) 10 MG tablet Take one tablet twice daily for seven days. (Patient not taking: Reported on 06/12/2023) 14 tablet 0  . sucralfate (CARAFATE) 1 g tablet Take 1 tablet (1 g total) by mouth 4 (four) times daily -  with meals and at bedtime. 120 tablet 2   No current facility-administered medications on file prior to visit.    BP (!) 148/82 (BP Location: Left Arm, Patient Position: Sitting, Cuff Size: Normal)   Pulse 100   Temp 98.5 F (36.9 C) (Oral)   Ht 5\' 8"  (1.727 m)   Wt (!) 302 lb (137 kg)   LMP 06/05/2023 (Exact Date)   SpO2 99%   BMI 45.92 kg/m chart     Objective:    BP (!) 148/82 (BP Location: Left Arm, Patient Position: Sitting, Cuff Size: Normal)   Pulse 100   Temp 98.5 F (36.9 C) (Oral)   Ht 5\' 8"  (1.727 m)   Wt (!) 302 lb (137 kg)   LMP 06/05/2023 (Exact Date)   SpO2 99%   BMI 45.92 kg/m    Rapid COVID is negative  Physical Exam Vitals and nursing note reviewed.  Constitutional:      Appearance: Normal appearance. She is obese.  HENT:     Right Ear: Tympanic membrane, ear canal and external ear normal.     Left Ear: Tympanic membrane, ear canal and external ear normal.     Nose: Nose normal.     Mouth/Throat:     Mouth: Mucous membranes are moist.  Cardiovascular:     Rate and Rhythm: Normal rate.     Pulses: Normal pulses.     Heart sounds:  Normal heart sounds.  Pulmonary:     Effort: Pulmonary effort is normal.     Breath sounds: Normal breath sounds.  Musculoskeletal:        General: Normal range of motion.  Skin:    General: Skin is warm and dry.  Neurological:     General: No focal deficit present.     Mental Status: She is alert and oriented to person, place, and time. Mental status is at baseline.  Psychiatric:        Mood and Affect: Mood normal.        Behavior: Behavior normal.  Past Medical History:  Diagnosis Date  . Allergic rhinitis   . Allergy   . Angio-edema   . Asthma   . Dysmenorrhea   . Fatty liver   . GERD (gastroesophageal reflux disease)   . Hypertension   . Recurrent upper respiratory infection (URI)   . Sleep apnea   . Tachycardia     Social History   Socioeconomic History  . Marital status: Married    Spouse name: Greig Castilla  . Number of children: 0  . Years of education: Not on file  . Highest education level: Not on file  Occupational History  . Occupation: Armed forces technical officer  Tobacco Use  . Smoking status: Never  . Smokeless tobacco: Never  Vaping Use  . Vaping status: Never Used  Substance and Sexual Activity  . Alcohol use: No    Alcohol/week: 0.0 standard drinks  of alcohol  . Drug use: No  . Sexual activity: Yes    Birth control/protection: Pill  Other Topics Concern  . Not on file  Social History Narrative  . Not on file   Social Drivers of Health   Financial Resource Strain: Not on file  Food Insecurity: Not on file  Transportation Needs: Not on file  Physical Activity: Not on file  Stress: Not on file  Social Connections: Unknown (10/16/2022)   Received from [facility], [facility]   Social Network   . Social Network: Not on file  Intimate Partner Violence: Unknown (10/16/2022)   [facility] from [facility], [facility]   HITS   . Physically Hurt: Not on file   . Insult or Talk Down To: Not on file   . Threaten Physical Harm: Not on file   . Scream or Curse:  Not on file    Past Surgical History:  Procedure Laterality Date  . NO PAST SURGERIES      Family History  Adopted: Yes  Problem Relation Age of Onset  . Asthma Mother   . Diabetes Mother   . High blood pressure Mother   . Heart disease Mother   . Bipolar disorder Mother   . Sleep apnea Mother   . Obesity Mother   . Allergic rhinitis Father   . Asthma Father   . Diabetes Father   . High blood pressure Father   . Hyperlipidemia Father   . Obesity Father   . Allergic rhinitis Sister   . Asthma Sister   . Angioedema Neg Hx   . Atopy Neg Hx   . Eczema Neg Hx   . Immunodeficiency Neg Hx   . Urticaria Neg Hx     Allergies  Allergen Reactions  . Budesonide-Formoterol Fumarate Other (See Comments)    Causes Hypertension  . Other     Tree nuts  . Penicillins Hives  . Raspberry Hives  . Spiriva Respimat [Tiotropium Bromide Monohydrate] Other (See Comments)    Pt states medication causes dizziness  . Albuterol Palpitations  . Losartan Rash    Current Outpatient Medications on File Prior to Visit  Medication Sig Dispense Refill  . Ascorbic Acid (SUPER C COMPLEX PO)     . budesonide (PULMICORT) 0.5 MG/2ML nebulizer solution Take 2 mLs (0.5 mg total) by nebulization as needed. 60 mL 2  . cefdinir (OMNICEF) 300 MG capsule Take 1 capsule (300 mg total) by mouth 2 (two) times daily for 10 days. 20 capsule 0  . cetirizine (ZYRTEC) 10 MG tablet Take 10 mg by mouth daily.    . Cholecalciferol 100 MCG (4000 UT) CAPS Take 1 capsule (4,000 Units total) by mouth daily. 30 capsule 0  . dexlansoprazole (DEXILANT) 60 MG capsule TAKE 1 CAPSULE BY MOUTH EVERY DAY **PA** 90 capsule 1  . EPINEPHrine 0.3 mg/0.3 mL IJ SOAJ injection     . ferrous sulfate 325 (65 FE) MG tablet 3 (three) times a week.     . fluticasone (FLONASE) 50 MCG/ACT nasal spray Place 2 sprays into both nostrils daily. 16 g 5  . hydrOXYzine (ATARAX) 25 MG tablet Take 1 tablet (25 mg total) by mouth every 6 (six) hours as  needed for anxiety (allergies). 30 tablet 1  . ipratropium (ATROVENT) 0.02 % nebulizer solution Take 1.25 mLs (0.25 mg total) by nebulization 4 (four) times daily. Mix 0.5 vial in with levalbuterol nebulizer and can use every 6 hours as needed for shortness of breath  and wheezing. (Patient taking differently: Take 0.25 mg by nebulization as needed. Mix 0.5 vial in with levalbuterol nebulizer and can use every 6 hours as needed for shortness of breath and wheezing.) 75 mL 3  . levalbuterol (XOPENEX HFA) 45 MCG/ACT inhaler Inhale 2 puffs into the lungs every 6 (six) hours as needed. 15 g 0  . levalbuterol (XOPENEX) 1.25 MG/3ML nebulizer solution USE 1 VIAL BY NEBULIZATION EVERY 6 (SIX) HOURS AS NEEDED FOR WHEEZING. 540 mL 1  . Levonorgestrel-Ethinyl Estradiol (AMETHIA) 0.1-0.02 & 0.01 MG tablet Take 1 tablet by mouth daily.    . metFORMIN (GLUCOPHAGE-XR) 500 MG 24 hr tablet Take 1 tablet (500 mg total) by mouth daily with breakfast. 30 tablet 0  . metoprolol succinate (TOPROL-XL) 100 MG 24 hr tablet TAKE 1 TABLET BY MOUTH EVERY DAY WITH OR IMMEDIATELY FOLLOWING A MEAL 90 tablet 3  . metoprolol tartrate (LOPRESSOR) 50 MG tablet TAKE 1 TABLET (50 MG TOTAL) BY MOUTH AS NEEDED (FOR PALPITATIONS). 90 tablet 0  . mometasone (ASMANEX, 120 METERED DOSES,) 220 MCG/ACT inhaler Inhale 2 puffs into the lungs 2 (two) times daily. 1 each 5  . montelukast (SINGULAIR) 10 MG tablet TAKE 1 TABLET BY MOUTH EVERY DAY 90 tablet 1  . ondansetron (ZOFRAN) 4 MG tablet Take 1 tablet (4 mg total) by mouth every 8 (eight) hours as needed for nausea or vomiting. 20 tablet 0  . Prenatal Vit-Fe Fumarate-FA (MULTIVITAMIN-PRENATAL) 27-0.8 MG TABS tablet Take 1 tablet by mouth daily at 12 noon.    . Probiotic Product (PHILLIPS COLON HEALTH) CAPS     . famotidine (PEPCID) 40 MG tablet Take 1 tablet (40 mg total) by mouth daily. 90 tablet 2  . pantoprazole (PROTONIX) 40 MG tablet Take 1 tablet (40 mg total) by mouth 2 (two) times daily.  60 tablet 3  . predniSONE (DELTASONE) 10 MG tablet Take one tablet twice daily for seven days. (Patient not taking: Reported on 06/12/2023) 14 tablet 0  . sucralfate (CARAFATE) 1 g tablet Take 1 tablet (1 g total) by mouth 4 (four) times daily -  with meals and at bedtime. 120 tablet 2   No current facility-administered medications on file prior to visit.    BP (!) 148/82 (BP Location: Left Arm, Patient Position: Sitting, Cuff Size: Normal)   Pulse 100   Temp 98.5 F (36.9 C) (Oral)   Ht 5\' 8"  (1.727 m)   Wt (!) 302 lb (137 kg)   LMP 06/05/2023 (Exact Date)   SpO2 99%   BMI 45.92 kg/m chart  No results found for any visits on 06/12/23.      Assessment & Plan:   Problem List Items Addressed This Visit     Recurrent sinusitis - Primary  Continue current medications.  Rest.  Drink plenty of fluids.  Note provided today to return back to work on 06/16/2023.  No orders of the defined types were placed in this encounter.   No follow-ups on file.  Eulis Foster, FNP

## 2023-06-15 ENCOUNTER — Telehealth: Payer: Self-pay | Admitting: Allergy & Immunology

## 2023-06-15 DIAGNOSIS — D806 Antibody deficiency with near-normal immunoglobulins or with hyperimmunoglobulinemia: Secondary | ICD-10-CM

## 2023-06-15 NOTE — Telephone Encounter (Signed)
Patient reached out to me over the holiday with continued symptoms. We treated her with cefdinir and azithromycin as well as low dose prednisone with minimal relief. She thinks that a lot of this might be related to her vocal cord dysfunction. She is open to a second opinion, so we will refer her to see Dr. Thalia Party at [facility]. We are also sending genetic testing to [facility]. Ordered placed.   Nicole Bonds, MD [facility]

## 2023-06-17 ENCOUNTER — Encounter: Payer: Self-pay | Admitting: Allergy & Immunology

## 2023-06-17 ENCOUNTER — Ambulatory Visit (INDEPENDENT_AMBULATORY_CARE_PROVIDER_SITE_OTHER): Payer: BC Managed Care – PPO | Admitting: Allergy & Immunology

## 2023-06-17 ENCOUNTER — Other Ambulatory Visit: Payer: Self-pay

## 2023-06-17 VITALS — BP 142/82 | HR 108 | Temp 100.4°F | Resp 12

## 2023-06-17 DIAGNOSIS — D806 Antibody deficiency with near-normal immunoglobulins or with hyperimmunoglobulinemia: Secondary | ICD-10-CM | POA: Diagnosis not present

## 2023-06-17 DIAGNOSIS — J3089 Other allergic rhinitis: Secondary | ICD-10-CM

## 2023-06-17 DIAGNOSIS — J329 Chronic sinusitis, unspecified: Secondary | ICD-10-CM

## 2023-06-17 DIAGNOSIS — J4540 Moderate persistent asthma, uncomplicated: Secondary | ICD-10-CM

## 2023-06-17 DIAGNOSIS — R053 Chronic cough: Secondary | ICD-10-CM

## 2023-06-17 DIAGNOSIS — K219 Gastro-esophageal reflux disease without esophagitis: Secondary | ICD-10-CM

## 2023-06-17 DIAGNOSIS — J302 Other seasonal allergic rhinitis: Secondary | ICD-10-CM

## 2023-06-17 DIAGNOSIS — J383 Other diseases of vocal cords: Secondary | ICD-10-CM

## 2023-06-17 NOTE — Progress Notes (Signed)
 FOLLOW UP  Date of Service/Encounter:  06/17/23   Assessment:   Mild persistent asthma, uncomplicated - well controlled at this point in time   Vocal cord dysfunction - followed by Dr. Delayne at [facility]   Recurrent infections - improved following Pneumovax, but then worsened (streptococcal avidity assay abnormal) - on Privigen  with a decrease in frequency of infections   Seasonal and perennial allergic rhinitis (trees, weeds, grasses and dust mites) - not well controlled    Gastroesophageal reflux disease - on Dexilant  and famotidine  and PRN carafate     Adverse food reactions - resolved with minimizing exposure to food additives and avoiding peanuts/tree nuts   Snoring with perceived poor sleep quality - improved with use of the CPAP   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 Jeannetta  Plan/Recommendations:   Asthma - Lung testing looks AWESOME SAUCE!  - We are not going to make any changes at this point in time.  - Daily controller medication(s): Singulair  10mg  daily and Asmanex  1-2 puffs twice daily  - Prior to physical activity: Xopenex  2 puffs 10-15 minutes before physical activity. - Rescue medications: Xopenex  4 puffs every 4-6 hours as needed - Changes during respiratory infections or worsening symptoms: Add on Pulmicort  0.5mg  to one treatment twice daily for TWO WEEKS. - Asthma control goals:  * Full participation in all desired activities (may need albuterol before activity) * Albuterol use two time or less a week on average (not counting use with activity) * Cough interfering with sleep two time or less a month * Oral steroids no more than once a year * No hospitalizations  Allergic rhinitis - Continue saline rinses several times a day - Continue cetirizine 10 mg once a day as needed for a runny nose or itch. - Continue Flonase  2 sprays in each nostril twice a day for nasal symptoms - Continue  with Astelin  two sprays per nostril twice daily in case this is related to postnasal drip.   Recurrent infections - We will continue with the Privagen once monthly. - I will talk to Dr. Tobie about you so he is aware of what is going on.  Reflux - Continue dietary and lifestyle modifications as listed below - Continue with Dexilant  as you are doing.  - Continue with famotidine  40mg  nightly. - Continue with a refill of the Carafate .   Vocal cord dysfunction - Continue with speech therapy at home lessons as you are doing.  - Continue with the belly breathing exercises. - I will send my Dr. Delayne.   Food allergy  - Continue to avoid tree nuts.  In case of an allergic reaction, take Benadryl 50 mg every 4 hours, and if life-threatening symptoms occur, inject with AuviQ 0.3 mg. - We will get that mixed tree nut challenge done at some point.   Return in about 3 months (around 09/15/2023).  Subjective:   Nicole Mendoza is a 32 y.o. female presenting today for follow up of  Chief Complaint  Patient presents with   Sinusitis    Nicole Mendoza has a history of the following: Patient Active Problem List   Diagnosis Date Noted   NAFLD (nonalcoholic fatty liver disease) 93/89/7975   Other fatigue 11/25/2022   Prediabetes 10/15/2022   Eating disorder 10/15/2022   BMI 45.0-49.9, adult (HCC) Current BMI 45.2 10/15/2022   Adjustment disorder with anxiety 06/12/2022   Vocal cord dysfunction 06/03/2022   Positive ANA (antinuclear antibody) 07/13/2021  Fibromyalgia 07/13/2021   Dysfunction of left eustachian tube 06/26/2021   Vertigo 06/26/2021   Obstructive sleep apnea 02/26/2021   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   Recurrent infections 12/20/2020   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 11/22/2020   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   Recurrent  sinusitis 11/09/2020   Pre-nodular edema of the vocal folds 11/09/2020   ASCUS of cervix with negative high risk HPV 07/10/2020   Cardiac murmur 09/02/2019   Tachycardia 09/01/2019   Salivary stone 09/01/2019   Anaphylactic shock due to adverse food reaction 01/22/2019   Seasonal and perennial allergic rhinitis 01/22/2019   Morbid obesity (HCC) 10/28/2017   Moderate persistent asthma without complication 07/29/2017   Essential hypertension 04/22/2017   Dysmenorrhea 06/26/2016   Menorrhagia with regular cycle 06/26/2016   Gastroesophageal reflux disease 12/22/2015   Vitamin D  deficiency 11/02/2014    History obtained from: chart review and patient.  Discussed the use of AI scribe software for clinical note transcription with the patient and/or guardian, who gave verbal consent to proceed.  Nicole Mendoza is a 32 y.o. female presenting for a sick visit.  She was last seen in May 2024.  At that time, her lung testing looked great.  We continue with Singulair  10 mg daily as well as Asmanex  1 to 2 puffs twice daily.  For her rhinitis, we continue with cetirizine as well as Flonase .  We did add on Astelin  2 sprays per nostril twice daily in case all of her symptoms are related to postnasal drip.  For her recurrent infections, she decided to continue with Privigen  monthly.  For her reflux, we continued with Dexilant  as well as famotidine  and Carafate  as needed.  She continued to use her speech therapy exercises at home.  She also continue to avoid tree nuts.  We did talk about doing a mixed tree nut challenge but she keeps changing the appointment and had to cancel a couple as well.  In the interim, she has had a rather eventful couple of weeks.  In mid December, she was diagnosed with strep throat.  She was placed on azithromycin  for treatment.  Despite this, she continued to have a lot of congestion and sinus pain as well as postnasal drip.  We sent in a low-dose course of prednisone  as well as cefdinir  to  treat any coexisting sinusitis that was not covered with the azithromycin . She contacted me a week later with continued symptoms.  She had already finished her cefdinir  and azithromycin .  She also was on her prednisone  which she was using with a dosing schedule that she came up with herself.  She is never really tolerated prednisone  very well, so she tends to change the doses so that she is getting the minimum amount possible to still have relief of her symptoms without the POTS flaring.  I recommended that she do budesonide  nasal rinses.  She already had budesonide  at home which she used her nebulizer, so she makes some with her nasal saline to see if this would help.  She was interested in a second opinion, although she made it very clear that she did not want to give up our office completely.  She also was open to genetic testing due to her immune deficiencies and multiple comorbidities.  The referral and lab work have all been ordered.  Since last visit, she thinks she  might have made some improvement.  Asthma/Respiratory Symptom History: The patient's respiratory symptoms include chest tightness and difficulty speaking for extended periods due to hoarseness. 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 The patient uses Asmanex  for asthma management and montelukast . The patient has a history of vocal cord dysfunction, which is suspected to be exacerbated by anxiety. The patient is scheduled for a follow-up with an otolaryngologist in February.  She is wondering if she can stop the prednisone  especially in light of the fact that her breathing test is completely normal today.  Allergic Rhinitis Symptom History: Since we last talked, she reports that she might be slowly getting better. The patient's sinus symptoms include a neon yellow nasal discharge, which was noted during a sinus rinse. The patient has recently completed a course of cefdinir  and  prednisone , and previously took azithromycin  for strep throat. Despite these treatments, the sinus symptoms persist.  She has not had a sinus CT in about 4 years.  She is open to getting another 1.  She has tried budesonide  nasal rinses, which reportedly caused headaches.  She remains on the Prevagen once a month.  She does feel like things overall have gotten better since starting the immunoglobulin replacement.  However, with any kind of illness, she tends to just hit the wall and have a marked loss of energy.  She is open to a second opinion.  She actually has an office visit scheduled with Dr. Kathrin Blanch at the end of January.  Food Allergy  Symptom History: She continues to avoid tree nuts.  She does not feel like she wants to do a mixed tree nut butter challenge at this point since this is the least of her worries right now.  GERD Symptom History: She remains on her Dexilant  and her famotidine .  She also has Carafate  that she has added over the past couple of weeks.  The patient also reports a worsening of fibromyalgia symptoms, with increased fatigue and functional impairment.  She is worried that she might have chronic fatigue syndrome.  She has not seen a specialist for this, but has been doing some reading.  The patient's overall condition appears to be significantly impacted by the current symptoms, leading to functional impairment and a decrease in quality of life. The patient is seeking further evaluation and treatment to manage these symptoms.  Otherwise, there have been no changes to her past medical history, surgical history, family history, or social history.    Review of systems otherwise negative other than that mentioned in the HPI.    Objective:   Blood pressure (!) 142/82, pulse (!) 108, temperature (!) 100.4 F (38 C), temperature source Temporal, resp. rate 12, last menstrual period 06/05/2023, SpO2 99%. There is no height or weight on file to calculate  BMI.    Physical Exam Vitals reviewed.  Constitutional:      Appearance: She is well-developed. She is obese.     Comments: Very talkative.  Not ill-appearing.  HENT:     Head: Normocephalic and atraumatic.     Right Ear: Tympanic membrane, ear canal and external ear normal.     Left Ear: Tympanic membrane, ear canal and external ear normal.     Nose: No nasal deformity, septal deviation, mucosal edema or rhinorrhea.     Right Turbinates: Enlarged, swollen and pale.     Left Turbinates: Enlarged, swollen and pale.     Right Sinus: No maxillary sinus  tenderness or frontal sinus tenderness.     Left Sinus: No maxillary sinus tenderness or frontal sinus tenderness.     Comments: No nasal polyps.    Mouth/Throat:     Lips: Pink.     Mouth: Mucous membranes are not pale and not dry.     Pharynx: Uvula midline. No pharyngeal swelling or oropharyngeal exudate.     Tonsils: 2+ on the right. 2+ on the left.  Eyes:     General: Lids are normal. No allergic shiner.       Right eye: No discharge.        Left eye: No discharge.     Conjunctiva/sclera:     Right eye: Right conjunctiva is not injected. No chemosis.    Left eye: Left conjunctiva is not injected. No chemosis.    Pupils: Pupils are equal, round, and reactive to light.  Cardiovascular:     Rate and Rhythm: Normal rate and regular rhythm.     Heart sounds: Normal heart sounds.  Pulmonary:     Effort: Pulmonary effort is normal. No tachypnea, accessory muscle usage or respiratory distress.     Breath sounds: Normal breath sounds. No wheezing, rhonchi or rales.     Comments: Moving air well in all lung fields.  No increased work of breathing. Chest:     Chest wall: No tenderness.  Lymphadenopathy:     Cervical: No cervical adenopathy.  Skin:    General: Skin is warm.     Capillary Refill: Capillary refill takes less than 2 seconds.     Coloration: Skin is not pale.     Findings: No abrasion, erythema, petechiae or rash. Rash  is not papular, urticarial or vesicular.     Comments: No eczematous or urticarial lesions noted.  Neurological:     Mental Status: She is alert.  Psychiatric:        Behavior: Behavior is cooperative.      Diagnostic studies:    Spirometry: results normal (FEV1: 2.87/80%, FVC: 3.68/85%, FEV1/FVC: 78%).    Spirometry consistent with normal pattern.   Allergy  Studies: none        Marty Shaggy, MD  [facility] 

## 2023-06-17 NOTE — Patient Instructions (Addendum)
 Asthma - Lung testing looks AWESOME SAUCE!  - We are not going to make any changes at this point in time.  - Daily controller medication(s): Singulair  10mg  daily and Asmanex  1-2 puffs twice daily  - Prior to physical activity: Xopenex  2 puffs 10-15 minutes before physical activity. - Rescue medications: Xopenex  4 puffs every 4-6 hours as needed - Changes during respiratory infections or worsening symptoms: Add on Pulmicort  0.5mg  to one treatment twice daily for TWO WEEKS. - Asthma control goals:  * Full participation in all desired activities (may need albuterol before activity) * Albuterol use two time or less a week on average (not counting use with activity) * Cough interfering with sleep two time or less a month * Oral steroids no more than once a year * No hospitalizations  Allergic rhinitis - Continue saline rinses several times a day - Continue cetirizine 10 mg once a day as needed for a runny nose or itch. - Continue Flonase  2 sprays in each nostril twice a day for nasal symptoms - Continue with Astelin  two sprays per nostril twice daily in case this is related to postnasal drip.   Recurrent infections - We will continue with the Privagen once monthly. - I will talk to Dr. Tobie about you so he is aware of what is going on.  Reflux - Continue dietary and lifestyle modifications as listed below - Continue with Dexilant  as you are doing.  - Continue with famotidine  40mg  nightly. - Continue with a refill of the Carafate .   Vocal cord dysfunction - Continue with speech therapy at home lessons as you are doing.  - Continue with the belly breathing exercises. - I will send my Dr. Delayne.   Food allergy  - Continue to avoid tree nuts.  In case of an allergic reaction, take Benadryl 50 mg every 4 hours, and if life-threatening symptoms occur, inject with AuviQ 0.3 mg. - We will get that mixed tree nut challenge done at some point.   Return in about 3 months (around  09/15/2023).   Please inform us  of any Emergency Department visits, hospitalizations, or changes in symptoms. Call us  before going to the ED for breathing or allergy  symptoms since we might be able to fit you in for a sick visit. Feel free to contact us  anytime with any questions, problems, or concerns.  It was a pleasure to see you again today!  Websites that have reliable patient information: 1. American Academy of Asthma, Allergy , and Immunology: www.aaaai.org 2. Food Allergy  Research and Education (FARE): foodallergy.org 3. Mothers of Asthmatics: http://www.asthmacommunitynetwork.org 4. American College of Allergy , Asthma, and Immunology: www.acaai.org   COVID-19 Vaccine Information can be found at: podexchange.nl For questions related to vaccine distribution or appointments, please email vaccine@ [email or web address] or call [phone].   We realize that you might be concerned about having an allergic reaction to the COVID19 vaccines. To help with that concern, WE ARE OFFERING THE COVID19 VACCINES IN OUR OFFICE! Ask the front desk for dates!     "Like" us  on Facebook and Instagram for our latest updates!      A healthy democracy works best when [employer] participate! Make sure you are registered to vote! If you have moved or changed any of your contact information, you will need to get this updated before voting!  In some cases, you MAY be able to register to vote online: [email or web address]

## 2023-06-18 LAB — CBC WITH DIFFERENTIAL/PLATELET
Basophils Absolute: 0.1 10*3/uL (ref 0.0–0.2)
Basos: 1 %
EOS (ABSOLUTE): 0.1 10*3/uL (ref 0.0–0.4)
Eos: 1 %
Hematocrit: 42.4 % (ref 34.0–46.6)
Hemoglobin: 14 g/dL (ref 11.1–15.9)
Immature Grans (Abs): 0.1 10*3/uL (ref 0.0–0.1)
Immature Granulocytes: 1 %
Lymphocytes Absolute: 2.7 10*3/uL (ref 0.7–3.1)
Lymphs: 20 %
MCH: 28.1 pg (ref 26.6–33.0)
MCHC: 33 g/dL (ref 31.5–35.7)
MCV: 85 fL (ref 79–97)
Monocytes Absolute: 0.9 10*3/uL (ref 0.1–0.9)
Monocytes: 7 %
Neutrophils Absolute: 9.3 10*3/uL — ABNORMAL HIGH (ref 1.4–7.0)
Neutrophils: 70 %
Platelets: 329 10*3/uL (ref 150–450)
RBC: 4.98 x10E6/uL (ref 3.77–5.28)
RDW: 12.7 % (ref 11.7–15.4)
WBC: 13.1 10*3/uL — ABNORMAL HIGH (ref 3.4–10.8)

## 2023-06-18 LAB — CMP14+EGFR
ALT: 40 [IU]/L — ABNORMAL HIGH (ref 0–32)
AST: 31 [IU]/L (ref 0–40)
Albumin: 4.3 g/dL (ref 3.9–4.9)
Alkaline Phosphatase: 95 [IU]/L (ref 44–121)
BUN/Creatinine Ratio: 20 (ref 9–23)
BUN: 14 mg/dL (ref 6–20)
Bilirubin Total: 0.4 mg/dL (ref 0.0–1.2)
CO2: 19 mmol/L — ABNORMAL LOW (ref 20–29)
Calcium: 9.3 mg/dL (ref 8.7–10.2)
Chloride: 102 mmol/L (ref 96–106)
Creatinine, Ser: 0.71 mg/dL (ref 0.57–1.00)
Globulin, Total: 3.3 g/dL (ref 1.5–4.5)
Glucose: 89 mg/dL (ref 70–99)
Potassium: 4.2 mmol/L (ref 3.5–5.2)
Sodium: 139 mmol/L (ref 134–144)
Total Protein: 7.6 g/dL (ref 6.0–8.5)
eGFR: 116 mL/min/{1.73_m2} (ref >59)

## 2023-06-19 ENCOUNTER — Encounter: Payer: Self-pay | Admitting: Family Medicine

## 2023-06-19 ENCOUNTER — Ambulatory Visit: Payer: BC Managed Care – PPO | Admitting: Family Medicine

## 2023-06-19 VITALS — BP 164/84 | HR 109 | Temp 99.1°F | Ht 68.0 in | Wt 301.8 lb

## 2023-06-19 DIAGNOSIS — R7989 Other specified abnormal findings of blood chemistry: Secondary | ICD-10-CM

## 2023-06-19 DIAGNOSIS — J0100 Acute maxillary sinusitis, unspecified: Secondary | ICD-10-CM | POA: Diagnosis not present

## 2023-06-19 DIAGNOSIS — R300 Dysuria: Secondary | ICD-10-CM | POA: Diagnosis not present

## 2023-06-19 LAB — POCT URINALYSIS DIPSTICK
Appearance: NORMAL
Bilirubin, UA: NEGATIVE
Blood, UA: NEGATIVE
Glucose, UA: NEGATIVE
Ketones, UA: NEGATIVE
Leukocytes, UA: NEGATIVE
Nitrite, UA: NEGATIVE
Odor: NORMAL
Protein, UA: NEGATIVE
Spec Grav, UA: 1.01 (ref 1.010–1.025)
Urobilinogen, UA: 0.2 U/dL
pH, UA: 7.5 (ref 5.0–8.0)

## 2023-06-19 MED ORDER — CEFDINIR 300 MG PO CAPS: 300.0000 mg | ORAL_CAPSULE | Freq: Two times a day (BID) | ORAL | 0 refills | Status: AC

## 2023-06-19 NOTE — Progress Notes (Signed)
 Established Patient Office Visit   Subjective:  Patient ID: Nicole Mendoza, female    DOB: 24-Sep-1990  Age: 33 y.o. MRN: [id number]  Chief Complaint  Patient presents with   Dysuria    Pain with urination    Dysuria    Encounter Diagnoses  Name Primary?   Acute non-recurrent maxillary sinusitis Yes   Elevated TSH    For follow-up status post diagnosis of strep throat on 12/19 treated with Zithromax .  On follow-up with her immunologist she was not improving and just finished a course of Omnicef  and prednisone .  She has since developed facial pressure in her right greater than left cheekbone with yellow rhinorrhea.  There is no dental discomfort fever or chills.  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  She suffers from periodic fatigue.  More recently she has had some burning with urination.  She denies vaginal discharge or itching.  She is married and lives at home with her husband and their 5 foster children.  TSH had been mildly elevated in recent past.  Ongoing issue with tachycardia.  She does not drink alcohol.  No caffeine in 2 weeks.  No chocolate.  She has no history of hypertension.  She has been on prednisone  until yesterday.  Would you   Review of Systems  Constitutional:  Positive for malaise/fatigue.  HENT:  Positive for congestion and sinus pain. Negative for sore throat.   Eyes:  Negative for blurred vision, discharge and redness.  Respiratory: Negative.    Cardiovascular: Negative.   Gastrointestinal:  Negative for abdominal pain.  Genitourinary:  Positive for dysuria.  Musculoskeletal: Negative.  Negative for myalgias.  Skin:  Negative for rash.  Neurological:  Negative for tingling, loss of consciousness and weakness.  Endo/Heme/Allergies:  Negative for polydipsia.     Current Outpatient Medications:    Ascorbic Acid (SUPER C COMPLEX PO), , Disp: , Rfl:    budesonide  (PULMICORT ) 0.5 MG/2ML nebulizer  solution, Take 2 mLs (0.5 mg total) by nebulization as needed., Disp: 60 mL, Rfl: 2   cefdinir  (OMNICEF ) 300 MG capsule, Take 1 capsule (300 mg total) by mouth 2 (two) times daily for 10 days., Disp: 20 capsule, Rfl: 0   cetirizine (ZYRTEC) 10 MG tablet, Take 10 mg by mouth daily., Disp: , Rfl:    Cholecalciferol  100 MCG (4000 UT) CAPS, Take 1 capsule (4,000 Units total) by mouth daily., Disp: 30 capsule, Rfl: 0   EPINEPHrine  0.3 mg/0.3 mL IJ SOAJ injection, , Disp: , Rfl:    ferrous sulfate 325 (65 FE) MG tablet, 3 (three) times a week. , Disp: , Rfl:    fluticasone  (FLONASE ) 50 MCG/ACT nasal spray, Place 2 sprays into both nostrils daily., Disp: 16 g, Rfl: 5   hydrOXYzine  (ATARAX ) 25 MG tablet, Take 1 tablet (25 mg total) by mouth every 6 (six) hours as needed for anxiety (allergies)., Disp: 30 tablet, Rfl: 1   ipratropium (ATROVENT ) 0.02 % nebulizer solution, Take 1.25 mLs (0.25 mg total) by nebulization 4 (four) times daily. Mix 0.5 vial in with levalbuterol  nebulizer and can use every 6 hours as needed for shortness of breath and wheezing. (Patient taking differently: Take 0.25 mg by nebulization as needed. Mix 0.5 vial in with levalbuterol  nebulizer and can use every 6 hours as needed for shortness of breath and wheezing.), Disp: 75 mL, Rfl: 3   levalbuterol  (XOPENEX  HFA) 45 MCG/ACT inhaler, Inhale 2 puffs into the lungs every 6 (six) hours  as needed., Disp: 15 g, Rfl: 0   levalbuterol  (XOPENEX ) 1.25 MG/3ML nebulizer solution, USE 1 VIAL BY NEBULIZATION EVERY 6 (SIX) HOURS AS NEEDED FOR WHEEZING., Disp: 540 mL, Rfl: 1   Levonorgestrel-Ethinyl Estradiol  (AMETHIA) 0.1-0.02 & 0.01 MG tablet, Take 1 tablet by mouth daily., Disp: , Rfl:    metoprolol  succinate (TOPROL -XL) 100 MG 24 hr tablet, TAKE 1 TABLET BY MOUTH EVERY DAY WITH OR IMMEDIATELY FOLLOWING A MEAL, Disp: 90 tablet, Rfl: 3   metoprolol  tartrate (LOPRESSOR ) 50 MG tablet, TAKE 1 TABLET (50 MG TOTAL) BY MOUTH AS NEEDED (FOR PALPITATIONS).,  Disp: 90 tablet, Rfl: 0   mometasone  (ASMANEX , 120 METERED DOSES,) 220 MCG/ACT inhaler, Inhale 2 puffs into the lungs 2 (two) times daily., Disp: 1 each, Rfl: 5   montelukast  (SINGULAIR ) 10 MG tablet, TAKE 1 TABLET BY MOUTH EVERY DAY, Disp: 90 tablet, Rfl: 1   ondansetron  (ZOFRAN ) 4 MG tablet, Take 1 tablet (4 mg total) by mouth every 8 (eight) hours as needed for nausea or vomiting., Disp: 20 tablet, Rfl: 0   Prenatal Vit-Fe Fumarate-FA (MULTIVITAMIN-PRENATAL) 27-0.8 MG TABS tablet, Take 1 tablet by mouth daily at 12 noon., Disp: , Rfl:    Probiotic Product (PHILLIPS COLON HEALTH) CAPS, , Disp: , Rfl:    famotidine  (PEPCID ) 40 MG tablet, Take 1 tablet (40 mg total) by mouth daily., Disp: 90 tablet, Rfl: 2   pantoprazole  (PROTONIX ) 40 MG tablet, Take 1 tablet (40 mg total) by mouth 2 (two) times daily., Disp: 60 tablet, Rfl: 3   predniSONE  (DELTASONE ) 10 MG tablet, Take one tablet twice daily for seven days. (Patient not taking: Reported on 06/19/2023), Disp: 14 tablet, Rfl: 0   sucralfate  (CARAFATE ) 1 g tablet, Take 1 tablet (1 g total) by mouth 4 (four) times daily -  with meals and at bedtime., Disp: 120 tablet, Rfl: 2   Objective:     BP (!) 164/84   Pulse (!) 109   Temp 99.1 F (37.3 C)   Ht 5' 8 (1.727 m)   Wt (!) 301 lb 12.8 oz (136.9 kg)   LMP 06/05/2023 (Exact Date)   SpO2 98%   BMI 45.89 kg/m    Physical Exam Constitutional:      General: She is not in acute distress.    Appearance: Normal appearance. She is not ill-appearing, toxic-appearing or diaphoretic.  HENT:     Head: Normocephalic and atraumatic.      Right Ear: Tympanic membrane, ear canal and external ear normal.     Left Ear: Tympanic membrane, ear canal and external ear normal.     Mouth/Throat:     Mouth: Mucous membranes are moist.     Pharynx: Oropharynx is clear. No pharyngeal swelling, oropharyngeal exudate, posterior oropharyngeal erythema or postnasal drip.  Eyes:     General: No scleral icterus.        Right eye: No discharge.        Left eye: No discharge.     Extraocular Movements: Extraocular movements intact.     Conjunctiva/sclera: Conjunctivae normal.     Pupils: Pupils are equal, round, and reactive to light.  Cardiovascular:     Rate and Rhythm: Normal rate and regular rhythm.  Pulmonary:     Effort: Pulmonary effort is normal. No respiratory distress.     Breath sounds: Normal breath sounds. No wheezing or rales.  Abdominal:     General: Bowel sounds are normal.  Musculoskeletal:     Cervical back: No rigidity or  tenderness.  Lymphadenopathy:     Cervical: No cervical adenopathy.  Skin:    General: Skin is warm and dry.  Neurological:     Mental Status: She is alert and oriented to person, place, and time.  Psychiatric:        Mood and Affect: Mood normal.        Behavior: Behavior normal.      No results found for any visits on 06/19/23.    The ASCVD Risk score (Arnett DK, et al., 2019) failed to calculate for the following reasons:   The 2019 ASCVD risk score is only valid for ages 9 to 74    Assessment & Plan:   Acute non-recurrent maxillary sinusitis -     Cefdinir ; Take 1 capsule (300 mg total) by mouth 2 (two) times daily for 10 days.  Dispense: 20 capsule; Refill: 0  Elevated TSH    Return Has follow-up scheduled with Dr. Thedora on Monday., for Please continue Flonase ..   Urinalysis was normal.  10-day course of cefdinir  for maxillary sinusitis. Elsie Sim Lent, MD

## 2023-06-19 NOTE — Addendum Note (Signed)
 Addended by: Solon Palm on: 06/19/2023 05:20 PM   Modules accepted: Orders

## 2023-06-20 ENCOUNTER — Telehealth: Payer: Self-pay | Admitting: *Deleted

## 2023-06-20 ENCOUNTER — Other Ambulatory Visit: Payer: Self-pay

## 2023-06-20 MED ORDER — LEVALBUTEROL TARTRATE 45 MCG/ACT IN AERO: 2.0000 | INHALATION_SPRAY | Freq: Four times a day (QID) | RESPIRATORY_TRACT | 0 refills | Status: DC | PRN

## 2023-06-20 NOTE — Telephone Encounter (Signed)
-----   Message from [facility] Estelle F sent at 06/20/2023  8:39 AM EST -----  ----- Message ----- From: Alfonse Spruce, MD Sent: 06/17/2023   1:28 PM EST To: [facility]  Sinus CT ordered

## 2023-06-20 NOTE — Telephone Encounter (Signed)
 I called patients insurance and they stated that a Prior Authorization was not required. Reference number is Heron B per the woman I spoke with over the phone with her insurance company. I called and spoke to [facility] and they are going to reach out to the patient to get her scheduled.

## 2023-06-20 NOTE — Telephone Encounter (Signed)
 Great thank you!

## 2023-06-22 ENCOUNTER — Encounter (HOSPITAL_BASED_OUTPATIENT_CLINIC_OR_DEPARTMENT_OTHER): Payer: Self-pay

## 2023-06-22 ENCOUNTER — Other Ambulatory Visit: Payer: Self-pay

## 2023-06-22 ENCOUNTER — Emergency Department (HOSPITAL_BASED_OUTPATIENT_CLINIC_OR_DEPARTMENT_OTHER)
Admission: EM | Admit: 2023-06-22 | Discharge: 2023-06-22 | Disposition: A | Discharge status: Home / Self Care | Payer: BC Managed Care – PPO | Attending: Emergency Medicine | Admitting: Emergency Medicine

## 2023-06-22 ENCOUNTER — Emergency Department (HOSPITAL_BASED_OUTPATIENT_CLINIC_OR_DEPARTMENT_OTHER): Payer: BC Managed Care – PPO

## 2023-06-22 ENCOUNTER — Telehealth: Payer: Self-pay | Admitting: Allergy & Immunology

## 2023-06-22 DIAGNOSIS — J329 Chronic sinusitis, unspecified: Secondary | ICD-10-CM

## 2023-06-22 DIAGNOSIS — J019 Acute sinusitis, unspecified: Secondary | ICD-10-CM | POA: Diagnosis not present

## 2023-06-22 DIAGNOSIS — U071 COVID-19: Secondary | ICD-10-CM | POA: Diagnosis not present

## 2023-06-22 DIAGNOSIS — R000 Tachycardia, unspecified: Secondary | ICD-10-CM | POA: Diagnosis not present

## 2023-06-22 DIAGNOSIS — R519 Headache, unspecified: Secondary | ICD-10-CM | POA: Diagnosis not present

## 2023-06-22 DIAGNOSIS — Z20822 Contact with and (suspected) exposure to covid-19: Secondary | ICD-10-CM | POA: Insufficient documentation

## 2023-06-22 DIAGNOSIS — E860 Dehydration: Secondary | ICD-10-CM | POA: Diagnosis not present

## 2023-06-22 DIAGNOSIS — R42 Dizziness and giddiness: Secondary | ICD-10-CM

## 2023-06-22 LAB — URINALYSIS, ROUTINE W REFLEX MICROSCOPIC
Bilirubin Urine: NEGATIVE
Glucose, UA: NEGATIVE mg/dL
Hgb urine dipstick: NEGATIVE
Ketones, ur: NEGATIVE mg/dL
Leukocytes,Ua: NEGATIVE
Nitrite: NEGATIVE
Protein, ur: NEGATIVE mg/dL
Specific Gravity, Urine: 1.02 (ref 1.005–1.030)
pH: 5.5 (ref 5.0–8.0)

## 2023-06-22 LAB — BASIC METABOLIC PANEL
Anion gap: 10 (ref 5–15)
BUN: 12 mg/dL (ref 6–20)
CO2: 20 mmol/L — ABNORMAL LOW (ref 22–32)
Calcium: 9.3 mg/dL (ref 8.9–10.3)
Chloride: 107 mmol/L (ref 98–111)
Creatinine, Ser: 0.83 mg/dL (ref 0.44–1.00)
GFR, Estimated: >60 mL/min (ref >60)
Glucose, Bld: 120 mg/dL — ABNORMAL HIGH (ref 70–99)
Potassium: 3.6 mmol/L (ref 3.5–5.1)
Sodium: 137 mmol/L (ref 135–145)

## 2023-06-22 LAB — CBC
HCT: 40.2 % (ref 36.0–46.0)
Hemoglobin: 13.5 g/dL (ref 12.0–15.0)
MCH: 27.4 pg (ref 26.0–34.0)
MCHC: 33.6 g/dL (ref 30.0–36.0)
MCV: 81.5 fL (ref 80.0–100.0)
Platelets: 279 10*3/uL (ref 150–400)
RBC: 4.93 MIL/uL (ref 3.87–5.11)
RDW: 13.3 % (ref 11.5–15.5)
WBC: 7.9 10*3/uL (ref 4.0–10.5)
nRBC: 0 % (ref 0.0–0.2)

## 2023-06-22 LAB — RESP PANEL BY RT-PCR (RSV, FLU A&B, COVID)  RVPGX2
Influenza A by PCR: NEGATIVE
Influenza B by PCR: NEGATIVE
Resp Syncytial Virus by PCR: NEGATIVE
SARS Coronavirus 2 by RT PCR: POSITIVE — AB

## 2023-06-22 LAB — PREGNANCY, URINE: Preg Test, Ur: NEGATIVE

## 2023-06-22 MED ORDER — ONDANSETRON HCL 4 MG/2ML IJ SOLN
INTRAMUSCULAR | Status: AC
  Filled 2023-06-22: qty 2

## 2023-06-22 MED ORDER — PAXLOVID (300/100) 20 X 150 MG & 10 X 100MG PO TBPK: 3.0000 | ORAL_TABLET | Freq: Two times a day (BID) | ORAL | 0 refills | Status: DC

## 2023-06-22 MED ORDER — PREDNISONE 20 MG PO TABS: ORAL_TABLET | ORAL | 0 refills | Status: AC

## 2023-06-22 MED ORDER — PAXLOVID (300/100) 20 X 150 MG & 10 X 100MG PO TBPK: 3.0000 | ORAL_TABLET | Freq: Two times a day (BID) | ORAL | 0 refills | Status: AC

## 2023-06-22 MED ORDER — ONDANSETRON 4 MG PO TBDP
4.0000 mg | ORAL_TABLET | Freq: Once | ORAL | Status: DC
  Filled 2023-06-22: qty 1

## 2023-06-22 MED ORDER — LACTATED RINGERS IV BOLUS
1000.0000 mL | Freq: Once | INTRAVENOUS | Status: AC
  Administered 2023-06-22: 1000 mL via INTRAVENOUS

## 2023-06-22 MED ORDER — ONDANSETRON HCL 4 MG/2ML IJ SOLN
4.0000 mg | Freq: Once | INTRAMUSCULAR | Status: AC
  Administered 2023-06-22: 4 mg via INTRAVENOUS

## 2023-06-22 MED ORDER — ACETAMINOPHEN 325 MG PO TABS
650.0000 mg | ORAL_TABLET | Freq: Once | ORAL | Status: AC
  Administered 2023-06-22: 650 mg via ORAL
  Filled 2023-06-22: qty 2

## 2023-06-22 MED ORDER — SUCRALFATE 1 G PO TABS: 1.0000 g | ORAL_TABLET | Freq: Three times a day (TID) | ORAL | 0 refills | Status: AC

## 2023-06-22 NOTE — ED Triage Notes (Signed)
 The patient finished antibiotics recently for a for a resp infection. She stated she has been tachycardic for 3 days. She took her metoprolol today at 8:30 am. She has dizziness and weakness.

## 2023-06-22 NOTE — Telephone Encounter (Signed)
 Patient called and reported that she had recently been diagnosed with COVID at the ED. She told me Paxlovid  was not sent in (although it looks like it was). I went ahead and sent it into a different pharmacy. Confirmed pharmacy with the patient.   Marty Shaggy, MD [facility] 

## 2023-06-22 NOTE — ED Provider Notes (Signed)
  EMERGENCY DEPARTMENT AT [facility] Provider Note   CSN: [id number] Arrival date & time: 06/22/23  1011     History  Chief Complaint  Patient presents with   Tachycardia   Dizziness    Nicole Mendoza is a 33 y.o. female with a history of immunodeficiency (missing antibodies), fibromyalgia, recurrent URI, OSA, and autonomic dysfunction who presents for tachycardia and dizziness. Patient is accompanied by her spouse.   Patient has been feeling sick since 12/25, states she began to experience symptoms of a sinus infection. She was given a course of cefdinir  and prednisone  by her allergist with minimal improvement in symptoms. She was seen on 06/19/23 for the same issue by her PCP and given an extended course of antibiotic. Has felt minimal improvement. Was supposed to have imaging of her sinus done this past week but was never called. She is currently endorsing facial and ear pressure, cough, fatigue, and nausea. Denies any ear pain, sore throat, or fever. Cough was productive with some greenish sputum, has improved and has been less productive. Did have some episodes of loose stools last week that have since resolved. Dizziness worsens with standing, has had decreased PO intake in the past week or so. Feels like she may be dehydrated. Prior to her illness, patient states one of her children was sick with a cold.   Patient has known tachycardia which often worsens during periods of illness. She states it can be up to 120-130 bmp even with her metoprolol  100 mg daily. She has been assessed for autonomic dysfunction in the past. TSH was noted to be elevated at PCP's office but patient states it was not elevated enough to start medication. Other daily medications include Protonix  40 mg BID, a probiotic, Zyrtec, Singulair , and Flonase . She also gets IV IG monthly, last given 06/06/23.    Dizziness     Home Medications Prior to Admission medications   Medication Sig Start Date  End Date Taking? Authorizing Provider  cefdinir  (OMNICEF ) 300 MG capsule Take 1 capsule (300 mg total) by mouth 2 (two) times daily for 10 days. 06/19/23 06/29/23 Yes Berneta Elsie Sayre, MD  cetirizine (ZYRTEC) 10 MG tablet Take 10 mg by mouth daily.   Yes [provider]  fluticasone  (FLONASE ) 50 MCG/ACT nasal spray Place 2 sprays into both nostrils daily. 09/27/22  Yes Marinda Rocky SAILOR, MD  levalbuterol  (XOPENEX  HFA) 45 MCG/ACT inhaler Inhale 2 puffs into the lungs every 6 (six) hours as needed. 06/20/23  Yes Iva Marty Saltness, MD  metoprolol  succinate (TOPROL -XL) 100 MG 24 hr tablet TAKE 1 TABLET BY MOUTH EVERY DAY WITH OR IMMEDIATELY FOLLOWING A MEAL 10/01/22  Yes Thedora Garnette HERO, MD  mometasone  (ASMANEX , 120 METERED DOSES,) 220 MCG/ACT inhaler Inhale 2 puffs into the lungs 2 (two) times daily. 04/24/23  Yes Iva Marty Saltness, MD  montelukast  (SINGULAIR ) 10 MG tablet TAKE 1 TABLET BY MOUTH EVERY DAY 10/14/22  Yes Marinda Rocky SAILOR, MD  nirmatrelvir /ritonavir  (PAXLOVID , 300/100,) 20 x 150 MG & 10 x 100MG  TBPK Take 3 tablets by mouth 2 (two) times daily for 5 days. Patient GFR is > 60. Take nirmatrelvir  (150 mg) two tablets twice daily for 5 days and ritonavir  (100 mg) one tablet twice daily for 5 days. 06/22/23 06/27/23 Yes Arellano Zameza, Sharyl Panchal, MD  ondansetron  (ZOFRAN ) 4 MG tablet Take 1 tablet (4 mg total) by mouth every 8 (eight) hours as needed for nausea or vomiting. 05/20/22  Yes Marinda Rocky SAILOR, MD  pantoprazole  (PROTONIX ) 40 MG tablet Take 1 tablet (40 mg total) by mouth 2 (two) times daily. 03/12/23 06/22/23 Yes Iva Marty Saltness, MD  predniSONE  (DELTASONE ) 10 MG tablet Take one tablet twice daily for seven days. 06/07/23  Yes Iva Marty Saltness, MD  predniSONE  (DELTASONE ) 20 MG tablet Take 2 tablets (40 mg total) by mouth daily with breakfast for 1 day, THEN 1 tablet (20 mg total) daily with breakfast for 4 days. 06/22/23 06/27/23 Yes Arellano Zameza, Stephanye Finnicum, MD  Probiotic  Product (PHILLIPS COLON HEALTH) CAPS  08/16/19  Yes [provider]  Ascorbic Acid (SUPER C COMPLEX PO)  07/19/19   [provider]  budesonide  (PULMICORT ) 0.5 MG/2ML nebulizer solution Take 2 mLs (0.5 mg total) by nebulization as needed. 05/20/22   Marinda Rocky SAILOR, MD  Cholecalciferol  100 MCG (4000 UT) CAPS Take 1 capsule (4,000 Units total) by mouth daily. 02/06/22   Whitmire, Dawn, FNP  EPINEPHrine  0.3 mg/0.3 mL IJ SOAJ injection  10/07/22   [provider]  famotidine  (PEPCID ) 40 MG tablet Take 1 tablet (40 mg total) by mouth daily. 07/02/22 12/10/22  Iva Marty Saltness, MD  ferrous sulfate 325 (65 FE) MG tablet 3 (three) times a week.  08/16/19   [provider]  hydrOXYzine  (ATARAX ) 25 MG tablet Take 1 tablet (25 mg total) by mouth every 6 (six) hours as needed for anxiety (allergies). 05/30/22   Marinda Rocky SAILOR, MD  ipratropium (ATROVENT ) 0.02 % nebulizer solution Take 1.25 mLs (0.25 mg total) by nebulization 4 (four) times daily. Mix 0.5 vial in with levalbuterol  nebulizer and can use every 6 hours as needed for shortness of breath and wheezing. Patient taking differently: Take 0.25 mg by nebulization as needed. Mix 0.5 vial in with levalbuterol  nebulizer and can use every 6 hours as needed for shortness of breath and wheezing. 05/27/22   Marinda Rocky SAILOR, MD  levalbuterol  (XOPENEX ) 1.25 MG/3ML nebulizer solution USE 1 VIAL BY NEBULIZATION EVERY 6 (SIX) HOURS AS NEEDED FOR WHEEZING. 05/29/22   Marinda Rocky SAILOR, MD  Levonorgestrel-Ethinyl Estradiol  (AMETHIA) 0.1-0.02 & 0.01 MG tablet Take 1 tablet by mouth daily.    [provider]  metoprolol  tartrate (LOPRESSOR ) 50 MG tablet TAKE 1 TABLET (50 MG TOTAL) BY MOUTH AS NEEDED (FOR PALPITATIONS). 04/21/23   Fernande Elspeth BROCKS, MD  Prenatal Vit-Fe Fumarate-FA (MULTIVITAMIN-PRENATAL) 27-0.8 MG TABS tablet Take 1 tablet by mouth daily at 12 noon.    [provider]  sucralfate  (CARAFATE ) 1 g tablet Take 1 tablet (1 g  total) by mouth 4 (four) times daily -  with meals and at bedtime. 06/22/23 07/22/23  Arellano Zameza, Tracie Dore, MD      Allergies    Budesonide -formoterol  fumarate, Other, Penicillins, Raspberry, Spiriva  respimat [tiotropium bromide monohydrate ], Albuterol, and Losartan    Review of Systems   Review of Systems  Neurological:  Positive for dizziness.    Physical Exam Updated Vital Signs BP 134/73   Pulse 85   Temp 97.8 F (36.6 C) (Oral)   Resp 16   Ht 5' 8 (1.727 m)   Wt 133.8 kg   LMP 06/05/2023 (Exact Date)   SpO2 100%   BMI 44.85 kg/m  Physical Exam HENT:     Head: Normocephalic and atraumatic.     Comments: Facial pressure present.    Right Ear: Tympanic membrane, ear canal and external ear normal.     Left Ear: Tympanic membrane, ear canal and external ear normal.     Nose: Nose  normal.     Mouth/Throat:     Mouth: Mucous membranes are dry.  Eyes:     Extraocular Movements: Extraocular movements intact.     Pupils: Pupils are equal, round, and reactive to light.  Cardiovascular:     Rate and Rhythm: Regular rhythm. Tachycardia present.     Heart sounds: Normal heart sounds.  Pulmonary:     Effort: Pulmonary effort is normal.     Breath sounds: Normal breath sounds.  Abdominal:     General: Abdomen is flat.     Palpations: Abdomen is soft.  Musculoskeletal:     Cervical back: Normal range of motion and neck supple.  Skin:    General: Skin is warm and dry.  Neurological:     General: No focal deficit present.     Mental Status: She is alert and oriented to person, place, and time.  Psychiatric:        Mood and Affect: Mood normal.        Behavior: Behavior normal.     ED Results / Procedures / Treatments   Labs (all labs ordered are listed, but only abnormal results are displayed) Labs Reviewed  RESP PANEL BY RT-PCR (RSV, FLU A&B, COVID)  [id number] - Abnormal; Notable for the following components:      Result Value   SARS Coronavirus 2 by RT PCR POSITIVE  (*)    All other components within normal limits  BASIC METABOLIC PANEL - Abnormal; Notable for the following components:   CO2 20 (*)    Glucose, Bld 120 (*)    All other components within normal limits  CBC  URINALYSIS, ROUTINE W REFLEX MICROSCOPIC  PREGNANCY, URINE  CBG MONITORING, ED    EKG EKG Interpretation Date/Time:  Sunday June 22 2023 10:21:57 EST Ventricular Rate:  123 PR Interval:  122 QRS Duration:  91 QT Interval:  323 QTC Calculation: 462 R Axis:   77  Text Interpretation: Sinus tachycardia Abnormal T, consider ischemia, diffuse leads Confirmed by Lenor Hollering [id number]) on 06/22/2023 11:18:50 AM  Radiology CT Maxillofacial Wo Contrast Result Date: 06/22/2023 CLINICAL DATA:  Abnormal olfaction (CN 1) Rule out complication from sinus infection EXAM: CT MAXILLOFACIAL WITHOUT CONTRAST TECHNIQUE: Multidetector CT imaging of the maxillofacial structures was performed. Multiplanar CT image reconstructions were also generated. RADIATION DOSE REDUCTION: This exam was performed according to the departmental dose-optimization program which includes automated exposure control, adjustment of the mA and/or kV according to patient size and/or use of iterative reconstruction technique. COMPARISON:  None Available. FINDINGS: Osseous: No fracture or mandibular dislocation. No destructive process. Orbits: Negative. No traumatic or inflammatory finding. Sinuses: No middle ear or mastoid effusion. Paranasal sinuses are notable for mild mucosal thickening in the floor of the bilateral maxillary sinuses in the anterior ethmoid air cells bilaterally. Bilateral OMC is are occluded. Bilateral frontal recesses are occluded. And bilateral sphenoethmoidal recesses are occluded. Bilateral cribriform plates and olfactory grooves have a normal CT appearance. Soft tissues: Negative. Limited intracranial: No significant or unexpected finding. IMPRESSION: 1. Mild mucosal thickening in the floor of the  bilateral maxillary sinuses and anterior ethmoid air cells bilaterally. Occlusion of the bilateral OMCs, frontal recesses, and sphenoethmoidal recesses. 2. Bilateral cribriform plates and olfactory grooves have a normal CT appearance. Electronically Signed   By: Lyndall Gore M.D.   On: 06/22/2023 12:21    Procedures Procedures    Medications Ordered in ED Medications  lactated ringers  bolus 1,000 mL (0 mLs Intravenous Stopped 06/22/23 1248)  acetaminophen  (TYLENOL ) tablet 650 mg (650 mg Oral Given 06/22/23 1122)  ondansetron  (ZOFRAN ) injection 4 mg (4 mg Intravenous Given 06/22/23 1128)  ondansetron  (ZOFRAN ) 4 MG/2ML injection (  Given 06/22/23 1131)    ED Course/ Medical Decision Making/ A&P                                 Medical Decision Making Patient presents with symptoms concerning for viral vs bacterial sinusitis and dehydration. Her allergist treated patient for sinus infection with oral antibiotic and a steroid course on 12/21. Patient's symptoms have had little improvement and her PCP extended her antibiotic treatment on 06/18/22. Patient stopped taking the steroids prior to completing the full course. Patient states she tested negative for COVID at home on Friday (06/19/22).   Ordered basic labs, respiratory panel, IV fluids, and CT maxillofacial to help with further medical decision making. HR improved from 120s to 80s-90s at time of discharge with overall improvement in dizziness following IV fluids. CBC unremarkable, serum Cr 0.83, GFR > 60. Patient's CT maxillofacial imaging consistent with sinus infection, no signs of abscess or soft tissue infection. Respiratory panel positive for COVID. Discussed imaging and lab findings with patient. At this time patient is stable and does not need admission to the hospital. Agreeable to treatment with Paxlovid , a short course of prednisone  (40 mg x 1 day, 20 mg x 4 days), and continuing her Cefdinir  provided by her PCP. Patient has a scheduled PCP  appointment tomorrow morning. Discussed indications to return to ED or call PCP and patient expressed understanding.   Amount and/or Complexity of Data Reviewed Labs: ordered. Radiology: ordered.  Risk OTC drugs. Prescription drug management.          Final Clinical Impression(s) / ED Diagnoses Final diagnoses:  Dehydration  Dizziness  Sinusitis, unspecified chronicity, unspecified location    Rx / DC Orders ED Discharge Orders          Ordered    predniSONE  (DELTASONE ) 20 MG tablet  Q breakfast        06/22/23 1316    nirmatrelvir /ritonavir  (PAXLOVID , 300/100,) 20 x 150 MG & 10 x 100MG  TBPK  2 times daily        06/22/23 1330    sucralfate  (CARAFATE ) 1 g tablet  3 times daily with meals & bedtime        06/22/23 1332           Felisa Zechman Arellano Zameza, MD  Internal Medicine Teaching Service, PGY-1    Carlotta Consepcion Felix, MD 06/22/23 1443    Lenor Hollering, MD 06/22/23 1445

## 2023-06-22 NOTE — Discharge Instructions (Addendum)
 You were seen for an upper respiratory infection. You were found to be positive for COVID and imaging results were consistent with your symptoms of a sinus infection. The imaging was not concerning for an abscess or soft tissue infection. We discussed the findings and together agreed for you to continue the course of Cefdinir  provided by your PCP. You were also prescribed Paxlovid  for COVID and a 5 day course of Prednisone  (40 mg the first day, followed by 20 mg for 4 days). These medications have been sent to your preferred pharmacy. It is very important that you go to your PCP appointment scheduled for tomorrow for further follow up. Please follow common precautions for a COVID infection, including use of a face mask and avoiding close contact with individuals at higher risk of infection (e.g., infants, the elderly, etc.).   Please call your PCP if your symptoms worsen. Please return to the ED if you have a new fever, sharp headache, or can't keep any food or drink by mouth.

## 2023-06-23 ENCOUNTER — Ambulatory Visit: Payer: Self-pay | Admitting: Family Medicine

## 2023-06-24 DIAGNOSIS — D806 Antibody deficiency with near-normal immunoglobulins or with hyperimmunoglobulinemia: Secondary | ICD-10-CM | POA: Diagnosis not present

## 2023-06-25 ENCOUNTER — Ambulatory Visit: Payer: Self-pay | Admitting: Family Medicine

## 2023-06-25 ENCOUNTER — Telehealth: Payer: Self-pay

## 2023-06-25 ENCOUNTER — Encounter: Payer: Self-pay | Admitting: Family Medicine

## 2023-06-25 VITALS — BP 154/80 | HR 95 | Temp 98.4°F | Ht 68.0 in | Wt 300.2 lb

## 2023-06-25 DIAGNOSIS — U071 COVID-19: Secondary | ICD-10-CM | POA: Insufficient documentation

## 2023-06-25 DIAGNOSIS — J0100 Acute maxillary sinusitis, unspecified: Secondary | ICD-10-CM

## 2023-06-25 NOTE — Assessment & Plan Note (Signed)
 Reviewed home care instructions for COVID, including rest, pushing fluids, and OTC medications as needed for symptom relief. Continue Paxlovid . Continue to wear a mask around others for an additional 5 days. If symptoms, esp, dyspnea develops/worsens, recommend in-person evaluation at either an urgent care or the emergency room.

## 2023-06-25 NOTE — Progress Notes (Signed)
 [facility] [address] [place] [address] Dept: [phone] Dept Fax: [phone]  Office Visit  Subjective:    Patient ID: Nicole Mendoza, female    DOB: 12/18/1990, 33 y.o..   MRN: [id number]  Chief Complaint  Patient presents with   Sinus Problem    C/o having been sick since 06/09/23, and was tested for Covid on 06/22/23.    Needs STD and FMLA form filled out.     History of Present Illness:  Patient is in today for reassessment of several recent illnesses. Nicole Mendoza initially became sick around 06/04/2023. She was seen 12/19 and tested positive for strep throat. She was treated with a course of azithromycin . By 12/26, she was not showing improvement, so was seen by Kenney Roys. She was diagnosed with sinusitis, but no additional antibiotics were started. She saw Dr. Berneta on 1/2 and was started on cefdinir  300 mg bid. She was then seen in the ED on 1/5 and had a CT of her sinuses. These showed some sinus mucosal thickening and sinus occlusion without sing of sinusitis. However, she tested positive for COVID that day.  Nicole Mendoza is still feeling significant fatigue. She feels her breathing has improved. She was started on Paxlovid  and she feels this has helped. She has not completed her course of cefdinir  yet. She is scheduled for her Privagen infusion tomorrow. She anticipates that she could return to work next week.  Past Medical History: Patient Active Problem List   Diagnosis Date Noted   Acute non-recurrent maxillary sinusitis 06/19/2023   Elevated TSH 06/19/2023   NAFLD (nonalcoholic fatty liver disease) 93/89/7975   Other fatigue 11/25/2022   Prediabetes 10/15/2022   Eating disorder 10/15/2022   BMI 45.0-49.9, adult (HCC) Current BMI 45.2 10/15/2022   Adjustment disorder with anxiety 06/12/2022   Vocal cord dysfunction 06/03/2022   Positive ANA (antinuclear antibody) 07/13/2021   Fibromyalgia 07/13/2021    Dysfunction of left eustachian tube 06/26/2021   Vertigo 06/26/2021   Obstructive sleep apnea 02/26/2021   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   Recurrent infections 12/20/2020   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 11/22/2020   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   Recurrent sinusitis 11/09/2020   Pre-nodular edema of the vocal folds 11/09/2020   ASCUS of cervix with negative high risk HPV 07/10/2020   Cardiac murmur 09/02/2019   Tachycardia 09/01/2019   Salivary stone 09/01/2019   Anaphylactic shock due to adverse food reaction 01/22/2019   Seasonal and perennial allergic rhinitis 01/22/2019   Morbid obesity (HCC) 10/28/2017   Moderate persistent asthma without complication 07/29/2017   Essential hypertension 04/22/2017   Dysmenorrhea 06/26/2016   Menorrhagia with regular cycle 06/26/2016   Gastroesophageal reflux disease 12/22/2015   Vitamin D  deficiency 11/02/2014   Past Surgical History:  Procedure Laterality Date   NO PAST SURGERIES     Family History  Adopted: Yes  Problem Relation Age of Onset   Asthma Mother    Diabetes Mother    High blood pressure Mother    Heart disease Mother    Bipolar disorder Mother    Sleep apnea Mother    Obesity Mother    Allergic rhinitis Father    Asthma Father    Diabetes Father    High blood pressure Father    Hyperlipidemia Father    Obesity Father  Allergic rhinitis Sister    Asthma Sister    Angioedema Neg Hx    Atopy Neg Hx    Eczema Neg Hx    Immunodeficiency Neg Hx    Urticaria Neg Hx    Outpatient Medications Prior to Visit  Medication Sig Dispense Refill   Ascorbic Acid (SUPER C COMPLEX PO)      budesonide  (PULMICORT ) 0.5 MG/2ML nebulizer solution Take 2 mLs (0.5 mg total) by nebulization as needed. 60 mL 2   cefdinir  (OMNICEF ) 300 MG capsule Take 1 capsule (300 mg total) by  mouth 2 (two) times daily for 10 days. 20 capsule 0   cetirizine (ZYRTEC) 10 MG tablet Take 10 mg by mouth daily.     Cholecalciferol  100 MCG (4000 UT) CAPS Take 1 capsule (4,000 Units total) by mouth daily. 30 capsule 0   EPINEPHrine  0.3 mg/0.3 mL IJ SOAJ injection      famotidine  (PEPCID ) 40 MG tablet Take 1 tablet (40 mg total) by mouth daily. 90 tablet 2   ferrous sulfate 325 (65 FE) MG tablet 3 (three) times a week.      fluticasone  (FLONASE ) 50 MCG/ACT nasal spray Place 2 sprays into both nostrils daily. 16 g 5   hydrOXYzine  (ATARAX ) 25 MG tablet Take 1 tablet (25 mg total) by mouth every 6 (six) hours as needed for anxiety (allergies). 30 tablet 1   ipratropium (ATROVENT ) 0.02 % nebulizer solution Take 1.25 mLs (0.25 mg total) by nebulization 4 (four) times daily. Mix 0.5 vial in with levalbuterol  nebulizer and can use every 6 hours as needed for shortness of breath and wheezing. (Patient taking differently: Take 0.25 mg by nebulization as needed. Mix 0.5 vial in with levalbuterol  nebulizer and can use every 6 hours as needed for shortness of breath and wheezing.) 75 mL 3   levalbuterol  (XOPENEX  HFA) 45 MCG/ACT inhaler Inhale 2 puffs into the lungs every 6 (six) hours as needed. 15 g 0   levalbuterol  (XOPENEX ) 1.25 MG/3ML nebulizer solution USE 1 VIAL BY NEBULIZATION EVERY 6 (SIX) HOURS AS NEEDED FOR WHEEZING. 540 mL 1   Levonorgestrel-Ethinyl Estradiol  (AMETHIA) 0.1-0.02 & 0.01 MG tablet Take 1 tablet by mouth daily.     metoprolol  succinate (TOPROL -XL) 100 MG 24 hr tablet TAKE 1 TABLET BY MOUTH EVERY DAY WITH OR IMMEDIATELY FOLLOWING A MEAL 90 tablet 3   metoprolol  tartrate (LOPRESSOR ) 50 MG tablet TAKE 1 TABLET (50 MG TOTAL) BY MOUTH AS NEEDED (FOR PALPITATIONS). 90 tablet 0   mometasone  (ASMANEX , 120 METERED DOSES,) 220 MCG/ACT inhaler Inhale 2 puffs into the lungs 2 (two) times daily. 1 each 5   montelukast  (SINGULAIR ) 10 MG tablet TAKE 1 TABLET BY MOUTH EVERY DAY 90 tablet 1    nirmatrelvir /ritonavir  (PAXLOVID , 300/100,) 20 x 150 MG & 10 x 100MG  TBPK Take 3 tablets by mouth 2 (two) times daily for 5 days. Patient GFR is normal. Take nirmatrelvir  (150 mg) two tablets twice daily for 5 days and ritonavir  (100 mg) one tablet twice daily for 5 days. 30 tablet 0   ondansetron  (ZOFRAN ) 4 MG tablet Take 1 tablet (4 mg total) by mouth every 8 (eight) hours as needed for nausea or vomiting. 20 tablet 0   pantoprazole  (PROTONIX ) 40 MG tablet Take 1 tablet (40 mg total) by mouth 2 (two) times daily. 60 tablet 3   Prenatal Vit-Fe Fumarate-FA (MULTIVITAMIN-PRENATAL) 27-0.8 MG TABS tablet Take 1 tablet by mouth daily at 12 noon.     Probiotic Product (PHILLIPS COLON HEALTH)  CAPS      sucralfate  (CARAFATE ) 1 g tablet Take 1 tablet (1 g total) by mouth 4 (four) times daily -  with meals and at bedtime. 120 tablet 0   predniSONE  (DELTASONE ) 10 MG tablet Take one tablet twice daily for seven days. (Patient not taking: Reported on 06/25/2023) 14 tablet 0   predniSONE  (DELTASONE ) 20 MG tablet Take 2 tablets (40 mg total) by mouth daily with breakfast for 1 day, THEN 1 tablet (20 mg total) daily with breakfast for 4 days. (Patient not taking: Reported on 06/25/2023) 6 tablet 0   No facility-administered medications prior to visit.   Allergies  Allergen Reactions   Budesonide -Formoterol  Fumarate Other (See Comments)    Causes Hypertension   Other     Tree nuts   Penicillins Hives   Raspberry Hives   Spiriva  Respimat [Tiotropium Bromide Monohydrate ] Other (See Comments)    Pt states medication causes dizziness   Albuterol Palpitations   Losartan Rash     Objective:   Today's Vitals   06/25/23 1559  BP: (!) 154/80  Pulse: 95  Temp: 98.4 F (36.9 C)  TempSrc: Temporal  SpO2: 99%  Weight: (!) 300 lb 3.2 oz (136.2 kg)  Height: 5' 8 (1.727 m)   Body mass index is 45.65 kg/m.   General: Well developed, well nourished. No acute distress. Lungs: Clear to auscultation bilaterally. No  wheezing, rales or rhonchi. Psych: Alert and oriented. Normal mood and affect.  Health Maintenance Due  Topic Date Due   HIV Screening  Never done   Hepatitis C Screening  Never done   Lab Results    Latest Ref Rng & Units 06/22/2023   10:58 AM 06/17/2023   12:35 PM 11/25/2022    8:55 AM  CBC  WBC 4.0 - 10.5 K/uL 7.9  13.1  10.7   Hemoglobin 12.0 - 15.0 g/dL 86.4  85.9  87.4   Hematocrit 36.0 - 46.0 % 40.2  42.4  38.7   Platelets 150 - 400 K/uL 279  329  205        Latest Ref Rng & Units 06/22/2023   10:58 AM 06/17/2023   12:35 PM 11/25/2022    8:55 AM  CMP  Glucose 70 - 99 mg/dL 879  89  75   BUN 6 - 20 mg/dL 12  14  20    Creatinine 0.44 - 1.00 mg/dL 9.16  9.28  9.25   Sodium 135 - 145 mmol/L 137  139  138   Potassium 3.5 - 5.1 mmol/L 3.6  4.2  4.0   Chloride 98 - 111 mmol/L 107  102  100   CO2 22 - 32 mmol/L 20  19  23    Calcium 8.9 - 10.3 mg/dL 9.3  9.3  9.4   Total Protein 6.0 - 8.5 g/dL  7.6  7.6   Total Bilirubin 0.0 - 1.2 mg/dL  0.4  0.5   Alkaline Phos 44 - 121 IU/L  95  90   AST 0 - 40 IU/L  31  17   ALT 0 - 32 IU/L  40  18    Component Ref Range & Units (hover) 3 d ago 1 yr ago  SARS Coronavirus 2 by RT PCR POSITIVE Abnormal  NEGATIVE CM   Imaging: CT Maxilofacial wo contrast (06/22/2023) IMPRESSION: 1. Mild mucosal thickening in the floor of the bilateral maxillary sinuses and anterior ethmoid air cells bilaterally. Occlusion of the bilateral OMCs, frontal recesses, and sphenoethmoidal recesses. 2.  Bilateral cribriform plates and olfactory grooves have a normal CT appearance.      Assessment & Plan:   Problem List Items Addressed This Visit       Respiratory   Acute non-recurrent maxillary sinusitis   The CT scan did not confirm sinusitis. However, I recommend she complete her course of antibiotics at this point.        Other   COVID-19 - Primary   Reviewed home care instructions for COVID, including rest, pushing fluids, and OTC medications as  needed for symptom relief. Continue Paxlovid . Continue to wear a mask around others for an additional 5 days. If symptoms, esp, dyspnea develops/worsens, recommend in-person evaluation at either an urgent care or the emergency room.         Return in about 4 weeks (around 07/23/2023) for Reassessment.   Garnette CHRISTELLA Simpler, MD

## 2023-06-25 NOTE — Telephone Encounter (Signed)
 FMLA forms have been filled out and signed by Dr.Gallagher. Copies have been made and placed into bulk scanning, originals have been placed to be mailed out to the address on file.

## 2023-06-25 NOTE — Assessment & Plan Note (Signed)
 The CT scan did not confirm sinusitis. However, I recommend she complete her course of antibiotics at this point.

## 2023-06-30 DIAGNOSIS — Z0000 Encounter for general adult medical examination without abnormal findings: Secondary | ICD-10-CM | POA: Diagnosis not present

## 2023-06-30 DIAGNOSIS — K2100 Gastro-esophageal reflux disease with esophagitis, without bleeding: Secondary | ICD-10-CM | POA: Diagnosis not present

## 2023-06-30 DIAGNOSIS — R7303 Prediabetes: Secondary | ICD-10-CM | POA: Diagnosis not present

## 2023-06-30 DIAGNOSIS — E782 Mixed hyperlipidemia: Secondary | ICD-10-CM | POA: Diagnosis not present

## 2023-07-02 DIAGNOSIS — D806 Antibody deficiency with near-normal immunoglobulins or with hyperimmunoglobulinemia: Secondary | ICD-10-CM | POA: Diagnosis not present

## 2023-07-10 ENCOUNTER — Telehealth: Payer: Self-pay | Admitting: Allergy & Immunology

## 2023-07-10 NOTE — Telephone Encounter (Signed)
Patient contacted me to let me know that her sample had been discarded at the lab due to inadequate identifiers. She is coming in tomorrow to provide another sample.   I re-printed the requisition. It should be on the printer in the back nursing station.   Joni Reining - can you fax her last five clinic notes to the following number? [employer] needs it for insurance approval.   Fax #: [phone]

## 2023-07-10 NOTE — Telephone Encounter (Signed)
Patient called stating she had genetic testing done on 12/29 and her sample was sent to [place]. Patient states the place that has her blood told her that her name and DOB was labeled wrong. Patient is wanting to know if she can come in and get another blood draw.

## 2023-07-11 ENCOUNTER — Encounter: Payer: Self-pay | Admitting: Student

## 2023-07-11 ENCOUNTER — Ambulatory Visit: Payer: BC Managed Care – PPO | Attending: Student | Admitting: Student

## 2023-07-11 VITALS — BP 138/76 | HR 109 | Ht 68.0 in | Wt 298.2 lb

## 2023-07-11 DIAGNOSIS — R000 Tachycardia, unspecified: Secondary | ICD-10-CM

## 2023-07-11 DIAGNOSIS — G4733 Obstructive sleep apnea (adult) (pediatric): Secondary | ICD-10-CM | POA: Diagnosis not present

## 2023-07-11 DIAGNOSIS — R002 Palpitations: Secondary | ICD-10-CM | POA: Diagnosis not present

## 2023-07-11 DIAGNOSIS — I10 Essential (primary) hypertension: Secondary | ICD-10-CM

## 2023-07-11 NOTE — Telephone Encounter (Signed)
Please see other contact from Dr Dellis Anes

## 2023-07-11 NOTE — Progress Notes (Signed)
  Electrophysiology Office Note:   Date:  07/11/2023  ID:  Nicole Mendoza, DOB Jun 07, 1991, MRN [id number]  Primary Cardiologist: None Electrophysiologist: Sherryl Manges, MD      History of Present Illness:   Nicole Mendoza is a 33 y.o. female with h/o HTN, Tachy-palpitations, and obesity seen today for routine electrophysiology followup.   Since last being seen in our clinic the patient reports doing OK until last month. She had viral illness, strep, and ultimately COVID. She continues to have exacerbation of her baseline tachy-palpitations. Encouragement given, as to be expected. Otherwise, denies chest pain or syncope. Very little exercise tolerance, but not very active. Wants to work up to it. Has recumbant bike she plans to use and considering bariatric surgery.   Review of systems complete and found to be negative unless listed in HPI.   EP Information / Studies Reviewed:    EKG is ordered today. Personal review as below.  EKG Interpretation Date/Time:  Friday July 11 2023 11:07:25 EST Ventricular Rate:  109 PR Interval:  134 QRS Duration:  86 QT Interval:  318 QTC Calculation: 428 R Axis:   61  Text Interpretation: Sinus tachycardia T wave abnormality, consider inferior ischemia Confirmed by Maxine Glenn [phone]) on 07/11/2023 11:12:50 AM    Arrhythmia/Device History No specialty comments available.   Physical Exam:   VS:  BP 138/76   Pulse (!) 109   Ht 5\' 8"  (1.727 m)   Wt 298 lb 3.2 oz (135.3 kg)   LMP 06/05/2023 (Exact Date)   SpO2 99%   BMI 45.34 kg/m    Wt Readings from Last 3 Encounters:  07/11/23 298 lb 3.2 oz (135.3 kg)  06/25/23 (!) 300 lb 3.2 oz (136.2 kg)  06/22/23 295 lb (133.8 kg)     GEN: No acute distress NECK: No JVD; No carotid bruits CARDIAC: Regular rate and rhythm, no murmurs, rubs, gallops RESPIRATORY:  Clear to auscultation without rales, wheezing or rhonchi  ABDOMEN: Soft, non-tender, non-distended EXTREMITIES:  No edema; No deformity    ASSESSMENT AND PLAN:    Exertional Tachypalpitations HTN We discussed the role of salt and water repletion, the importance of exercise, often needing to be started in the recumbent position, and the awareness of triggers and the role of ambient heat and dehydration.  Compression wear discusses as best applied ABOVE the knees. (Thigh and abdominal)  Normal to have exacerbations after viral illness, especially COVID. Diagnosed on 1/5 so could still expect another month even prior to symptoms calming down.   Did not desire to increase toprol at this time.   Obesity Body mass index is 45.34 kg/m.  Encouraged lifestyle modification    Follow up with Dr. Graciela Husbands in 12 months  Signed, Graciella Freer, PA-C

## 2023-07-11 NOTE — Patient Instructions (Signed)
Medication Instructions:  Your physician recommends that you continue on your current medications as directed. Please refer to the Current Medication list given to you today.  *If you need a refill on your cardiac medications before your next appointment, please call your pharmacy*  Lab Work: None ordered If you have labs (blood work) drawn today and your tests are completely normal, you will receive your results only by: MyChart Message (if you have MyChart) OR A paper copy in the mail If you have any lab test that is abnormal or we need to change your treatment, we will call you to review the results.  Follow-Up: At [facility], you and your health needs are our priority.  As part of our continuing mission to provide you with exceptional heart care, we have created designated Provider Care Teams.  These Care Teams include your primary Cardiologist (physician) and Advanced Practice Providers (APPs -  Physician Assistants and Nurse Practitioners) who all work together to provide you with the care you need, when you need it.  Your next appointment:   1 year(s)  Provider:   Casimiro Needle "Otilio Saber, PA-C

## 2023-07-11 NOTE — Telephone Encounter (Signed)
Patient came in this morning, 07/11/23 - blood work ha been Counselling psychologist.  Per Joni Reining:  Medical records (progress notes) have been faxed   Forwarding updated message to provider.

## 2023-07-14 ENCOUNTER — Other Ambulatory Visit: Payer: Self-pay | Admitting: Internal Medicine

## 2023-07-17 DIAGNOSIS — Z91018 Allergy to other foods: Secondary | ICD-10-CM | POA: Diagnosis not present

## 2023-07-17 DIAGNOSIS — G90A Postural orthostatic tachycardia syndrome (POTS): Secondary | ICD-10-CM | POA: Diagnosis not present

## 2023-07-17 DIAGNOSIS — R5382 Chronic fatigue, unspecified: Secondary | ICD-10-CM | POA: Diagnosis not present

## 2023-07-17 DIAGNOSIS — D806 Antibody deficiency with near-normal immunoglobulins or with hyperimmunoglobulinemia: Secondary | ICD-10-CM | POA: Diagnosis not present

## 2023-07-17 NOTE — Progress Notes (Deleted)
 Marland Kitchen

## 2023-07-18 DIAGNOSIS — D806 Antibody deficiency with near-normal immunoglobulins or with hyperimmunoglobulinemia: Secondary | ICD-10-CM | POA: Diagnosis not present

## 2023-07-20 ENCOUNTER — Other Ambulatory Visit: Payer: Self-pay | Admitting: Allergy & Immunology

## 2023-07-21 ENCOUNTER — Telehealth: Payer: Self-pay | Admitting: Family Medicine

## 2023-07-21 ENCOUNTER — Ambulatory Visit: Payer: BC Managed Care – PPO | Admitting: Neurology

## 2023-07-21 NOTE — Telephone Encounter (Signed)
Pt cancelled appt due to child having a cold. Transferred to [place]

## 2023-07-23 ENCOUNTER — Ambulatory Visit: Payer: BC Managed Care – PPO | Admitting: Internal Medicine

## 2023-07-26 ENCOUNTER — Encounter: Payer: Self-pay | Admitting: Allergy & Immunology

## 2023-07-26 MED ORDER — DOXYCYCLINE HYCLATE 100 MG PO TABS: ORAL_TABLET | ORAL | 1 refills | Status: DC

## 2023-07-26 NOTE — Progress Notes (Signed)
 Patient continues to have productive cough despite five days of doxycycline  and low dose prednisone . COVID testing has been negative. She has a positive influenza exposure, but she declined tamiflu due to concern of experiencing an allergic reaction. I'm sending in some more doxycycline . I also want to get a high resolution chest CT to follow up on pulmonary nodules found in 2024.

## 2023-07-28 ENCOUNTER — Telehealth: Payer: Self-pay | Admitting: *Deleted

## 2023-07-28 ENCOUNTER — Ambulatory Visit: Payer: BC Managed Care – PPO | Admitting: Family Medicine

## 2023-07-28 DIAGNOSIS — D806 Antibody deficiency with near-normal immunoglobulins or with hyperimmunoglobulinemia: Secondary | ICD-10-CM | POA: Diagnosis not present

## 2023-07-28 NOTE — Telephone Encounter (Signed)
 Patient called and advised seen by Dr Lydia Sams and suggested increasing her Privigen  infusions to every 3 weeks. Please advise

## 2023-07-29 ENCOUNTER — Encounter: Payer: Self-pay | Admitting: Internal Medicine

## 2023-07-29 ENCOUNTER — Ambulatory Visit: Payer: BC Managed Care – PPO | Admitting: Internal Medicine

## 2023-07-29 ENCOUNTER — Telehealth: Payer: Self-pay

## 2023-07-29 ENCOUNTER — Other Ambulatory Visit: Payer: Self-pay

## 2023-07-29 VITALS — BP 120/82 | HR 97 | Temp 98.1°F | Ht 68.0 in | Wt 301.4 lb

## 2023-07-29 DIAGNOSIS — J101 Influenza due to other identified influenza virus with other respiratory manifestations: Secondary | ICD-10-CM

## 2023-07-29 LAB — POCT INFLUENZA A/B
Influenza A, POC: POSITIVE — AB
Influenza B, POC: NEGATIVE

## 2023-07-29 LAB — POCT RAPID STREP A (OFFICE): Rapid Strep A Screen: NEGATIVE

## 2023-07-29 LAB — POC COVID19 BINAXNOW: SARS Coronavirus 2 Ag: NEGATIVE

## 2023-07-29 MED ORDER — PREDNISONE 10 MG PO TABS: 10.0000 mg | ORAL_TABLET | Freq: Two times a day (BID) | ORAL | 0 refills | Status: AC

## 2023-07-29 MED ORDER — MOXIFLOXACIN HCL 400 MG PO TABS: 400.0000 mg | ORAL_TABLET | Freq: Every day | ORAL | 0 refills | Status: AC

## 2023-07-29 NOTE — Progress Notes (Unsigned)
[facility] [address] [place] [address] Dept: [phone] Dept Fax: [phone]  Acute Care Office Visit  Subjective:   Nicole Mendoza 1991-02-04 07/29/2023  Chief Complaint  Patient presents with   Sinus Problem   Sore Throat    Symptoms started  on Friday  Right ear pressure and pain    HPI: Discussed the use of AI scribe software for clinical note transcription with the patient, who gave verbal consent to proceed.  History of Present Illness   The patient, with a history of immune deficiency and asthma, presents with sinusitis and right ear pain. She has been receiving IVIG for her immune deficiency and had her most recent treatment yesterday. She initially fell ill with strep throat before Christmas, which was followed by a cold. She then contracted COVID-19 and took Paxlovid, which improved her symptoms within 24 hours. However, she has been experiencing uncontrolled postnasal drip since her COVID-19 infection.  Last week, one of her sons tested positive for flu A, and she began to experience a tickle in her chest and a mild cough on Friday. She contacted her immunologist, who prescribed doxycycline and prednisone. Despite this treatment, her symptoms have progressively worsened over the past 3 days. She has been using Flonase two to three times a day to manage her sinus symptoms, but she continues to experience significant nasal congestion and pressure. Her right ear has been causing her significant pain.  Her chest has been burning, and she has been coughing intermittently, sometimes producing sputum. She has noticed an improvement in the color of her sputum since starting doxycycline. She denies having a fever.       The following portions of the patient's history were reviewed and updated as appropriate: past medical history, past surgical history, family history, social history, allergies, medications, and  problem list.   Patient Active Problem List   Diagnosis Date Noted   COVID-19 06/25/2023   Acute non-recurrent maxillary sinusitis 06/19/2023   Elevated TSH 06/19/2023   NAFLD (nonalcoholic fatty liver disease) 32/44/0102   Other fatigue 11/25/2022   Prediabetes 10/15/2022   Eating disorder 10/15/2022   BMI 45.0-49.9, adult (HCC) Current BMI 45.2 10/15/2022   Adjustment disorder with anxiety 06/12/2022   Vocal cord dysfunction 06/03/2022   Positive ANA (antinuclear antibody) 07/13/2021   Fibromyalgia 07/13/2021   Dysfunction of left eustachian tube 06/26/2021   Vertigo 06/26/2021   Obstructive sleep apnea 02/26/2021   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   Recurrent infections 12/20/2020   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 11/22/2020   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   Recurrent sinusitis 11/09/2020   Pre-nodular edema of the vocal folds 11/09/2020   ASCUS of cervix with negative high risk HPV 07/10/2020   Cardiac murmur 09/02/2019   Tachycardia 09/01/2019   Salivary stone 09/01/2019   Anaphylactic shock due to adverse food reaction 01/22/2019   Seasonal and perennial allergic rhinitis 01/22/2019   Morbid obesity (HCC) 10/28/2017   Moderate persistent asthma without complication 07/29/2017   Essential hypertension 04/22/2017   Dysmenorrhea 06/26/2016   Menorrhagia with regular cycle 06/26/2016   Gastroesophageal reflux disease 12/22/2015   Vitamin D deficiency 11/02/2014   Past Medical History:  Diagnosis Date   Allergic rhinitis    Allergy    Angio-edema    Asthma    Dysmenorrhea  Fatty liver    GERD (gastroesophageal reflux disease)    Hypertension    Recurrent upper respiratory infection (URI)    Sleep apnea    Tachycardia    Past Surgical History:  Procedure Laterality Date   NO PAST SURGERIES     Family  History  Adopted: Yes  Problem Relation Age of Onset   Asthma Mother    Diabetes Mother    High blood pressure Mother    Heart disease Mother    Bipolar disorder Mother    Sleep apnea Mother    Obesity Mother    Allergic rhinitis Father    Asthma Father    Diabetes Father    High blood pressure Father    Hyperlipidemia Father    Obesity Father    Allergic rhinitis Sister    Asthma Sister    Angioedema Neg Hx    Atopy Neg Hx    Eczema Neg Hx    Immunodeficiency Neg Hx    Urticaria Neg Hx     Current Outpatient Medications:    Ascorbic Acid (SUPER C COMPLEX PO), , Disp: , Rfl:    budesonide (PULMICORT) 0.5 MG/2ML nebulizer solution, Take 2 mLs (0.5 mg total) by nebulization as needed., Disp: 60 mL, Rfl: 2   cetirizine (ZYRTEC) 10 MG tablet, Take 10 mg by mouth daily., Disp: , Rfl:    Cholecalciferol 100 MCG (4000 UT) CAPS, Take 1 capsule (4,000 Units total) by mouth daily., Disp: 30 capsule, Rfl: 0   doxycycline (VIBRA-TABS) 100 MG tablet, Take one tablet twice daily for 5 days., Disp: 10 tablet, Rfl: 1   EPINEPHrine 0.3 mg/0.3 mL IJ SOAJ injection, , Disp: , Rfl:    ferrous sulfate 325 (65 FE) MG tablet, 3 (three) times a week. , Disp: , Rfl:    fluticasone (FLONASE) 50 MCG/ACT nasal spray, Place 2 sprays into both nostrils daily., Disp: 16 g, Rfl: 5   hydrOXYzine (ATARAX) 25 MG tablet, Take 1 tablet (25 mg total) by mouth every 6 (six) hours as needed for anxiety (allergies)., Disp: 30 tablet, Rfl: 1   ipratropium (ATROVENT) 0.02 % nebulizer solution, Take 1.25 mLs (0.25 mg total) by nebulization 4 (four) times daily. Mix 0.5 vial in with levalbuterol nebulizer and can use every 6 hours as needed for shortness of breath and wheezing. (Patient taking differently: Take 0.25 mg by nebulization as needed. Mix 0.5 vial in with levalbuterol nebulizer and can use every 6 hours as needed for shortness of breath and wheezing.), Disp: 75 mL, Rfl: 3   levalbuterol (XOPENEX HFA) 45 MCG/ACT  inhaler, Inhale 2 puffs into the lungs every 6 (six) hours as needed., Disp: 15 g, Rfl: 0   levalbuterol (XOPENEX) 1.25 MG/3ML nebulizer solution, USE 1 VIAL BY NEBULIZATION EVERY 6 (SIX) HOURS AS NEEDED FOR WHEEZING., Disp: 540 mL, Rfl: 1   metoprolol succinate (TOPROL-XL) 100 MG 24 hr tablet, TAKE 1 TABLET BY MOUTH EVERY DAY WITH OR IMMEDIATELY FOLLOWING A MEAL, Disp: 90 tablet, Rfl: 3   metoprolol tartrate (LOPRESSOR) 50 MG tablet, TAKE 1 TABLET (50 MG TOTAL) BY MOUTH AS NEEDED (FOR PALPITATIONS)., Disp: 90 tablet, Rfl: 2   mometasone (ASMANEX, 120 METERED DOSES,) 220 MCG/ACT inhaler, Inhale 2 puffs into the lungs 2 (two) times daily., Disp: 1 each, Rfl: 5   montelukast (SINGULAIR) 10 MG tablet, TAKE 1 TABLET BY MOUTH EVERY DAY, Disp: 90 tablet, Rfl: 1   ondansetron (ZOFRAN) 4 MG tablet, Take 1 tablet (4 mg total) by mouth  every 8 (eight) hours as needed for nausea or vomiting., Disp: 20 tablet, Rfl: 0   pantoprazole (PROTONIX) 40 MG tablet, TAKE 1 TABLET BY MOUTH TWICE A DAY, Disp: 180 tablet, Rfl: 1   Prenatal Vit-Fe Fumarate-FA (MULTIVITAMIN-PRENATAL) 27-0.8 MG TABS tablet, Take 1 tablet by mouth daily at 12 noon., Disp: , Rfl:    Probiotic Product (PHILLIPS COLON HEALTH) CAPS, , Disp: , Rfl:    famotidine (PEPCID) 40 MG tablet, Take 1 tablet (40 mg total) by mouth daily. (Patient taking differently: Take 40 mg by mouth as needed.), Disp: 90 tablet, Rfl: 2   Levonorgestrel-Ethinyl Estradiol (AMETHIA) 0.1-0.02 & 0.01 MG tablet, Take 1 tablet by mouth daily. (Patient not taking: Reported on 07/29/2023), Disp: , Rfl:    moxifloxacin (AVELOX) 400 MG tablet, Take 1 tablet (400 mg total) by mouth daily at 8 pm for 7 days., Disp: 7 tablet, Rfl: 0   predniSONE (DELTASONE) 10 MG tablet, Take 1 tablet (10 mg total) by mouth 2 (two) times daily with a meal for 7 days., Disp: 14 tablet, Rfl: 0   sucralfate (CARAFATE) 1 g tablet, Take 1 tablet (1 g total) by mouth 4 (four) times daily -  with meals and at  bedtime., Disp: 120 tablet, Rfl: 0 Allergies  Allergen Reactions   Budesonide-Formoterol Fumarate Other (See Comments)    Causes Hypertension   Other     Tree nuts   Penicillins Hives   Raspberry Hives   Spiriva Respimat [Tiotropium Bromide Monohydrate] Other (See Comments)    Pt states medication causes dizziness   Albuterol Palpitations   Losartan Rash     ROS: A complete ROS was performed with pertinent positives/negatives noted in the HPI. The remainder of the ROS are negative.    Objective:   Today's Vitals   07/29/23 0912  BP: 120/82  Pulse: 97  Temp: 98.1 F (36.7 C)  TempSrc: Temporal  SpO2: 99%  Weight: (!) 301 lb 6.4 oz (136.7 kg)  Height: 5\' 8"  (1.727 m)    GENERAL: Well-appearing, in NAD. Well nourished.  SKIN: Pink, warm and dry. No rash.  HEENT:    HEAD: Normocephalic, non-traumatic.  EYES: Conjunctive pink without exudate. PERRL.  EARS: External ear w/o redness, swelling, masses, or lesions. EAC clear. TM's intact, translucent w/o bulging, appropriate landmarks visualized.  NOSE: Septum midline w/o deformity. Nares patent, mucosa pink and inflamed with drainage. No sinus tenderness.  THROAT: Uvula midline. Oropharynx erythematous. Tonsils non-inflamed w/o exudate. Mucus membranes pink and moist.  NECK: Trachea midline. Full ROM w/o pain or tenderness. No lymphadenopathy.  RESPIRATORY: Chest wall symmetrical. Respirations even and non-labored. Breath sounds clear to auscultation bilaterally.  CARDIAC: S1, S2 present, regular rate and rhythm. Peripheral pulses 2+ bilaterally.  EXTREMITIES: Without clubbing, cyanosis, or edema.  NEUROLOGIC: Steady, even gait.  PSYCH/MENTAL STATUS: Alert, oriented x 3. Cooperative, appropriate mood and affect.    Results for orders placed or performed in visit on 07/29/23  POCT Influenza A/B  Result Value Ref Range   Influenza A, POC Positive (A) Negative   Influenza B, POC Negative Negative  POCT rapid strep A  Result  Value Ref Range   Rapid Strep A Screen Negative Negative  POC COVID-19 BinaxNow  Result Value Ref Range   SARS Coronavirus 2 Ag Negative Negative      Assessment & Plan:  Assessment and Plan    Influenza A and Otitis Media  Positive rapid flu test. Symptoms started Friday with body aches, asthma flare, and  sinus congestion. Currently on doxycycline and prednisone from immunologist. No fever reported. -Continue doxycycline and prednisone. -Consider Tamiflu, although patient is hesitant due to potential side effects. -If symptoms do not improve, consider switching to cefdinir for ear infection -Advise rest, hydration, Tylenol or ibuprofen for body aches, Epsom salt soaks, vitamin C, and elderberry. -Continue current inhaler use.      No orders of the defined types were placed in this encounter.  Orders Placed This Encounter  Procedures   POCT Influenza A/B   POCT rapid strep A   POC COVID-19 BinaxNow    Previously tested for COVID-19:   No    Resident in a congregate (group) care setting:   No    Employed in healthcare setting:   Unknown    Pregnant:   No   Lab Orders         POCT Influenza A/B         POCT rapid strep A         POC COVID-19 BinaxNow     No images are attached to the encounter or orders placed in the encounter.  Return if symptoms worsen or fail to improve.   Salvatore Decent, FNP

## 2023-07-29 NOTE — Telephone Encounter (Signed)
Per Provider:  Moxifloxacin 400 mg 1 tablet by mouth daily for 7 days RF: 0 Prednisone 10 mg 1 tablet by mouth with meals 2 times a day for 7 days RF: 0  Prescriptions have been sent in to [facility].

## 2023-07-30 ENCOUNTER — Other Ambulatory Visit: Payer: Self-pay

## 2023-07-30 MED ORDER — LEVOFLOXACIN 500 MG PO TABS: 500.0000 mg | ORAL_TABLET | Freq: Every day | ORAL | 0 refills | Status: AC

## 2023-07-30 NOTE — Telephone Encounter (Signed)
I am fine with that! I thought I had sent you a message about that... Dr. Allena Katz called me the day that he saw her to tell me those recommendations. Sorry!   Malachi Bonds, MD [facility]

## 2023-07-30 NOTE — Telephone Encounter (Signed)
Patient contacted me on 2/11 reporting some continued drainage and sinus pain and pressure. She is s/p a number of antibiotic courses with transient improvement. I requested that our staff send in Levaquin 500 mg daily for 7 days. We sent in moxifloxacin instead due to insurance coverage.  She contacted me on 2/12 reporting that she was nervous to do the moxifloxacin because she has tolerated Levaquin in the past. She prefers to just do the Levaquin instead even if it costs more. I sent in the Levaquin instead.   Malachi Bonds, MD [facility]

## 2023-07-30 NOTE — Addendum Note (Signed)
Addended by: Alfonse Spruce on: 07/30/2023 01:57 PM   Modules accepted: Orders

## 2023-07-31 NOTE — Telephone Encounter (Signed)
New orders faxed to [facility]

## 2023-08-01 ENCOUNTER — Emergency Department (HOSPITAL_BASED_OUTPATIENT_CLINIC_OR_DEPARTMENT_OTHER)
Admission: EM | Admit: 2023-08-01 | Discharge: 2023-08-01 | Disposition: A | Discharge status: Home / Self Care | Payer: BC Managed Care – PPO

## 2023-08-01 ENCOUNTER — Ambulatory Visit: Payer: BC Managed Care – PPO | Admitting: Family Medicine

## 2023-08-01 ENCOUNTER — Other Ambulatory Visit: Payer: Self-pay

## 2023-08-01 ENCOUNTER — Emergency Department (HOSPITAL_BASED_OUTPATIENT_CLINIC_OR_DEPARTMENT_OTHER): Payer: BC Managed Care – PPO

## 2023-08-01 DIAGNOSIS — Z79899 Other long term (current) drug therapy: Secondary | ICD-10-CM | POA: Diagnosis not present

## 2023-08-01 DIAGNOSIS — R0600 Dyspnea, unspecified: Secondary | ICD-10-CM

## 2023-08-01 DIAGNOSIS — J45909 Unspecified asthma, uncomplicated: Secondary | ICD-10-CM | POA: Insufficient documentation

## 2023-08-01 DIAGNOSIS — J111 Influenza due to unidentified influenza virus with other respiratory manifestations: Secondary | ICD-10-CM

## 2023-08-01 DIAGNOSIS — Z7951 Long term (current) use of inhaled steroids: Secondary | ICD-10-CM | POA: Diagnosis not present

## 2023-08-01 DIAGNOSIS — J101 Influenza due to other identified influenza virus with other respiratory manifestations: Secondary | ICD-10-CM | POA: Diagnosis not present

## 2023-08-01 DIAGNOSIS — R079 Chest pain, unspecified: Secondary | ICD-10-CM | POA: Diagnosis not present

## 2023-08-01 DIAGNOSIS — R0602 Shortness of breath: Secondary | ICD-10-CM | POA: Diagnosis not present

## 2023-08-01 DIAGNOSIS — R002 Palpitations: Secondary | ICD-10-CM | POA: Diagnosis not present

## 2023-08-01 DIAGNOSIS — E876 Hypokalemia: Secondary | ICD-10-CM | POA: Insufficient documentation

## 2023-08-01 DIAGNOSIS — I10 Essential (primary) hypertension: Secondary | ICD-10-CM | POA: Diagnosis not present

## 2023-08-01 DIAGNOSIS — R059 Cough, unspecified: Secondary | ICD-10-CM | POA: Diagnosis not present

## 2023-08-01 DIAGNOSIS — Z8616 Personal history of COVID-19: Secondary | ICD-10-CM | POA: Diagnosis not present

## 2023-08-01 LAB — BASIC METABOLIC PANEL
Anion gap: 12 (ref 5–15)
BUN: 18 mg/dL (ref 6–20)
CO2: 24 mmol/L (ref 22–32)
Calcium: 9.1 mg/dL (ref 8.9–10.3)
Chloride: 100 mmol/L (ref 98–111)
Creatinine, Ser: 0.77 mg/dL (ref 0.44–1.00)
GFR, Estimated: >60 mL/min (ref >60)
Glucose, Bld: 121 mg/dL — ABNORMAL HIGH (ref 70–99)
Potassium: 3.1 mmol/L — ABNORMAL LOW (ref 3.5–5.1)
Sodium: 136 mmol/L (ref 135–145)

## 2023-08-01 LAB — CBC WITH DIFFERENTIAL/PLATELET
Abs Immature Granulocytes: 0.03 10*3/uL (ref 0.00–0.07)
Basophils Absolute: 0.1 10*3/uL (ref 0.0–0.1)
Basophils Relative: 1 %
Eosinophils Absolute: 0.1 10*3/uL (ref 0.0–0.5)
Eosinophils Relative: 1 %
HCT: 42.1 % (ref 36.0–46.0)
Hemoglobin: 14.1 g/dL (ref 12.0–15.0)
Immature Granulocytes: 0 %
Lymphocytes Relative: 24 %
Lymphs Abs: 2.4 10*3/uL (ref 0.7–4.0)
MCH: 27.4 pg (ref 26.0–34.0)
MCHC: 33.5 g/dL (ref 30.0–36.0)
MCV: 81.9 fL (ref 80.0–100.0)
Monocytes Absolute: 0.6 10*3/uL (ref 0.1–1.0)
Monocytes Relative: 6 %
Neutro Abs: 6.9 10*3/uL (ref 1.7–7.7)
Neutrophils Relative %: 68 %
Platelets: 334 10*3/uL (ref 150–400)
RBC: 5.14 MIL/uL — ABNORMAL HIGH (ref 3.87–5.11)
RDW: 13.2 % (ref 11.5–15.5)
WBC: 10.1 10*3/uL (ref 4.0–10.5)
nRBC: 0 % (ref 0.0–0.2)

## 2023-08-01 LAB — D-DIMER, QUANTITATIVE: D-Dimer, Quant: 0.28 ug{FEU}/mL (ref 0.00–0.50)

## 2023-08-01 LAB — MAGNESIUM: Magnesium: 1.9 mg/dL (ref 1.7–2.4)

## 2023-08-01 MED ORDER — POTASSIUM CHLORIDE CRYS ER 20 MEQ PO TBCR
40.0000 meq | EXTENDED_RELEASE_TABLET | Freq: Once | ORAL | Status: AC
  Administered 2023-08-01: 40 meq via ORAL
  Filled 2023-08-01: qty 2

## 2023-08-01 MED ORDER — METHYLPREDNISOLONE 4 MG PO TBPK: ORAL_TABLET | ORAL | 0 refills | Status: DC

## 2023-08-01 MED ORDER — IPRATROPIUM BROMIDE 0.02 % IN SOLN: 0.5000 mg | Freq: Four times a day (QID) | RESPIRATORY_TRACT | 12 refills | Status: DC

## 2023-08-01 MED ORDER — LEVALBUTEROL HCL 1.25 MG/3ML IN NEBU: 1.2500 mg | INHALATION_SOLUTION | RESPIRATORY_TRACT | 12 refills | Status: DC | PRN

## 2023-08-01 MED ORDER — SODIUM CHLORIDE 0.9 % IV BOLUS
1000.0000 mL | Freq: Once | INTRAVENOUS | Status: AC
  Administered 2023-08-01: 1000 mL via INTRAVENOUS

## 2023-08-01 MED ORDER — POTASSIUM CHLORIDE CRYS ER 20 MEQ PO TBCR: 20.0000 meq | EXTENDED_RELEASE_TABLET | Freq: Every day | ORAL | 0 refills | Status: DC

## 2023-08-01 MED ORDER — LEVALBUTEROL HCL 1.25 MG/0.5ML IN NEBU
1.2500 mg | INHALATION_SOLUTION | Freq: Once | RESPIRATORY_TRACT | Status: AC
  Administered 2023-08-01: 1.25 mg via RESPIRATORY_TRACT
  Filled 2023-08-01: qty 0.5

## 2023-08-01 NOTE — Discharge Instructions (Signed)
As discussed, x-ray without obvious pneumonia, collapsed lung or other abnormality.  D-dimer is negative flu is a very low suspicion for blood clot in the lung.  Suspect you having inflammation from occurred influenza infection.  Recommend continue breathing treatments at home.  Will place you on a Medrol Dosepak given side effects from prednisone.  Also patient potassium in the next few days as it was low most likely from your breathing treatments.  Recommend follow-up with primary care for reassessment.  Please do not hesitate to return if the worrisome signs and symptoms we discussed become apparent.

## 2023-08-01 NOTE — ED Provider Notes (Signed)
 [facility] Provider Note   CSN: [id number] Arrival date & time: 08/01/23  1438     History  Chief Complaint  Patient presents with   Palpitations    Nicole Mendoza is a 33 y.o. female.   Palpitations   33 year old female presents emergency department with cough, congestion, generalized weakness, body aches, chest tightness, exertional shortness of breath.  Patient reports symptoms present for the past 3 to 4 days.  Was seen by primary care on the 11th and diagnosed with influenza.  Was began on prednisone, doxycycline because she reportedly had wheezing at that time.  Patient's antibiotic was switched to Levaquin on 2/12 due to continued sinus pressure/drainage.  Patient states that since then, has had slightly worsening of symptoms.  States that the prednisone taper was helping her initially but due to feeling jittery, antsy and having difficulty sleeping, went to the lowest dose possible and is now only taking 10 mg of prednisone.  States that her breathing is improved with her at home nebulized therapy but also makes her jittery, heart rate increased.  Reports subjective fever over the past couple of days but without feeling feverish today.  Denies any abdominal pain, nausea, vomiting, urinary symptoms, change in bowel habits.  Denies any history of DVT/PE, recent surgeries or immobilization, known coagulopathy, malignancy, hormonal therapy.  Past medical history significant for GERD, hypertension, OSA, angioedema, asthma, tachycardia, fibromyalgia, vocal dysfunction, nonalcoholic fatty liver disease,  Home Medications Prior to Admission medications   Medication Sig Start Date End Date Taking? Authorizing Provider  ipratropium (ATROVENT) 0.02 % nebulizer solution Take 2.5 mLs (0.5 mg total) by nebulization 4 (four) times daily. 08/01/23  Yes Sherian Maroon A, PA  levalbuterol (XOPENEX) 1.25 MG/3ML nebulizer solution Take 1.25 mg by  nebulization every 4 (four) hours as needed for wheezing. 08/01/23  Yes Sherian Maroon A, PA  methylPREDNISolone (MEDROL DOSEPAK) 4 MG TBPK tablet Day 1: 8 mg PO before breakfast, 4 mg after lunch and after dinner, and 8 mg at bedtime Day 2: 4 mg PO before breakfast, after lunch, and after dinner and 8 mg at bedtime Day 3: 4 mg PO before breakfast, after lunch, after dinner, and at bedtime Day 4: 4 mg PO before breakfast, after lunch, and at bedtime Day 5: 4 mg PO before breakfast and at bedtime Day 6: 4 mg PO before breakfast 08/01/23  Yes Sherian Maroon A, PA  potassium chloride SA (KLOR-CON M) 20 MEQ tablet Take 1 tablet (20 mEq total) by mouth daily. 08/01/23  Yes Sherian Maroon A, PA  Ascorbic Acid (SUPER C COMPLEX PO)  07/19/19   [provider]  budesonide (PULMICORT) 0.5 MG/2ML nebulizer solution Take 2 mLs (0.5 mg total) by nebulization as needed. 05/20/22   Verlee Monte, MD  cetirizine (ZYRTEC) 10 MG tablet Take 10 mg by mouth daily.    [provider]  Cholecalciferol 100 MCG (4000 UT) CAPS Take 1 capsule (4,000 Units total) by mouth daily. 02/06/22   Whitmire, Dawn, FNP  doxycycline (VIBRA-TABS) 100 MG tablet Take one tablet twice daily for 5 days. 07/26/23   Alfonse Spruce, MD  EPINEPHrine 0.3 mg/0.3 mL IJ SOAJ injection  10/07/22   [provider]  famotidine (PEPCID) 40 MG tablet Take 1 tablet (40 mg total) by mouth daily. Patient taking differently: Take 40 mg by mouth as needed. 07/02/22 07/11/23  Alfonse Spruce, MD  ferrous sulfate 325 (65 FE) MG tablet 3 (three) times  a week.  08/16/19   [provider]  fluticasone (FLONASE) 50 MCG/ACT nasal spray Place 2 sprays into both nostrils daily. 09/27/22   Verlee Monte, MD  hydrOXYzine (ATARAX) 25 MG tablet Take 1 tablet (25 mg total) by mouth every 6 (six) hours as needed for anxiety (allergies). 05/30/22   Verlee Monte, MD  ipratropium (ATROVENT) 0.02 % nebulizer solution Take 1.25 mLs (0.25 mg  total) by nebulization 4 (four) times daily. Mix 0.5 vial in with levalbuterol nebulizer and can use every 6 hours as needed for shortness of breath and wheezing. Patient taking differently: Take 0.25 mg by nebulization as needed. Mix 0.5 vial in with levalbuterol nebulizer and can use every 6 hours as needed for shortness of breath and wheezing. 05/27/22   Verlee Monte, MD  levalbuterol [facility] HFA) 45 MCG/ACT inhaler Inhale 2 puffs into the lungs every 6 (six) hours as needed. 06/20/23   Alfonse Spruce, MD  levalbuterol (XOPENEX) 1.25 MG/3ML nebulizer solution USE 1 VIAL BY NEBULIZATION EVERY 6 (SIX) HOURS AS NEEDED FOR WHEEZING. 05/29/22   Verlee Monte, MD  levofloxacin (LEVAQUIN) 500 MG tablet Take 1 tablet (500 mg total) by mouth daily for 7 days. 07/30/23 08/06/23  Alfonse Spruce, MD  Levonorgestrel-Ethinyl Estradiol (AMETHIA) 0.1-0.02 & 0.01 MG tablet Take 1 tablet by mouth daily. Patient not taking: Reported on 07/29/2023    [provider]  metoprolol succinate (TOPROL-XL) 100 MG 24 hr tablet TAKE 1 TABLET BY MOUTH EVERY DAY WITH OR IMMEDIATELY FOLLOWING A MEAL 10/01/22   Loyola Mast, MD  metoprolol tartrate (LOPRESSOR) 50 MG tablet TAKE 1 TABLET (50 MG TOTAL) BY MOUTH AS NEEDED (FOR PALPITATIONS). 07/16/23   Duke Salvia, MD  mometasone (ASMANEX, 120 METERED DOSES,) 220 MCG/ACT inhaler Inhale 2 puffs into the lungs 2 (two) times daily. 04/24/23   Alfonse Spruce, MD  montelukast (SINGULAIR) 10 MG tablet TAKE 1 TABLET BY MOUTH EVERY DAY 10/14/22   Verlee Monte, MD  moxifloxacin (AVELOX) 400 MG tablet Take 1 tablet (400 mg total) by mouth daily at 8 pm for 7 days. 07/29/23 08/05/23  Alfonse Spruce, MD  ondansetron (ZOFRAN) 4 MG tablet Take 1 tablet (4 mg total) by mouth every 8 (eight) hours as needed for nausea or vomiting. 05/20/22   Verlee Monte, MD  pantoprazole (PROTONIX) 40 MG tablet TAKE 1 TABLET BY MOUTH TWICE A DAY 07/21/23   Alfonse Spruce,  MD  predniSONE (DELTASONE) 10 MG tablet Take 1 tablet (10 mg total) by mouth 2 (two) times daily with a meal for 7 days. 07/29/23 08/05/23  Alfonse Spruce, MD  Prenatal Vit-Fe Fumarate-FA (MULTIVITAMIN-PRENATAL) 27-0.8 MG TABS tablet Take 1 tablet by mouth daily at 12 noon.    [provider]  Probiotic Product (PHILLIPS COLON HEALTH) CAPS  08/16/19   [provider]  sucralfate (CARAFATE) 1 g tablet Take 1 tablet (1 g total) by mouth 4 (four) times daily -  with meals and at bedtime. 06/22/23 07/22/23  Philomena Doheny, MD      Allergies    Budesonide-formoterol fumarate, Other, Penicillins, Raspberry, Spiriva respimat [tiotropium bromide monohydrate], Albuterol, and Losartan    Review of Systems   Review of Systems  Cardiovascular:  Positive for palpitations.  All other systems reviewed and are negative.   Physical Exam Updated Vital Signs BP (!) 154/89   Pulse 92   Temp 98.6 F (37 C) (Oral)   Resp 17  LMP 07/26/2023   SpO2 100%  Physical Exam Vitals and nursing note reviewed.  Constitutional:      General: She is not in acute distress.    Appearance: She is well-developed.  HENT:     Head: Normocephalic and atraumatic.  Eyes:     Conjunctiva/sclera: Conjunctivae normal.  Cardiovascular:     Rate and Rhythm: Normal rate and regular rhythm.     Heart sounds: No murmur heard. Pulmonary:     Effort: Pulmonary effort is normal. No respiratory distress.     Breath sounds: Wheezing present. No rhonchi or rales.     Comments: Faint auscultatory respiratory wheeze. Abdominal:     Palpations: Abdomen is soft.     Tenderness: There is no abdominal tenderness. There is no guarding.  Musculoskeletal:        General: No swelling.     Cervical back: Neck supple.     Right lower leg: No edema.     Left lower leg: No edema.  Skin:    General: Skin is warm and dry.     Capillary Refill: Capillary refill takes less than 2 seconds.  Neurological:      Mental Status: She is alert.  Psychiatric:        Mood and Affect: Mood normal.     ED Results / Procedures / Treatments   Labs (all labs ordered are listed, but only abnormal results are displayed) Labs Reviewed  BASIC METABOLIC PANEL - Abnormal; Notable for the following components:      Result Value   Potassium 3.1 (*)    Glucose, Bld 121 (*)    All other components within normal limits  CBC WITH DIFFERENTIAL/PLATELET - Abnormal; Notable for the following components:   RBC 5.14 (*)    All other components within normal limits  MAGNESIUM  D-DIMER, QUANTITATIVE    EKG EKG Interpretation Date/Time:  Friday August 01 2023 14:46:28 EST Ventricular Rate:  104 PR Interval:  122 QRS Duration:  88 QT Interval:  368 QTC Calculation: 484 R Axis:   77  Text Interpretation: Sinus tachycardia Abnormal T, consider ischemia, diffuse leads similar to prior EKG Confirmed by Rolan Bucco [phone]) on 08/01/2023 3:02:50 PM  Radiology DG Chest 2 View Result Date: 08/01/2023 CLINICAL DATA:  Chest pain. Recent COVID. Currently flu a positive. EXAM: CHEST - 2 VIEW COMPARISON:  Chest radiographs 12/01/2022 FINDINGS: Cardiac silhouette and mediastinal contours are within normal limits. The lungs are clear. No pleural effusion or pneumothorax. Mild-to-moderate multilevel degenerative disc changes of the mid to upper thoracic spine. IMPRESSION: No active cardiopulmonary disease. Electronically Signed   By: Neita Garnet M.D.   On: 08/01/2023 17:16    Procedures Procedures    Medications Ordered in ED Medications  potassium chloride SA (KLOR-CON M) CR tablet 40 mEq (40 mEq Oral Given 08/01/23 1551)  sodium chloride 0.9 % bolus 1,000 mL (0 mLs Intravenous Stopped 08/01/23 1714)  levalbuterol (XOPENEX) nebulizer solution 1.25 mg (1.25 mg Nebulization Given 08/01/23 1609)    ED Course/ Medical Decision Making/ A&P                                 Medical Decision Making Amount and/or  Complexity of Data Reviewed Labs: ordered. Radiology: ordered.  Risk Prescription drug management.   This patient presents to the ED for concern of palpitations, cough, chest tightness, this involves an extensive number of treatment options, and is  a complaint that carries with it a high risk of complications and morbidity.  The differential diagnosis includes asthma, COPD, viral URI, pneumonia, PE, ACS, pneumothorax, other   Co morbidities that complicate the patient evaluation  See HPI   Additional history obtained:  Additional history obtained from EMR External records from outside source obtained and reviewed including hospital records   Lab Tests:  I Ordered, and personally interpreted labs.  The pertinent results include: No leukocytosis.  No evidence of anemia.  Placed within range.  Mild hypokalemia of 3.1 but otherwise, chest within normal limits.  No renal dysfunction.  D-dimer 0.28   Imaging Studies ordered:  I ordered imaging studies including chest x-ray I independently visualized and interpreted imaging which showed no acute abnormality I agree with the radiologist interpretation   Cardiac Monitoring: / EKG:  The patient was maintained on a cardiac monitor.  I personally viewed and interpreted the cardiac monitored which showed an underlying rhythm of: Sinus tachycardia.  No acute ischemia change EKG performed.   Consultations Obtained:  N/a   Problem List / ED Course / Critical interventions / Medication management  Cough, shortness of breath, palpitations I ordered medication including 1 L normal saline, Xopenex   Reevaluation of the patient after these medicines showed that the patient improved I have reviewed the patients home medicines and have made adjustments as needed   Social Determinants of Health:  Denies tobacco, licit drug use   Test / Admission - Considered:  Cough, shortness of breath, palpitations Vitals signs significant for  hypertension blood pressure 154/89. Otherwise within normal range and stable throughout visit. Laboratory/imaging studies significant for: See above 33 year old female presents emergency department with complaints of cough, congestion, generalized weakness, chest tightness, exertional dyspnea.  Symptoms present for the past 3 to 4 days.  Was diagnosed with influenza on 07/29/2023 and was began on doxycycline as well as prednisone which was subsequently switched to Levaquin on 2/12.  Was on prednisone taper which she states was significantly helping her symptoms but stopped taking the tapered dose and went down to the lowest dose possible after 2 days of prednisone due to side effects that was causing her.  Also states that she has been minimally using her nebulized Xopenex at home due to it making her feel jittery and increasing her heart rate.  States that she is only had it once in the past 48 hours.  On exam, faint expiratory wheeze appreciated bilateral lung fields.  Workup today overall reassuring.  Labs concerning for slight hypokalemia 3.1 supplemented orally while in the ED.  D-dimer negative so low suspicion for PE.  Chest x-ray obtained which was negative for pneumonia, pneumothorax or other acute cardiopulmonary abnormality.  EKG without evidence of acute ischemic change.  Treated with Xopenex while in the ED and did note significant improvement of symptoms.  Suspect more asthma exacerbation of cause of symptoms given improvement with Xopenex as well as prednisone.  Patient states that she has had less side effect profile with Medrol Dosepak and requesting corticosteroid change so we will switch her from prednisone to Medrol Dosepak.  Will also fill her at home nebulized treatment.  Will recommend follow-up with PCP in the outpatient setting.  Treatment plan discussed at length with patient and she knowledge understanding was agreeable to said plan.  Patient overall well-appearing, afebrile in no acute  distress. Worrisome signs and symptoms were discussed with the patient, and the patient acknowledged understanding to return to the ED if noticed.  Patient was stable upon discharge.          Final Clinical Impression(s) / ED Diagnoses Final diagnoses:  Influenza  Dyspnea, unspecified type  Palpitations  Hypokalemia    Rx / DC Orders ED Discharge Orders          Ordered    potassium chloride SA (KLOR-CON M) 20 MEQ tablet  Daily        08/01/23 1711    levalbuterol (XOPENEX) 1.25 MG/3ML nebulizer solution  Every 4 hours PRN        08/01/23 1711    ipratropium (ATROVENT) 0.02 % nebulizer solution  4 times daily        08/01/23 1711    methylPREDNISolone (MEDROL DOSEPAK) 4 MG TBPK tablet        08/01/23 1711              Peter Garter, [place] 08/02/23 1921    Rolan Bucco, MD 08/09/23 [phone]

## 2023-08-01 NOTE — ED Triage Notes (Signed)
Pt presents with complaints of palpitation. States she was tested +Flu X 3 days. Endorses SOB and Chest tightness

## 2023-08-04 ENCOUNTER — Other Ambulatory Visit: Payer: Self-pay

## 2023-08-04 ENCOUNTER — Ambulatory Visit (INDEPENDENT_AMBULATORY_CARE_PROVIDER_SITE_OTHER): Payer: BC Managed Care – PPO | Admitting: Internal Medicine

## 2023-08-04 VITALS — BP 126/74 | HR 101 | Temp 98.1°F | Resp 20 | Wt 290.8 lb

## 2023-08-04 DIAGNOSIS — J4540 Moderate persistent asthma, uncomplicated: Secondary | ICD-10-CM | POA: Diagnosis not present

## 2023-08-04 DIAGNOSIS — J101 Influenza due to other identified influenza virus with other respiratory manifestations: Secondary | ICD-10-CM | POA: Diagnosis not present

## 2023-08-04 DIAGNOSIS — D806 Antibody deficiency with near-normal immunoglobulins or with hyperimmunoglobulinemia: Secondary | ICD-10-CM

## 2023-08-04 DIAGNOSIS — J302 Other seasonal allergic rhinitis: Secondary | ICD-10-CM

## 2023-08-04 DIAGNOSIS — J3089 Other allergic rhinitis: Secondary | ICD-10-CM

## 2023-08-04 DIAGNOSIS — J383 Other diseases of vocal cords: Secondary | ICD-10-CM

## 2023-08-04 MED ORDER — ASMANEX (120 METERED DOSES) 220 MCG/ACT IN AEPB: 2.0000 | INHALATION_SPRAY | Freq: Two times a day (BID) | RESPIRATORY_TRACT | 5 refills | Status: DC

## 2023-08-04 MED ORDER — MONTELUKAST SODIUM 10 MG PO TABS: 10.0000 mg | ORAL_TABLET | Freq: Every day | ORAL | 1 refills | Status: DC

## 2023-08-04 NOTE — Patient Instructions (Addendum)
Asthma, recovering from Flu A and exacerbation  - Lungs sound clear today  -  For next 2 weeks increase asmanex to 4 puffs twice daily  - Prednisone taper: 10mg  for 3 days, then 5 mg for 3 days then stop  - Daily controller medication(s): Singulair 10mg  daily and Asmanex 1-2 puffs twice daily  - Prior to physical activity: Xopenex 2 puffs 10-15 minutes before physical activity. - Rescue medications: Xopenex 4 puffs every 4-6 hours as needed - Changes during respiratory infections or worsening symptoms: Add on Pulmicort 0.5mg  to one treatment twice daily for TWO WEEKS. - Asthma control goals:  * Full participation in all desired activities (may need albuterol before activity) * Albuterol use two time or less a week on average (not counting use with activity) * Cough interfering with sleep two time or less a month * Oral steroids no more than once a year * No hospitalizations  Allergic rhinitis - Continue saline rinses several times a day - Continue cetirizine 10 mg once a day as needed for a runny nose or itch. - Continue Flonase 2 sprays in each nostril twice a day for nasal symptoms - Continue with Astelin two sprays per nostril twice daily in case this is related to postnasal drip.   Recurrent infections - We will continue with the Privagen every 3 weeks  - Consider switching to Acentiv for hyperimmune product  - Finish levaquin which has already been prescribed    Reflux - Continue dietary and lifestyle modifications as listed below - Continue with Dexilant as you are doing.  - Continue with famotidine 40mg  nightly. - Continue with a refill of the Carafate.   Vocal cord dysfunction - Continue with speech therapy at home lessons as you are doing.  - Continue with the belly breathing exercises.   Food allergy - Continue to avoid tree nuts.  In case of an allergic reaction, take Benadryl 50 mg every 4 hours, and if life-threatening symptoms occur, inject with AuviQ 0.3 mg. -  We will get that mixed tree nut challenge done at some point.   Hold off on penicillin challenge until you are fully recovered (4 weeks)   Follow up: with Dr. Dellis Anes as previously planned   Thank you so much for letting me partake in your care today.  Don't hesitate to reach out if you have any additional concerns!  Ferol Luz, MD  [facility] [place], [place]

## 2023-08-04 NOTE — Progress Notes (Signed)
 FOLLOW UP Date of Service/Encounter:  08/11/23  Subjective:  Nicole Mendoza (DOB: January 27, 1991) is a 33 y.o. female who [facility] in re-evaluation of the following: asthma, SAD, POTS,  History obtained from: chart review and patient.  For Review, LV was on 06/17/23  with Dr. Dellis Anes seen for acute visit for flu and concern for asthma flare  . See below for summary of history and diagnostics.   Therapeutic plans/changes recommended: saw Dr. Allena Katz, plan to change privagen infusions to every 3 weeks  ED bote: 08/01/23: Cough, shortness of breath, palpitations Vitals signs significant for hypertension blood pressure 154/89. Otherwise within normal range and stable throughout visit. Laboratory/imaging studies significant for: See above 33 year old female presents emergency department with complaints of cough, congestion, generalized weakness, chest tightness, exertional dyspnea.  Symptoms present for the past 3 to 4 days.  Was diagnosed with influenza on 07/29/2023 and was began on doxycycline as well as prednisone which was subsequently switched to Levaquin on 2/12.  Was on prednisone taper which she states was significantly helping her symptoms but stopped taking the tapered dose and went down to the lowest dose possible after 2 days of prednisone due to side effects that was causing her.  Also states that she has been minimally using her nebulized Xopenex at home due to it making her feel jittery and increasing her heart rate.  States that she is only had it once in the past 48 hours.  On exam, faint expiratory wheeze appreciated bilateral lung fields.  Workup today overall reassuring.  Labs concerning for slight hypokalemia 3.1 supplemented orally while in the ED.  D-dimer negative so low suspicion for PE.  Chest x-ray obtained which was negative for pneumonia, pneumothorax or other acute cardiopulmonary abnormality.  EKG without evidence of acute ischemic  change.  Treated with Xopenex while in the ED and did note significant improvement of symptoms.  Suspect more asthma exacerbation of cause of symptoms given improvement with Xopenex as well as prednisone.  Patient states that she has had less side effect profile with Medrol Dosepak and requesting corticosteroid change so we will switch her from prednisone to Medrol Dosepak.  Will also fill her at home nebulized treatment.  Will recommend follow-up with PCP in the outpatient setting.  Treatment plan discussed at length with patient and she knowledge understanding was agreeable to said plan.  Patient overall well-appearing, afebrile in no acute distress. Worrisome signs and symptoms were discussed with the patient, and the patient acknowledged understanding to return to the ED if noticed. Patient was stable upon discharge.  --------------------------------------------------- Today presents for follow-up. Discussed the use of AI scribe software for clinical note transcription with the patient, who gave verbal consent to proceed.  History of Present Illness   Nicole Mendoza is a 33 year old female with asthma and autonomic dysfunction who presents with concerns about breakthrough infections and medication management. She was referred by Dr. Dellis Anes for evaluation of breakthrough infections on Privigen.  She has been experiencing breakthrough infections while on Privigen. She has not yet started the recommended dosing change to every three weeks but received her last infusion on Monday, which she believes helped manage her symptoms last week.  Her asthma management includes the use of Asmanex, two pumps twice a day. She has not used Xopenex since the previous night and reports severe chest tightness, which was relieved by Xopenex. She experiences symptoms of vocal cord dysfunction (VCD), which complicates her breathing  issues. She describes chest tightness and shortness of breath, especially when active. She  also reports anxiety and emotional distress when on systemic steroids, which she attributes to her autonomic issues. She has been on a tapering dose of prednisone, starting at 40 mg, then reducing to 30 mg, and currently at 10 mg twice a day. She reports high heart rates and believes this may be due to deconditioning related to fibromyalgia and other underlying conditions.  She reports autonomic dysfunction, which causes her to dehydrate easily and experience severe tachycardia. This was the primary reason for her ER visit, rather than breathing difficulties. She also experienced leg cramping, suspecting low potassium levels.  She visited the ER on August 04, 2023, due to flu symptoms and was diagnosed with the flu. She also tested positive for COVID-19 in December, shortly after seeing Dr. Dellis Anes. Despite these infections, COVID-19 did not affect her breathing significantly.  She has lingering congestion and ear pressure since last week, with her ear popping and difficulty opening her jaw. She attributes some of her symptoms to the flu and reports taking Levaquin for congestion and green sputum, with two more days of treatment remaining.         All medications reviewed by clinical staff and updated in chart. No new pertinent medical or surgical history except as noted in HPI.  ROS: All others negative except as noted per HPI.   Objective:  BP 126/74   Pulse (!) 101   Temp 98.1 F (36.7 C) (Temporal)   Resp 20   Wt 290 lb 12.8 oz (131.9 kg)   LMP 07/26/2023   SpO2 100%   BMI 44.22 kg/m  Body mass index is 44.22 kg/m. Physical Exam: General Appearance:  Alert, cooperative, no distress, appears stated age  Head:  Normocephalic, without obvious abnormality, atraumatic  Eyes:  Conjunctiva clear, EOM's intact  Ears TM- not erythematous, mild serous fluid b/l  and EACs normal bilaterally  Nose: Nares normal,  erythematous nasal mucosa, no visible anterior polyps, and septum midline   Throat: Lips, tongue normal; teeth and gums normal, + cobblestoning  Neck: Supple, symmetrical  Lungs:   clear to auscultation bilaterally, Respirations unlabored, no coughing  Heart:  regular rate and rhythm and no murmur, Appears well perfused  Extremities: No edema  Skin: Skin color, texture, turgor normal and no rashes or lesions on visualized portions of skin  Neurologic: No gross deficits   Labs:  Lab Orders  No laboratory test(s) ordered today      Assessment/Plan   Asthma, recovering from Flu A and exacerbation  - Lungs sound clear today  -  For next 2 weeks increase asmanex to 4 puffs twice daily  - Prednisone taper: 10mg  for 3 days, then 5 mg for 3 days then stop  - Daily controller medication(s): Singulair 10mg  daily and Asmanex 1-2 puffs twice daily  - Prior to physical activity: Xopenex 2 puffs 10-15 minutes before physical activity. - Rescue medications: Xopenex 4 puffs every 4-6 hours as needed - Changes during respiratory infections or worsening symptoms: Add on Pulmicort 0.5mg  to one treatment twice daily for TWO WEEKS. - Asthma control goals:  * Full participation in all desired activities (may need albuterol before activity) * Albuterol use two time or less a week on average (not counting use with activity) * Cough interfering with sleep two time or less a month * Oral steroids no more than once a year * No hospitalizations  Allergic rhinitis - Continue saline  rinses several times a day - Continue cetirizine 10 mg once a day as needed for a runny nose or itch. - Continue Flonase 2 sprays in each nostril twice a day for nasal symptoms - Continue with Astelin two sprays per nostril twice daily in case this is related to postnasal drip.   Recurrent infections - We will continue with the Privagen every 3 weeks  - Consider switching to Acentiv for hyperimmune product  - Finish levaquin which has already been prescribed    Reflux - Continue dietary and  lifestyle modifications as listed below - Continue with Dexilant as you are doing.  - Continue with famotidine 40mg  nightly. - Continue with a refill of the Carafate.   Vocal cord dysfunction - Continue with speech therapy at home lessons as you are doing.  - Continue with the belly breathing exercises.   Food allergy - Continue to avoid tree nuts.  In case of an allergic reaction, take Benadryl 50 mg every 4 hours, and if life-threatening symptoms occur, inject with AuviQ 0.3 mg. - We will get that mixed tree nut challenge done at some point.   Hold off on penicillin challenge until you are fully recovered (4 weeks)   Follow up: with Dr. Dellis Anes as previously planned   Other: reviewed spirometry technique and reviewed inhaler technique  Thank you so much for letting me partake in your care today.  Don't hesitate to reach out if you have any additional concerns!  Ferol Luz, MD  [facility]- [place], [place]

## 2023-08-05 ENCOUNTER — Telehealth: Payer: Self-pay | Admitting: Family Medicine

## 2023-08-05 DIAGNOSIS — F419 Anxiety disorder, unspecified: Secondary | ICD-10-CM | POA: Diagnosis not present

## 2023-08-05 NOTE — Telephone Encounter (Signed)
Please give the patient a call . Pt stated she was in hospital 3 days ago and her potassium was high and she need that check. Due to weather I let pt know we are closed and she refuse a virtual video because of her potassium

## 2023-08-05 NOTE — Telephone Encounter (Signed)
Spoke to patient and she reschedule her appointment from 08/05/22 -  08/13/23 due to needs to be in person.    Can you send in a RX for Potasium for her till her appointment or does she need to take an OTC Potasium? Please review and advise.   Thanks. Dm/cma

## 2023-08-06 ENCOUNTER — Telehealth: Payer: Self-pay | Admitting: Internal Medicine

## 2023-08-06 ENCOUNTER — Ambulatory Visit: Payer: BC Managed Care – PPO | Admitting: Family Medicine

## 2023-08-06 NOTE — Telephone Encounter (Signed)
New Message:     Patient says she would like to talk to [facility] please. She says she had a rough night last night.    STAT if HR is under 50 or over 120 (normal HR is 60-100 beats per minute)  What is your heart rate? 120 laying down- did not sleep  Do you have a log of your heart rate readings (document readings)?   Do you have any other symptoms? Never had it as bad as she did last night

## 2023-08-06 NOTE — Telephone Encounter (Signed)
Patient called because she was having intermittent tachycardia along with anxiety.  She is taking her 100 mg succinate, however she was not sure if she needed to go to the ER for evaluation.  She has recently been dehydrated from an illness.  I instructed the patient to continue to hydrate, take her medicines as prescribed.  She has metoprolol to tartrate as needed.  She has not yet taken this medication.  Her heart rate got up to 140 bpm whenever she got up to ambulate.  She states she will take it, and if she does not feel better, she will go to the emergency department.

## 2023-08-07 ENCOUNTER — Encounter: Payer: Self-pay | Admitting: Internal Medicine

## 2023-08-07 ENCOUNTER — Ambulatory Visit: Payer: BC Managed Care – PPO | Admitting: Family Medicine

## 2023-08-07 NOTE — Telephone Encounter (Signed)
Spoke with pt who reports she is recovering from Flu, diagnosed 2 weeks ago.  Pt developed tachycardia 2 nights ago and continues to struggle with increased heart rate, feelings of anxiousness and lightheadedness.  Pt does not have current BP or HR  information. Pt last seen by Otilio Saber 01/25. Reiterated importance of hydration, salt and compression.  Requested pt take orthostatic vital signs and send through MyChart.  Pt verbalizes understanding and agrees with current plan.

## 2023-08-08 ENCOUNTER — Encounter: Payer: Self-pay | Admitting: Allergy & Immunology

## 2023-08-08 ENCOUNTER — Ambulatory Visit: Payer: BC Managed Care – PPO | Admitting: Pulmonary Disease

## 2023-08-08 ENCOUNTER — Encounter: Payer: Self-pay | Admitting: Pulmonary Disease

## 2023-08-08 VITALS — BP 162/81 | HR 119 | Ht 68.0 in | Wt 290.0 lb

## 2023-08-08 DIAGNOSIS — J4540 Moderate persistent asthma, uncomplicated: Secondary | ICD-10-CM | POA: Diagnosis not present

## 2023-08-08 MED ORDER — BUDESONIDE 0.25 MG/2ML IN SUSP: 0.2500 mg | Freq: Two times a day (BID) | RESPIRATORY_TRACT | 12 refills | Status: AC | PRN

## 2023-08-08 NOTE — Progress Notes (Signed)
Synopsis: Hx of asthma and VCD  Subjective:   PATIENT ID: Truddie Coco GENDER: female DOB: [birth year], MRN: [id number]  HPI  Chief Complaint  Patient presents with   Follow-up    Pt states she had the Covid/ flu about 2 weeks breathing seems fine but she unsure with all the other health issues she has   Evalyse Stroope is a 33 year old female, past medical history of gastroesophageal reflux, obesity, BMI 44, sleep apnea, hypertension, longstanding history of asthma as well as VCD.  She recently saw her primary care provider had ongoing symptoms chest tightness shortness of breath and diffuse wheezing was increased on her steroids.  However seen today in the office she is clear no symptoms no wheezing.  I suspect she was having a VCD flare at the time.  I did speak directly with Dr. Dellis Anes her asthma/allergist today via phone.   OV 01/03/2023: here today for follow up. Her vcd is better she thinks. Recent exacerbations on abx and steroids. Had igg levels checked. Seeing dr. Dellis Anes and getting replacement IVIG. She thinks this is helping and preventing her from getting sick. We talked today about the importance of weightloss.   OV 08/07/22: Following a recent flu infection two weeks ago, she experienced exacerbation of her asthma, which is typically asymptomatic when she is not ill. This has led to increased respiratory symptoms, including chest tightness and shortness of breath, particularly when active. She has been on a tapering course of prednisone for two weeks, starting at a higher dose and currently at 10 mg daily, with plans to reduce to 5 mg. She attributes severe tachycardia and chest tightness to the prednisone. She uses Xopenex nebulizer treatments, sometimes combined with Atrovent, but avoids Pulmicort due to adverse reactions.  Her autonomic dysfunction is exacerbated by prednisone and nebulizer treatments, causing severe tachycardia and anxiety. Her heart rate increases  significantly with minimal activity, such as turning over in bed. She has a history of vocal cord dysfunction and fibromyalgia, which complicates her respiratory symptoms. Vocal cord exercises taught by speech therapy provide some relief.  She was recently in the emergency room for high heart rate and dehydration. She has not used her nebulizer since Wednesday night due to triggering autonomic issues. She is concerned about deconditioning due to limited physical activity and uses a shower chair for bathing.  She is on IVIG infusions every three weeks for an immune system condition, which she believes helps reduce the frequency of infections.  Past Medical History:  Diagnosis Date   Allergic rhinitis    Allergy    Angio-edema    Asthma    Dysmenorrhea    Fatty liver    GERD (gastroesophageal reflux disease)    Hypertension    Recurrent upper respiratory infection (URI)    Sleep apnea    Tachycardia      Family History  Adopted: Yes  Problem Relation Age of Onset   Asthma Mother    Diabetes Mother    High blood pressure Mother    Heart disease Mother    Bipolar disorder Mother    Sleep apnea Mother    Obesity Mother    Allergic rhinitis Father    Asthma Father    Diabetes Father    High blood pressure Father    Hyperlipidemia Father    Obesity Father    Allergic rhinitis Sister    Asthma Sister    Angioedema Neg Hx    Atopy Neg Hx  Eczema Neg Hx    Immunodeficiency Neg Hx    Urticaria Neg Hx      Social History   Socioeconomic History   Marital status: Married    Spouse name: Greig Castilla   Number of children: 0   Years of education: Not on file   Highest education level: Not on file  Occupational History   Occupation: Hospice RN  Tobacco Use   Smoking status: Never   Smokeless tobacco: Never  Vaping Use   Vaping status: Never Used  Substance and Sexual Activity   Alcohol use: No    Alcohol/week: 0.0 standard drinks of alcohol   Drug use: No   Sexual  activity: Yes    Birth control/protection: Pill  Other Topics Concern   Not on file  Social History Narrative   Not on file   Social Drivers of Health   Financial Resource Strain: Not on file  Food Insecurity: Not on file  Transportation Needs: Not on file  Physical Activity: Not on file  Stress: Not on file  Social Connections: Unknown (10/16/2022)   Received from [facility], [facility]   Social Network    Social Network: Not on file  Intimate Partner Violence: Unknown (10/16/2022)   Received from [employer], [facility]   HITS    Physically Hurt: Not on file    Insult or Talk Down To: Not on file    Threaten Physical Harm: Not on file    Scream or Curse: Not on file     Allergies  Allergen Reactions   Budesonide-Formoterol Fumarate Other (See Comments)    Causes Hypertension   Other     Tree nuts   Penicillins Hives   Raspberry Hives   Spiriva Respimat [Tiotropium Bromide Monohydrate] Other (See Comments)    Pt states medication causes dizziness   Albuterol Palpitations   Losartan Rash     Outpatient Medications Prior to Visit  Medication Sig Dispense Refill   Ascorbic Acid (SUPER C COMPLEX PO)      cetirizine (ZYRTEC) 10 MG tablet Take 10 mg by mouth daily.     Cholecalciferol 100 MCG (4000 UT) CAPS Take 1 capsule (4,000 Units total) by mouth daily. 30 capsule 0   EPINEPHrine 0.3 mg/0.3 mL IJ SOAJ injection      ferrous sulfate 325 (65 FE) MG tablet 3 (three) times a week.      fluticasone (FLONASE) 50 MCG/ACT nasal spray Place 2 sprays into both nostrils daily. 16 g 5   hydrOXYzine (ATARAX) 25 MG tablet Take 1 tablet (25 mg total) by mouth every 6 (six) hours as needed for anxiety (allergies). 30 tablet 1   ipratropium (ATROVENT) 0.02 % nebulizer solution Take 1.25 mLs (0.25 mg total) by nebulization 4 (four) times daily. Mix 0.5 vial in with levalbuterol nebulizer and can use every 6 hours as needed for shortness of breath and wheezing. (Patient taking  differently: Take 0.25 mg by nebulization as needed. Mix 0.5 vial in with levalbuterol nebulizer and can use every 6 hours as needed for shortness of breath and wheezing.) 75 mL 3   ipratropium (ATROVENT) 0.02 % nebulizer solution Take 2.5 mLs (0.5 mg total) by nebulization 4 (four) times daily. 75 mL 12   levalbuterol (XOPENEX HFA) 45 MCG/ACT inhaler Inhale 2 puffs into the lungs every 6 (six) hours as needed. 15 g 0   levalbuterol (XOPENEX) 1.25 MG/3ML nebulizer solution USE 1 VIAL BY NEBULIZATION EVERY 6 (SIX) HOURS AS NEEDED FOR WHEEZING. 540  mL 1   levalbuterol (XOPENEX) 1.25 MG/3ML nebulizer solution Take 1.25 mg by nebulization every 4 (four) hours as needed for wheezing. 72 mL 12   Levonorgestrel-Ethinyl Estradiol (AMETHIA) 0.1-0.02 & 0.01 MG tablet Take 1 tablet by mouth daily.     metoprolol succinate (TOPROL-XL) 100 MG 24 hr tablet TAKE 1 TABLET BY MOUTH EVERY DAY WITH OR IMMEDIATELY FOLLOWING A MEAL 90 tablet 3   metoprolol tartrate (LOPRESSOR) 50 MG tablet TAKE 1 TABLET (50 MG TOTAL) BY MOUTH AS NEEDED (FOR PALPITATIONS). 90 tablet 2   mometasone (ASMANEX, 120 METERED DOSES,) 220 MCG/ACT inhaler Inhale 2 puffs into the lungs 2 (two) times daily. 1 each 5   montelukast (SINGULAIR) 10 MG tablet Take 1 tablet (10 mg total) by mouth daily. 90 tablet 1   ondansetron (ZOFRAN) 4 MG tablet Take 1 tablet (4 mg total) by mouth every 8 (eight) hours as needed for nausea or vomiting. 20 tablet 0   pantoprazole (PROTONIX) 40 MG tablet TAKE 1 TABLET BY MOUTH TWICE A DAY 180 tablet 1   potassium chloride SA (KLOR-CON M) 20 MEQ tablet Take 1 tablet (20 mEq total) by mouth daily. 3 tablet 0   Prenatal Vit-Fe Fumarate-FA (MULTIVITAMIN-PRENATAL) 27-0.8 MG TABS tablet Take 1 tablet by mouth daily at 12 noon.     Probiotic Product (PHILLIPS COLON HEALTH) CAPS      budesonide (PULMICORT) 0.5 MG/2ML nebulizer solution Take 2 mLs (0.5 mg total) by nebulization as needed. 60 mL 2   doxycycline (VIBRA-TABS) 100  MG tablet Take one tablet twice daily for 5 days. (Patient not taking: Reported on 08/08/2023) 10 tablet 1   famotidine (PEPCID) 40 MG tablet Take 1 tablet (40 mg total) by mouth daily. (Patient taking differently: Take 40 mg by mouth as needed.) 90 tablet 2   methylPREDNISolone (MEDROL DOSEPAK) 4 MG TBPK tablet Day 1: 8 mg PO before breakfast, 4 mg after lunch and after dinner, and 8 mg at bedtime Day 2: 4 mg PO before breakfast, after lunch, and after dinner and 8 mg at bedtime Day 3: 4 mg PO before breakfast, after lunch, after dinner, and at bedtime Day 4: 4 mg PO before breakfast, after lunch, and at bedtime Day 5: 4 mg PO before breakfast and at bedtime Day 6: 4 mg PO before breakfast (Patient not taking: Reported on 08/08/2023) 21 each 0   sucralfate (CARAFATE) 1 g tablet Take 1 tablet (1 g total) by mouth 4 (four) times daily -  with meals and at bedtime. 120 tablet 0   No facility-administered medications prior to visit.    Review of Systems  Constitutional:  Positive for malaise/fatigue. Negative for chills, fever and weight loss.  HENT:  Negative for congestion, sinus pain and sore throat.   Eyes: Negative.   Respiratory:  Positive for cough, shortness of breath and wheezing. Negative for hemoptysis and sputum production.   Cardiovascular:  Positive for chest pain. Negative for palpitations, orthopnea, claudication and leg swelling.  Gastrointestinal:  Negative for abdominal pain, heartburn, nausea and vomiting.  Genitourinary: Negative.   Musculoskeletal:  Negative for joint pain and myalgias.  Skin:  Negative for rash.  Neurological:  Negative for weakness.  Endo/Heme/Allergies: Negative.   Psychiatric/Behavioral: Negative.        Objective:   Vitals:   08/08/23 1046  BP: (!) 162/81  Pulse: (!) 119  SpO2: 100%  Weight: 290 lb (131.5 kg)  Height: 5\' 8"  (1.727 m)     Physical Exam Constitutional:  General: She is not in acute distress.    Appearance: Normal  appearance. She is obese.  Eyes:     General: No scleral icterus.    Conjunctiva/sclera: Conjunctivae normal.  Cardiovascular:     Rate and Rhythm: Normal rate and regular rhythm.  Pulmonary:     Breath sounds: No wheezing, rhonchi or rales.  Musculoskeletal:     Right lower leg: No edema.     Left lower leg: No edema.  Skin:    General: Skin is warm and dry.  Neurological:     General: No focal deficit present.    CBC    Component Value Date/Time   WBC 10.1 08/01/2023 1447   RBC 5.14 (H) 08/01/2023 1447   HGB 14.1 08/01/2023 1447   HGB 14.0 06/17/2023 1235   HCT 42.1 08/01/2023 1447   HCT 42.4 06/17/2023 1235   PLT 334 08/01/2023 1447   PLT 329 06/17/2023 1235   MCV 81.9 08/01/2023 1447   MCV 85 06/17/2023 1235   MCH 27.4 08/01/2023 1447   MCHC 33.5 08/01/2023 1447   RDW 13.2 08/01/2023 1447   RDW 12.7 06/17/2023 1235   LYMPHSABS 2.4 08/01/2023 1447   LYMPHSABS 2.7 06/17/2023 1235   MONOABS 0.6 08/01/2023 1447   EOSABS 0.1 08/01/2023 1447   EOSABS 0.1 06/17/2023 1235   BASOSABS 0.1 08/01/2023 1447   BASOSABS 0.1 06/17/2023 1235     Chest imaging: CXR 08/01/23 Cardiac silhouette and mediastinal contours are within normal limits. The lungs are clear. No pleural effusion or pneumothorax. Mild-to-moderate multilevel degenerative disc changes of the mid to upper thoracic spine  PFT:     No data to display          Labs:  Path:  Echo:  Heart Catheterization:       Assessment & Plan:   Moderate persistent asthma without complication - Plan: budesonide (PULMICORT) 0.25 MG/2ML nebulizer solution  Discussion: Omunique Pederson is a 33 year old female, past medical history of gastroesophageal reflux, obesity, BMI 44, sleep apnea, hypertension, longstanding history of asthma as well as VCD who returns to pulmonary clinic for follow up.  Asthma Exacerbation Recent flu infection leading to increased symptoms. Currently on prednisone taper with last day of  10mg  today. Lungs sound clear on examination, peak flow at best level. -Taper prednisone to 5mg  for three days starting today. -Consider Atrovent nebulizer as rescue therapy if needed. -Try budesonide 0.25mg  nebulizer when well to assess tolerance.  Autonomic dysfunction Experiencing severe tachycardia and chest tightness, possibly exacerbated by prednisone use. -Continue to monitor symptoms and consider further evaluation if symptoms persist after prednisone taper.  Hypertension Elevated blood pressure likely secondary to prednisone use. -Continue to monitor blood pressure at home and report any persistently high readings.  Deconditioning Limited mobility due to recent illness and chest tightness. -Encouraged to increase activity as tolerated, including walking around home and neighborhood.  Gastroesophageal reflux disease Worsening symptoms likely due to prednisone. -Plan to reassess symptoms after completion of prednisone taper.  Follow-up in 6 months or sooner if any worsening symptoms.  Melody Comas, MD [facility] Office: [phone]   Current Outpatient Medications:    Ascorbic Acid (SUPER C COMPLEX PO), , Disp: , Rfl:    budesonide (PULMICORT) 0.25 MG/2ML nebulizer solution, Take 2 mLs (0.25 mg total) by nebulization 2 (two) times daily as needed., Disp: 120 mL, Rfl: 12   cetirizine (ZYRTEC) 10 MG tablet, Take 10 mg by mouth daily., Disp: , Rfl:  Cholecalciferol 100 MCG (4000 UT) CAPS, Take 1 capsule (4,000 Units total) by mouth daily., Disp: 30 capsule, Rfl: 0   EPINEPHrine 0.3 mg/0.3 mL IJ SOAJ injection, , Disp: , Rfl:    ferrous sulfate 325 (65 FE) MG tablet, 3 (three) times a week. , Disp: , Rfl:    fluticasone (FLONASE) 50 MCG/ACT nasal spray, Place 2 sprays into both nostrils daily., Disp: 16 g, Rfl: 5   hydrOXYzine (ATARAX) 25 MG tablet, Take 1 tablet (25 mg total) by mouth every 6 (six) hours as needed for anxiety (allergies)., Disp: 30  tablet, Rfl: 1   ipratropium (ATROVENT) 0.02 % nebulizer solution, Take 1.25 mLs (0.25 mg total) by nebulization 4 (four) times daily. Mix 0.5 vial in with levalbuterol nebulizer and can use every 6 hours as needed for shortness of breath and wheezing. (Patient taking differently: Take 0.25 mg by nebulization as needed. Mix 0.5 vial in with levalbuterol nebulizer and can use every 6 hours as needed for shortness of breath and wheezing.), Disp: 75 mL, Rfl: 3   ipratropium (ATROVENT) 0.02 % nebulizer solution, Take 2.5 mLs (0.5 mg total) by nebulization 4 (four) times daily., Disp: 75 mL, Rfl: 12   levalbuterol (XOPENEX HFA) 45 MCG/ACT inhaler, Inhale 2 puffs into the lungs every 6 (six) hours as needed., Disp: 15 g, Rfl: 0   levalbuterol (XOPENEX) 1.25 MG/3ML nebulizer solution, USE 1 VIAL BY NEBULIZATION EVERY 6 (SIX) HOURS AS NEEDED FOR WHEEZING., Disp: 540 mL, Rfl: 1   levalbuterol (XOPENEX) 1.25 MG/3ML nebulizer solution, Take 1.25 mg by nebulization every 4 (four) hours as needed for wheezing., Disp: 72 mL, Rfl: 12   Levonorgestrel-Ethinyl Estradiol (AMETHIA) 0.1-0.02 & 0.01 MG tablet, Take 1 tablet by mouth daily., Disp: , Rfl:    metoprolol succinate (TOPROL-XL) 100 MG 24 hr tablet, TAKE 1 TABLET BY MOUTH EVERY DAY WITH OR IMMEDIATELY FOLLOWING A MEAL, Disp: 90 tablet, Rfl: 3   metoprolol tartrate (LOPRESSOR) 50 MG tablet, TAKE 1 TABLET (50 MG TOTAL) BY MOUTH AS NEEDED (FOR PALPITATIONS)., Disp: 90 tablet, Rfl: 2   mometasone (ASMANEX, 120 METERED DOSES,) 220 MCG/ACT inhaler, Inhale 2 puffs into the lungs 2 (two) times daily., Disp: 1 each, Rfl: 5   montelukast (SINGULAIR) 10 MG tablet, Take 1 tablet (10 mg total) by mouth daily., Disp: 90 tablet, Rfl: 1   ondansetron (ZOFRAN) 4 MG tablet, Take 1 tablet (4 mg total) by mouth every 8 (eight) hours as needed for nausea or vomiting., Disp: 20 tablet, Rfl: 0   pantoprazole (PROTONIX) 40 MG tablet, TAKE 1 TABLET BY MOUTH TWICE A DAY, Disp: 180 tablet,  Rfl: 1   potassium chloride SA (KLOR-CON M) 20 MEQ tablet, Take 1 tablet (20 mEq total) by mouth daily., Disp: 3 tablet, Rfl: 0   Prenatal Vit-Fe Fumarate-FA (MULTIVITAMIN-PRENATAL) 27-0.8 MG TABS tablet, Take 1 tablet by mouth daily at 12 noon., Disp: , Rfl:    Probiotic Product (PHILLIPS COLON HEALTH) CAPS, , Disp: , Rfl:    doxycycline (VIBRA-TABS) 100 MG tablet, Take one tablet twice daily for 5 days. (Patient not taking: Reported on 08/08/2023), Disp: 10 tablet, Rfl: 1   famotidine (PEPCID) 40 MG tablet, Take 1 tablet (40 mg total) by mouth daily. (Patient taking differently: Take 40 mg by mouth as needed.), Disp: 90 tablet, Rfl: 2   methylPREDNISolone (MEDROL DOSEPAK) 4 MG TBPK tablet, Day 1: 8 mg PO before breakfast, 4 mg after lunch and after dinner, and 8 mg at bedtime Day 2: 4  mg PO before breakfast, after lunch, and after dinner and 8 mg at bedtime Day 3: 4 mg PO before breakfast, after lunch, after dinner, and at bedtime Day 4: 4 mg PO before breakfast, after lunch, and at bedtime Day 5: 4 mg PO before breakfast and at bedtime Day 6: 4 mg PO before breakfast (Patient not taking: Reported on 08/08/2023), Disp: 21 each, Rfl: 0   sucralfate (CARAFATE) 1 g tablet, Take 1 tablet (1 g total) by mouth 4 (four) times daily -  with meals and at bedtime., Disp: 120 tablet, Rfl: 0

## 2023-08-08 NOTE — Patient Instructions (Addendum)
Continue steroid taper as planned by the Allergy team  The sooner you get off steroids I think better for your breathing, blood pressure and reflux  Use Atrovent nebulizer as needed to avoid side effects of Xopenex and Budesonide nebs  Continue reflux regimen  Will decrease your budesonide neb dose to 0.25mg   Follow up in 6 months, call sooner if needed

## 2023-08-10 ENCOUNTER — Encounter (HOSPITAL_BASED_OUTPATIENT_CLINIC_OR_DEPARTMENT_OTHER): Payer: Self-pay | Admitting: Emergency Medicine

## 2023-08-10 ENCOUNTER — Other Ambulatory Visit: Payer: Self-pay

## 2023-08-10 ENCOUNTER — Emergency Department (HOSPITAL_BASED_OUTPATIENT_CLINIC_OR_DEPARTMENT_OTHER): Payer: BC Managed Care – PPO

## 2023-08-10 ENCOUNTER — Emergency Department (HOSPITAL_BASED_OUTPATIENT_CLINIC_OR_DEPARTMENT_OTHER)
Admission: EM | Admit: 2023-08-10 | Discharge: 2023-08-11 | Disposition: A | Discharge status: Home / Self Care | Payer: BC Managed Care – PPO | Attending: Emergency Medicine | Admitting: Emergency Medicine

## 2023-08-10 DIAGNOSIS — Z7951 Long term (current) use of inhaled steroids: Secondary | ICD-10-CM | POA: Diagnosis not present

## 2023-08-10 DIAGNOSIS — R079 Chest pain, unspecified: Secondary | ICD-10-CM | POA: Insufficient documentation

## 2023-08-10 DIAGNOSIS — R000 Tachycardia, unspecified: Secondary | ICD-10-CM

## 2023-08-10 DIAGNOSIS — Z79899 Other long term (current) drug therapy: Secondary | ICD-10-CM | POA: Insufficient documentation

## 2023-08-10 DIAGNOSIS — J45909 Unspecified asthma, uncomplicated: Secondary | ICD-10-CM | POA: Diagnosis not present

## 2023-08-10 DIAGNOSIS — I10 Essential (primary) hypertension: Secondary | ICD-10-CM | POA: Insufficient documentation

## 2023-08-10 DIAGNOSIS — D72829 Elevated white blood cell count, unspecified: Secondary | ICD-10-CM | POA: Insufficient documentation

## 2023-08-10 LAB — CBC
HCT: 39.4 % (ref 36.0–46.0)
Hemoglobin: 13.3 g/dL (ref 12.0–15.0)
MCH: 27.6 pg (ref 26.0–34.0)
MCHC: 33.8 g/dL (ref 30.0–36.0)
MCV: 81.7 fL (ref 80.0–100.0)
Platelets: 285 10*3/uL (ref 150–400)
RBC: 4.82 MIL/uL (ref 3.87–5.11)
RDW: 13.8 % (ref 11.5–15.5)
WBC: 11.9 10*3/uL — ABNORMAL HIGH (ref 4.0–10.5)
nRBC: 0 % (ref 0.0–0.2)

## 2023-08-10 LAB — PREGNANCY, URINE: Preg Test, Ur: NEGATIVE

## 2023-08-10 LAB — BASIC METABOLIC PANEL
Anion gap: 10 (ref 5–15)
BUN: 9 mg/dL (ref 6–20)
CO2: 20 mmol/L — ABNORMAL LOW (ref 22–32)
Calcium: 9.1 mg/dL (ref 8.9–10.3)
Chloride: 106 mmol/L (ref 98–111)
Creatinine, Ser: 0.66 mg/dL (ref 0.44–1.00)
GFR, Estimated: >60 mL/min (ref >60)
Glucose, Bld: 102 mg/dL — ABNORMAL HIGH (ref 70–99)
Potassium: 3.5 mmol/L (ref 3.5–5.1)
Sodium: 136 mmol/L (ref 135–145)

## 2023-08-10 LAB — TROPONIN I (HIGH SENSITIVITY): Troponin I (High Sensitivity): 3 ng/L (ref <18)

## 2023-08-10 LAB — BRAIN NATRIURETIC PEPTIDE: B Natriuretic Peptide: 27.6 pg/mL (ref 0.0–100.0)

## 2023-08-10 MED ORDER — SODIUM CHLORIDE 0.9 % IV BOLUS
1000.0000 mL | Freq: Once | INTRAVENOUS | Status: AC
  Administered 2023-08-10: 1000 mL via INTRAVENOUS

## 2023-08-10 NOTE — ED Provider Notes (Signed)
 [facility] Provider Note   CSN: [id number] Arrival date & time: 08/10/23  2130     History  Chief Complaint  Patient presents with   Hypertension    Arvada Seaborn is a 33 y.o. female with past medical history significant for GERD, hypertension, asthma, obesity reports some history of dysautonomia, elevated heart rate with body position movements, who recently finished a course of prednisone just earlier today after severe symptoms related to the flu 2 weeks ago.  Reports elevated blood pressure and heart rate, episode of chest pain while taking a shower.  She does report some resolution after having a large burp, thinks that it could be related to her GERD.  Does endorse increased rest lately with her illness.  No previous history of ACS.   Hypertension       Home Medications Prior to Admission medications   Medication Sig Start Date End Date Taking? Authorizing Provider  Ascorbic Acid (SUPER C COMPLEX PO)  07/19/19   [provider]  budesonide (PULMICORT) 0.25 MG/2ML nebulizer solution Take 2 mLs (0.25 mg total) by nebulization 2 (two) times daily as needed. 08/08/23   Martina Sinner, MD  cetirizine (ZYRTEC) 10 MG tablet Take 10 mg by mouth daily.    [provider]  Cholecalciferol 100 MCG (4000 UT) CAPS Take 1 capsule (4,000 Units total) by mouth daily. 02/06/22   Whitmire, Dawn, FNP  doxycycline (VIBRA-TABS) 100 MG tablet Take one tablet twice daily for 5 days. Patient not taking: Reported on 08/08/2023 07/26/23   Alfonse Spruce, MD  EPINEPHrine 0.3 mg/0.3 mL IJ SOAJ injection  10/07/22   [provider]  famotidine (PEPCID) 40 MG tablet Take 1 tablet (40 mg total) by mouth daily. Patient taking differently: Take 40 mg by mouth as needed. 07/02/22 07/11/23  Alfonse Spruce, MD  ferrous sulfate 325 (65 FE) MG tablet 3 (three) times a week.  08/16/19   [provider]  fluticasone (FLONASE)  50 MCG/ACT nasal spray Place 2 sprays into both nostrils daily. 09/27/22   Verlee Monte, MD  hydrOXYzine (ATARAX) 25 MG tablet Take 1 tablet (25 mg total) by mouth every 6 (six) hours as needed for anxiety (allergies). 05/30/22   Verlee Monte, MD  ipratropium (ATROVENT) 0.02 % nebulizer solution Take 1.25 mLs (0.25 mg total) by nebulization 4 (four) times daily. Mix 0.5 vial in with levalbuterol nebulizer and can use every 6 hours as needed for shortness of breath and wheezing. Patient taking differently: Take 0.25 mg by nebulization as needed. Mix 0.5 vial in with levalbuterol nebulizer and can use every 6 hours as needed for shortness of breath and wheezing. 05/27/22   Verlee Monte, MD  ipratropium (ATROVENT) 0.02 % nebulizer solution Take 2.5 mLs (0.5 mg total) by nebulization 4 (four) times daily. 08/01/23   Peter Garter, PA  levalbuterol [facility] HFA) 45 MCG/ACT inhaler Inhale 2 puffs into the lungs every 6 (six) hours as needed. 06/20/23   Alfonse Spruce, MD  levalbuterol (XOPENEX) 1.25 MG/3ML nebulizer solution USE 1 VIAL BY NEBULIZATION EVERY 6 (SIX) HOURS AS NEEDED FOR WHEEZING. 05/29/22   Verlee Monte, MD  levalbuterol Pauline Aus) 1.25 MG/3ML nebulizer solution Take 1.25 mg by nebulization every 4 (four) hours as needed for wheezing. 08/01/23   Peter Garter, PA  Levonorgestrel-Ethinyl Estradiol (AMETHIA) 0.1-0.02 & 0.01 MG tablet Take 1 tablet by mouth daily.    [provider]  methylPREDNISolone (  MEDROL DOSEPAK) 4 MG TBPK tablet Day 1: 8 mg PO before breakfast, 4 mg after lunch and after dinner, and 8 mg at bedtime Day 2: 4 mg PO before breakfast, after lunch, and after dinner and 8 mg at bedtime Day 3: 4 mg PO before breakfast, after lunch, after dinner, and at bedtime Day 4: 4 mg PO before breakfast, after lunch, and at bedtime Day 5: 4 mg PO before breakfast and at bedtime Day 6: 4 mg PO before breakfast Patient not taking: Reported on 08/08/2023 08/01/23   Sherian Maroon A, PA  metoprolol succinate (TOPROL-XL) 100 MG 24 hr tablet TAKE 1 TABLET BY MOUTH EVERY DAY WITH OR IMMEDIATELY FOLLOWING A MEAL 10/01/22   Loyola Mast, MD  metoprolol tartrate (LOPRESSOR) 50 MG tablet TAKE 1 TABLET (50 MG TOTAL) BY MOUTH AS NEEDED (FOR PALPITATIONS). 07/16/23   Duke Salvia, MD  mometasone (ASMANEX, 120 METERED DOSES,) 220 MCG/ACT inhaler Inhale 2 puffs into the lungs 2 (two) times daily. 08/04/23   Ferol Luz, MD  montelukast (SINGULAIR) 10 MG tablet Take 1 tablet (10 mg total) by mouth daily. 08/04/23   Ferol Luz, MD  ondansetron (ZOFRAN) 4 MG tablet Take 1 tablet (4 mg total) by mouth every 8 (eight) hours as needed for nausea or vomiting. 05/20/22   Verlee Monte, MD  pantoprazole (PROTONIX) 40 MG tablet TAKE 1 TABLET BY MOUTH TWICE A DAY 07/21/23   Alfonse Spruce, MD  potassium chloride SA (KLOR-CON M) 20 MEQ tablet Take 1 tablet (20 mEq total) by mouth daily. 08/01/23   Peter Garter, PA  Prenatal Vit-Fe Fumarate-FA (MULTIVITAMIN-PRENATAL) 27-0.8 MG TABS tablet Take 1 tablet by mouth daily at 12 noon.    [provider]  Probiotic Product (PHILLIPS COLON HEALTH) CAPS  08/16/19   [provider]  sucralfate (CARAFATE) 1 g tablet Take 1 tablet (1 g total) by mouth 4 (four) times daily -  with meals and at bedtime. 06/22/23 07/22/23  Philomena Doheny, MD      Allergies    Budesonide-formoterol fumarate, Other, Penicillins, Raspberry, Spiriva respimat [tiotropium bromide monohydrate], Albuterol, and Losartan    Review of Systems   Review of Systems  All other systems reviewed and are negative.   Physical Exam Updated Vital Signs BP (!) 161/73   Pulse (!) 106   Temp 98.2 F (36.8 C)   Resp 18   Ht 5\' 8"  (1.727 m)   Wt 131.5 kg   LMP 07/26/2023   SpO2 100%   BMI 44.08 kg/m  Physical Exam Vitals and nursing note reviewed.  Constitutional:      General: She is not in acute distress.    Appearance: Normal  appearance.  HENT:     Head: Normocephalic and atraumatic.  Eyes:     General:        Right eye: No discharge.        Left eye: No discharge.  Cardiovascular:     Rate and Rhythm: Regular rhythm. Tachycardia present.     Heart sounds: No murmur heard.    No friction rub. No gallop.  Pulmonary:     Effort: Pulmonary effort is normal.     Breath sounds: Normal breath sounds.     Comments: No wheezing, rhonchi, stridor, rales Abdominal:     General: Bowel sounds are normal.     Palpations: Abdomen is soft.  Musculoskeletal:     Comments: No significant tenderness to palpation of the chest  wall  Skin:    General: Skin is warm and dry.     Capillary Refill: Capillary refill takes less than 2 seconds.  Neurological:     Mental Status: She is alert and oriented to person, place, and time.  Psychiatric:        Mood and Affect: Mood normal.        Behavior: Behavior normal.     ED Results / Procedures / Treatments   Labs (all labs ordered are listed, but only abnormal results are displayed) Labs Reviewed  BASIC METABOLIC PANEL - Abnormal; Notable for the following components:      Result Value   CO2 20 (*)    Glucose, Bld 102 (*)    All other components within normal limits  CBC - Abnormal; Notable for the following components:   WBC 11.9 (*)    All other components within normal limits  PREGNANCY, URINE  BRAIN NATRIURETIC PEPTIDE  TROPONIN I (HIGH SENSITIVITY)  TROPONIN I (HIGH SENSITIVITY)    EKG None  Radiology DG Chest 2 View Result Date: 08/10/2023 CLINICAL DATA:  Chest pain EXAM: CHEST - 2 VIEW COMPARISON:  08/01/2023 FINDINGS: Heart and mediastinal contours are within normal limits. No focal opacities or effusions. No acute bony abnormality. IMPRESSION: No active cardiopulmonary disease. Electronically Signed   By: Charlett Nose M.D.   On: 08/10/2023 22:32    Procedures Procedures    Medications Ordered in ED Medications  sodium chloride 0.9 % bolus 1,000  mL (1,000 mLs Intravenous New Bag/Given 08/10/23 2307)    ED Course/ Medical Decision Making/ A&P                                 Medical Decision Making Amount and/or Complexity of Data Reviewed Labs: ordered. Radiology: ordered.   This patient is a 33 y.o. female  who presents to the ED for concern of chest pain.   Differential diagnoses prior to evaluation: The emergent differential diagnosis includes, but is not limited to,  ACS, AAS, PE, Mallory-Weiss, Boerhaave's, Pneumonia, acute bronchitis, asthma or COPD exacerbation, anxiety, MSK pain or traumatic injury to the chest, acid reflux versus other . This is not an exhaustive differential.   Past Medical History / Co-morbidities / Social History: GERD, hypertension, asthma, obesity   Additional history: Chart reviewed. Pertinent results include: Reviewed lab work, imaging from previous emergency department visits, outpatient pulmonology and cardiology visits/phone messages  Physical Exam: Physical exam performed. The pertinent findings include: Some tachycardia, no significant increased work of breathing, no accessory breath sounds noted.  No tenderness to palpation of chest wall.  Patient otherwise well-appearing, some hypertension, blood pressure 159/100.  Lab Tests/Imaging studies: I personally interpreted labs/imaging and the pertinent results include: BMP overall unremarkable, CBC notable for mild leukocytosis, blood cells 11.9, in context of recent steroid use this is within normal expected value range.  Initial troponin 3, pregnancy test negative, I independently turbid plain film chest x-ray which shows no evidence of acute intrathoracic abnormality. BNP normal. Delta troponin fluids I agree with the radiologist interpretation.  Cardiac monitoring: EKG obtained and interpreted by myself and attending physician which shows: sinus tachycardia, no acute ST-T changes   Medications: I ordered medication including fluids for  tachycardia.  I have reviewed the patients home medicines and have made adjustments as needed.   11:38 PM Care of Retta Wickstrom transferred to Dr. Eudelia Bunch at the end of my shift  as the patient will require reassessment once labs/imaging have resulted. Patient presentation, ED course, and plan of care discussed with review of all pertinent labs and imaging. Please see his/her note for further details regarding further ED course and disposition. Plan at time of handoff is okay for discharge if negative delta troponin and no ongoing chest pain. This may be altered or completely changed at the discretion of the oncoming team pending results of further workup. Final Clinical Impression(s) / ED Diagnoses Final diagnoses:  None    Rx / DC Orders ED Discharge Orders     None         [place] 08/10/23 2339    Linwood Dibbles, MD 08/11/23 Barry Brunner

## 2023-08-10 NOTE — ED Triage Notes (Signed)
 Pt reports she has had elevated BP and HR; just finished 15 days of prednisone; episode of CP while taking a shower tonight

## 2023-08-11 ENCOUNTER — Ambulatory Visit: Payer: BC Managed Care – PPO | Attending: Internal Medicine | Admitting: Internal Medicine

## 2023-08-11 ENCOUNTER — Encounter: Payer: Self-pay | Admitting: Internal Medicine

## 2023-08-11 ENCOUNTER — Telehealth: Payer: Self-pay | Admitting: Student

## 2023-08-11 VITALS — BP 155/81 | HR 102 | Ht 68.0 in | Wt 291.6 lb

## 2023-08-11 DIAGNOSIS — R000 Tachycardia, unspecified: Secondary | ICD-10-CM

## 2023-08-11 DIAGNOSIS — R002 Palpitations: Secondary | ICD-10-CM | POA: Diagnosis not present

## 2023-08-11 DIAGNOSIS — D806 Antibody deficiency with near-normal immunoglobulins or with hyperimmunoglobulinemia: Secondary | ICD-10-CM | POA: Diagnosis not present

## 2023-08-11 LAB — TROPONIN I (HIGH SENSITIVITY): Troponin I (High Sensitivity): 2 ng/L (ref <18)

## 2023-08-11 MED ORDER — METOPROLOL SUCCINATE ER 100 MG PO TB24: ORAL_TABLET | ORAL | 3 refills | Status: DC

## 2023-08-11 MED ORDER — SODIUM CHLORIDE 0.9 % IV BOLUS
500.0000 mL | Freq: Once | INTRAVENOUS | Status: AC
  Administered 2023-08-11: 500 mL via INTRAVENOUS

## 2023-08-11 MED ORDER — METOPROLOL TARTRATE 25 MG PO TABS
50.0000 mg | ORAL_TABLET | Freq: Once | ORAL | Status: AC
  Administered 2023-08-11: 50 mg via ORAL
  Filled 2023-08-11: qty 2

## 2023-08-11 NOTE — ED Provider Notes (Signed)
 I assumed care of this patient from previous provider.  Please see their note for further details of history, exam, and MDM.   Briefly patient is a 33 y.o. female who presented left-sided chest pain, tachycardia.  Recent influenza-like symptoms and asthma exacerbation currently on steroids.  Patient reports a history of dysautonomia/POTS.  Cardiac workup has been reassuring.  Currently awaiting delta troponin and reassessment.  Pain is improved.  She is having intermittent discomfort which resolves after belching.  This is typical of her GERD/gas symptoms which are exacerbated by steroids.  Currently still tachycardic in the low 100s.  With ambulation, patient goes up to the 130s.  Will provide additional IV fluids and as needed dose of metoprolol, 50 mg.  Patient feeling better. Still has mild tachycardia with ambulation, but recovers quickly with rest. Feels well enough to go home. Recommended that she take her PRN metop. Will call cardiologist in the am.  The patient appears reasonably screened and/or stabilized for discharge and I doubt any other medical condition or other [facility] requiring further screening, evaluation, or treatment in the ED at this time. I have discussed the findings, Dx and Tx plan with the patient/family who expressed understanding and agree(s) with the plan. Discharge instructions discussed at length. The patient/family was given strict return precautions who verbalized understanding of the instructions. No further questions at time of discharge.  Disposition: Discharge  Condition: Good  ED Discharge Orders     None         Follow Up: Loyola Mast, MD [address] [place] [address] [phone]  Call    Duke Salvia, MD [address] [place] [address] [phone]  Call        Nira Conn, MD 08/11/23 [phone]

## 2023-08-11 NOTE — Progress Notes (Signed)
 Patient Care Team: Loyola Mast, MD as PCP - General (Family Medicine) Duke Salvia, MD as PCP - Electrophysiology (Cardiology) Duke Salvia, MD as Consulting Physician (Cardiology) Dellis Anes Hetty Ely, MD as Consulting Physician (Allergy and Immunology) Debroah Loop, DO (Otolaryngology) Dohmeier, Porfirio Mylar, MD as Consulting Physician (Neurology) Josephine Igo, DO as Consulting Physician (Pulmonary Disease)   HPI  Nicole Mendoza is a 33 y.o. female seen in follow-up for palpitations potentially related to dysautonomia in the context of hypertension and obesity.  Recent diagnosis of COVID 1/25  Seen in the ER earlier today for chest discomfort associated with negative troponins relief with belching in the context of her GERD exacerbated presumably by her ongoing steroids.  Increased heart rates were noted.  Responded to IV fluids.  So we will have her increase her metoprolol from 100-200 in the morning and 50 at night please  Seen in the ER 2/25 with complaints of shortness of breath in the context of weakness body aches cough and congestion.  Have been diagnosed with influenza 2/11 treated with prednisone and doxycycline and switched to Levaquin.  Noted to have a low potassium was better on followup  Worried about deconditioning -- not able to to ADLs and difficulties again with showers  As needed metoprolol       Date Cr K Hgb  2/25 0.66 3.5 13.3          Records and Results Reviewed   Past Medical History:  Diagnosis Date   Allergic rhinitis    Allergy    Angio-edema    Asthma    Dysmenorrhea    Fatty liver    GERD (gastroesophageal reflux disease)    Hypertension    Recurrent upper respiratory infection (URI)    Sleep apnea    Tachycardia     Past Surgical History:  Procedure Laterality Date   NO PAST SURGERIES      Current Meds  Medication Sig   Ascorbic Acid (SUPER C COMPLEX PO)    budesonide (PULMICORT) 0.25 MG/2ML nebulizer  solution Take 2 mLs (0.25 mg total) by nebulization 2 (two) times daily as needed.   cetirizine (ZYRTEC) 10 MG tablet Take 10 mg by mouth daily.   Cholecalciferol 100 MCG (4000 UT) CAPS Take 1 capsule (4,000 Units total) by mouth daily.   EPINEPHrine 0.3 mg/0.3 mL IJ SOAJ injection    famotidine (PEPCID) 40 MG tablet Take 1 tablet (40 mg total) by mouth daily. (Patient taking differently: Take 40 mg by mouth as needed.)   ferrous sulfate 325 (65 FE) MG tablet 3 (three) times a week.    fluticasone (FLONASE) 50 MCG/ACT nasal spray Place 2 sprays into both nostrils daily.   hydrOXYzine (ATARAX) 25 MG tablet Take 1 tablet (25 mg total) by mouth every 6 (six) hours as needed for anxiety (allergies).   ipratropium (ATROVENT) 0.02 % nebulizer solution Take 1.25 mLs (0.25 mg total) by nebulization 4 (four) times daily. Mix 0.5 vial in with levalbuterol nebulizer and can use every 6 hours as needed for shortness of breath and wheezing. (Patient taking differently: Take 0.25 mg by nebulization as needed. Mix 0.5 vial in with levalbuterol nebulizer and can use every 6 hours as needed for shortness of breath and wheezing.)   ipratropium (ATROVENT) 0.02 % nebulizer solution Take 2.5 mLs (0.5 mg total) by nebulization 4 (four) times daily.   levalbuterol (XOPENEX HFA) 45 MCG/ACT inhaler Inhale 2 puffs into the lungs  every 6 (six) hours as needed.   levalbuterol (XOPENEX) 1.25 MG/3ML nebulizer solution USE 1 VIAL BY NEBULIZATION EVERY 6 (SIX) HOURS AS NEEDED FOR WHEEZING.   Levonorgestrel-Ethinyl Estradiol (AMETHIA) 0.1-0.02 & 0.01 MG tablet Take 1 tablet by mouth daily.   metoprolol succinate (TOPROL-XL) 100 MG 24 hr tablet TAKE 1 TABLET BY MOUTH EVERY DAY WITH OR IMMEDIATELY FOLLOWING A MEAL   metoprolol tartrate (LOPRESSOR) 50 MG tablet TAKE 1 TABLET (50 MG TOTAL) BY MOUTH AS NEEDED (FOR PALPITATIONS).   mometasone (ASMANEX, 120 METERED DOSES,) 220 MCG/ACT inhaler Inhale 2 puffs into the lungs 2 (two) times  daily.   montelukast (SINGULAIR) 10 MG tablet Take 1 tablet (10 mg total) by mouth daily.   ondansetron (ZOFRAN) 4 MG tablet Take 1 tablet (4 mg total) by mouth every 8 (eight) hours as needed for nausea or vomiting.   pantoprazole (PROTONIX) 40 MG tablet TAKE 1 TABLET BY MOUTH TWICE A DAY   potassium chloride SA (KLOR-CON M) 20 MEQ tablet Take 1 tablet (20 mEq total) by mouth daily.   Prenatal Vit-Fe Fumarate-FA (MULTIVITAMIN-PRENATAL) 27-0.8 MG TABS tablet Take 1 tablet by mouth daily at 12 noon.   Probiotic Product (PHILLIPS COLON HEALTH) CAPS     Allergies  Allergen Reactions   Budesonide-Formoterol Fumarate Other (See Comments)    Causes Hypertension   Other     Tree nuts   Penicillins Hives   Raspberry Hives   Spiriva Respimat [Tiotropium Bromide Monohydrate] Other (See Comments)    Pt states medication causes dizziness   Albuterol Palpitations   Losartan Rash      Review of Systems negative except from HPI and PMH  Physical Exam BP (!) 155/81 Comment: standing at 3 mins  Pulse (!) 102   Ht 5\' 8"  (1.727 m)   Wt 291 lb 9.6 oz (132.3 kg)   LMP 07/26/2023   SpO2 99%   BMI 44.34 kg/m  Well developed and Morbidly obese  in no acute distress HENT normal E scleral and icterus clear Neck Supple JVP flat; carotids brisk and full Clear to ausculation Regular rate and rhythm, no murmurs gallops or rub Soft with active bowel sounds No clubbing cyanosis  Edema Alert and oriented, grossly normal motor and sensory function Skin Warm and Dry  ECG sinus @ 98 13/08  Estimated Creatinine Clearance: 145.5 mL/min (by C-G formula based on SCr of 0.66 mg/dL).   Assessment and  Plan Hypertension   Dizziness   Morbid obesity   Exertional tachypalpitations   Sinus tachycardia   GI symptoms question irritable bowel   Orthostatic tachycardia in the setting of post influenza noted in the emergency room.  Orthostatics were normal today.   Tachycardia is problematic.   Right now it seems to have improved with her metoprolol so with her elevated blood pressure we will continue to take it.  We will increase from 100--100/50 for now.  Alternative strategies could include clonidine, ivabradine  Encouraged exercise recumbent    Current medicines are reviewed at length with the patient today .  The patient does not have concerns regarding medicines.

## 2023-08-11 NOTE — Telephone Encounter (Signed)
 Patient's call sent straight to triage. Patient complaining of a racing heart, dizziness, and headache. Patient was in ED last night and was sent home after receiving fluids and metoprolol 50 mg. Patient was instructed to take her metoprolol 100 mg by mouth daily and take her PRN metoprolol 50 mg if her HR is still elevated. Patient stated she needed to see someone today. Called Dr. Odessa Fleming nurse and she agreed to put patient on the schedule for today to see Dr. Graciela Husbands. Made patient's appointment, and she will have her husband bring her in.

## 2023-08-11 NOTE — ED Notes (Signed)
 Pt reports that she was having some chest pain return but it was relieved when she burped a few times.

## 2023-08-11 NOTE — ED Notes (Signed)
 Pt ambulated to the bathroom w/ RN standby assist.  She stated that she felt a little light headed but gait was stable.  Once pt was placed back in the bed her heart rate registered 130bpm.  Provider made aware.

## 2023-08-11 NOTE — ED Notes (Signed)
 Pt walked to the bathroom and back.  Upon returning she reports she was feeling much better.  Initial hr was 111 but quickly recovered to 95bpm.

## 2023-08-11 NOTE — Patient Instructions (Signed)
 Medication Instructions:  Your physician has recommended you make the following change in your medication:   Increase Metoprolol Succinate to 1 tablet (100mg )by mouth in the morning and 1/2 tablet (50mg ) at night  *If you need a refill on your cardiac medications before your next appointment, please call your pharmacy*   Lab Work: None ordered.  If you have labs (blood work) drawn today and your tests are completely normal, you will receive your results only by: MyChart Message (if you have MyChart) OR A paper copy in the mail If you have any lab test that is abnormal or we need to change your treatment, we will call you to review the results.   Testing/Procedures: None ordered.    Follow-Up: At [facility], you and your health needs are our priority.  As part of our continuing mission to provide you with exceptional heart care, we have created designated Provider Care Teams.  These Care Teams include your primary Cardiologist (physician) and Advanced Practice Providers (APPs -  Physician Assistants and Nurse Practitioners) who all work together to provide you with the care you need, when you need it.  We recommend signing up for the patient portal called "MyChart".  Sign up information is provided on this After Visit Summary.  MyChart is used to connect with patients for Virtual Visits (Telemedicine).  Patients are able to view lab/test results, encounter notes, upcoming appointments, etc.  Non-urgent messages can be sent to your provider as well.   To learn more about what you can do with MyChart, go to [email or web address].    Your next appointment:   Follow up as scheduled

## 2023-08-11 NOTE — Telephone Encounter (Signed)
 STAT if HR is under 50 or over 120 (normal HR is 60-100 beats per minute)  What is your heart rate? 130 (in ER); 110 (not active); 140 (active)  Do you have a log of your heart rate readings (document readings)? Yes   Do you have any other symptoms? Dizziness, headache

## 2023-08-12 ENCOUNTER — Encounter: Payer: Self-pay | Admitting: Allergy & Immunology

## 2023-08-12 ENCOUNTER — Ambulatory Visit (INDEPENDENT_AMBULATORY_CARE_PROVIDER_SITE_OTHER): Payer: BC Managed Care – PPO | Admitting: Allergy & Immunology

## 2023-08-12 ENCOUNTER — Other Ambulatory Visit: Payer: Self-pay

## 2023-08-12 DIAGNOSIS — Z630 Problems in relationship with spouse or partner: Secondary | ICD-10-CM | POA: Diagnosis not present

## 2023-08-12 DIAGNOSIS — J383 Other diseases of vocal cords: Secondary | ICD-10-CM | POA: Diagnosis not present

## 2023-08-12 DIAGNOSIS — R053 Chronic cough: Secondary | ICD-10-CM

## 2023-08-12 DIAGNOSIS — J329 Chronic sinusitis, unspecified: Secondary | ICD-10-CM

## 2023-08-12 DIAGNOSIS — J4540 Moderate persistent asthma, uncomplicated: Secondary | ICD-10-CM | POA: Diagnosis not present

## 2023-08-12 DIAGNOSIS — J3089 Other allergic rhinitis: Secondary | ICD-10-CM

## 2023-08-12 DIAGNOSIS — F419 Anxiety disorder, unspecified: Secondary | ICD-10-CM | POA: Diagnosis not present

## 2023-08-12 DIAGNOSIS — U099 Post covid-19 condition, unspecified: Secondary | ICD-10-CM

## 2023-08-12 DIAGNOSIS — J302 Other seasonal allergic rhinitis: Secondary | ICD-10-CM

## 2023-08-12 DIAGNOSIS — D806 Antibody deficiency with near-normal immunoglobulins or with hyperimmunoglobulinemia: Secondary | ICD-10-CM

## 2023-08-12 DIAGNOSIS — K219 Gastro-esophageal reflux disease without esophagitis: Secondary | ICD-10-CM

## 2023-08-12 MED ORDER — DEXLANSOPRAZOLE 60 MG PO CPDR: 60.0000 mg | DELAYED_RELEASE_CAPSULE | Freq: Every day | ORAL | 3 refills | Status: DC

## 2023-08-12 NOTE — Patient Instructions (Addendum)
 Asthma - Lung testing looks AWESOME SAUCE!  - Consider restarting Harrington Challenger, once you are in a better spot.  - Daily controller medication(s): Singulair 10mg  daily and Asmanex 2 puffs twice daily  - Prior to physical activity: Xopenex 2 puffs 10-15 minutes before physical activity. - Rescue medications: Xopenex 4 puffs every 4-6 hours as needed - Changes during respiratory infections or worsening symptoms: Add on Pulmicort 0.25mg  to one treatment twice daily for TWO WEEKS. - Asthma control goals:  * Full participation in all desired activities (may need albuterol before activity) * Albuterol use two time or less a week on average (not counting use with activity) * Cough interfering with sleep two time or less a month * Oral steroids no more than once a year * No hospitalizations  Allergic rhinitis - Continue saline rinses several times a day - Continue cetirizine 10 mg once a day as needed for a runny nose or itch. - Continue Flonase 2 sprays in each nostril twice a day for nasal symptoms - Continue with Astelin two sprays per nostril twice daily in case this is related to postnasal drip.   Recurrent infections - We will continue with the Privagen every three weeks. - We can consider changing to Asceniv if this does not do the trick ([email or web address]). - I sent the results of your testing to Dr. Allena Katz to see what he thinks.   Reflux - Continue dietary and lifestyle modifications as listed below - We will try to get Dexilant approved again with your insurance. t - Continue with famotidine 40mg  nightly. - Continue with Carafate as needed.   Vocal cord dysfunction - Continue with speech therapy at home lessons as you are doing.  - Continue with the belly breathing exercises.  Food allergy - Continue to avoid tree nuts.  In case of an allergic reaction, take Benadryl 50 mg every 4 hours, and if life-threatening symptoms occur, inject with AuviQ 0.3 mg. - We will get that mixed  tree nut challenge done at some point.   Return in about 3 months (around 11/09/2023). You can have the follow up appointment with Dr. Dellis Anes or a Nurse Practicioner (our Nurse Practitioners are excellent and always have Physician oversight!).    Please inform us of any Emergency Department visits, hospitalizations, or changes in symptoms. Call us before going to the ED for breathing or allergy symptoms since we might be able to fit you in for a sick visit. Feel free to contact us anytime with any questions, problems, or concerns.  It was a pleasure to see you again today!  Websites that have reliable patient information: 1. American Academy of Asthma, Allergy, and Immunology: www.aaaai.org 2. Food Allergy Research and Education (FARE): foodallergy.org 3. Mothers of Asthmatics: http://www.asthmacommunitynetwork.org 4. American College of Allergy, Asthma, and Immunology: www.acaai.org      "Like" [place] on Facebook and Instagram for our latest updates!      A healthy democracy works best when [employer] participate! Make sure you are registered to vote! If you have moved or changed any of your contact information, you will need to get this updated before voting! Scan the QR codes below to learn more!

## 2023-08-12 NOTE — Progress Notes (Signed)
 FOLLOW UP  Date of Service/Encounter:  08/12/23   Assessment:   Mild persistent asthma, uncomplicated - well controlled at this point in time   Vocal cord dysfunction - followed by Dr. Rubye Oaks at [facility]   Recurrent infections - improved following Pneumovax, but then worsened (streptococcal avidity assay abnormal) - on Privigen with a decrease in frequency of infections, although we are going to try to increase frequency to every 3 weeks   Seasonal and perennial allergic rhinitis (trees, weeds, grasses and dust mites) - not well controlled    Gastroesophageal reflux disease - on Dexilant and famotidine and PRN carafate    Adverse food reactions - resolved with minimizing exposure to food additives and avoiding peanuts/tree nuts   Snoring with perceived poor sleep quality - improved with use of the CPAP   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  I do think she might benefit from trialing Harrington Challenger again to see if this would help.  She did feel better when she was on it, although she had a 1 to 2-day course of cold-like illness following each injection when we had her on it a few years ago.  I am not sure about her NOD2 mutation.  This mutation is seen in Blau syndrome when she really does not fit that diagnosis.  She does have inflammation and joint pain, but she does not have any rash or uveitis.  Therefore I am not sure that this is really relevant. We will strongly consider changing to Asceniv and consider adding on Fasenra back onto her regimen.   Plan/Recommendations:   Asthma - Lung testing not done today.  - Consider restarting Harrington Challenger, once you are in a better spot.  - Daily controller medication(s): Singulair 10mg  daily and Asmanex 2  puffs twice daily  - Prior to physical activity: Xopenex 2 puffs 10-15 minutes before physical activity. - Rescue medications: Xopenex 4 puffs every 4-6 hours as needed - Changes during respiratory infections or worsening symptoms: Add on Pulmicort 0.25mg  to one treatment twice daily for TWO WEEKS. - Asthma control goals:  * Full participation in all desired activities (may need albuterol before activity) * Albuterol use two time or less a week on average (not counting use with activity) * Cough interfering with sleep two time or less a month * Oral steroids no more than once a year * No hospitalizations  Allergic rhinitis - Continue saline rinses several times a day - Continue cetirizine 10 mg once a day as needed for a runny nose or itch. - Continue Flonase 2 sprays in each nostril twice a day for nasal symptoms - Continue with Astelin two sprays per nostril twice daily in case this is related to postnasal drip.   Recurrent infections - We will continue with the Privagen every three weeks. - We can consider changing to Asceniv if this does not do the trick ([email or web address]). - I sent the results of your testing to Dr. Allena Katz to see what he thinks.   Reflux - Continue dietary and lifestyle modifications as listed below - We will try to get Dexilant approved again with your insurance. t - Continue with famotidine 40mg  nightly. - Continue with Carafate as needed.   Vocal cord dysfunction - Continue with speech therapy at home lessons as you are doing.  -  Continue with the belly breathing exercises.  Food allergy - Continue to avoid tree nuts.  In case of an allergic reaction, take Benadryl 50 mg every 4 hours, and if life-threatening symptoms occur, inject with AuviQ 0.3 mg. - We will get that mixed tree nut challenge done at some point.   Return in about 3 months (around 11/09/2023). You can have the follow up appointment with Dr. Dellis Anes or a Nurse Practicioner (our  Nurse Practitioners are excellent and always have Physician oversight!).   Subjective:   Nicole Mendoza is a 33 y.o. female presenting today for follow up of  Chief Complaint  Patient presents with   Immune issues    Nicole Mendoza has a history of the following: Patient Active Problem List   Diagnosis Date Noted   COVID-19 06/25/2023   Acute non-recurrent maxillary sinusitis 06/19/2023   Elevated TSH 06/19/2023   NAFLD (nonalcoholic fatty liver disease) 16/03/9603   Other fatigue 11/25/2022   Prediabetes 10/15/2022   Eating disorder 10/15/2022   BMI 45.0-49.9, adult (HCC) Current BMI 45.2 10/15/2022   Adjustment disorder with anxiety 06/12/2022   Vocal cord dysfunction 06/03/2022   Positive ANA (antinuclear antibody) 07/13/2021   Fibromyalgia 07/13/2021   Dysfunction of left eustachian tube 06/26/2021   Vertigo 06/26/2021   Obstructive sleep apnea 02/26/2021   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   Recurrent infections 12/20/2020   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 11/22/2020   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   Recurrent sinusitis 11/09/2020   Pre-nodular edema of the vocal folds 11/09/2020   ASCUS of cervix with negative high risk HPV 07/10/2020   Cardiac murmur 09/02/2019   Tachycardia 09/01/2019   Salivary stone 09/01/2019   Anaphylactic shock due to adverse food reaction 01/22/2019   Seasonal and perennial allergic rhinitis 01/22/2019   Morbid obesity (HCC) 10/28/2017   Moderate persistent asthma without complication 07/29/2017   Essential hypertension 04/22/2017   Dysmenorrhea 06/26/2016   Menorrhagia with regular cycle 06/26/2016   Gastroesophageal reflux disease 12/22/2015   Vitamin D deficiency 11/02/2014    History obtained from: chart review and patient.  Discussed the use of AI scribe software for clinical note  transcription with the patient and/or guardian, who gave verbal consent to proceed.  [name] is a 33 y.o. female presenting for a follow up visit.  She was last seen on February 17.  At that time, her lung sounded clear.  She was increased Asmanex 4 puffs twice daily for 2 weeks and started on a prednisone taper.  She was continued on Singulair 10 mg daily and Xopenex as needed.  She was continuing on her immunoglobulin replacement.  In the interim, she was diagnosed with the flu in early February.  She declined Tamiflu.  This seems to have set her down a pretty terrible course has been ongoing for about 3 weeks.  Prior to this, she did see Dr. Thalia Party on January 30.  At that time, he recommended changing Privigen to every 3 weeks.  He also recommended doing a penicillin challenge to rule out a penicillin allergy and allow her to tolerate penicillins in the future.  In the interim, she has had a couple of emergency room visits for shortness of breath and tachycardia.  In February 14, she had a chest x-ray which was normal.  She received some potassium repletion as well as normal  saline boluses.  The D-dimer was normal.  She did get a Xopenex treatment in the ER.  She has another ER visit on February 23.  She had labs which were largely normal.  The CBC did show an elevated white blood count, although she was on prednisone at the time.  Troponin was normal.  A chest x-ray was unchanged.  She has had three emergency room visits this year and expresses concern about her frequent need for medical care. She is currently on short-term disability, which will begin after this week, as she has exhausted her paid time off.  She experiences dizziness and a sensation of her legs feeling weak, as if she might 'crumble' beneath her, particularly when standing up after resting. There is a feeling of potential fainting, and these symptoms occur even after minimal activity, such as getting dressed or brushing her hair.  She also experiences shortness of breath, described as feeling like 'breathing through a straw'.  She recently had the flu about a week and a half ago, which she feels she has not fully recovered from. She mentions that she was able to resume normal activities after taking Paxlovid for COVID-19, but the flu has left her feeling unable to 'get over the hump'. She has been out of work for the past week due to these symptoms.  Asthma/Respiratory Symptom History: She has a history of asthma, but notes that her asthma does not typically interfere with her daily activities when she is not sick. She uses Asmanex, two vials twice a day, regularly. She has also been advised to use Atrovent if needed, although she reports feeling more 'wound up' when using it with Xopenex. She has tried Xopenex without relief, and her symptoms persist despite normal oxygen saturation levels. She has previously used VCD exercises with variable success. She has an appointment scheduled with Dr. Rubye Oaks in the near future. She has been on Fasenra in the past and actually felt fairly good (after 48 hours of feeling as if she had a cold following each injection).   She did see Dr. Francine Graven, her pulmonologist, on February 21.  At that time, she was continued on prednisone 10 mg daily.  Lungs sounded clear.  Peak flow was excellent.  He recommended tapering to 5 mg for 3 days and then stopping.  He recommended using budesonide 0.25 mg as needed with Atrovent nebulizers for rescue.  Her blood pressure was elevated which was felt to be related to the prednisone.  It was felt that she was deconditioned.  She was encouraged to increase activity as tolerated including walking around the home in the neighborhood.  She did see Dr. Graciela Husbands with cardiology on February 24.  At that time, her metoprolol was increased to 100 mg in the morning and 50 mg at night.  She has a history of fibromyalgia, which she believes has flared recently, as she experiences  burning sensations in her legs and muscles. Prednisone has been effective in managing her fibromyalgia symptoms, but she has not taken it recently due to adverse effects. Her fibromyalgia symptoms have worsened since stopping prednisone after a 16-day course.  Her Privigen infusions are going okay.  We did increase the frequency to every 3 weeks, but unfortunately her nurse was not available for that.  Therefore, she is getting it every 4 weeks and hopefully next time can increase to every 3 weeks.  We did discuss changing for Asceniv today and we discussed how it was different than Privigen in  composition.  She did have her genetic testing done and that showed an increased risk allele in the NOD2 gene.    She is also managing guardianship responsibilities for several children, which adds to her stress.   Otherwise, there have been no changes to her past medical history, surgical history, family history, or social history.    Review of systems otherwise negative other than that mentioned in the HPI.    Objective:   Last menstrual period 07/26/2023. There is no height or weight on file to calculate BMI.    Physical Exam Vitals reviewed.  Constitutional:      Appearance: She is well-developed. She is obese.     Comments: Very talkative.  Not ill-appearing.   HENT:     Head: Normocephalic and atraumatic.     Right Ear: Tympanic membrane, ear canal and external ear normal.     Left Ear: Tympanic membrane, ear canal and external ear normal.     Nose: Rhinorrhea present. No nasal deformity, septal deviation or mucosal edema.     Right Turbinates: Enlarged, swollen and pale.     Left Turbinates: Enlarged, swollen and pale.     Right Sinus: No maxillary sinus tenderness or frontal sinus tenderness.     Left Sinus: No maxillary sinus tenderness or frontal sinus tenderness.     Comments: No nasal polyps. Scant clear rhinorrhea.     Mouth/Throat:     Lips: Pink.     Mouth: Mucous  membranes are not pale and not dry.     Pharynx: Uvula midline. No pharyngeal swelling or oropharyngeal exudate.     Tonsils: 2+ on the right. 2+ on the left.     Comments: Cobblestoning present in the posterior oropharynx.  Eyes:     General: Lids are normal. Allergic shiner present.        Right eye: No discharge.        Left eye: No discharge.     Conjunctiva/sclera:     Right eye: Right conjunctiva is not injected. No chemosis.    Left eye: Left conjunctiva is not injected. No chemosis.    Pupils: Pupils are equal, round, and reactive to light.  Cardiovascular:     Rate and Rhythm: Normal rate and regular rhythm.     Heart sounds: Normal heart sounds.  Pulmonary:     Effort: Pulmonary effort is normal. No tachypnea, accessory muscle usage or respiratory distress.     Breath sounds: Normal breath sounds. No wheezing, rhonchi or rales.     Comments: Moving air well in all lung fields.  No increased work of breathing. Chest:     Chest wall: No tenderness.  Lymphadenopathy:     Cervical: No cervical adenopathy.  Skin:    General: Skin is warm.     Capillary Refill: Capillary refill takes less than 2 seconds.     Coloration: Skin is not pale.     Findings: No abrasion, erythema, petechiae or rash. Rash is not papular, urticarial or vesicular.     Comments: No eczematous or urticarial lesions noted.  Neurological:     Mental Status: She is alert.  Psychiatric:        Behavior: Behavior is cooperative.      Diagnostic studies: none      Malachi Bonds, MD  [facility]

## 2023-08-13 ENCOUNTER — Other Ambulatory Visit: Payer: Self-pay

## 2023-08-13 ENCOUNTER — Ambulatory Visit (INDEPENDENT_AMBULATORY_CARE_PROVIDER_SITE_OTHER): Payer: BC Managed Care – PPO | Admitting: Family Medicine

## 2023-08-13 VITALS — BP 143/76 | HR 98 | Temp 98.4°F | Resp 18 | Wt 293.2 lb

## 2023-08-13 DIAGNOSIS — R000 Tachycardia, unspecified: Secondary | ICD-10-CM | POA: Diagnosis not present

## 2023-08-13 DIAGNOSIS — J4540 Moderate persistent asthma, uncomplicated: Secondary | ICD-10-CM | POA: Diagnosis not present

## 2023-08-13 DIAGNOSIS — G9331 Postviral fatigue syndrome: Secondary | ICD-10-CM | POA: Diagnosis not present

## 2023-08-13 DIAGNOSIS — I10 Essential (primary) hypertension: Secondary | ICD-10-CM

## 2023-08-13 MED ORDER — XHANCE 93 MCG/ACT NA EXHU: 2.0000 | INHALANT_SUSPENSION | Freq: Two times a day (BID) | NASAL | 11 refills | Status: DC | PRN

## 2023-08-13 NOTE — Assessment & Plan Note (Signed)
 Nicole Mendoza continues to have fatigue associated with having had recent COVID and influenza A. Recommend she rest as needed, hydrate well, and we will follow for now.

## 2023-08-13 NOTE — Progress Notes (Deleted)
 [facility] [address] [place] [address] Dept: [phone] Dept Fax: [phone]  Chronic Care Office Visit  Subjective:    Patient ID: Nicole Mendoza, female    DOB: 19-Jan-1991, 33 y.o..   MRN: [id number]  Chief Complaint  Patient presents with   OFFICE VISIT     1 month follow up. Pt was seen in the ED for tachycardia on 08/10/2023,with ongoing sinus issues after positive COVID testing. Pt is currently seeing a Cardiologist    History of Present Illness:  Patient is in today for reassessment of chronic medical issues.  Past Medical History: Patient Active Problem List   Diagnosis Date Noted   COVID-19 06/25/2023   Acute non-recurrent maxillary sinusitis 06/19/2023   Elevated TSH 06/19/2023   NAFLD (nonalcoholic fatty liver disease) 38/75/6433   Other fatigue 11/25/2022   Prediabetes 10/15/2022   Eating disorder 10/15/2022   BMI 45.0-49.9, adult (HCC) Current BMI 45.2 10/15/2022   Adjustment disorder with anxiety 06/12/2022   Vocal cord dysfunction 06/03/2022   Positive ANA (antinuclear antibody) 07/13/2021   Fibromyalgia 07/13/2021   Dysfunction of left eustachian tube 06/26/2021   Vertigo 06/26/2021   Obstructive sleep apnea 02/26/2021   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   Recurrent infections 12/20/2020   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 11/22/2020   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   Recurrent sinusitis 11/09/2020   Pre-nodular edema of the vocal folds 11/09/2020   ASCUS of cervix with negative high risk HPV 07/10/2020   Cardiac murmur 09/02/2019   Tachycardia 09/01/2019   Salivary stone 09/01/2019   Anaphylactic shock due to adverse food reaction 01/22/2019   Seasonal and perennial allergic rhinitis 01/22/2019   Morbid obesity (HCC) 10/28/2017    Moderate persistent asthma without complication 07/29/2017   Essential hypertension 04/22/2017   Dysmenorrhea 06/26/2016   Menorrhagia with regular cycle 06/26/2016   Gastroesophageal reflux disease 12/22/2015   Vitamin D deficiency 11/02/2014   Past Surgical History:  Procedure Laterality Date   NO PAST SURGERIES     Family History  Adopted: Yes  Problem Relation Age of Onset   Asthma Mother    Diabetes Mother    High blood pressure Mother    Heart disease Mother    Bipolar disorder Mother    Sleep apnea Mother    Obesity Mother    Allergic rhinitis Father    Asthma Father    Diabetes Father    High blood pressure Father    Hyperlipidemia Father    Obesity Father    Allergic rhinitis Sister    Asthma Sister    Angioedema Neg Hx    Atopy Neg Hx    Eczema Neg Hx    Immunodeficiency Neg Hx    Urticaria Neg Hx    Outpatient Medications Prior to Visit  Medication Sig Dispense Refill   Ascorbic Acid (SUPER C COMPLEX PO)      azelastine (ASTELIN) 0.1 % nasal spray PLACE 2 SPRAYS INTO BOTH NOSTRILS 2 (TWO) TIMES DAILY. USE IN EACH NOSTRIL AS DIRECTED     budesonide (PULMICORT) 0.25 MG/2ML nebulizer solution Take 2 mLs (0.25 mg total) by nebulization 2 (two) times daily as needed. 120 mL 12   cetirizine (ZYRTEC) 10 MG tablet Take 10 mg by mouth daily.     Cholecalciferol 100 MCG (4000 UT) CAPS Take 1 capsule (  4,000 Units total) by mouth daily. 30 capsule 0   dexlansoprazole (DEXILANT) 60 MG capsule Take 1 capsule (60 mg total) by mouth daily. 90 capsule 3   EPINEPHrine 0.3 mg/0.3 mL IJ SOAJ injection      ferrous sulfate 325 (65 FE) MG tablet 3 (three) times a week.      fluticasone (FLONASE) 50 MCG/ACT nasal spray Place 2 sprays into both nostrils daily. 16 g 5   hydrOXYzine (ATARAX) 25 MG tablet Take 1 tablet (25 mg total) by mouth every 6 (six) hours as needed for anxiety (allergies). 30 tablet 1   ipratropium (ATROVENT) 0.02 % nebulizer solution Take 1.25 mLs (0.25 mg  total) by nebulization 4 (four) times daily. Mix 0.5 vial in with levalbuterol nebulizer and can use every 6 hours as needed for shortness of breath and wheezing. (Patient taking differently: Take 0.25 mg by nebulization as needed. Mix 0.5 vial in with levalbuterol nebulizer and can use every 6 hours as needed for shortness of breath and wheezing.) 75 mL 3   ipratropium (ATROVENT) 0.02 % nebulizer solution Take 2.5 mLs (0.5 mg total) by nebulization 4 (four) times daily. 75 mL 12   levalbuterol (XOPENEX HFA) 45 MCG/ACT inhaler Inhale 2 puffs into the lungs every 6 (six) hours as needed. 15 g 0   levalbuterol (XOPENEX) 1.25 MG/3ML nebulizer solution USE 1 VIAL BY NEBULIZATION EVERY 6 (SIX) HOURS AS NEEDED FOR WHEEZING. 540 mL 1   Levonorgestrel-Ethinyl Estradiol (AMETHIA) 0.1-0.02 & 0.01 MG tablet Take 1 tablet by mouth daily.     metoprolol succinate (TOPROL-XL) 100 MG 24 hr tablet Take 1 tablet (100mg ) by mouth in the morning and 1/2 tablet (50mg ) in the evening 135 tablet 3   metoprolol tartrate (LOPRESSOR) 50 MG tablet TAKE 1 TABLET (50 MG TOTAL) BY MOUTH AS NEEDED (FOR PALPITATIONS). 90 tablet 2   mometasone (ASMANEX, 120 METERED DOSES,) 220 MCG/ACT inhaler Inhale 2 puffs into the lungs 2 (two) times daily. 1 each 5   montelukast (SINGULAIR) 10 MG tablet Take 1 tablet (10 mg total) by mouth daily. 90 tablet 1   ondansetron (ZOFRAN) 4 MG tablet Take 1 tablet (4 mg total) by mouth every 8 (eight) hours as needed for nausea or vomiting. 20 tablet 0   pantoprazole (PROTONIX) 40 MG tablet TAKE 1 TABLET BY MOUTH TWICE A DAY 180 tablet 1   potassium chloride SA (KLOR-CON M) 20 MEQ tablet Take 1 tablet (20 mEq total) by mouth daily. 3 tablet 0   Prenatal Vit-Fe Fumarate-FA (MULTIVITAMIN-PRENATAL) 27-0.8 MG TABS tablet Take 1 tablet by mouth daily at 12 noon.     Probiotic Product (PHILLIPS COLON HEALTH) CAPS      famotidine (PEPCID) 40 MG tablet Take 1 tablet (40 mg total) by mouth daily. (Patient taking  differently: Take 40 mg by mouth as needed.) 90 tablet 2   sucralfate (CARAFATE) 1 g tablet Take 1 tablet (1 g total) by mouth 4 (four) times daily -  with meals and at bedtime. 120 tablet 0   No facility-administered medications prior to visit.   Allergies  Allergen Reactions   Budesonide-Formoterol Fumarate Other (See Comments)    Causes Hypertension  Causes Hypertension    Made blood pressure go up   Other     Tree nuts   Penicillins Hives   Raspberry Hives   Spiriva Respimat [Tiotropium Bromide Monohydrate] Other (See Comments)    Pt states medication causes dizziness   Albuterol Palpitations   Losartan Rash  Objective:   Today's Vitals   08/13/23 1018 08/13/23 1021  BP: (!) 150/95 (!) 143/76  Pulse: 98   Resp: 18   Temp: 98.4 F (36.9 C)   TempSrc: Temporal   SpO2: 99%   Weight: 293 lb 3.2 oz (133 kg)   PainSc: 0-No pain    Body mass index is 44.58 kg/m.   General: Well developed, well nourished. No acute distress. HEENT: Normocephalic, non-traumatic. External ears normal. EAC and TMs normal bilaterally. PERRL, EOMI. Conjunctiva clear. Nose   clear without congestion or rhinorrhea. Mucous membranes moist. Oropharynx clear. Good dentition. Neck: Supple. No lymphadenopathy. No thyromegaly. Lungs: Clear to auscultation bilaterally. No wheezing, rales or rhonchi. CV: RRR without murmurs or rubs. Pulses 2+ bilaterally. Abdomen: Soft, non-tender. Bowel sounds positive, normal pitch and frequency. No hepatosplenomegaly. No rebound or guarding. Back: Straight. No CVA tenderness bilaterally. Extremities: Full ROM. No joint swelling or tenderness. No edema noted. Skin: Warm and dry. No rashes. Neuro: CN II-XII intact. Normal sensation and DTR bilaterally. Psych: Alert and oriented. Normal mood and affect.  Health Maintenance Due  Topic Date Due   HIV Screening  Never done   Hepatitis C Screening  Never done   Pneumococcal Vaccine 24-98 Years old (2 of 2 - PCV)  05/13/2019    Lab Results {Labs (Optional):29002}    Assessment & Plan:   Problem List Items Addressed This Visit       Cardiovascular and Mediastinum   Essential hypertension - Primary     Respiratory   Moderate persistent asthma without complication     Other   Tachycardia   Other Visit Diagnoses       Postviral fatigue syndrome           Return in about 2 months (around 10/11/2023) for Reassessment.   Loyola Mast, MD

## 2023-08-13 NOTE — Assessment & Plan Note (Signed)
 Possible POTS. Continue working with cardiology. Continue metoprolol 100/50 regimen.

## 2023-08-13 NOTE — Progress Notes (Signed)
 [facility] [address] [place] [address] Dept: [phone] Dept Fax: [phone]  Office Visit  Subjective:    Patient ID: Nicole Mendoza, female    DOB: [birth year], 33 y.o..   MRN: [id number]  Chief Complaint  Patient presents with   OFFICE VISIT     1 month follow up. Pt was seen in the ED for tachycardia on 08/10/2023,with ongoing sinus issues after positive COVID testing. Pt is currently seeing a Cardiologist    History of Present Illness:  Patient is in today for re-evaluation of her recent illnesses. Ms. Arizmendi had COVID in early January. Two weeks ago, she developed Influenza A. All of this has had a significant impact on her autonomic dysregulation. She has had issues with tachycardia and breathing difficulty. She did see her cardiologist and her allergist earlier this week. Her asthma has seemed to remain in good control. Her cardiologist added metoprolol tartrate 50 mg at night. Ms. Iacovelli has found that she cannot tolerate being up and around too much, as she fatigues easily and has surges of her tachycardia.  Past Medical History: Patient Active Problem List   Diagnosis Date Noted   COVID-19 06/25/2023   Acute non-recurrent maxillary sinusitis 06/19/2023   Elevated TSH 06/19/2023   NAFLD (nonalcoholic fatty liver disease) 47/42/5956   Other fatigue 11/25/2022   Prediabetes 10/15/2022   Eating disorder 10/15/2022   BMI 45.0-49.9, adult (HCC) Current BMI 45.2 10/15/2022   Adjustment disorder with anxiety 06/12/2022   Vocal cord dysfunction 06/03/2022   Positive ANA (antinuclear antibody) 07/13/2021   Fibromyalgia 07/13/2021   Dysfunction of left eustachian tube 06/26/2021   Vertigo 06/26/2021   Obstructive sleep apnea 02/26/2021   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   Recurrent infections 12/20/2020   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 11/22/2020   Specific antibody deficiency with normal  IG concentration and normal number of B cells (HCC) 11/19/2020   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   Recurrent sinusitis 11/09/2020   Pre-nodular edema of the vocal folds 11/09/2020   ASCUS of cervix with negative high risk HPV 07/10/2020   Cardiac murmur 09/02/2019   Tachycardia 09/01/2019   Salivary stone 09/01/2019   Anaphylactic shock due to adverse food reaction 01/22/2019   Seasonal and perennial allergic rhinitis 01/22/2019   Morbid obesity (HCC) 10/28/2017   Moderate persistent asthma without complication 07/29/2017   Essential hypertension 04/22/2017   Dysmenorrhea 06/26/2016   Menorrhagia with regular cycle 06/26/2016   Gastroesophageal reflux disease 12/22/2015   Vitamin D deficiency 11/02/2014   Past Surgical History:  Procedure Laterality Date   NO PAST SURGERIES     Family History  Adopted: Yes  Problem Relation Age of Onset   Asthma Mother    Diabetes Mother    High blood pressure Mother    Heart disease Mother    Bipolar disorder Mother    Sleep apnea Mother    Obesity Mother    Allergic rhinitis Father    Asthma Father    Diabetes Father    High blood pressure Father    Hyperlipidemia Father    Obesity Father    Allergic rhinitis Sister    Asthma Sister    Angioedema Neg Hx    Atopy Neg Hx    Eczema Neg Hx    Immunodeficiency Neg Hx    Urticaria Neg Hx    Outpatient Medications Prior to Visit  Medication Sig Dispense Refill  Ascorbic Acid (SUPER C COMPLEX PO)      azelastine (ASTELIN) 0.1 % nasal spray PLACE 2 SPRAYS INTO BOTH NOSTRILS 2 (TWO) TIMES DAILY. USE IN EACH NOSTRIL AS DIRECTED     budesonide (PULMICORT) 0.25 MG/2ML nebulizer solution Take 2 mLs (0.25 mg total) by nebulization 2 (two) times daily as needed. 120 mL 12   cetirizine (ZYRTEC) 10 MG tablet Take 10 mg by mouth daily.     Cholecalciferol 100 MCG (4000 UT) CAPS Take 1 capsule (4,000 Units total) by mouth daily. 30 capsule 0   dexlansoprazole (DEXILANT) 60 MG  capsule Take 1 capsule (60 mg total) by mouth daily. 90 capsule 3   EPINEPHrine 0.3 mg/0.3 mL IJ SOAJ injection      ferrous sulfate 325 (65 FE) MG tablet 3 (three) times a week.      fluticasone (FLONASE) 50 MCG/ACT nasal spray Place 2 sprays into both nostrils daily. 16 g 5   hydrOXYzine (ATARAX) 25 MG tablet Take 1 tablet (25 mg total) by mouth every 6 (six) hours as needed for anxiety (allergies). 30 tablet 1   ipratropium (ATROVENT) 0.02 % nebulizer solution Take 1.25 mLs (0.25 mg total) by nebulization 4 (four) times daily. Mix 0.5 vial in with levalbuterol nebulizer and can use every 6 hours as needed for shortness of breath and wheezing. (Patient taking differently: Take 0.25 mg by nebulization as needed. Mix 0.5 vial in with levalbuterol nebulizer and can use every 6 hours as needed for shortness of breath and wheezing.) 75 mL 3   ipratropium (ATROVENT) 0.02 % nebulizer solution Take 2.5 mLs (0.5 mg total) by nebulization 4 (four) times daily. 75 mL 12   levalbuterol (XOPENEX HFA) 45 MCG/ACT inhaler Inhale 2 puffs into the lungs every 6 (six) hours as needed. 15 g 0   levalbuterol (XOPENEX) 1.25 MG/3ML nebulizer solution USE 1 VIAL BY NEBULIZATION EVERY 6 (SIX) HOURS AS NEEDED FOR WHEEZING. 540 mL 1   Levonorgestrel-Ethinyl Estradiol (AMETHIA) 0.1-0.02 & 0.01 MG tablet Take 1 tablet by mouth daily.     metoprolol succinate (TOPROL-XL) 100 MG 24 hr tablet Take 1 tablet (100mg ) by mouth in the morning and 1/2 tablet (50mg ) in the evening 135 tablet 3   metoprolol tartrate (LOPRESSOR) 50 MG tablet TAKE 1 TABLET (50 MG TOTAL) BY MOUTH AS NEEDED (FOR PALPITATIONS). 90 tablet 2   mometasone (ASMANEX, 120 METERED DOSES,) 220 MCG/ACT inhaler Inhale 2 puffs into the lungs 2 (two) times daily. 1 each 5   montelukast (SINGULAIR) 10 MG tablet Take 1 tablet (10 mg total) by mouth daily. 90 tablet 1   ondansetron (ZOFRAN) 4 MG tablet Take 1 tablet (4 mg total) by mouth every 8 (eight) hours as needed for  nausea or vomiting. 20 tablet 0   pantoprazole (PROTONIX) 40 MG tablet TAKE 1 TABLET BY MOUTH TWICE A DAY 180 tablet 1   potassium chloride SA (KLOR-CON M) 20 MEQ tablet Take 1 tablet (20 mEq total) by mouth daily. 3 tablet 0   Prenatal Vit-Fe Fumarate-FA (MULTIVITAMIN-PRENATAL) 27-0.8 MG TABS tablet Take 1 tablet by mouth daily at 12 noon.     Probiotic Product (PHILLIPS COLON HEALTH) CAPS      famotidine (PEPCID) 40 MG tablet Take 1 tablet (40 mg total) by mouth daily. (Patient taking differently: Take 40 mg by mouth as needed.) 90 tablet 2   sucralfate (CARAFATE) 1 g tablet Take 1 tablet (1 g total) by mouth 4 (four) times daily -  with meals and  at bedtime. 120 tablet 0   No facility-administered medications prior to visit.   Allergies  Allergen Reactions   Budesonide-Formoterol Fumarate Other (See Comments)    Causes Hypertension  Causes Hypertension    Made blood pressure go up   Other     Tree nuts   Penicillins Hives   Raspberry Hives   Spiriva Respimat [Tiotropium Bromide Monohydrate] Other (See Comments)    Pt states medication causes dizziness   Albuterol Palpitations   Losartan Rash     Objective:   Today's Vitals   08/13/23 1018 08/13/23 1021  BP: (!) 150/95 (!) 143/76  Pulse: 98   Resp: 18   Temp: 98.4 F (36.9 C)   TempSrc: Temporal   SpO2: 99%   Weight: 293 lb 3.2 oz (133 kg)   PainSc: 0-No pain    Body mass index is 44.58 kg/m.   General: Well developed, well nourished. No acute distress. Lungs: Clear to auscultation bilaterally. No wheezing, rales or rhonchi. CV: RRR without murmurs or rubs. Pulses 2+ bilaterally. Psych: Alert and oriented. Normal mood and affect.  Health Maintenance Due  Topic Date Due   HIV Screening  Never done   Hepatitis C Screening  Never done   Pneumococcal Vaccine 9-66 Years old (2 of 2 - PCV) 05/13/2019     Assessment & Plan:   Problem List Items Addressed This Visit       Cardiovascular and Mediastinum    Essential hypertension - Primary   BP is still elevated today. She added metoprolol tartrate 50 mg to her regimen earlier this week. Recommend we follow this for now.        Respiratory   Moderate persistent asthma without complication   Well-controlled. Continue montelukast 10 mg daily, levalbuterol PRN, and Pulmicort daily.        Other   Postviral fatigue syndrome   Ms. Bayer continues to have fatigue associated with having had recent COVID and influenza A. Recommend she rest as needed, hydrate well, and we will follow for now.      Tachycardia   Possible POTS. Continue working with cardiology. Continue metoprolol 100/50 regimen.       Return in about 2 months (around 10/11/2023) for Reassessment.   Loyola Mast, MD

## 2023-08-13 NOTE — Telephone Encounter (Signed)
 Pt was seen by Dr Graciela Husbands on 08/11/23.  See OV note for complete details.

## 2023-08-13 NOTE — Assessment & Plan Note (Signed)
 BP is still elevated today. She added metoprolol tartrate 50 mg to her regimen earlier this week. Recommend we follow this for now.

## 2023-08-13 NOTE — Assessment & Plan Note (Signed)
 Well-controlled. Continue montelukast 10 mg daily, levalbuterol PRN, and Pulmicort daily.

## 2023-08-16 NOTE — Telephone Encounter (Signed)
OV 2/25

## 2023-08-18 DIAGNOSIS — F419 Anxiety disorder, unspecified: Secondary | ICD-10-CM | POA: Diagnosis not present

## 2023-08-18 DIAGNOSIS — Z630 Problems in relationship with spouse or partner: Secondary | ICD-10-CM | POA: Diagnosis not present

## 2023-08-19 ENCOUNTER — Ambulatory Visit: Payer: BC Managed Care – PPO | Admitting: Neurology

## 2023-08-19 DIAGNOSIS — D806 Antibody deficiency with near-normal immunoglobulins or with hyperimmunoglobulinemia: Secondary | ICD-10-CM | POA: Diagnosis not present

## 2023-08-22 DIAGNOSIS — D806 Antibody deficiency with near-normal immunoglobulins or with hyperimmunoglobulinemia: Secondary | ICD-10-CM | POA: Diagnosis not present

## 2023-08-22 DIAGNOSIS — J4541 Moderate persistent asthma with (acute) exacerbation: Secondary | ICD-10-CM | POA: Diagnosis not present

## 2023-08-22 DIAGNOSIS — G90A Postural orthostatic tachycardia syndrome (POTS): Secondary | ICD-10-CM | POA: Diagnosis not present

## 2023-08-26 DIAGNOSIS — R509 Fever, unspecified: Secondary | ICD-10-CM | POA: Diagnosis not present

## 2023-08-26 DIAGNOSIS — R0981 Nasal congestion: Secondary | ICD-10-CM | POA: Diagnosis not present

## 2023-08-26 DIAGNOSIS — K529 Noninfective gastroenteritis and colitis, unspecified: Secondary | ICD-10-CM | POA: Diagnosis not present

## 2023-08-26 DIAGNOSIS — R5383 Other fatigue: Secondary | ICD-10-CM | POA: Diagnosis not present

## 2023-08-28 NOTE — Progress Notes (Unsigned)
 [facility] [address] [place] [address] Dept: [phone] Dept Fax: [phone]  Acute Care Office Visit  Subjective:   Nicole Mendoza May 19, 1991 08/29/2023  No chief complaint on file.   HPI: Discussed the use of AI scribe software for clinical note transcription with the patient, who gave verbal consent to proceed.  History of Present Illness             The following portions of the patient's history were reviewed and updated as appropriate: past medical history, past surgical history, family history, social history, allergies, medications, and problem list.   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   COVID-19 06/25/2023   Acute non-recurrent maxillary sinusitis 06/19/2023   Elevated TSH 06/19/2023   NAFLD (nonalcoholic fatty liver disease) 44/08/4740   Other fatigue 11/25/2022   Prediabetes 10/15/2022   Eating disorder 10/15/2022   BMI 45.0-49.9, adult (HCC) Current BMI 45.2 10/15/2022   Adjustment disorder with anxiety 06/12/2022   Vocal cord dysfunction 06/03/2022   Positive ANA (antinuclear antibody) 07/13/2021   Fibromyalgia 07/13/2021   Dysfunction of left eustachian tube 06/26/2021   Vertigo 06/26/2021   Obstructive sleep apnea 02/26/2021   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   Recurrent infections 12/20/2020   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 11/22/2020   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   Recurrent sinusitis 11/09/2020   Pre-nodular edema of the vocal folds 11/09/2020   ASCUS of cervix with negative high risk HPV 07/10/2020   Cardiac murmur 09/02/2019   Tachycardia 09/01/2019   Salivary stone 09/01/2019   Anaphylactic shock due to adverse food reaction 01/22/2019   Seasonal and perennial  allergic rhinitis 01/22/2019   Morbid obesity (HCC) 10/28/2017   Moderate persistent asthma without complication 07/29/2017   Essential hypertension 04/22/2017   Dysmenorrhea 06/26/2016   Menorrhagia with regular cycle 06/26/2016   Gastroesophageal reflux disease 12/22/2015   Vitamin D deficiency 11/02/2014   Past Medical History:  Diagnosis Date   Allergic rhinitis    Allergy    Angio-edema    Asthma    Dysmenorrhea    Fatty liver    GERD (gastroesophageal reflux disease)    Hypertension    Recurrent upper respiratory infection (URI)    Sleep apnea    Tachycardia    Past Surgical History:  Procedure Laterality Date   NO PAST SURGERIES     Family History  Adopted: Yes  Problem Relation Age of Onset   Asthma Mother    Diabetes Mother    High blood pressure Mother    Heart disease Mother    Bipolar disorder Mother    Sleep apnea Mother    Obesity Mother    Allergic rhinitis Father    Asthma Father    Diabetes Father    High blood pressure Father    Hyperlipidemia Father    Obesity Father    Allergic rhinitis Sister    Asthma Sister    Angioedema Neg Hx    Atopy Neg Hx    Eczema Neg Hx    Immunodeficiency Neg Hx    Urticaria Neg Hx     Current Outpatient Medications:    Ascorbic Acid (SUPER C COMPLEX PO), , Disp: , Rfl:    azelastine (ASTELIN) 0.1 % nasal spray, PLACE 2 SPRAYS INTO BOTH  NOSTRILS 2 (TWO) TIMES DAILY. USE IN EACH NOSTRIL AS DIRECTED, Disp: , Rfl:    budesonide (PULMICORT) 0.25 MG/2ML nebulizer solution, Take 2 mLs (0.25 mg total) by nebulization 2 (two) times daily as needed., Disp: 120 mL, Rfl: 12   cetirizine (ZYRTEC) 10 MG tablet, Take 10 mg by mouth daily., Disp: , Rfl:    Cholecalciferol 100 MCG (4000 UT) CAPS, Take 1 capsule (4,000 Units total) by mouth daily., Disp: 30 capsule, Rfl: 0   dexlansoprazole (DEXILANT) 60 MG capsule, Take 1 capsule (60 mg total) by mouth daily., Disp: 90 capsule, Rfl: 3   EPINEPHrine 0.3 mg/0.3 mL IJ SOAJ  injection, , Disp: , Rfl:    famotidine (PEPCID) 40 MG tablet, Take 1 tablet (40 mg total) by mouth daily. (Patient taking differently: Take 40 mg by mouth as needed.), Disp: 90 tablet, Rfl: 2   ferrous sulfate 325 (65 FE) MG tablet, 3 (three) times a week. , Disp: , Rfl:    fluticasone (FLONASE) 50 MCG/ACT nasal spray, Place 2 sprays into both nostrils daily., Disp: 16 g, Rfl: 5   Fluticasone Propionate (XHANCE) 93 MCG/ACT EXHU, Place 2 sprays into the nose 2 (two) times daily as needed., Disp: 32 mL, Rfl: 11   hydrOXYzine (ATARAX) 25 MG tablet, Take 1 tablet (25 mg total) by mouth every 6 (six) hours as needed for anxiety (allergies)., Disp: 30 tablet, Rfl: 1   ipratropium (ATROVENT) 0.02 % nebulizer solution, Take 1.25 mLs (0.25 mg total) by nebulization 4 (four) times daily. Mix 0.5 vial in with levalbuterol nebulizer and can use every 6 hours as needed for shortness of breath and wheezing. (Patient taking differently: Take 0.25 mg by nebulization as needed. Mix 0.5 vial in with levalbuterol nebulizer and can use every 6 hours as needed for shortness of breath and wheezing.), Disp: 75 mL, Rfl: 3   ipratropium (ATROVENT) 0.02 % nebulizer solution, Take 2.5 mLs (0.5 mg total) by nebulization 4 (four) times daily., Disp: 75 mL, Rfl: 12   levalbuterol (XOPENEX HFA) 45 MCG/ACT inhaler, Inhale 2 puffs into the lungs every 6 (six) hours as needed., Disp: 15 g, Rfl: 0   levalbuterol (XOPENEX) 1.25 MG/3ML nebulizer solution, USE 1 VIAL BY NEBULIZATION EVERY 6 (SIX) HOURS AS NEEDED FOR WHEEZING., Disp: 540 mL, Rfl: 1   Levonorgestrel-Ethinyl Estradiol (AMETHIA) 0.1-0.02 & 0.01 MG tablet, Take 1 tablet by mouth daily., Disp: , Rfl:    metoprolol succinate (TOPROL-XL) 100 MG 24 hr tablet, Take 1 tablet (100mg ) by mouth in the morning and 1/2 tablet (50mg ) in the evening, Disp: 135 tablet, Rfl: 3   metoprolol tartrate (LOPRESSOR) 50 MG tablet, TAKE 1 TABLET (50 MG TOTAL) BY MOUTH AS NEEDED (FOR PALPITATIONS).,  Disp: 90 tablet, Rfl: 2   mometasone (ASMANEX, 120 METERED DOSES,) 220 MCG/ACT inhaler, Inhale 2 puffs into the lungs 2 (two) times daily., Disp: 1 each, Rfl: 5   montelukast (SINGULAIR) 10 MG tablet, Take 1 tablet (10 mg total) by mouth daily., Disp: 90 tablet, Rfl: 1   ondansetron (ZOFRAN) 4 MG tablet, Take 1 tablet (4 mg total) by mouth every 8 (eight) hours as needed for nausea or vomiting., Disp: 20 tablet, Rfl: 0   pantoprazole (PROTONIX) 40 MG tablet, TAKE 1 TABLET BY MOUTH TWICE A DAY, Disp: 180 tablet, Rfl: 1   potassium chloride SA (KLOR-CON M) 20 MEQ tablet, Take 1 tablet (20 mEq total) by mouth daily., Disp: 3 tablet, Rfl: 0   Prenatal Vit-Fe Fumarate-FA (MULTIVITAMIN-PRENATAL) 27-0.8 MG TABS  tablet, Take 1 tablet by mouth daily at 12 noon., Disp: , Rfl:    Probiotic Product (PHILLIPS COLON HEALTH) CAPS, , Disp: , Rfl:    sucralfate (CARAFATE) 1 g tablet, Take 1 tablet (1 g total) by mouth 4 (four) times daily -  with meals and at bedtime., Disp: 120 tablet, Rfl: 0 Allergies  Allergen Reactions   Budesonide-Formoterol Fumarate Other (See Comments)    Causes Hypertension  Causes Hypertension    Made blood pressure go up   Other     Tree nuts   Penicillins Hives   Raspberry Hives   Spiriva Respimat [Tiotropium Bromide Monohydrate] Other (See Comments)    Pt states medication causes dizziness   Albuterol Palpitations   Losartan Rash     ROS: A complete ROS was performed with pertinent positives/negatives noted in the HPI. The remainder of the ROS are negative.    Objective:   There were no vitals filed for this visit.  GENERAL: Well-appearing, in NAD. Well nourished.  SKIN: Pink, warm and dry. No rash, lesion, ulceration, or ecchymoses.  NECK: Trachea midline. Full ROM w/o pain or tenderness. No lymphadenopathy.  RESPIRATORY: Chest wall symmetrical. Respirations even and non-labored. Breath sounds clear to auscultation bilaterally.  CARDIAC: S1, S2 present, regular rate  and rhythm. Peripheral pulses 2+ bilaterally.  GI: Abdomen soft, non-tender. Normoactive bowel sounds. No rebound tenderness. No hepatomegaly or splenomegaly. No CVA tenderness.  EXTREMITIES: Without clubbing, cyanosis, or edema.  NEUROLOGIC: No motor or sensory deficits. Steady, even gait.  PSYCH/MENTAL STATUS: Alert, oriented x 3. Cooperative, appropriate mood and affect.    No results found for any visits on 08/29/23.    Assessment & Plan:  Assessment and Plan               There are no diagnoses linked to this encounter. No orders of the defined types were placed in this encounter.  No orders of the defined types were placed in this encounter.  Lab Orders  No laboratory test(s) ordered today   No images are attached to the encounter or orders placed in the encounter.  No follow-ups on file.   Salvatore Decent, FNP

## 2023-08-29 ENCOUNTER — Encounter: Payer: Self-pay | Admitting: Internal Medicine

## 2023-08-29 ENCOUNTER — Ambulatory Visit (INDEPENDENT_AMBULATORY_CARE_PROVIDER_SITE_OTHER): Admitting: Internal Medicine

## 2023-08-29 VITALS — BP 128/68 | HR 92 | Temp 98.4°F | Ht 68.0 in | Wt 296.2 lb

## 2023-08-29 DIAGNOSIS — G90A Postural orthostatic tachycardia syndrome (POTS): Secondary | ICD-10-CM | POA: Diagnosis not present

## 2023-08-29 DIAGNOSIS — R300 Dysuria: Secondary | ICD-10-CM

## 2023-08-29 LAB — POCT URINALYSIS DIPSTICK
Bilirubin, UA: NEGATIVE
Blood, UA: NEGATIVE
Glucose, UA: NEGATIVE
Ketones, UA: NEGATIVE
Leukocytes, UA: NEGATIVE
Nitrite, UA: NEGATIVE
Protein, UA: POSITIVE — AB
Spec Grav, UA: >=1.03 — AB
Urobilinogen, UA: 0.2 U/dL
pH, UA: 6

## 2023-08-29 MED ORDER — NITROFURANTOIN MONOHYD MACRO 100 MG PO CAPS: 100.0000 mg | ORAL_CAPSULE | Freq: Two times a day (BID) | ORAL | 0 refills | Status: AC

## 2023-08-30 LAB — URINE CULTURE
MICRO NUMBER:: 16203004
SPECIMEN QUALITY:: ADEQUATE

## 2023-09-02 ENCOUNTER — Encounter: Payer: Self-pay | Admitting: Internal Medicine

## 2023-09-02 DIAGNOSIS — D806 Antibody deficiency with near-normal immunoglobulins or with hyperimmunoglobulinemia: Secondary | ICD-10-CM | POA: Diagnosis not present

## 2023-09-02 DIAGNOSIS — F419 Anxiety disorder, unspecified: Secondary | ICD-10-CM | POA: Diagnosis not present

## 2023-09-02 DIAGNOSIS — Z630 Problems in relationship with spouse or partner: Secondary | ICD-10-CM | POA: Diagnosis not present

## 2023-09-09 ENCOUNTER — Other Ambulatory Visit: Payer: Self-pay

## 2023-09-09 ENCOUNTER — Telehealth (HOSPITAL_BASED_OUTPATIENT_CLINIC_OR_DEPARTMENT_OTHER): Payer: Self-pay

## 2023-09-09 ENCOUNTER — Ambulatory Visit (INDEPENDENT_AMBULATORY_CARE_PROVIDER_SITE_OTHER): Payer: BC Managed Care – PPO | Admitting: Allergy & Immunology

## 2023-09-09 ENCOUNTER — Encounter: Payer: Self-pay | Admitting: Allergy & Immunology

## 2023-09-09 VITALS — BP 130/80 | HR 95 | Temp 98.0°F | Resp 12

## 2023-09-09 DIAGNOSIS — J4540 Moderate persistent asthma, uncomplicated: Secondary | ICD-10-CM

## 2023-09-09 DIAGNOSIS — K219 Gastro-esophageal reflux disease without esophagitis: Secondary | ICD-10-CM

## 2023-09-09 DIAGNOSIS — B370 Candidal stomatitis: Secondary | ICD-10-CM

## 2023-09-09 DIAGNOSIS — U099 Post covid-19 condition, unspecified: Secondary | ICD-10-CM

## 2023-09-09 DIAGNOSIS — J383 Other diseases of vocal cords: Secondary | ICD-10-CM

## 2023-09-09 DIAGNOSIS — J3089 Other allergic rhinitis: Secondary | ICD-10-CM | POA: Diagnosis not present

## 2023-09-09 DIAGNOSIS — D806 Antibody deficiency with near-normal immunoglobulins or with hyperimmunoglobulinemia: Secondary | ICD-10-CM | POA: Diagnosis not present

## 2023-09-09 DIAGNOSIS — J302 Other seasonal allergic rhinitis: Secondary | ICD-10-CM

## 2023-09-09 MED ORDER — FLUCONAZOLE 150 MG PO TABS: 150.0000 mg | ORAL_TABLET | Freq: Every day | ORAL | 3 refills | Status: AC

## 2023-09-09 MED ORDER — XHANCE 93 MCG/ACT NA EXHU: 2.0000 | INHALANT_SUSPENSION | Freq: Two times a day (BID) | NASAL | Status: DC | PRN

## 2023-09-09 MED ORDER — XHANCE 93 MCG/ACT NA EXHU: 2.0000 | INHALANT_SUSPENSION | Freq: Two times a day (BID) | NASAL | 11 refills | Status: DC | PRN

## 2023-09-09 MED ORDER — BEPOTASTINE BESILATE 1.5 % OP SOLN
1.0000 [drp] | Freq: Two times a day (BID) | OPHTHALMIC | 5 refills | Status: DC
Start: 2023-09-09 — End: 2023-10-10

## 2023-09-09 NOTE — Progress Notes (Signed)
 FOLLOW UP  Date of Service/Encounter: 09/09/23  Assessment:   Mild persistent asthma, uncomplicated - well controlled at this point in time   Vocal cord dysfunction - followed by Dr. Rubye O[facility] at [facility]   Recurrent infections - improved following Pneumovax, but then worsened (streptococcal avidity assay abnormal) - on Privigen with a decrease in frequency of infections, although we are going to try to increase frequency to every 3 weeks   Seasonal and perennial allergic rhinitis (trees, weeds, grasses and dust mites) - not well controlled    Gastroesophageal reflux disease - on Dexilant and famotidine and PRN carafate    Adverse food reactions - resolved with minimizing exposure to food additives and avoiding peanuts/tree nuts   Snoring with perceived poor sleep quality - improved with use of the CPAP   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  - Consent for Dupixent provided.  - [name] will reach out regarding the approval process.  - Daily controller medication(s): Singulair 10mg  daily and Asmanex 2 puffs twice daily  - Prior to physical activity: Xopenex 2 puffs 10-15 minutes before physical activity.  - Rescue medications: Xopenex 4 puffs every 4-6 hours as needed - Changes during respiratory infections or worsening symptoms: Add on Pulmicort 0.25mg  to one treatment twice daily for TWO WEEKS. - Asthma control goals:  * Full participation in all desired activities (may need albuterol before activity) * Albuterol use two time or less a week on average (not counting use with activity) * Cough interfering with sleep two time or less a month * Oral steroids no more than once a year * No hospitalizations  Allergic  rhinitis - with marked conjunctivitis  - Continue saline rinses several times a day - Continue cetirizine 10 mg once a day as needed for a runny nose or itch. - We will get Xhance back on board. Medication Samples have been provided to the patient. - Continue with Astelin two sprays per nostril twice daily in case this is related to postnasal drip.  - I added on Bepreve eye drops one drop per eye twice daily.   Recurrent infections - We will continue with the Privagen every three weeks. - Dr. Allena Katz was not excited about your genetic testing.   Reflux - Continue dietary and lifestyle modifications as listed below - Continue with Dexilant approved again with your insurance. - Continue with famotidine 40mg  nightly. - Continue with Carafate as needed.   Vocal cord dysfunction - Continue with speech therapy at home lessons as you are doing.  - Continue with the belly breathing exercises.  Food allergy - Continue to avoid tree nuts.  In case of an allergic reaction, take Benadryl 50 mg every 4 hours, and if life-threatening symptoms occur, inject with AuviQ 0.3 mg. - We will get that mixed tree nut challenge done at some point.   Return in about 2 months (around 11/09/2023). You can have the follow up appointment with Dr. Dellis Anes or a Nurse Practicioner (our Nurse Practitioners are excellent and always have Physician oversight!).    Subjective:   Nicole Mendoza is a 33 y.o. 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   COVID-19 06/25/2023   Acute non-recurrent maxillary sinusitis 06/19/2023   Elevated TSH 06/19/2023   NAFLD (nonalcoholic fatty liver disease) 29/56/2130   Other fatigue 11/25/2022   Prediabetes 10/15/2022   Eating disorder 10/15/2022   BMI  45.0-49.9, adult (HCC) Current BMI 45.2 10/15/2022   Adjustment disorder with anxiety 06/12/2022   Vocal cord dysfunction 06/03/2022   Positive ANA (antinuclear antibody) 07/13/2021   Fibromyalgia 07/13/2021   Dysfunction of left eustachian tube 06/26/2021   Vertigo 06/26/2021   Obstructive sleep apnea 02/26/2021   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   Recurrent infections 12/20/2020   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 11/22/2020   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   Recurrent sinusitis 11/09/2020   Pre-nodular edema of the vocal folds 11/09/2020   ASCUS of cervix with negative high risk HPV 07/10/2020   Cardiac murmur 09/02/2019   Tachycardia 09/01/2019   Salivary stone 09/01/2019   Anaphylactic shock due to adverse food reaction 01/22/2019   Seasonal and perennial allergic rhinitis 01/22/2019   Morbid obesity (HCC) 10/28/2017   Moderate persistent asthma without complication 07/29/2017   Essential hypertension 04/22/2017   Dysmenorrhea 06/26/2016   Menorrhagia with regular cycle 06/26/2016   Gastroesophageal reflux disease 12/22/2015   Vitamin D deficiency 11/02/2014    History obtained from: chart review and patient.  Discussed the use of AI scribe software for clinical note transcription with the patient and/or guardian, who gave verbal consent to proceed.  Nicole Mendoza is a 33 y.o. female presenting for a follow up visit.  She was last seen in February 2025.  At that time, her lung testing was not done.  We talked about starting Harrington Challenger again.  We continue with Singulair and Asmanex 2 puffs twice daily.  She has Pulmicort that she has during flares.  For her allergic rhinitis, we continue with Zyrtec as well as Flonase and Astelin.  She remained on her immunoglobulin which we increased to every 3 weeks per recommendations from Dr. Allena Katz at  [facility].  We talked about changing formulations as well.  For her reflux, we continue with famotidine and Carafate.  She did want to try to get Dexilant approved on her new insurance.  For her VCD, she continue with speech therapy as she was doing.  She continue to avoid tree nuts.  Since last visit, she has unfortunately had a rather eventful course.   Asthma/Respiratory Symptom History: She experiences breathing difficulties with fluctuating lung function, particularly during the winter months. She was on Fasenra 2 to 3 years ago and is considering trying Dupixent for better asthma control.  She has not been on prednisone recently for her breathing.  She has not been in the hospital.  She did have a flu around 1 to 2 weeks ago, but actually tolerated it pretty well. She has a history of thrush and is currently out of Diflucan. She has not been on steroids recently and is unsure of the cause. She rinses her mouth after using her asthma medication, Asmanex, to prevent thrush.  Allergic Rhinitis Symptom History: She has significant eye itching and uses over-the-counter antihistamine drops, specifically ketotifen, which have not been effective. Despite a history of allergy shots, she continues to react and is considering  steroid drops for quicker relief.  She was on allergy shots in the past, but these have not been on board for a while.  She was having some breakthrough symptoms and she was having many reactions to her allergy shots, so he stopped.  Food Allergy Symptom History: She continues to avoid tree nuts.  Her EpiPen is up-to-date.    GERD Symptom History: She was able to get the GERD medication approved.  The Dexilant was fully paid for because she has met her out of pocket expenses for the year.  Infection Symptom History: She receives infusions every three weeks, though her last infusion was delayed. During a recent respiratory virus episode, her wheezing was less severe, which she  attributes to Privigen. She had a fever for a week, which has resolved except for nausea.  Otherwise, there have been no changes to her past medical history, surgical history, family history, or social history.    Review of systems otherwise negative other than that mentioned in the HPI.    Objective:   Blood pressure 130/80, pulse 95, temperature 98 F (36.7 C), resp. rate 12, last menstrual period 07/26/2023, SpO2 95%. There is no height or weight on file to calculate BMI.    Physical Exam Vitals reviewed.  Constitutional:      Appearance: She is well-developed. She is obese.     Comments: Very talkative.  Not ill-appearing.  Cooperative with the exam.  Seems fairly upbeat at this point.  HENT:     Head: Normocephalic and atraumatic.     Right Ear: Tympanic membrane, ear canal and external ear normal.     Left Ear: Tympanic membrane, ear canal and external ear normal.     Nose: Rhinorrhea present. No nasal deformity, septal deviation or mucosal edema.     Right Turbinates: Enlarged, swollen and pale.     Left Turbinates: Enlarged, swollen and pale.     Right Sinus: No maxillary sinus tenderness or frontal sinus tenderness.     Left Sinus: No maxillary sinus tenderness or frontal sinus tenderness.     Comments: No nasal polyps. Scant clear rhinorrhea.     Mouth/Throat:     Lips: Pink.     Mouth: Mucous membranes are not pale and not dry.     Pharynx: Uvula midline. No pharyngeal swelling or oropharyngeal exudate.     Tonsils: 2+ on the right. 2+ on the left.     Comments: Cobblestoning present in the posterior oropharynx.  Eyes:     General: Lids are normal. Allergic shiner present.        Right eye: No discharge.        Left eye: No discharge.     Conjunctiva/sclera:     Right eye: Right conjunctiva is not injected. No chemosis.    Left eye: Left conjunctiva is not injected. No chemosis.    Pupils: Pupils are equal, round, and reactive to light.  Cardiovascular:      Rate and Rhythm: Normal rate and regular rhythm.     Heart sounds: Normal heart sounds.  Pulmonary:     Effort: Pulmonary effort is normal. No tachypnea, accessory muscle usage or respiratory distress.     Breath sounds: Normal breath sounds. No wheezing, rhonchi or rales.     Comments: Moving air well in all lung fields.  No increased work of breathing. Chest:     Chest wall: No tenderness.  Lymphadenopathy:     Cervical: No cervical adenopathy.  Skin:  General: Skin is warm.     Capillary Refill: Capillary refill takes less than 2 seconds.     Coloration: Skin is not pale.     Findings: No abrasion, erythema, petechiae or rash. Rash is not papular, urticarial or vesicular.     Comments: No eczematous or urticarial lesions noted.  Neurological:     Mental Status: She is alert.  Psychiatric:        Behavior: Behavior is cooperative.      Diagnostic studies:    Spirometry: results normal (FEV1: 2.58/71%, FVC: 3.29/75%, FEV1/FVC: 78%).    Spirometry consistent with normal pattern.   Allergy Studies: none       Malachi Bonds, MD  [facility]

## 2023-09-09 NOTE — Patient Instructions (Addendum)
 Asthma - Lung testing continue to slowly decrease.  - Consent for Dupixent provided.  - [name] will reach out regarding the approval process.  - Daily controller medication(s): Singulair 10mg  daily and Asmanex 2 puffs twice daily  - Prior to physical activity: Xopenex 2 puffs 10-15 minutes before physical activity.  - Rescue medications: Xopenex 4 puffs every 4-6 hours as needed - Changes during respiratory infections or worsening symptoms: Add on Pulmicort 0.25mg  to one treatment twice daily for TWO WEEKS. - Asthma control goals:  * Full participation in all desired activities (may need albuterol before activity) * Albuterol use two time or less a week on average (not counting use with activity) * Cough interfering with sleep two time or less a month * Oral steroids no more than once a year * No hospitalizations  Allergic rhinitis - with marked conjunctivitis  - Continue saline rinses several times a day - Continue cetirizine 10 mg once a day as needed for a runny nose or itch. - We will get Xhance back on board. Medication Samples have been provided to the patient.  Drug name: Timmothy Sours       Strength:        Qty: 6.76mL  LOT: [id number]  Exp.Date: 06/2024  Dosing instructions: 2 sprays each nostril twice daily as needed.  The patient has been instructed regarding the correct time, dose, and frequency of taking this medication, including desired effects and most common side effects.   Genia Harold 11:02 AM 09/09/2023   - Continue with Astelin two sprays per nostril twice daily in case this is related to postnasal drip.  - I added on Bepreve eye drops one drop per eye twice daily.   Recurrent infections - We will continue with the Privagen every three weeks. - Dr. Allena Katz was not excited about your genetic testing.   Reflux - Continue dietary and lifestyle modifications as listed below - Continue with Dexilant approved again with your insurance. - Continue with famotidine 40mg   nightly. - Continue with Carafate as needed.   Vocal cord dysfunction - Continue with speech therapy at home lessons as you are doing.  - Continue with the belly breathing exercises.  Food allergy - Continue to avoid tree nuts.  In case of an allergic reaction, take Benadryl 50 mg every 4 hours, and if life-threatening symptoms occur, inject with AuviQ 0.3 mg. - We will get that mixed tree nut challenge done at some point.   Return in about 2 months (around 11/09/2023). You can have the follow up appointment with Dr. Dellis Anes or a Nurse Practicioner (our Nurse Practitioners are excellent and always have Physician oversight!).    Please inform us of any Emergency Department visits, hospitalizations, or changes in symptoms. Call us before going to the ED for breathing or allergy symptoms since we might be able to fit you in for a sick visit. Feel free to contact us anytime with any questions, problems, or concerns.  It was a pleasure to see you again today!  Websites that have reliable patient information: 1. American Academy of Asthma, Allergy, and Immunology: www.aaaai.org 2. Food Allergy Research and Education (FARE): foodallergy.org 3. Mothers of Asthmatics: http://www.asthmacommunitynetwork.org 4. American College of Allergy, Asthma, and Immunology: www.acaai.org      "Like" [place] on Facebook and Instagram for our latest updates!      A healthy democracy works best when [employer] participate! Make sure you are registered to vote! If you have moved or changed any of  your contact information, you will need to get this updated before voting! Scan the QR codes below to learn more!

## 2023-09-10 ENCOUNTER — Telehealth: Payer: Self-pay

## 2023-09-10 NOTE — Telephone Encounter (Signed)
*  Asthma/Allergy  Pharmacy Patient Advocate Encounter   Received notification from Fax that prior authorization for Nicole Mendoza is required/requested.   Insurance verification completed.   The patient is insured through [facility] .   Per test claim: PA required; PA submitted to above mentioned insurance via Fax Key/confirmation #/[id number] N/A Status is pending

## 2023-09-11 DIAGNOSIS — D806 Antibody deficiency with near-normal immunoglobulins or with hyperimmunoglobulinemia: Secondary | ICD-10-CM | POA: Diagnosis not present

## 2023-09-15 ENCOUNTER — Telehealth: Payer: Self-pay | Admitting: Allergy & Immunology

## 2023-09-15 DIAGNOSIS — Z630 Problems in relationship with spouse or partner: Secondary | ICD-10-CM | POA: Diagnosis not present

## 2023-09-15 DIAGNOSIS — F419 Anxiety disorder, unspecified: Secondary | ICD-10-CM | POA: Diagnosis not present

## 2023-09-15 NOTE — Telephone Encounter (Signed)
 Called patient - DOB/NEED DPR verified - LMOVM advising prescription sent to [facility] on 08/04/23 with 5 refills. Patient advised to contact pharmacy regarding refills. Patient also advised if there is a PA for the Asmanex from 08/04/23 - our PA Team will process and send to her insurance for approval - once we receive a decision, we will let her know - our office does not have/carry samples.  If/When patient call back - please advise of the above notation.

## 2023-09-15 NOTE — Telephone Encounter (Signed)
 Pt called and requested a she needs a prior authorization for medication Asmanex (120 Metered Doses) mometasone (ASMANEX, 120 METERED DOSES,) 220 MCG/ACT inhaler and she requested a call back stating she is out and wanted to know can she get some samples.

## 2023-09-17 ENCOUNTER — Other Ambulatory Visit (HOSPITAL_COMMUNITY): Payer: Self-pay

## 2023-09-17 NOTE — Telephone Encounter (Signed)
Now approved.

## 2023-09-18 ENCOUNTER — Other Ambulatory Visit: Payer: Self-pay | Admitting: Family Medicine

## 2023-09-18 ENCOUNTER — Other Ambulatory Visit: Payer: Self-pay | Admitting: Allergy

## 2023-09-18 DIAGNOSIS — R002 Palpitations: Secondary | ICD-10-CM

## 2023-09-18 MED ORDER — FLUTICASONE PROPIONATE HFA 220 MCG/ACT IN AERO
2.0000 | INHALATION_SPRAY | Freq: Two times a day (BID) | RESPIRATORY_TRACT | 5 refills | Status: DC
Start: 2023-09-18 — End: 2023-10-10

## 2023-09-18 NOTE — Progress Notes (Signed)
 Per Dr Ellouise Newer request (not available to use Epic) to send in Flovent as she is not yet due to get a refill of Asmanex and needs an inhaled steroid now

## 2023-09-19 DIAGNOSIS — R0981 Nasal congestion: Secondary | ICD-10-CM | POA: Diagnosis not present

## 2023-09-19 DIAGNOSIS — J019 Acute sinusitis, unspecified: Secondary | ICD-10-CM | POA: Diagnosis not present

## 2023-09-19 DIAGNOSIS — B370 Candidal stomatitis: Secondary | ICD-10-CM | POA: Diagnosis not present

## 2023-09-19 DIAGNOSIS — R070 Pain in throat: Secondary | ICD-10-CM | POA: Diagnosis not present

## 2023-09-22 ENCOUNTER — Other Ambulatory Visit: Payer: Self-pay

## 2023-09-23 DIAGNOSIS — D806 Antibody deficiency with near-normal immunoglobulins or with hyperimmunoglobulinemia: Secondary | ICD-10-CM | POA: Diagnosis not present

## 2023-09-24 DIAGNOSIS — E559 Vitamin D deficiency, unspecified: Secondary | ICD-10-CM | POA: Diagnosis not present

## 2023-09-24 DIAGNOSIS — Z131 Encounter for screening for diabetes mellitus: Secondary | ICD-10-CM | POA: Diagnosis not present

## 2023-09-24 DIAGNOSIS — U099 Post covid-19 condition, unspecified: Secondary | ICD-10-CM | POA: Diagnosis not present

## 2023-09-24 DIAGNOSIS — Z1329 Encounter for screening for other suspected endocrine disorder: Secondary | ICD-10-CM | POA: Diagnosis not present

## 2023-09-24 DIAGNOSIS — D899 Disorder involving the immune mechanism, unspecified: Secondary | ICD-10-CM | POA: Diagnosis not present

## 2023-09-24 DIAGNOSIS — D509 Iron deficiency anemia, unspecified: Secondary | ICD-10-CM | POA: Diagnosis not present

## 2023-09-24 DIAGNOSIS — Z1321 Encounter for screening for nutritional disorder: Secondary | ICD-10-CM | POA: Diagnosis not present

## 2023-09-24 DIAGNOSIS — M797 Fibromyalgia: Secondary | ICD-10-CM | POA: Diagnosis not present

## 2023-09-24 DIAGNOSIS — K580 Irritable bowel syndrome with diarrhea: Secondary | ICD-10-CM | POA: Diagnosis not present

## 2023-09-24 DIAGNOSIS — L659 Nonscarring hair loss, unspecified: Secondary | ICD-10-CM | POA: Diagnosis not present

## 2023-09-24 DIAGNOSIS — R5382 Chronic fatigue, unspecified: Secondary | ICD-10-CM | POA: Diagnosis not present

## 2023-09-29 DIAGNOSIS — F419 Anxiety disorder, unspecified: Secondary | ICD-10-CM | POA: Diagnosis not present

## 2023-09-29 DIAGNOSIS — Z630 Problems in relationship with spouse or partner: Secondary | ICD-10-CM | POA: Diagnosis not present

## 2023-10-01 ENCOUNTER — Other Ambulatory Visit (HOSPITAL_COMMUNITY): Payer: Self-pay

## 2023-10-03 DIAGNOSIS — J4550 Severe persistent asthma, uncomplicated: Secondary | ICD-10-CM | POA: Diagnosis not present

## 2023-10-03 DIAGNOSIS — D806 Antibody deficiency with near-normal immunoglobulins or with hyperimmunoglobulinemia: Secondary | ICD-10-CM | POA: Diagnosis not present

## 2023-10-10 ENCOUNTER — Encounter: Payer: Self-pay | Admitting: Family Medicine

## 2023-10-10 ENCOUNTER — Ambulatory Visit (INDEPENDENT_AMBULATORY_CARE_PROVIDER_SITE_OTHER): Payer: BC Managed Care – PPO | Admitting: Family Medicine

## 2023-10-10 VITALS — BP 124/72 | HR 87 | Temp 98.3°F | Ht 68.0 in | Wt 300.2 lb

## 2023-10-10 DIAGNOSIS — G90A Postural orthostatic tachycardia syndrome (POTS): Secondary | ICD-10-CM | POA: Diagnosis not present

## 2023-10-10 DIAGNOSIS — J3089 Other allergic rhinitis: Secondary | ICD-10-CM | POA: Diagnosis not present

## 2023-10-10 DIAGNOSIS — J4540 Moderate persistent asthma, uncomplicated: Secondary | ICD-10-CM | POA: Diagnosis not present

## 2023-10-10 DIAGNOSIS — J302 Other seasonal allergic rhinitis: Secondary | ICD-10-CM

## 2023-10-10 DIAGNOSIS — D806 Antibody deficiency with near-normal immunoglobulins or with hyperimmunoglobulinemia: Secondary | ICD-10-CM

## 2023-10-10 DIAGNOSIS — I10 Essential (primary) hypertension: Secondary | ICD-10-CM | POA: Diagnosis not present

## 2023-10-10 NOTE — Assessment & Plan Note (Signed)
 Continue working with Dr. Lydia Sams.

## 2023-10-10 NOTE — Progress Notes (Signed)
 [facility] [address] [place] [address] Dept: [phone] Dept Fax: [phone]  Chronic Care Office Visit  Subjective:    Patient ID: Nicole Mendoza, female    DOB: 31-Aug-1990, 33 y.o..   MRN: [id number]  Chief Complaint  Patient presents with   Hypertension    2 month f/u.  C/o having feeling fatigue.     History of Present Illness:  Patient is in today for reassessment of chronic medical issues.  Ms. Nicole Mendoza has a history of hypertension. She is on metoprolol  succinate 100 mg daily, but this was primarily prescribed for dealing with tachycardia issues that she developed in her early 3s. She is taking 1/2 tab of metoprolol  tartrate 50 mg at night when needed for the tachycardia. It is felt this is likely due to POTS disease.    Ms. Nicole Mendoza has moderate persistent asthma and sees Dr. Idolina Mendoza (allergist) related to this. She is treated with a combination of therapies for her allergies and asthma, including Xopenox (inhaler and nebs), Asmanex , Atrovent , and Singulair . She also occasionally uses a budesonide  neb. For her allergies, she also uses cetirizine, and a fluticasone  nasal spray (Xhance ). She has PRN hydroxyzine  for itching and occasional anxiety. She notes her allergies and asthma have been doing well more recently, despite the spring pollen season.   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 She is now engaged with Dr. Lydia Mendoza (immunology) at [facility]. She is receiving IVIG every 3 months.   Past Medical History: Patient Active Problem List   Diagnosis Date Noted   POTS (postural orthostatic tachycardia syndrome) 08/22/2023   Postviral fatigue syndrome 08/13/2023   Acute non-recurrent maxillary sinusitis 06/19/2023   Elevated TSH 06/19/2023   NAFLD (nonalcoholic fatty liver disease) 69/62/9528   Other fatigue 11/25/2022   Prediabetes 10/15/2022   Eating disorder 10/15/2022   BMI 45.0-49.9, adult (HCC) Current BMI  45.2 10/15/2022   Adjustment disorder with anxiety 06/12/2022   Vocal cord dysfunction 06/03/2022   Positive ANA (antinuclear antibody) 07/13/2021   Fibromyalgia 07/13/2021   Dysfunction of left eustachian tube 06/26/2021   Vertigo 06/26/2021   Obstructive sleep apnea 02/26/2021   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   Recurrent infections 12/20/2020   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 11/22/2020   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   Recurrent sinusitis 11/09/2020   Pre-nodular edema of the vocal folds 11/09/2020   ASCUS of cervix with negative high risk HPV 07/10/2020   Cardiac murmur 09/02/2019   Tachycardia 09/01/2019   Salivary stone 09/01/2019   Anaphylactic shock due to adverse food reaction 01/22/2019   Seasonal and perennial allergic rhinitis 01/22/2019   Morbid obesity (HCC) 10/28/2017   Moderate persistent asthma without complication 07/29/2017   Essential hypertension 04/22/2017   Dysmenorrhea 06/26/2016   Menorrhagia with regular cycle 06/26/2016   Gastroesophageal reflux disease 12/22/2015   Vitamin D  deficiency 11/02/2014   Past Surgical History:  Procedure Laterality Date   NO PAST SURGERIES     Family History  Adopted: Yes  Problem Relation Age of Onset   Asthma Mother    Diabetes Mother    High blood pressure Mother    Heart disease Mother    Bipolar disorder Mother    Sleep apnea Mother    Obesity Mother    Allergic rhinitis Father    Asthma Father  Diabetes Father    High blood pressure Father    Hyperlipidemia Father    Obesity Father    Allergic rhinitis Sister    Asthma Sister    Angioedema Neg Hx    Atopy Neg Hx    Eczema Neg Hx    Immunodeficiency Neg Hx    Urticaria Neg Hx    Outpatient Medications Prior to Visit  Medication Sig Dispense Refill   Ascorbic Acid (SUPER C COMPLEX PO)       budesonide  (PULMICORT ) 0.25 MG/2ML nebulizer solution Take 2 mLs (0.25 mg total) by nebulization 2 (two) times daily as needed. 120 mL 12   cetirizine (ZYRTEC) 10 MG tablet Take 10 mg by mouth daily.     Cholecalciferol  100 MCG (4000 UT) CAPS Take 1 capsule (4,000 Units total) by mouth daily. 30 capsule 0   dexlansoprazole  (DEXILANT ) 60 MG capsule Take 1 capsule (60 mg total) by mouth daily. 90 capsule 3   EPINEPHrine  0.3 mg/0.3 mL IJ SOAJ injection      famotidine  (PEPCID ) 40 MG tablet Take 1 tablet (40 mg total) by mouth daily. (Patient taking differently: Take 40 mg by mouth as needed.) 90 tablet 2   ferrous sulfate 325 (65 FE) MG tablet 3 (three) times a week.      fluticasone  (FLONASE ) 50 MCG/ACT nasal spray Place 2 sprays into both nostrils daily. 16 g 5   Fluticasone  Propionate (XHANCE ) 93 MCG/ACT EXHU Place 2 sprays into the nose 2 (two) times daily as needed. 32 mL 11   hydrOXYzine  (ATARAX ) 25 MG tablet Take 1 tablet (25 mg total) by mouth every 6 (six) hours as needed for anxiety (allergies). 30 tablet 1   ipratropium (ATROVENT ) 0.02 % nebulizer solution Take 1.25 mLs (0.25 mg total) by nebulization 4 (four) times daily. Mix 0.5 vial in with levalbuterol  nebulizer and can use every 6 hours as needed for shortness of breath and wheezing. (Patient taking differently: Take 0.25 mg by nebulization as needed. Mix 0.5 vial in with levalbuterol  nebulizer and can use every 6 hours as needed for shortness of breath and wheezing.) 75 mL 3   levalbuterol  (XOPENEX  HFA) 45 MCG/ACT inhaler Inhale 2 puffs into the lungs every 6 (six) hours as needed. 15 g 0   levalbuterol  (XOPENEX ) 1.25 MG/3ML nebulizer solution USE 1 VIAL BY NEBULIZATION EVERY 6 (SIX) HOURS AS NEEDED FOR WHEEZING. 540 mL 1   Levonorgestrel-Ethinyl Estradiol (AMETHIA) 0.1-0.02 & 0.01 MG tablet Take 1 tablet by mouth daily.     metoprolol  succinate (TOPROL -XL) 100 MG 24 hr tablet TAKE 1 TABLET BY MOUTH EVERY DAY WITH OR IMMEDIATELY FOLLOWING  A MEAL 90 tablet 3   metoprolol  tartrate (LOPRESSOR ) 50 MG tablet TAKE 1 TABLET (50 MG TOTAL) BY MOUTH AS NEEDED (FOR PALPITATIONS). 90 tablet 2   mometasone  (ASMANEX , 120 METERED DOSES,) 220 MCG/ACT inhaler Inhale 2 puffs into the lungs 2 (two) times daily. 1 each 5   montelukast  (SINGULAIR ) 10 MG tablet Take 1 tablet (10 mg total) by mouth daily. 90 tablet 1   ondansetron  (ZOFRAN ) 4 MG tablet Take 1 tablet (4 mg total) by mouth every 8 (eight) hours as needed for nausea or vomiting. 20 tablet 0   Prenatal Vit-Fe Fumarate-FA (MULTIVITAMIN-PRENATAL) 27-0.8 MG TABS tablet Take 1 tablet by mouth daily at 12 noon.     Probiotic Product (PHILLIPS COLON HEALTH) CAPS      sucralfate  (CARAFATE ) 1 g tablet Take 1 tablet (1 g total) by mouth 4 (four) times  daily -  with meals and at bedtime. 120 tablet 0   fluticasone  (FLOVENT  HFA) 220 MCG/ACT inhaler Inhale 2 puffs into the lungs 2 (two) times daily. 1 each 5   pantoprazole  (PROTONIX ) 40 MG tablet TAKE 1 TABLET BY MOUTH TWICE A DAY 180 tablet 1   azelastine  (ASTELIN ) 0.1 % nasal spray PLACE 2 SPRAYS INTO BOTH NOSTRILS 2 (TWO) TIMES DAILY. USE IN EACH NOSTRIL AS DIRECTED     Bepotastine  Besilate 1.5 % SOLN Place 1 drop into both eyes in the morning and at bedtime. (Patient not taking: Reported on 10/10/2023) 10 mL 5   Fluticasone  Propionate (XHANCE ) 93 MCG/ACT EXHU Place 2 sprays into the nose 2 (two) times daily as needed.     ipratropium (ATROVENT ) 0.02 % nebulizer solution Take 2.5 mLs (0.5 mg total) by nebulization 4 (four) times daily. 75 mL 12   potassium chloride  SA (KLOR-CON  M) 20 MEQ tablet Take 1 tablet (20 mEq total) by mouth daily. 3 tablet 0   No facility-administered medications prior to visit.   Allergies  Allergen Reactions   Budesonide -Formoterol  Fumarate Other (See Comments)    Causes Hypertension  Causes Hypertension    Made blood pressure go up   Other     Tree nuts   Penicillins Hives   Raspberry Hives   Spiriva  Respimat  [Tiotropium Bromide Monohydrate ] Other (See Comments)    Pt states medication causes dizziness   Albuterol Palpitations   Losartan Rash   Objective:   Today's Vitals   10/10/23 0919  BP: 124/72  Pulse: 87  Temp: 98.3 F (36.8 C)  TempSrc: Temporal  SpO2: 98%  Weight: (!) 300 lb 3.2 oz (136.2 kg)  Height: 5\' 8"  (1.727 m)   Body mass index is 45.65 kg/m.   General: Well developed, well nourished. No acute distress. Lungs: Clear to auscultation bilaterally. No wheezing, rales or rhonchi. Psych: Alert and oriented. Normal mood and affect.  Health Maintenance Due  Topic Date Due   HIV Screening  Never done   Hepatitis C Screening  Never done   Pneumococcal Vaccine 67-41 Years old (2 of 2 - PCV) 05/13/2019     Assessment & Plan:   Problem List Items Addressed This Visit       Cardiovascular and Mediastinum   Essential hypertension - Primary   BP is at goal. Continue metoprolol  succinate 100 mg daily.      POTS (postural orthostatic tachycardia syndrome)   Tachycardia has been more frequent in past several months. Continue metoprolol  succinate 100 mg daily, and 1/2 tab of metoprolol  tartrate 50 mg at night PRN.        Respiratory   Moderate persistent asthma without complication   Well-controlled. Continue montelukast  10 mg daily, fluticasone  propionate (Xhance ) daily, and budesonide  nebs, ipratropium nebs and levalbuterol  PRN.      Seasonal and perennial allergic rhinitis   Stable. Continue cetirizine 10 mg daily, montelukast  10 mg daily and a fluticasone  nasal spray (Xhance ).        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 Lydia Mendoza.       Return in about 4 months (around 02/09/2024) for Reassessment.   Graig Lawyer, MD

## 2023-10-10 NOTE — Assessment & Plan Note (Signed)
 Tachycardia has been more frequent in past several months. Continue metoprolol  succinate 100 mg daily, and 1/2 tab of metoprolol  tartrate 50 mg at night PRN.

## 2023-10-10 NOTE — Assessment & Plan Note (Signed)
 BP is at goal. Continue metoprolol  succinate 100 mg daily.

## 2023-10-10 NOTE — Assessment & Plan Note (Signed)
 Stable. Continue cetirizine 10 mg daily, montelukast  10 mg daily and a fluticasone  nasal spray (Xhance ).

## 2023-10-10 NOTE — Assessment & Plan Note (Signed)
 Well-controlled. Continue montelukast  10 mg daily, fluticasone  propionate (Xhance ) daily, and budesonide  nebs, ipratropium nebs and levalbuterol  PRN.

## 2023-10-14 ENCOUNTER — Other Ambulatory Visit: Payer: Self-pay | Admitting: Neurology

## 2023-10-14 DIAGNOSIS — G4719 Other hypersomnia: Secondary | ICD-10-CM

## 2023-10-14 DIAGNOSIS — D806 Antibody deficiency with near-normal immunoglobulins or with hyperimmunoglobulinemia: Secondary | ICD-10-CM | POA: Diagnosis not present

## 2023-10-15 NOTE — Progress Notes (Signed)
 Nicole Mendoza

## 2023-10-16 ENCOUNTER — Encounter: Payer: Self-pay | Admitting: Neurology

## 2023-10-16 ENCOUNTER — Ambulatory Visit (INDEPENDENT_AMBULATORY_CARE_PROVIDER_SITE_OTHER): Admitting: Neurology

## 2023-10-16 ENCOUNTER — Other Ambulatory Visit: Payer: Self-pay | Admitting: Allergy & Immunology

## 2023-10-16 DIAGNOSIS — R5382 Chronic fatigue, unspecified: Secondary | ICD-10-CM | POA: Diagnosis not present

## 2023-10-16 DIAGNOSIS — M797 Fibromyalgia: Secondary | ICD-10-CM | POA: Diagnosis not present

## 2023-10-16 DIAGNOSIS — U099 Post covid-19 condition, unspecified: Secondary | ICD-10-CM | POA: Diagnosis not present

## 2023-10-16 DIAGNOSIS — R000 Tachycardia, unspecified: Secondary | ICD-10-CM

## 2023-10-16 DIAGNOSIS — G4733 Obstructive sleep apnea (adult) (pediatric): Secondary | ICD-10-CM

## 2023-10-16 MED ORDER — MODAFINIL 200 MG PO TABS: 100.0000 mg | ORAL_TABLET | Freq: Every day | ORAL | 2 refills | Status: DC

## 2023-10-16 NOTE — Patient Instructions (Addendum)
 Modafinil  Tablets What is this medication?Fatigue If you have fatigue, you feel tired all the time and have a lack of energy or a lack of motivation. Fatigue may make it difficult to start or complete tasks because of exhaustion. Occasional or mild fatigue is often a normal response to activity or life. However, long-term (chronic) or extreme fatigue may be a symptom of a medical condition such as: Depression. Not having enough red blood cells or hemoglobin in the blood (anemia). A problem with a small gland located in the lower front part of the neck (thyroid disorder). Rheumatologic conditions. These are problems related to the body's defense system (immune system). Infections, especially certain viral infections. Fatigue can also lead to negative health outcomes over time. Follow these instructions at home: Medicines Take over-the-counter and prescription medicines only as told by your health care provider. Take a multivitamin if told by your health care provider. Do not use herbal or dietary supplements unless they are approved by your health care provider. Eating and drinking  Avoid heavy meals in the evening. Eat a well-balanced diet, which includes lean proteins, whole grains, plenty of fruits and vegetables, and low-fat dairy products. Avoid eating or drinking too many products with caffeine in them. Avoid alcohol. Drink enough fluid to keep your urine pale yellow. Activity  Exercise regularly, as told by your health care provider. Use or practice techniques to help you relax, such as yoga, tai chi, meditation, or massage therapy. Lifestyle Change situations that cause you stress. Try to keep your work and personal schedules in balance. Do not use recreational or illegal drugs. General instructions Monitor your fatigue for any changes. Go to bed and get up at the same time every day. Avoid fatigue by pacing yourself during the day and getting enough sleep at night. Maintain a  healthy weight. Contact a health care provider if: Your fatigue does not get better. You have a fever. You suddenly lose or gain weight. You have headaches. You have trouble falling asleep or sleeping through the night. You feel angry, guilty, anxious, or sad. You have swelling in your legs or another part of your body. Get help right away if: You feel confused, feel like you might faint, or faint. Your vision is blurry or you have a severe headache. You have severe pain in your abdomen, your back, or the area between your waist and hips (pelvis). You have chest pain, shortness of breath, or an irregular or fast heartbeat. You are unable to urinate, or you urinate less than normal. You have abnormal bleeding from the rectum, nose, lungs, nipples, or, if you are female, the vagina. You vomit blood. You have thoughts about hurting yourself or others. These symptoms may be an emergency. Get help right away. Call 911. Do not wait to see if the symptoms will go away. Do not drive yourself to the hospital. Get help right away if you feel like you may hurt yourself or others, or have thoughts about taking your own life. Go to your nearest emergency room or: Call 911. Call the National Suicide Prevention Lifeline at (579)338-5443 or 988. This is open 24 hours a day. Text the Crisis Text Line at 559-418-5465. Summary If you have fatigue, you feel tired all the time and have a lack of energy or a lack of motivation. Fatigue may make it difficult to start or complete tasks because of exhaustion. Long-term (chronic) or extreme fatigue may be a symptom of a medical condition. Exercise regularly, as told  by your health care provider. Change situations that cause you stress. Try to keep your work and personal schedules in balance. This information is not intended to replace advice given to you by your health care provider. Make sure you discuss any questions you have with your health care  provider. Document Revised: 03/26/2021 Document Reviewed: 03/26/2021 Elsevier Patient Education  2024 Elsevier Inc. MODAFINIL  (moe DAF i nil) treats sleep disorders, such as narcolepsy, obstructive sleep apnea, and shift work disorder. It works by promoting wakefulness. It belongs to a group of medications called stimulants. This medicine may be used for other purposes; ask your health care provider or pharmacist if you have questions. COMMON BRAND NAME(S): Provigil  What should I tell my care team before I take this medication? They need to know if you have any of these conditions: Kidney disease Liver disease Mental health conditions An unusual or allergic reaction to modafinil , other medications, foods, dyes, or preservatives Pregnant or trying to get pregnant Breast-feeding How should I use this medication? Take this medication by mouth with water. Take it as directed on the prescription label at the same time every day. You can take it with or without food. If it upsets your stomach, take it with food. Keep taking it unless your care team tells you to stop. A special MedGuide will be given to you by the pharmacist with each prescription and refill. Be sure to read this information carefully each time. Talk to your care team about the use of this medication in children. Special care may be needed. Overdosage: If you think you have taken too much of this medicine contact a poison control center or emergency room at once. NOTE: This medicine is only for you. Do not share this medicine with others. What if I miss a dose? If you miss a dose, take it as soon as you can. If it is almost time for your next dose, take only that dose. Do not take double or extra doses. What may interact with this medication? Do not take this medication with any of the following: Amphetamine or dextroamphetamine Dexmethylphenidate or methylphenidate MAOIs, such as Marplan, Nardil, and  Parnate Pemoline Procarbazine This medication may also interact with the following: Antifungal medications, such as itraconazole or ketoconazole Barbiturates, such as phenobarbital Carbamazepine  Cyclosporine Diazepam Estrogen or progestin hormones Medications for mental health conditions Phenytoin Propranolol Triazolam Warfarin This list may not describe all possible interactions. Give your health care provider a list of all the medicines, herbs, non-prescription drugs, or dietary supplements you use. Also tell them if you smoke, drink alcohol, or use illegal drugs. Some items may interact with your medicine. What should I watch for while using this medication? Visit your care team for regular checks on your progress. It may be some time before you see the benefit from this medication. This medication may affect your coordination, reaction time, or judgment. Do not drive or operate machinery until you know how this medication affects you. Sit up or stand slowly to reduce the risk of dizzy or fainting spells. Drinking alcohol with this medication can increase the risk of these side effects. This medication may cause serious skin reactions. They can happen weeks to months after starting the medication. Contact your care team right away if you notice fevers or flu-like symptoms with a rash. The rash may be red or purple and then turn into blisters or peeling of the skin. You may also notice a red rash with swelling of the face, lips,  or lymph nodes in your neck or under your arms. Estrogen and progestin hormones may not work as well while you are taking this medication. Your care team can help you find the contraceptive option that works for you. It is unknown if the effects of this medication will be increased by the use of caffeine. Caffeine is found in many foods, beverages, and medications. Ask your care team if you should limit or change your intake of caffeine-containing products while on  this medication. What side effects may I notice from receiving this medication? Side effects that you should report to your care team as soon as possible: Allergic reactions or angioedema--skin rash, itching or hives, swelling of the face, eyes, lips, tongue, arms, or legs, trouble swallowing or breathing Increase in blood pressure Mood and behavior changes--anxiety, nervousness, confusion, hallucinations, irritability, hostility, thoughts of suicide or self-harm, worsening mood, feelings of depression Rash, fever, and swollen lymph nodes Redness, blistering, peeling, or loosening of the skin, including inside the mouth Side effects that usually do not require medical attention (report to your care team if they continue or are bothersome): Anxiety, nervousness Dizziness Headache Nausea Trouble sleeping This list may not describe all possible side effects. Call your doctor for medical advice about side effects. You may report side effects to FDA at 1-800-FDA-1088. Where should I keep my medication? Keep out of the reach of children and pets. This medication can be abused. Keep it in a safe place to protect it from theft. Do not share it with anyone. It is only for you. Selling or giving away this medication is dangerous and against the law. Store at room temperature between 20 and 25 degrees C (68 and 77 degrees F). Get rid of any unused medication after the expiration date. This medication may cause harm and death if it is taken by other adults, children, or pets. It is important to get rid of the medication as soon as you no longer need it or it is expired. You can do this in two ways: Take the medication to a medication take-back program. Check with your pharmacy or law enforcement to find a location. If you cannot return the medication, check the label or package insert to see if the medication should be thrown out in the garbage or flushed down the toilet. If you are not sure, ask your care  team. If it is safe to put it in the trash, take the medication out of the container. Mix the medication with cat litter, dirt, coffee grounds, or other unwanted substance. Seal the mixture in a bag or container. Put it in the trash. NOTE: This sheet is a summary. It may not cover all possible information. If you have questions about this medicine, talk to your doctor, pharmacist, or health care provider.  2024 Elsevier/Gold Standard (2022-07-12 00:00:00)

## 2023-10-16 NOTE — Progress Notes (Signed)
 Provider:  Neomia Banner, MD  Primary Care Physician:  Graig Lawyer, MD [address] [place] [address]     Referring Provider:       Dr Lydia Sams , MD immunology at [facility] and Dr Idolina Maker, MD [facility]  and asthma.   Chief Complaint according to patient   Patient presents with:                HISTORY OF PRESENT ILLNESS:  Nicole Mendoza is a 33 y.o. female patient who is here for revisit 10/16/2023 for  Fatigue.  She had covid, Influenza A and strep throat this winter , has had a hard to recover.   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  She is not responding to vaccines.  Initially she felt much more energy after IVIG was started, until she contracted the above named infections.   She was preventatively on Levoquin, 7 days , did not get pneumonia.   Here with 100% Compliance on CPAP and with low residual AHI and low degree of sleepiness- but high degree of fatigue Chief concern according to patient :  What can be done for my fatigue since OSA is well controlled?  I can hardly wake up in the morning, sleep inertia. Thyroid TSH was high.  Will repeat this level today.   Saw me last in 2021 for a recent anosmia onset  after COVID ; Dysosmia, anosmia, ageusia.  Mostly sour taste - sour pickles, olives etc are the most distorted taste sensations.  she recovered chocolate, mint and menthol. She is not sure about grapefruit taste .  She cannot like peppers anymore, banana peppers, green peppers,    Sweet works fine, taste is preserved. Salty taste is preserved.    Abnormal smell of smoke- nauseating-  Review of Systems: Out of a complete 14 system review, the patient complains of only the following symptoms, and all other reviewed systems are negative.:    No sleep attacks, no vivid dreams, no sleep paralysis since being on CPAP .   How likely are you to doze in the following situations: 0 = not likely, 1  = slight chance, 2 = moderate chance, 3 = high chance  Sitting and Reading? Watching Television? Sitting inactive in a public place (theater or meeting)? Lying down in the afternoon when circumstances permit? Sitting and talking to someone? Sitting quietly after lunch without alcohol? In a car, while stopped for a few minutes in traffic? As a passenger in a car for an hour without a break?  Total = 6   FSS at 57/ 63 severe.   Social History   Socioeconomic History   Marital status: Married    Spouse name: Nicole Mendoza   Number of children: 0   Years of education: Not on file   Highest education level: Not on file  Occupational History   Occupation: Hospice RN  Tobacco Use   Smoking status: Never   Smokeless tobacco: Never  Vaping Use   Vaping status: Never Used  Substance and Sexual Activity   Alcohol use: No    Alcohol/week: 0.0 standard drinks of alcohol   Drug use: No   Sexual activity: Yes    Birth control/protection: Pill  Other Topics Concern   Not on file  Social History Narrative   Not on file   Social Drivers of Health   Financial Resource Strain: Not on  file  Food Insecurity: Not on file  Transportation Needs: Not on file  Physical Activity: Not on file  Stress: Not on file  Social Connections: Unknown (10/16/2022)   Received from [employer], [facility]   Social Network    Social Network: Not on file    Family History  Adopted: Yes  Problem Relation Age of Onset   Asthma Mother    Diabetes Mother    High blood pressure Mother    Heart disease Mother    Bipolar disorder Mother    Sleep apnea Mother    Obesity Mother    Allergic rhinitis Father    Asthma Father    Diabetes Father    High blood pressure Father    Hyperlipidemia Father    Obesity Father    Allergic rhinitis Sister    Asthma Sister    Angioedema Neg Hx    Atopy Neg Hx    Eczema Neg Hx    Immunodeficiency Neg Hx    Urticaria Neg Hx     Past Medical History:  Diagnosis  Date   Allergic rhinitis    Allergy     Angio-edema    Asthma    Dysmenorrhea    Fatty liver    GERD (gastroesophageal reflux disease)    Hypertension    Recurrent upper respiratory infection (URI)    Sleep apnea    Tachycardia     Past Surgical History:  Procedure Laterality Date   NO PAST SURGERIES       Current Outpatient Medications on File Prior to Visit  Medication Sig Dispense Refill   Ascorbic Acid (SUPER C COMPLEX PO)      budesonide  (PULMICORT ) 0.25 MG/2ML nebulizer solution Take 2 mLs (0.25 mg total) by nebulization 2 (two) times daily as needed. 120 mL 12   cetirizine (ZYRTEC) 10 MG tablet Take 10 mg by mouth daily.     Cholecalciferol  100 MCG (4000 UT) CAPS Take 1 capsule (4,000 Units total) by mouth daily. 30 capsule 0   dexlansoprazole  (DEXILANT ) 60 MG capsule Take 1 capsule (60 mg total) by mouth daily. 90 capsule 3   EPINEPHrine  0.3 mg/0.3 mL IJ SOAJ injection      ferrous sulfate 325 (65 FE) MG tablet 3 (three) times a week.      fluticasone  (FLONASE ) 50 MCG/ACT nasal spray Place 2 sprays into both nostrils daily. 16 g 5   Fluticasone  Propionate (XHANCE ) 93 MCG/ACT EXHU Place 2 sprays into the nose 2 (two) times daily as needed. 32 mL 11   hydrOXYzine  (ATARAX ) 25 MG tablet Take 1 tablet (25 mg total) by mouth every 6 (six) hours as needed for anxiety (allergies). 30 tablet 1   ipratropium (ATROVENT ) 0.02 % nebulizer solution Take 1.25 mLs (0.25 mg total) by nebulization 4 (four) times daily. Mix 0.5 vial in with levalbuterol  nebulizer and can use every 6 hours as needed for shortness of breath and wheezing. (Patient taking differently: Take 0.25 mg by nebulization as needed. Mix 0.5 vial in with levalbuterol  nebulizer and can use every 6 hours as needed for shortness of breath and wheezing.) 75 mL 3   levalbuterol  (XOPENEX ) 1.25 MG/3ML nebulizer solution USE 1 VIAL BY NEBULIZATION EVERY 6 (SIX) HOURS AS NEEDED FOR WHEEZING. 540 mL 1   Levonorgestrel-Ethinyl Estradiol  (AMETHIA) 0.1-0.02 & 0.01 MG tablet Take 1 tablet by mouth daily.     metoprolol  succinate (TOPROL -XL) 100 MG 24 hr tablet TAKE 1 TABLET BY MOUTH EVERY DAY WITH OR IMMEDIATELY  FOLLOWING A MEAL 90 tablet 3   metoprolol  tartrate (LOPRESSOR ) 50 MG tablet TAKE 1 TABLET (50 MG TOTAL) BY MOUTH AS NEEDED (FOR PALPITATIONS). 90 tablet 2   mometasone  (ASMANEX , 120 METERED DOSES,) 220 MCG/ACT inhaler Inhale 2 puffs into the lungs 2 (two) times daily. 1 each 5   montelukast  (SINGULAIR ) 10 MG tablet Take 1 tablet (10 mg total) by mouth daily. 90 tablet 1   ondansetron  (ZOFRAN ) 4 MG tablet Take 1 tablet (4 mg total) by mouth every 8 (eight) hours as needed for nausea or vomiting. 20 tablet 0   Prenatal Vit-Fe Fumarate-FA (MULTIVITAMIN-PRENATAL) 27-0.8 MG TABS tablet Take 1 tablet by mouth daily at 12 noon.     Probiotic Product (PHILLIPS COLON HEALTH) CAPS      famotidine  (PEPCID ) 40 MG tablet Take 1 tablet (40 mg total) by mouth daily. (Patient taking differently: Take 40 mg by mouth as needed.) 90 tablet 2   sucralfate  (CARAFATE ) 1 g tablet Take 1 tablet (1 g total) by mouth 4 (four) times daily -  with meals and at bedtime. 120 tablet 0   No current facility-administered medications on file prior to visit.    Allergies  Allergen Reactions   Budesonide -Formoterol  Fumarate Other (See Comments)    Causes Hypertension  Causes Hypertension    Made blood pressure go up   Other     Tree nuts   Penicillins Hives   Raspberry Hives   Spiriva  Respimat [Tiotropium Bromide Monohydrate ] Other (See Comments)    Pt states medication causes dizziness   Albuterol Palpitations   Losartan Rash     DIAGNOSTIC DATA (LABS, IMAGING, TESTING) - I reviewed patient records, labs, notes, testing and imaging myself where available.  Lab Results  Component Value Date   WBC 11.9 (H) 08/10/2023   HGB 13.3 08/10/2023   HCT 39.4 08/10/2023   MCV 81.7 08/10/2023   PLT 285 08/10/2023      Component Value Date/Time    NA 136 08/10/2023 2216   NA 139 06/17/2023 1235   K 3.5 08/10/2023 2216   CL 106 08/10/2023 2216   CO2 20 (L) 08/10/2023 2216   GLUCOSE 102 (H) 08/10/2023 2216   BUN 9 08/10/2023 2216   BUN 14 06/17/2023 1235   CREATININE 0.66 08/10/2023 2216   CREATININE 0.69 05/09/2021 1415   CALCIUM 9.1 08/10/2023 2216   PROT 7.6 06/17/2023 1235   ALBUMIN 4.3 06/17/2023 1235   AST 31 06/17/2023 1235   ALT 40 (H) 06/17/2023 1235   ALKPHOS 95 06/17/2023 1235   BILITOT 0.4 06/17/2023 1235   GFRNONAA >60 08/10/2023 2216   GFRAA >60 03/13/2020 1234   Lab Results  Component Value Date   CHOL 137 11/25/2022   HDL 59 11/25/2022   LDLCALC 60 11/25/2022   TRIG 97 11/25/2022   Lab Results  Component Value Date   HGBA1C 5.7 (H) 11/25/2022   Lab Results  Component Value Date   VITAMINB12 502 11/25/2022   Lab Results  Component Value Date   TSH 4.600 (H) 11/25/2022    PHYSICAL EXAM:  Today's Vitals   10/16/23 1341  BP: (!) 138/90  Pulse: 98  Weight: (!) 302 lb 12.8 oz (137.3 kg)  Height: 5\' 8"  (1.727 m)   Body mass index is 46.04 kg/m.   Wt Readings from Last 3 Encounters:  10/16/23 (!) 302 lb 12.8 oz (137.3 kg)  10/10/23 (!) 300 lb 3.2 oz (136.2 kg)  08/29/23 296 lb 3.2 oz (134.4 kg)  Ht Readings from Last 3 Encounters:  10/16/23 5\' 8"  (1.727 m)  10/10/23 5\' 8"  (1.727 m)  08/29/23 5\' 8"  (1.727 m)      General: The patient is awake, alert and appears not in acute distress. The patient is well groomed. Head: Normocephalic, atraumatic. Neck is supple. Mallampati 3,  neck circumference: 21 inches . Nasal airflow  patent.   Underbite  with TMJ pain on the left .  Dental status: biological  Cardiovascular:  Regular rate and cardiac rhythm by pulse,  without distended neck veins. Respiratory: Lungs are clear to auscultation.  Skin:  With evidence of ankle edema, or rash. Trunk: The patient's posture is erect.   NEUROLOGIC EXAM: The patient is awake and alert, oriented to  place and time.   Memory subjective described as intact.  Attention span & concentration ability appears normal.  Speech is fluent,  without  dysarthria, dysphonia or aphasia.  Mood and affect are appropriate.   Cranial nerves: no loss of smell or taste reported  Pupils are equal and briskly reactive to light. Funduscopic exam deferred.  Extraocular movements in vertical and horizontal planes were intact and without nystagmus. No Diplopia. Visual fields by finger perimetry are intact. Hearing was intact to soft voice and finger rubbing.    Facial sensation intact to fine touch.  Facial motor strength is symmetric and tongue and uvula move midline.  Neck ROM : rotation, tilt and flexion extension were normal for age and shoulder shrug was symmetrical.    Motor exam:  Symmetric bulk, tone and ROM.   Normal tone without cog -wheeling, symmetric grip strength .   Sensory:  Fine touch was  normal.  Proprioception tested in the upper extremities was normal.   Coordination: Rapid alternating movements in the fingers/hands were of normal speed.  The Finger-to-nose maneuver was intact without evidence of ataxia, dysmetria or tremor.   Gait and station: Patient could rise unassisted from a seated position, walked without assistive device.  Deep tendon reflexes: in the  upper and lower extremities are symmetric and intact.      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 Patient is considered immuno-compromised and received IVIG treatment. .   2) no longer EDS - she is not sleepy since OSA controlled on CPAP and is highly complaint.   3)  I wondered about her thyroid function an will repeat that test. I offered a trial of modafinil  as her OSA dx allows for this medication to be given.   I encouraged continued care with rheumatology and  immunology, cardiology.   I plan to follow up either personally or through our NP within 12 months for CPAP compliance .   I would like to thank  Graig Lawyer, Md [address] [place],  [place] [address] for allowing me to meet with and to take care of this pleasant patient.   After spending a total time of  35  minutes face to face and additional time for physical and neurologic examination, review of laboratory studies,  personal review of imaging studies, reports and results of other testing and review of referral information / records as far as provided in visit,   Electronically signed by: Neomia Banner, MD 10/16/2023 2:02 PM  [facility] Board certified by [employer]  of Sleep Medicine and Diplomate of the American Academy of Sleep Medicine. Board certified In Neurology through the ABPN, Fellow of the [employer] of Neu[employer].

## 2023-10-17 ENCOUNTER — Other Ambulatory Visit (HOSPITAL_COMMUNITY): Payer: Self-pay

## 2023-10-17 ENCOUNTER — Encounter: Payer: Self-pay | Admitting: Neurology

## 2023-10-17 ENCOUNTER — Telehealth: Payer: Self-pay

## 2023-10-17 LAB — THYROID PANEL WITH TSH
Free Thyroxine Index: 2.2 (ref 1.2–4.9)
T3 Uptake Ratio: 24 % (ref 24–39)
T4, Total: 9.1 ug/dL (ref 4.5–12.0)
TSH: 2.27 u[IU]/mL (ref 0.450–4.500)

## 2023-10-17 NOTE — Telephone Encounter (Signed)
 Pharmacy Patient Advocate Encounter   Received notification from CoverMyMeds that prior authorization for Modafinil  200MG  tablets is required/requested.   Insurance verification completed.   The patient is insured through [facility] .   Per test claim: PA required; PA submitted to above mentioned insurance via CoverMyMeds Key/confirmation #/EOC [id number] Status is pending

## 2023-10-20 ENCOUNTER — Other Ambulatory Visit (HOSPITAL_COMMUNITY): Payer: Self-pay

## 2023-10-20 DIAGNOSIS — Z880 Allergy status to penicillin: Secondary | ICD-10-CM | POA: Diagnosis not present

## 2023-10-20 DIAGNOSIS — D806 Antibody deficiency with near-normal immunoglobulins or with hyperimmunoglobulinemia: Secondary | ICD-10-CM | POA: Diagnosis not present

## 2023-10-20 NOTE — Telephone Encounter (Signed)
 Pharmacy Patient Advocate Encounter  Received notification from [facility] that Prior Authorization for Modafinil  200MG  tablets has been APPROVED from 10/17/2023 to 10/16/2024. Unable to obtain price due to refill too soon rejection, last fill date 10/18/2023 next available fill date5/26/2025   PA #/Case ID/Reference #: PA Case ID #: [id number]

## 2023-10-22 ENCOUNTER — Telehealth: Payer: Self-pay | Admitting: *Deleted

## 2023-10-22 ENCOUNTER — Encounter: Payer: Self-pay | Admitting: Allergy & Immunology

## 2023-10-22 DIAGNOSIS — J4550 Severe persistent asthma, uncomplicated: Secondary | ICD-10-CM | POA: Diagnosis not present

## 2023-10-22 DIAGNOSIS — D806 Antibody deficiency with near-normal immunoglobulins or with hyperimmunoglobulinemia: Secondary | ICD-10-CM | POA: Diagnosis not present

## 2023-10-22 NOTE — Telephone Encounter (Signed)
-----   Message from [place]ville Dohmeier sent at 10/17/2023 10:12 AM EDT ----- Extreme fatigue as chief complaint, patient is using CPAP for OSA and apnea is in excellent control: we rechecked on TSH which had been abnormally high last June 2024.:  TSH is back in normal range , no abnormalities in T4, T3, or free thyroxine Index.

## 2023-10-22 NOTE — Telephone Encounter (Signed)
LVM for patient to call back to discuss lab results.

## 2023-10-23 ENCOUNTER — Telehealth: Payer: Self-pay | Admitting: *Deleted

## 2023-10-23 NOTE — Telephone Encounter (Signed)
 Patient called about Dupixent I was not aware of the request to start auth until Dr patel relayed message. I have gotten approval and advised patient approval, copay card and submit to [employer] for same. Instructed on delivery, storage , dosing and initial injection in clinic

## 2023-10-24 NOTE — Telephone Encounter (Signed)
 Thanks, [name].

## 2023-10-28 DIAGNOSIS — G4733 Obstructive sleep apnea (adult) (pediatric): Secondary | ICD-10-CM | POA: Diagnosis not present

## 2023-11-04 ENCOUNTER — Encounter: Payer: Self-pay | Admitting: Allergy & Immunology

## 2023-11-04 ENCOUNTER — Other Ambulatory Visit: Payer: Self-pay

## 2023-11-04 ENCOUNTER — Ambulatory Visit: Payer: Self-pay | Admitting: Neurology

## 2023-11-04 ENCOUNTER — Ambulatory Visit (INDEPENDENT_AMBULATORY_CARE_PROVIDER_SITE_OTHER): Admitting: Allergy & Immunology

## 2023-11-04 VITALS — BP 124/82 | HR 89 | Temp 98.3°F | Resp 18 | Ht 68.0 in | Wt 306.2 lb

## 2023-11-04 DIAGNOSIS — J4540 Moderate persistent asthma, uncomplicated: Secondary | ICD-10-CM | POA: Diagnosis not present

## 2023-11-04 DIAGNOSIS — R5382 Chronic fatigue, unspecified: Secondary | ICD-10-CM

## 2023-11-04 DIAGNOSIS — G4733 Obstructive sleep apnea (adult) (pediatric): Secondary | ICD-10-CM

## 2023-11-04 DIAGNOSIS — K219 Gastro-esophageal reflux disease without esophagitis: Secondary | ICD-10-CM

## 2023-11-04 DIAGNOSIS — J3089 Other allergic rhinitis: Secondary | ICD-10-CM

## 2023-11-04 DIAGNOSIS — J4550 Severe persistent asthma, uncomplicated: Secondary | ICD-10-CM | POA: Diagnosis not present

## 2023-11-04 DIAGNOSIS — D806 Antibody deficiency with near-normal immunoglobulins or with hyperimmunoglobulinemia: Secondary | ICD-10-CM | POA: Diagnosis not present

## 2023-11-04 DIAGNOSIS — J383 Other diseases of vocal cords: Secondary | ICD-10-CM | POA: Diagnosis not present

## 2023-11-04 DIAGNOSIS — G4719 Other hypersomnia: Secondary | ICD-10-CM

## 2023-11-04 DIAGNOSIS — J302 Other seasonal allergic rhinitis: Secondary | ICD-10-CM

## 2023-11-04 NOTE — Progress Notes (Signed)
 FOLLOW UP  Date of Service/Encounter:  11/04/23   Assessment:   Mild persistent asthma, uncomplicated - well controlled at this point in time   Vocal cord dysfunction - followed by Dr. Miki Alert at [facility]   Recurrent infections - improved following Pneumovax, but then worsened (streptococcal avidity assay abnormal) - on Privigen  with a decrease in frequency of infections, although we are going to try to increase frequency to every 3 weeks   Seasonal and perennial allergic rhinitis (trees, weeds, grasses and dust mites) - not well controlled    Gastroesophageal reflux disease - on Dexilant  and famotidine  and PRN carafate     Adverse food reactions - resolved with minimizing exposure to food additives and avoiding peanuts/tree nuts   Snoring with perceived poor sleep quality - improved with use of the CPAP   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 - We are going to pursue Dupixent today.  - We will do the first couple here in the office.  - Daily controller medication(s): Singulair  10mg  daily and Asmanex  2 puffs twice daily  - Prior to physical activity: Xopenex  2 puffs 10-15 minutes before physical activity.  - Rescue medications: Xopenex  4 puffs every 4-6 hours as needed - Changes during respiratory infections or worsening symptoms: Add on Pulmicort  0.25mg  to one treatment twice daily for TWO WEEKS. - Asthma control goals:  * Full participation in all desired activities (may need albuterol before activity) * Albuterol use two time or less a week on average (not counting use with activity) * Cough interfering with sleep two time or less a month * Oral steroids no more than once a year * No hospitalizations  Allergic  rhinitis - Continue saline rinses several times a day - Continue cetirizine 10 mg once a day as needed for a runny nose or itch. - We will get Xhance  back on board. Medication Samples have been provided to the patient. - Continue with Astelin  two sprays per nostril twice daily in case this is related to postnasal drip.   Recurrent infections - We will continue with the Privagen every three weeks. - We could consider changing to Asceniv since you failed Hizentra  and Privagen.   Reflux - Continue dietary and lifestyle modifications as listed below - Continue with Dexilant  approved again with your insurance. - Continue with famotidine  40mg  nightly. - Continue with Carafate  as needed.   Vocal cord dysfunction - Continue with speech therapy at home lessons as you are doing.  - Continue with the belly breathing exercises. - I will send my note to Dr. Miki Alert.   Food allergy  - Continue to avoid tree nuts.  In case of an allergic reaction, take Benadryl 50 mg every 4 hours, and if life-threatening symptoms occur, inject with AuviQ 0.3 mg. - We will get that mixed tree nut challenge done at some point.   Return in about 4 months (around 03/06/2024). You can have the follow up appointment with Dr. Idolina Maker or a Nurse Practicioner (our Nurse Practitioners are excellent and always have Physician oversight!).    Subjective:   Nicole Mendoza is a 33 y.o. female presenting today for follow up of  Chief Complaint  Patient presents with   Follow-up    Patients states nothing has changed.     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   Acute non-recurrent maxillary sinusitis 06/19/2023   Elevated TSH 06/19/2023   NAFLD (nonalcoholic fatty liver disease) 16/03/9603   Other fatigue 11/25/2022   Prediabetes 10/15/2022   Eating disorder  10/15/2022   BMI 45.0-49.9, adult (HCC) Current BMI 45.2 10/15/2022   Adjustment disorder with anxiety 06/12/2022   Vocal cord dysfunction 06/03/2022   Positive ANA (antinuclear antibody) 07/13/2021   Fibromyalgia 07/13/2021   Dysfunction of left eustachian tube 06/26/2021   Vertigo 06/26/2021   Obstructive sleep apnea 02/26/2021   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   Recurrent infections 12/20/2020   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 11/22/2020   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   Recurrent sinusitis 11/09/2020   Pre-nodular edema of the vocal folds 11/09/2020   ASCUS of cervix with negative high risk HPV 07/10/2020   Cardiac murmur 09/02/2019   Tachycardia 09/01/2019   Salivary stone 09/01/2019   Anaphylactic shock due to adverse food reaction 01/22/2019   Seasonal and perennial allergic rhinitis 01/22/2019   Morbid obesity (HCC) 10/28/2017   Moderate persistent asthma without complication 07/29/2017   Essential hypertension 04/22/2017   Dysmenorrhea 06/26/2016   Menorrhagia with regular cycle 06/26/2016   Gastroesophageal reflux disease 12/22/2015   Vitamin D  deficiency 11/02/2014    History obtained from: chart review and patient.  Discussed the use of AI scribe software for clinical note transcription with the patient and/or guardian, who gave verbal consent to proceed.  Nicole Mendoza is a 33 y.o. female presenting for a follow up visit.  She was last seen in March 2025.  At that time, we signed consent for Dupixent.  We continue with Singulair  10 mg daily and Asmanex  2 puffs twice daily.  For her rhinitis, she is doing well on Zyrtec.  We did start Xhance  again.  We continue with Astelin  and added Bepreve eyedrops.  For the recurrent infections, she was doing well with Privigen  infusions every 3 weeks.  She continued to avoid tree nuts.   She was working with her speech therapist to address her vocal cord dysfunction.  Reflux was under good control with Dexilant  as well as famotidine  and Carafate .  Since last visit, she has done fairly well.    Asthma/Respiratory Symptom History: She experiences shortness of breath, particularly when walking from the parking lot. She regularly uses Asmanex  and Singulair  but has not been using Pulmicort  frequently. Despite experiencing shortness of breath, she does not use albuterol or Xopenex .  He is open to doing the Dupixent.  She has been communicating with [name] about that approval process.  She wants to get her first injection here in the office.  She is fine with injecting herself if she tolerates it in the office.    She has POTS with daily symptoms since a severe illness last winter. She manages symptoms with an abdominal binder three to four days a week and acknowledges inadequate hydration, which may contribute to her symptoms.  Allergic Rhinitis Symptom History: Her allergic rhinitis symptoms have remained stable without improvement or worsening. She is awaiting the start of Dupixent after receiving copay approval and delivery notification from [facility]. She has not yet received the medication.   GERD Symptom History: She experiences recurrent thrush  and has been taking Diflucan  twice a week for the past month. She suspects esophageal candidiasis and is considering a more extended course of treatment.  Infection Symptom History: She is currently on Privigen  infusions, which have become increasingly difficult due to challenging IV access, resulting in bruising and redness at the IV site. She has previously used Hizentra  and is considering other options due to these difficulties.  She does premedicate with 1 bag before and 1 bag after the infusion.  She really likes her nurse who visits her house to do the infusions for her.  Her nurse just watches TV while she does all of her infusions and  works.  She is going to do her penicillin challenge at some point in the future. She is very nervous about this.   Her family history includes a recent diagnosis of autism in her child, DJ, after persistent advocacy for testing. She is managing a complex family situation with multiple children, including a teenager who is moving to live with family soon.   Otherwise, there have been no changes to her past medical history, surgical history, family history, or social history.    Review of systems otherwise negative other than that mentioned in the HPI.    Objective:   Blood pressure 124/82, pulse 89, temperature 98.3 F (36.8 C), temperature source Temporal, resp. rate 18, height 5\' 8"  (1.727 m), weight (!) 306 lb 3.2 oz (138.9 kg), SpO2 100%. Body mass index is 46.56 kg/m.    Physical Exam Vitals reviewed.  Constitutional:      Appearance: She is well-developed. She is obese.     Comments: Very talkative.  Not ill-appearing.  Cooperative with the exam.  Seems fairly upbeat at this point.  HENT:     Head: Normocephalic and atraumatic.     Right Ear: Tympanic membrane, ear canal and external ear normal.     Left Ear: Tympanic membrane, ear canal and external ear normal.     Nose: Rhinorrhea present. No nasal deformity, septal deviation or mucosal edema.     Right Turbinates: Enlarged, swollen and pale.     Left Turbinates: Enlarged, swollen and pale.     Right Sinus: No maxillary sinus tenderness or frontal sinus tenderness.     Left Sinus: No maxillary sinus tenderness or frontal sinus tenderness.     Comments: No nasal polyps. Scant clear rhinorrhea.     Mouth/Throat:     Lips: Pink.     Mouth: Mucous membranes are not pale and not dry.     Pharynx: Uvula midline. No pharyngeal swelling or oropharyngeal exudate.     Tonsils: 2+ on the right. 2+ on the left.     Comments: Cobblestoning present in the posterior oropharynx.  Eyes:     General: Lids are normal. Allergic shiner  present.        Right eye: No discharge.        Left eye: No discharge.     Conjunctiva/sclera:     Right eye: Right conjunctiva is not injected. No chemosis.    Left eye: Left conjunctiva is not injected. No chemosis.    Pupils: Pupils are equal, round, and reactive to light.  Cardiovascular:     Rate and Rhythm: Normal rate and regular rhythm.     Heart sounds: Normal heart sounds.  Pulmonary:     Effort: Pulmonary effort is normal. No tachypnea, accessory muscle usage or respiratory distress.     Breath sounds: Normal breath sounds. No  wheezing, rhonchi or rales.     Comments: Moving air well in all lung fields.  No increased work of breathing. Chest:     Chest wall: No tenderness.  Lymphadenopathy:     Cervical: No cervical adenopathy.  Skin:    General: Skin is warm.     Capillary Refill: Capillary refill takes less than 2 seconds.     Coloration: Skin is not pale.     Findings: No abrasion, erythema, petechiae or rash. Rash is not papular, urticarial or vesicular.     Comments: No eczematous or urticarial lesions noted.  Neurological:     Mental Status: She is alert.  Psychiatric:        Behavior: Behavior is cooperative.      Diagnostic studies:    Spirometry: results normal (FEV1: 2.58/81%, FVC: 3.39/92%, FEV1/FVC: 76%).   Spirometry consistent with normal pattern.    Allergy  Studies: none        Drexel Gentles, MD  [facility] 

## 2023-11-04 NOTE — Patient Instructions (Addendum)
 Asthma - Lung testing looks better today. - We are going to pursue Dupixent today.  - We will do the first couple here in the office.  - Daily controller medication(s): Singulair  10mg  daily and Asmanex  2 puffs twice daily  - Prior to physical activity: Xopenex  2 puffs 10-15 minutes before physical activity.  - Rescue medications: Xopenex  4 puffs every 4-6 hours as needed - Changes during respiratory infections or worsening symptoms: Add on Pulmicort  0.25mg  to one treatment twice daily for TWO WEEKS. - Asthma control goals:  * Full participation in all desired activities (may need albuterol before activity) * Albuterol use two time or less a week on average (not counting use with activity) * Cough interfering with sleep two time or less a month * Oral steroids no more than once a year * No hospitalizations  Allergic rhinitis - Continue saline rinses several times a day - Continue cetirizine 10 mg once a day as needed for a runny nose or itch. - We will get Xhance  back on board. Medication Samples have been provided to the patient. - Continue with Astelin  two sprays per nostril twice daily in case this is related to postnasal drip.   Recurrent infections - We will continue with the Privagen every three weeks. - We could consider changing to Asceniv since you failed Hizentra  and Privagen.   Reflux - Continue dietary and lifestyle modifications as listed below - Continue with Dexilant  approved again with your insurance. - Continue with famotidine  40mg  nightly. - Continue with Carafate  as needed.   Vocal cord dysfunction - Continue with speech therapy at home lessons as you are doing.  - Continue with the belly breathing exercises. - I will send my note to Dr. Miki Alert.   Food allergy  - Continue to avoid tree nuts.  In case of an allergic reaction, take Benadryl 50 mg every 4 hours, and if life-threatening symptoms occur, inject with AuviQ 0.3 mg. - We will get that mixed tree nut  challenge done at some point.   Return in about 4 months (around 03/06/2024). You can have the follow up appointment with Dr. Idolina Maker or a Nurse Practicioner (our Nurse Practitioners are excellent and always have Physician oversight!).    Please inform us  of any Emergency Department visits, hospitalizations, or changes in symptoms. Call us  before going to the ED for breathing or allergy  symptoms since we might be able to fit you in for a sick visit. Feel free to contact us  anytime with any questions, problems, or concerns.  It was a pleasure to see you again today!  Websites that have reliable patient information: 1. American Academy of Asthma, Allergy , and Immunology: www.aaaai.org 2. Food Allergy  Research and Education (FARE): foodallergy.org 3. Mothers of Asthmatics: http://www.asthmacommunitynetwork.org 4. American College of Allergy , Asthma, and Immunology: www.acaai.org      "Like" us  on Facebook and Instagram for our latest updates!      A healthy democracy works best when [employer] participate! Make sure you are registered to vote! If you have moved or changed any of your contact information, you will need to get this updated before voting! Scan the QR codes below to learn more!

## 2023-11-05 NOTE — Telephone Encounter (Signed)
 Called the patient and reviewed the lab work results. She also states that the CPAP machine is meeting end of life motor and [facility] advised that she can get a new machine in July. Advised I will place the order in and send to [facility] for them to have on file and get set up in July when insurance will allow. Advised the pt of needing to schedule a initial cpap visit for the new machine within 31-90 days to getting it. Pt verbalized understanding. Informed she may have to repeat a updated HST.

## 2023-11-11 DIAGNOSIS — J384 Edema of larynx: Secondary | ICD-10-CM | POA: Diagnosis not present

## 2023-11-11 DIAGNOSIS — R09A2 Foreign body sensation, throat: Secondary | ICD-10-CM | POA: Diagnosis not present

## 2023-11-11 DIAGNOSIS — J383 Other diseases of vocal cords: Secondary | ICD-10-CM | POA: Diagnosis not present

## 2023-11-11 DIAGNOSIS — F458 Other somatoform disorders: Secondary | ICD-10-CM | POA: Diagnosis not present

## 2023-11-11 DIAGNOSIS — J387 Other diseases of larynx: Secondary | ICD-10-CM | POA: Diagnosis not present

## 2023-11-11 DIAGNOSIS — R490 Dysphonia: Secondary | ICD-10-CM | POA: Diagnosis not present

## 2023-11-11 DIAGNOSIS — J385 Laryngeal spasm: Secondary | ICD-10-CM | POA: Diagnosis not present

## 2023-11-13 DIAGNOSIS — J4550 Severe persistent asthma, uncomplicated: Secondary | ICD-10-CM | POA: Diagnosis not present

## 2023-11-13 DIAGNOSIS — D806 Antibody deficiency with near-normal immunoglobulins or with hyperimmunoglobulinemia: Secondary | ICD-10-CM | POA: Diagnosis not present

## 2023-11-20 ENCOUNTER — Ambulatory Visit

## 2023-11-27 ENCOUNTER — Ambulatory Visit

## 2023-12-01 DIAGNOSIS — D806 Antibody deficiency with near-normal immunoglobulins or with hyperimmunoglobulinemia: Secondary | ICD-10-CM | POA: Diagnosis not present

## 2023-12-01 DIAGNOSIS — J4550 Severe persistent asthma, uncomplicated: Secondary | ICD-10-CM | POA: Diagnosis not present

## 2023-12-02 ENCOUNTER — Telehealth: Payer: Self-pay | Admitting: Allergy & Immunology

## 2023-12-02 MED ORDER — PREDNISONE 10 MG PO TABS: ORAL_TABLET | ORAL | 0 refills | Status: DC

## 2023-12-02 MED ORDER — AZITHROMYCIN 250 MG PO TABS: ORAL_TABLET | ORAL | 0 refills | Status: DC

## 2023-12-02 NOTE — Telephone Encounter (Signed)
 [place], wheezing, chest tightness, and congestion.  She has completed a 10-day course of cefdinir  and a short course of low-dose prednisone , but she is still having symptoms.  She is using her Mucinex  and drinking lots of fluids.  She is behind on her immunoglobulin infusion because she is at the beach but she is scheduled to receive it next week.  We have not gotten the Dupixent started, because she had to cancel her start injection appointment.  She has no longer on the azithromycin  prophylaxis.    Because of her continued symptoms, we are going to start her on azithromycin  course and extend her low-dose prednisone  for another 5 to 7 days.

## 2023-12-10 DIAGNOSIS — J4550 Severe persistent asthma, uncomplicated: Secondary | ICD-10-CM | POA: Diagnosis not present

## 2023-12-10 DIAGNOSIS — D806 Antibody deficiency with near-normal immunoglobulins or with hyperimmunoglobulinemia: Secondary | ICD-10-CM | POA: Diagnosis not present

## 2023-12-23 ENCOUNTER — Encounter

## 2023-12-25 DIAGNOSIS — D806 Antibody deficiency with near-normal immunoglobulins or with hyperimmunoglobulinemia: Secondary | ICD-10-CM | POA: Diagnosis not present

## 2023-12-25 DIAGNOSIS — J4550 Severe persistent asthma, uncomplicated: Secondary | ICD-10-CM | POA: Diagnosis not present

## 2023-12-30 ENCOUNTER — Encounter

## 2023-12-31 ENCOUNTER — Encounter: Payer: Self-pay | Admitting: Family Medicine

## 2023-12-31 ENCOUNTER — Ambulatory Visit (INDEPENDENT_AMBULATORY_CARE_PROVIDER_SITE_OTHER): Admitting: Family Medicine

## 2023-12-31 VITALS — BP 126/78 | HR 95 | Temp 97.1°F | Ht 68.0 in | Wt 309.0 lb

## 2023-12-31 DIAGNOSIS — G90A Postural orthostatic tachycardia syndrome (POTS): Secondary | ICD-10-CM

## 2023-12-31 DIAGNOSIS — E559 Vitamin D deficiency, unspecified: Secondary | ICD-10-CM

## 2023-12-31 DIAGNOSIS — R7989 Other specified abnormal findings of blood chemistry: Secondary | ICD-10-CM | POA: Diagnosis not present

## 2023-12-31 DIAGNOSIS — R5383 Other fatigue: Secondary | ICD-10-CM

## 2023-12-31 DIAGNOSIS — I10 Essential (primary) hypertension: Secondary | ICD-10-CM

## 2023-12-31 DIAGNOSIS — T7800XD Anaphylactic reaction due to unspecified food, subsequent encounter: Secondary | ICD-10-CM

## 2023-12-31 DIAGNOSIS — R110 Nausea: Secondary | ICD-10-CM

## 2023-12-31 MED ORDER — EPINEPHRINE 0.3 MG/0.3ML IJ SOAJ: 0.3000 mg | INTRAMUSCULAR | 1 refills | Status: AC | PRN

## 2023-12-31 MED ORDER — ONDANSETRON HCL 4 MG PO TABS
4.0000 mg | ORAL_TABLET | Freq: Three times a day (TID) | ORAL | 0 refills | Status: AC | PRN
Start: 2023-12-31 — End: ?

## 2023-12-31 NOTE — Assessment & Plan Note (Signed)
 Needs refill of Epi-pen.

## 2023-12-31 NOTE — Assessment & Plan Note (Signed)
 I will reassess her thyroid  function as this could relate to her fatigue.

## 2023-12-31 NOTE — Assessment & Plan Note (Signed)
 Continue metoprolol  succinate 100 mg daily, and 1/2 tab of metoprolol  tartrate 50 mg at night PRN.

## 2023-12-31 NOTE — Progress Notes (Signed)
 [facility] [address] [place] [address] Dept: [phone] Dept Fax: [phone]  Office Visit  Subjective:    Patient ID: Nicole Mendoza, female    DOB: 1991/03/11, 33 y.o..   MRN: [id number]  Chief Complaint  Patient presents with   Fatigue    C/o having severe fatigue x 2 days.     History of Present Illness:  Patient is in today complaining of a 2-day history of fatigue. Nicole Mendoza notes she was so tired yesterday morning, she did not have the energy to eat breakfast. She slept off and on most of the day. She has not necessarily noted any other signs of illness. Her allergy  and asthma symptoms have been overall stable. She is not running fever. She was diagnosed earlier this year with POTS. She does note her pulse racing at times. She continues on metoprolol  and uses an abdominal binder to help prevent symptoms. Nicole Mendoza also has fibromyalgia. She admits she ahs nto been as active very recently.  Nicole Mendoza does admit to a number of stressors in her private life (father had a recent fracture, adoption issues related to a foster child, a child with recent autism diagnosis, etc.). She finds that this can feel overwhelming at times.  Past Medical History: Patient Active Problem List   Diagnosis Date Noted   POTS (postural orthostatic tachycardia syndrome) 08/22/2023   Postviral fatigue syndrome 08/13/2023   Acute non-recurrent maxillary sinusitis 06/19/2023   Elevated TSH 06/19/2023   NAFLD (nonalcoholic fatty liver disease) 93/89/7975   Other fatigue 11/25/2022   Prediabetes 10/15/2022   Eating disorder 10/15/2022   BMI 45.0-49.9, adult (HCC) Current BMI 45.2 10/15/2022   Adjustment disorder with anxiety 06/12/2022   Vocal cord dysfunction 06/03/2022   Positive ANA (antinuclear antibody) 07/13/2021   Fibromyalgia 07/13/2021   Dysfunction of left eustachian tube 06/26/2021   Vertigo 06/26/2021   Obstructive sleep  apnea 02/26/2021   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   Recurrent infections 12/20/2020   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 11/22/2020   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   Recurrent sinusitis 11/09/2020   Pre-nodular edema of the vocal folds 11/09/2020   ASCUS of cervix with negative high risk HPV 07/10/2020   Cardiac murmur 09/02/2019   Tachycardia 09/01/2019   Salivary stone 09/01/2019   Anaphylactic shock due to adverse food reaction 01/22/2019   Seasonal and perennial allergic rhinitis 01/22/2019   Morbid obesity (HCC) 10/28/2017   Moderate persistent asthma without complication 07/29/2017   Essential hypertension 04/22/2017   Dysmenorrhea 06/26/2016   Menorrhagia with regular cycle 06/26/2016   Gastroesophageal reflux disease 12/22/2015   Vitamin D  deficiency 11/02/2014   Past Surgical History:  Procedure Laterality Date   NO PAST SURGERIES     Family History  Adopted: Yes  Problem Relation Age of Onset   Asthma Mother    Diabetes Mother    High blood pressure Mother    Heart disease Mother    Bipolar disorder Mother    Sleep apnea Mother    Obesity Mother    Allergic rhinitis Father    Asthma Father    Diabetes Father    High blood pressure Father    Hyperlipidemia Father    Obesity Father    Allergic rhinitis Sister    Asthma Sister    Angioedema Neg Hx  Atopy Neg Hx    Eczema Neg Hx    Immunodeficiency Neg Hx    Urticaria Neg Hx    Outpatient Medications Prior to Visit  Medication Sig Dispense Refill   Ascorbic Acid (SUPER C COMPLEX PO)      budesonide  (PULMICORT ) 0.25 MG/2ML nebulizer solution Take 2 mLs (0.25 mg total) by nebulization 2 (two) times daily as needed. 120 mL 12   cetirizine (ZYRTEC) 10 MG tablet Take 10 mg by mouth daily.     Cholecalciferol  100 MCG (4000 UT) CAPS Take 1 capsule (4,000 Units  total) by mouth daily. 30 capsule 0   dexlansoprazole  (DEXILANT ) 60 MG capsule Take 1 capsule (60 mg total) by mouth daily. 90 capsule 3   DUPIXENT 300 MG/2ML prefilled syringe      famotidine  (PEPCID ) 40 MG tablet Take 1 tablet (40 mg total) by mouth daily. 90 tablet 2   ferrous sulfate 325 (65 FE) MG tablet 3 (three) times a week.      fluticasone  (FLONASE ) 50 MCG/ACT nasal spray Place 2 sprays into both nostrils daily. 16 g 5   hydrOXYzine  (ATARAX ) 25 MG tablet Take 1 tablet (25 mg total) by mouth every 6 (six) hours as needed for anxiety (allergies). 30 tablet 1   ipratropium (ATROVENT ) 0.02 % nebulizer solution Take 1.25 mLs (0.25 mg total) by nebulization 4 (four) times daily. Mix 0.5 vial in with levalbuterol  nebulizer and can use every 6 hours as needed for shortness of breath and wheezing. 75 mL 3   levalbuterol  (XOPENEX  HFA) 45 MCG/ACT inhaler INHALE 2 PUFFS BY MOUTH EVERY 6 HOURS AS NEEDED 15 g 3   levalbuterol  (XOPENEX ) 1.25 MG/3ML nebulizer solution USE 1 VIAL BY NEBULIZATION EVERY 6 (SIX) HOURS AS NEEDED FOR WHEEZING. 540 mL 1   metoprolol  succinate (TOPROL -XL) 100 MG 24 hr tablet TAKE 1 TABLET BY MOUTH EVERY DAY WITH OR IMMEDIATELY FOLLOWING A MEAL 90 tablet 3   metoprolol  tartrate (LOPRESSOR ) 50 MG tablet TAKE 1 TABLET (50 MG TOTAL) BY MOUTH AS NEEDED (FOR PALPITATIONS). 90 tablet 2   mometasone  (ASMANEX , 120 METERED DOSES,) 220 MCG/ACT inhaler Inhale 2 puffs into the lungs 2 (two) times daily. 1 each 5   montelukast  (SINGULAIR ) 10 MG tablet Take 1 tablet (10 mg total) by mouth daily. 90 tablet 1   Prenatal Vit-Fe Fumarate-FA (MULTIVITAMIN-PRENATAL) 27-0.8 MG TABS tablet Take 1 tablet by mouth daily at 12 noon.     Probiotic Product (PHILLIPS COLON HEALTH) CAPS      sucralfate  (CARAFATE ) 1 g tablet Take 1 tablet (1 g total) by mouth 4 (four) times daily -  with meals and at bedtime. 120 tablet 0   EPINEPHrine  0.3 mg/0.3 mL IJ SOAJ injection      ondansetron  (ZOFRAN ) 4 MG tablet Take  1 tablet (4 mg total) by mouth every 8 (eight) hours as needed for nausea or vomiting. 20 tablet 0   Fluticasone  Propionate (XHANCE ) 93 MCG/ACT EXHU Place 2 sprays into the nose 2 (two) times daily as needed. (Patient not taking: Reported on 12/31/2023) 32 mL 11   modafinil  (PROVIGIL ) 200 MG tablet Take 0.5-1 tablets (100-200 mg total) by mouth daily. (Patient not taking: Reported on 12/31/2023) 30 tablet 2   azithromycin  (ZITHROMAX ) 250 MG tablet Take 2 tablets on the first day and 1 tablet daily for 4 more days. 6 each 0   Levonorgestrel-Ethinyl Estradiol  (AMETHIA) 0.1-0.02 & 0.01 MG tablet Take 1 tablet by mouth daily.     predniSONE  (DELTASONE ) 10  MG tablet Take 1 tablet twice daily for 7 days. 14 tablet 0   No facility-administered medications prior to visit.   Allergies  Allergen Reactions   Budesonide -Formoterol  Fumarate Other (See Comments)    Causes Hypertension  Causes Hypertension    Made blood pressure go up   Other     Tree nuts   Penicillins Hives   Raspberry Hives   Spiriva  Respimat [Tiotropium Bromide Monohydrate ] Other (See Comments)    Pt states medication causes dizziness   Albuterol Palpitations   Losartan Rash     Objective:   Today's Vitals   12/31/23 1422  BP: 126/78  Pulse: 95  Temp: (!) 97.1 F (36.2 C)  TempSrc: Temporal  SpO2: 97%  Weight: (!) 309 lb (140.2 kg)  Height: 5' 8 (1.727 m)   Body mass index is 46.98 kg/m.   General: Well developed, well nourished. No acute distress. Lungs: Clear to auscultation bilaterally. No wheezing, rales or rhonchi. CV: RRR without murmurs or rubs. Pulses 2+ bilaterally. Extremities:  No edema noted. Psych: Alert and oriented. Normal mood and affect.  Health Maintenance Due  Topic Date Due   HIV Screening  Never done   Hepatitis C Screening  Never done   Hepatitis B Vaccines (1 of 3 - 19+ 3-dose series) Never done   HPV VACCINES (1 - 3-dose SCDM series) Never done   Pneumococcal Vaccine 49-44 Years old  (2 of 2 - PCV) 05/13/2019     Assessment & Plan:   Problem List Items Addressed This Visit       Cardiovascular and Mediastinum   Essential hypertension   BP is at goal. Continue metoprolol  succinate 100 mg daily.      Relevant Medications   EPINEPHrine  0.3 mg/0.3 mL IJ SOAJ injection   POTS (postural orthostatic tachycardia syndrome)   Continue metoprolol  succinate 100 mg daily, and 1/2 tab of metoprolol  tartrate 50 mg at night PRN.      Relevant Medications   EPINEPHrine  0.3 mg/0.3 mL IJ SOAJ injection     Other   Anaphylactic shock due to adverse food reaction   Needs refill of Epi-pen.      Relevant Medications   EPINEPHrine  0.3 mg/0.3 mL IJ SOAJ injection   Elevated TSH   I will reassess her thyroid  function as this could relate to her fatigue.      Relevant Orders   TSH   Other fatigue - Primary   Etiology is unclear. Likely due to stressors impacting her mood, fibromyalgia, and POTS. I will check labs to screen for other common etiologies.      Relevant Orders   CBC   Basic metabolic panel with GFR   TSH   VITAMIN D  25 Hydroxy (Vit-D Deficiency, Fractures)   Vitamin D  deficiency   I will reassess her Vitamin D  level, as this could relate to her fatigue.      Relevant Orders   VITAMIN D  25 Hydroxy (Vit-D Deficiency, Fractures)   Other Visit Diagnoses       Nausea       Renew Zofran  for PRN use.   Relevant Medications   ondansetron  (ZOFRAN ) 4 MG tablet       Return in about 3 months (around 04/01/2024) for Reassessment.   Garnette CHRISTELLA Simpler, MD

## 2023-12-31 NOTE — Assessment & Plan Note (Signed)
 I will reassess her Vitamin D  level, as this could relate to her fatigue.

## 2023-12-31 NOTE — Assessment & Plan Note (Signed)
 Etiology is unclear. Likely due to stressors impacting her mood, fibromyalgia, and POTS. I will check labs to screen for other common etiologies.

## 2023-12-31 NOTE — Assessment & Plan Note (Signed)
 BP is at goal. Continue metoprolol  succinate 100 mg daily.

## 2024-01-01 ENCOUNTER — Ambulatory Visit: Payer: Self-pay | Admitting: Family Medicine

## 2024-01-01 DIAGNOSIS — J4550 Severe persistent asthma, uncomplicated: Secondary | ICD-10-CM | POA: Diagnosis not present

## 2024-01-01 DIAGNOSIS — D806 Antibody deficiency with near-normal immunoglobulins or with hyperimmunoglobulinemia: Secondary | ICD-10-CM | POA: Diagnosis not present

## 2024-01-01 DIAGNOSIS — E559 Vitamin D deficiency, unspecified: Secondary | ICD-10-CM

## 2024-01-01 LAB — CBC
HCT: 39.4 % (ref 36.0–46.0)
Hemoglobin: 13 g/dL (ref 12.0–15.0)
MCHC: 32.9 g/dL (ref 30.0–36.0)
MCV: 82.3 fl (ref 78.0–100.0)
Platelets: 332 K/uL (ref 150.0–400.0)
RBC: 4.79 Mil/uL (ref 3.87–5.11)
RDW: 14 % (ref 11.5–15.5)
WBC: 9.1 K/uL (ref 4.0–10.5)

## 2024-01-01 LAB — BASIC METABOLIC PANEL WITH GFR
BUN: 14 mg/dL (ref 6–23)
CO2: 26 meq/L (ref 19–32)
Calcium: 9.1 mg/dL (ref 8.4–10.5)
Chloride: 101 meq/L (ref 96–112)
Creatinine, Ser: 0.62 mg/dL (ref 0.40–1.20)
GFR: 117.55 mL/min (ref >60.00)
Glucose, Bld: 87 mg/dL (ref 70–99)
Potassium: 3.8 meq/L (ref 3.5–5.1)
Sodium: 136 meq/L (ref 135–145)

## 2024-01-01 LAB — TSH: TSH: 2.13 u[IU]/mL (ref 0.35–5.50)

## 2024-01-01 LAB — VITAMIN D 25 HYDROXY (VIT D DEFICIENCY, FRACTURES): VITD: 19.6 ng/mL — ABNORMAL LOW (ref 30.00–100.00)

## 2024-01-01 MED ORDER — VITAMIN D (ERGOCALCIFEROL) 1.25 MG (50000 UNIT) PO CAPS: 50000.0000 [IU] | ORAL_CAPSULE | ORAL | 0 refills | Status: DC

## 2024-01-01 NOTE — Addendum Note (Signed)
 Addended by: THEDORA GARNETTE HERO on: 01/01/2024 04:43 PM   Modules accepted: Orders

## 2024-01-13 DIAGNOSIS — J4550 Severe persistent asthma, uncomplicated: Secondary | ICD-10-CM | POA: Diagnosis not present

## 2024-01-13 DIAGNOSIS — D806 Antibody deficiency with near-normal immunoglobulins or with hyperimmunoglobulinemia: Secondary | ICD-10-CM | POA: Diagnosis not present

## 2024-01-14 ENCOUNTER — Encounter: Payer: Self-pay | Admitting: *Deleted

## 2024-01-23 DIAGNOSIS — J4550 Severe persistent asthma, uncomplicated: Secondary | ICD-10-CM | POA: Diagnosis not present

## 2024-01-23 DIAGNOSIS — D806 Antibody deficiency with near-normal immunoglobulins or with hyperimmunoglobulinemia: Secondary | ICD-10-CM | POA: Diagnosis not present

## 2024-01-28 DIAGNOSIS — G4733 Obstructive sleep apnea (adult) (pediatric): Secondary | ICD-10-CM | POA: Diagnosis not present

## 2024-01-30 NOTE — Progress Notes (Signed)
 33 y.o. G0P0000 female here for annual exam. 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  Patient's last menstrual period was 01/20/2024 (exact date). Period Duration (Days): 5 Period Pattern: Regular Menstrual Flow: Heavy Menstrual Control: Maxi pad, Tampon Dysmenorrhea: (!) Severe Dysmenorrhea Symptoms: Headache, Diarrhea  She reports urinary frequency and leakage. Vaginal odor after menses.  Hx of recurrent vaginal yeast and thrush due abx use, less frequent since starting IVIG for antibody deficiency. Took diflucan  last on Saturday Urine sample provided: Yes  Abnormal bleeding: none Pelvic discharge or pain: +severe dysmenorrhea, stopped COC last year due to IVIG risk of DVT Breast mass, nipple discharge or skin changes : none  Sexually active: Yes  Birth control: None Last PAP: unsure of date  Exercising: No Smoker: No  Flowsheet Row Office Visit from 02/02/2024 in [facility]  PHQ-2 Total Score 2    Flowsheet Row Office Visit from 12/26/2021 in [facility] at [facility]  PHQ-9 Total Score 17     GYN HISTORY: No sig hx  OB History  Gravida Para Term Preterm AB Living  0 0 0 0 0 0  SAB IAB Ectopic Multiple Live Births  0 0 0 0 0   Past Medical History:  Diagnosis Date   Allergic rhinitis    Allergy     Angio-edema    Asthma    Dysmenorrhea    Fatty liver    GERD (gastroesophageal reflux disease)    Hypertension    Recurrent upper respiratory infection (URI)    Sleep apnea    Tachycardia    Past Surgical History:  Procedure Laterality Date   NO PAST SURGERIES     Current Outpatient Medications on File Prior to Visit  Medication Sig Dispense Refill   Ascorbic Acid (SUPER C COMPLEX PO)      budesonide  (PULMICORT ) 0.25 MG/2ML nebulizer solution Take 2 mLs (0.25 mg total) by nebulization 2 (two) times daily as needed. 120 mL 12   cetirizine (ZYRTEC) 10 MG tablet Take 10 mg by mouth daily.      Cholecalciferol  100 MCG (4000 UT) CAPS Take 1 capsule (4,000 Units total) by mouth daily. 30 capsule 0   dexlansoprazole  (DEXILANT ) 60 MG capsule Take 1 capsule (60 mg total) by mouth daily. 90 capsule 3   DUPIXENT 300 MG/2ML prefilled syringe      EPINEPHrine  0.3 mg/0.3 mL IJ SOAJ injection Inject 0.3 mg into the muscle as needed for anaphylaxis. 1 each 1   famotidine  (PEPCID ) 40 MG tablet Take 1 tablet (40 mg total) by mouth daily. 90 tablet 2   ferrous sulfate 325 (65 FE) MG tablet 3 (three) times a week.      fluticasone  (FLONASE ) 50 MCG/ACT nasal spray Place 2 sprays into both nostrils daily. 16 g 5   hydrOXYzine  (ATARAX ) 25 MG tablet Take 1 tablet (25 mg total) by mouth every 6 (six) hours as needed for anxiety (allergies). 30 tablet 1   ipratropium (ATROVENT ) 0.02 % nebulizer solution Take 1.25 mLs (0.25 mg total) by nebulization 4 (four) times daily. Mix 0.5 vial in with levalbuterol  nebulizer and can use every 6 hours as needed for shortness of breath and wheezing. 75 mL 3   levalbuterol  (XOPENEX  HFA) 45 MCG/ACT inhaler INHALE 2 PUFFS BY MOUTH EVERY 6 HOURS AS NEEDED 15 g 3   levalbuterol  (XOPENEX ) 1.25 MG/3ML nebulizer solution USE 1 VIAL BY NEBULIZATION EVERY 6 (SIX) HOURS AS NEEDED FOR WHEEZING. 540 mL 1  metoprolol  succinate (TOPROL -XL) 100 MG 24 hr tablet TAKE 1 TABLET BY MOUTH EVERY DAY WITH OR IMMEDIATELY FOLLOWING A MEAL 90 tablet 3   metoprolol  tartrate (LOPRESSOR ) 50 MG tablet TAKE 1 TABLET (50 MG TOTAL) BY MOUTH AS NEEDED (FOR PALPITATIONS). 90 tablet 2   mometasone  (ASMANEX , 120 METERED DOSES,) 220 MCG/ACT inhaler Inhale 2 puffs into the lungs 2 (two) times daily. 1 each 5   montelukast  (SINGULAIR ) 10 MG tablet Take 1 tablet (10 mg total) by mouth daily. 90 tablet 1   ondansetron  (ZOFRAN ) 4 MG tablet Take 1 tablet (4 mg total) by mouth every 8 (eight) hours as needed for nausea or vomiting. 20 tablet 0   Prenatal Vit-Fe Fumarate-FA (MULTIVITAMIN-PRENATAL) 27-0.8 MG TABS tablet  Take 1 tablet by mouth daily at 12 noon.     Probiotic Product (PHILLIPS COLON HEALTH) CAPS      sucralfate  (CARAFATE ) 1 g tablet Take 1 tablet (1 g total) by mouth 4 (four) times daily -  with meals and at bedtime. 120 tablet 0   Vitamin D , Ergocalciferol , (DRISDOL ) 1.25 MG (50000 UNIT) CAPS capsule Take 50,000 Units by mouth once a week.     No current facility-administered medications on file prior to visit.   Social History   Socioeconomic History   Marital status: Married    Spouse name: Prentice   Number of children: 0   Years of education: Not on file   Highest education level: Not on file  Occupational History   Occupation: Hospice RN  Tobacco Use   Smoking status: Never   Smokeless tobacco: Never  Vaping Use   Vaping status: Never Used  Substance and Sexual Activity   Alcohol use: No    Alcohol/week: 0.0 standard drinks of alcohol   Drug use: No   Sexual activity: Yes    Birth control/protection: None  Other Topics Concern   Not on file  Social History Narrative   Not on file   Social Drivers of Health   Financial Resource Strain: Not on file  Food Insecurity: Not on file  Transportation Needs: Not on file  Physical Activity: Not on file  Stress: Not on file  Social Connections: Unknown (10/16/2022)   Received from [facility]   Social Network    Social Network: Not on file  Intimate Partner Violence: Unknown (10/16/2022)   Received from [facility]   HITS    Physically Hurt: Not on file    Insult or Talk Down To: Not on file    Threaten Physical Harm: Not on file    Scream or Curse: Not on file   Family History  Adopted: Yes  Problem Relation Age of Onset   Asthma Mother    Diabetes Mother    High blood pressure Mother    Heart disease Mother    Bipolar disorder Mother    Sleep apnea Mother    Obesity Mother    Allergic rhinitis Father    Asthma Father    Diabetes Father    High blood pressure Father    Hyperlipidemia Father    Obesity Father     Allergic rhinitis Sister    Asthma Sister    Angioedema Neg Hx    Atopy Neg Hx    Eczema Neg Hx    Immunodeficiency Neg Hx    Urticaria Neg Hx    Allergies  Allergen Reactions   Budesonide -Formoterol  Fumarate Other (See Comments)    Causes Hypertension  Causes Hypertension  Made blood pressure go up   Other     Tree nuts   Penicillins Hives   Raspberry Hives   Spiriva Respimat [Tiotropium Bromide Monohydrate] Other (See Comments)    Pt states medication causes dizziness   Albuterol Palpitations   Losartan Rash    PE Today's Vitals   02/02/24 1111  BP: 126/72  Pulse: 97  Temp: 98.1 F (36.7 C)  TempSrc: Oral  SpO2: 98%  Weight: (!) 310 lb (140.6 kg)  Height: 5' 8.25 (1.734 m)   Body mass index is 46.79 kg/m.  Physical Exam Vitals reviewed. Exam conducted with a chaperone present.  Constitutional:      General: She is not in acute distress.    Appearance: Normal appearance.  HENT:     Head: Normocephalic and atraumatic.     Nose: Nose normal.  Eyes:     Extraocular Movements: Extraocular movements intact.     Conjunctiva/sclera: Conjunctivae normal.  Neck:     Thyroid: No thyroid mass, thyromegaly or thyroid tenderness.  Pulmonary:     Effort: Pulmonary effort is normal.  Chest:     Chest wall: No mass or tenderness.  Breasts:    Right: Normal. No swelling, mass, nipple discharge, skin change or tenderness.     Left: Normal. No swelling, mass, nipple discharge, skin change or tenderness.  Abdominal:     General: There is no distension.     Palpations: Abdomen is soft.     Tenderness: There is no abdominal tenderness.  Genitourinary:    General: Normal vulva.     Exam position: Lithotomy position.     Urethra: No prolapse.     Vagina: Normal. No vaginal discharge or bleeding.     Cervix: Normal. No lesion.     Uterus: Normal. Not enlarged and not tender.      Adnexa: Right adnexa normal and left adnexa normal.  Musculoskeletal:         General: Normal range of motion.     Cervical back: Normal range of motion.  Lymphadenopathy:     Upper Body:     Right upper body: No axillary adenopathy.     Left upper body: No axillary adenopathy.     Lower Body: No right inguinal adenopathy. No left inguinal adenopathy.  Skin:    General: Skin is warm and dry.  Neurological:     General: No focal deficit present.     Mental Status: She is alert.  Psychiatric:        Mood and Affect: Mood normal.        Behavior: Behavior normal.      Assessment and Plan:        Well woman exam with routine gynecological exam Assessment & Plan: Cervical cancer screening performed according to ASCCP guidelines.  Labs and immunizations with her primary Encouraged safe sexual practices as indicated Encouraged healthy lifestyle practices with diet and exercise For patients under 50yo, I recommend 1000mg  calcium daily and 600IU of vitamin D daily. Declines hormonal contraceptive 2/2 DVT risk Discussed use of IUD for HVB and dysmenorrhea- declined Wants to continue NSAIDs and tylenol    Urinary frequency -     Urinalysis,Complete w/RFL Culture  Cervical cancer screening -     Cytology - PAP  Screening for depression  Vaginal discharge -     WET PREP FOR TRICH, YEAST, CLUE  Secondary dysmenorrhea -     Ibuprofen; Take 1 tablet (800 mg total) by mouth every 8 (  eight) hours as needed for up to 5 days. Start up to 48 hours before your cycle.  Dispense: 45 tablet; Refill: 3  BV (bacterial vaginosis) -     metroNIDAZOLE; Place 1 Applicatorful vaginally at bedtime for 5 days.  Dispense: 50 g; Refill: 0   Vera LULLA Pa, MD

## 2024-02-02 ENCOUNTER — Ambulatory Visit (INDEPENDENT_AMBULATORY_CARE_PROVIDER_SITE_OTHER): Admitting: Obstetrics and Gynecology

## 2024-02-02 ENCOUNTER — Other Ambulatory Visit (HOSPITAL_COMMUNITY)
Admission: RE | Admit: 2024-02-02 | Discharge: 2024-02-02 | Disposition: A | Discharge status: Home / Self Care | Source: Ambulatory Visit | Attending: Obstetrics and Gynecology | Admitting: Obstetrics and Gynecology

## 2024-02-02 ENCOUNTER — Encounter: Payer: Self-pay | Admitting: Obstetrics and Gynecology

## 2024-02-02 VITALS — BP 126/72 | HR 97 | Temp 98.1°F | Ht 68.25 in | Wt 310.0 lb

## 2024-02-02 DIAGNOSIS — Z01419 Encounter for gynecological examination (general) (routine) without abnormal findings: Secondary | ICD-10-CM | POA: Diagnosis not present

## 2024-02-02 DIAGNOSIS — R350 Frequency of micturition: Secondary | ICD-10-CM

## 2024-02-02 DIAGNOSIS — N760 Acute vaginitis: Secondary | ICD-10-CM | POA: Diagnosis not present

## 2024-02-02 DIAGNOSIS — N898 Other specified noninflammatory disorders of vagina: Secondary | ICD-10-CM

## 2024-02-02 DIAGNOSIS — N945 Secondary dysmenorrhea: Secondary | ICD-10-CM | POA: Diagnosis not present

## 2024-02-02 DIAGNOSIS — Z124 Encounter for screening for malignant neoplasm of cervix: Secondary | ICD-10-CM

## 2024-02-02 DIAGNOSIS — Z1331 Encounter for screening for depression: Secondary | ICD-10-CM

## 2024-02-02 DIAGNOSIS — B9689 Other specified bacterial agents as the cause of diseases classified elsewhere: Secondary | ICD-10-CM

## 2024-02-02 LAB — URINALYSIS, COMPLETE W/RFL CULTURE
Bacteria, UA: NONE SEEN /HPF
Bilirubin Urine: NEGATIVE
Glucose, UA: NEGATIVE
Hgb urine dipstick: NEGATIVE
Hyaline Cast: NONE SEEN /LPF
Ketones, ur: NEGATIVE
Leukocyte Esterase: NEGATIVE
Nitrites, Initial: NEGATIVE
Protein, ur: NEGATIVE
RBC / HPF: NONE SEEN /HPF (ref 0–2)
Specific Gravity, Urine: 1.02 (ref 1.001–1.035)
WBC, UA: NONE SEEN /HPF (ref 0–5)
pH: 6.5 (ref 5.0–8.0)

## 2024-02-02 LAB — NO CULTURE INDICATED

## 2024-02-02 LAB — WET PREP FOR TRICH, YEAST, CLUE

## 2024-02-02 MED ORDER — IBUPROFEN 800 MG PO TABS: 800.0000 mg | ORAL_TABLET | Freq: Three times a day (TID) | ORAL | 3 refills | Status: AC | PRN

## 2024-02-02 MED ORDER — METRONIDAZOLE 0.75 % VA GEL: 1.0000 | Freq: Every day | VAGINAL | 0 refills | Status: AC

## 2024-02-02 NOTE — Assessment & Plan Note (Signed)
 Cervical cancer screening performed according to ASCCP guidelines.  Labs and immunizations with her primary Encouraged safe sexual practices as indicated Encouraged healthy lifestyle practices with diet and exercise For patients under 33yo, I recommend 1000mg  calcium daily and 600IU of vitamin D  daily. Declines hormonal contraceptive 2/2 DVT risk Discussed use of IUD for HVB and dysmenorrhea- declined Wants to continue NSAIDs and tylenol 

## 2024-02-02 NOTE — Patient Instructions (Signed)

## 2024-02-03 ENCOUNTER — Ambulatory Visit: Payer: Self-pay | Admitting: Obstetrics and Gynecology

## 2024-02-05 ENCOUNTER — Other Ambulatory Visit: Payer: Self-pay | Admitting: Internal Medicine

## 2024-02-06 DIAGNOSIS — J4550 Severe persistent asthma, uncomplicated: Secondary | ICD-10-CM | POA: Diagnosis not present

## 2024-02-06 DIAGNOSIS — D806 Antibody deficiency with near-normal immunoglobulins or with hyperimmunoglobulinemia: Secondary | ICD-10-CM | POA: Diagnosis not present

## 2024-02-06 LAB — CYTOLOGY - PAP
Comment: NEGATIVE
Diagnosis: NEGATIVE
Diagnosis: REACTIVE
High risk HPV: NEGATIVE

## 2024-02-09 ENCOUNTER — Ambulatory Visit: Admitting: Family Medicine

## 2024-02-12 DIAGNOSIS — D806 Antibody deficiency with near-normal immunoglobulins or with hyperimmunoglobulinemia: Secondary | ICD-10-CM | POA: Diagnosis not present

## 2024-02-12 DIAGNOSIS — J4550 Severe persistent asthma, uncomplicated: Secondary | ICD-10-CM | POA: Diagnosis not present

## 2024-02-13 DIAGNOSIS — D806 Antibody deficiency with near-normal immunoglobulins or with hyperimmunoglobulinemia: Secondary | ICD-10-CM | POA: Diagnosis not present

## 2024-02-13 DIAGNOSIS — J4550 Severe persistent asthma, uncomplicated: Secondary | ICD-10-CM | POA: Diagnosis not present

## 2024-02-24 ENCOUNTER — Ambulatory Visit

## 2024-02-27 ENCOUNTER — Ambulatory Visit (INDEPENDENT_AMBULATORY_CARE_PROVIDER_SITE_OTHER): Admitting: *Deleted

## 2024-02-27 DIAGNOSIS — J4540 Moderate persistent asthma, uncomplicated: Secondary | ICD-10-CM

## 2024-02-27 MED ORDER — DUPILUMAB 300 MG/2ML ~~LOC~~ SOSY
600.0000 mg | PREFILLED_SYRINGE | Freq: Once | SUBCUTANEOUS | Status: AC
  Administered 2024-02-27: 600 mg via SUBCUTANEOUS

## 2024-02-27 NOTE — Progress Notes (Signed)
 Immunotherapy   Patient Details  Name: Nicole Mendoza MRN: [id number] Date of Birth: 1991-01-26  02/27/2024  Nicole Mendoza started injections for  Dupixent  600 mg loading dose in left and right arm Frequency: Every 14 days Epi-Pen:Epi-Pen Available  Consent signed and patient instructions given. Patient waited 1 hour with no issues.  Patient will proceed with home injections moving forward.    Nicole Mendoza 02/27/2024, 10:46 AM

## 2024-02-27 NOTE — Progress Notes (Signed)
 Vitals at 10:14am b/p 138/82, spos 99-98%, heart rate 94-98, pulse 20, temperature 36.6c, symptom of tongue tingling resolved.

## 2024-02-28 DIAGNOSIS — G4733 Obstructive sleep apnea (adult) (pediatric): Secondary | ICD-10-CM | POA: Diagnosis not present

## 2024-03-02 DIAGNOSIS — D806 Antibody deficiency with near-normal immunoglobulins or with hyperimmunoglobulinemia: Secondary | ICD-10-CM | POA: Diagnosis not present

## 2024-03-02 DIAGNOSIS — J4550 Severe persistent asthma, uncomplicated: Secondary | ICD-10-CM | POA: Diagnosis not present

## 2024-03-04 DIAGNOSIS — D806 Antibody deficiency with near-normal immunoglobulins or with hyperimmunoglobulinemia: Secondary | ICD-10-CM | POA: Diagnosis not present

## 2024-03-04 DIAGNOSIS — J4550 Severe persistent asthma, uncomplicated: Secondary | ICD-10-CM | POA: Diagnosis not present

## 2024-03-16 ENCOUNTER — Encounter: Payer: Self-pay | Admitting: Allergy & Immunology

## 2024-03-16 ENCOUNTER — Telehealth: Payer: Self-pay | Admitting: Allergy & Immunology

## 2024-03-16 ENCOUNTER — Ambulatory Visit (INDEPENDENT_AMBULATORY_CARE_PROVIDER_SITE_OTHER): Admitting: Allergy & Immunology

## 2024-03-16 ENCOUNTER — Other Ambulatory Visit: Payer: Self-pay

## 2024-03-16 VITALS — BP 130/84 | HR 94 | Temp 98.5°F | Resp 16 | Ht 68.0 in | Wt 312.0 lb

## 2024-03-16 DIAGNOSIS — J383 Other diseases of vocal cords: Secondary | ICD-10-CM | POA: Diagnosis not present

## 2024-03-16 DIAGNOSIS — D806 Antibody deficiency with near-normal immunoglobulins or with hyperimmunoglobulinemia: Secondary | ICD-10-CM

## 2024-03-16 DIAGNOSIS — J302 Other seasonal allergic rhinitis: Secondary | ICD-10-CM

## 2024-03-16 DIAGNOSIS — J4540 Moderate persistent asthma, uncomplicated: Secondary | ICD-10-CM | POA: Diagnosis not present

## 2024-03-16 DIAGNOSIS — J3089 Other allergic rhinitis: Secondary | ICD-10-CM

## 2024-03-16 DIAGNOSIS — K219 Gastro-esophageal reflux disease without esophagitis: Secondary | ICD-10-CM

## 2024-03-16 MED ORDER — NYSTATIN 100000 UNIT/ML MT SUSP: OROMUCOSAL | 5 refills | Status: AC

## 2024-03-16 NOTE — Progress Notes (Signed)
 FOLLOW UP  Date of Service/Encounter:  03/16/24   Assessment:   Mild persistent asthma, uncomplicated - well controlled at this point in time   Vocal cord dysfunction - followed by Dr. Delayne at [facility]   Recurrent infections - improved following Pneumovax, but then worsened (streptococcal avidity assay abnormal) - on Privigen  with a decrease in frequency of infections, although we are going to try to increase frequency to every 3 weeks   Seasonal and perennial allergic rhinitis (trees, weeds, grasses and dust mites) - not well controlled    Gastroesophageal reflux disease - on Dexilant  and famotidine  and PRN carafate     Adverse food reactions - resolved with minimizing exposure to food additives and avoiding peanuts/tree nuts   Snoring with perceived poor sleep quality - improved with use of the CPAP   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  - I am so HAPPY with how well you are doing today. - Stop the Singulair  whenever you are ready to try stopping that. - You can always restart if it things get better.  - Daily controller medication(s): Asmanex  2 puffs twice daily  - Prior to physical activity: Xopenex  2 puffs 10-15 minutes before physical activity.  - Rescue medications: Xopenex  4 puffs every 4-6 hours as needed - Changes during respiratory infections or worsening symptoms: Add on Pulmicort  0.25mg  to one treatment twice daily for TWO WEEKS. - Asthma control goals:  * Full participation in all desired activities (may need albuterol before activity) * Albuterol use two time or less a week on average (not counting use with activity) * Cough interfering with sleep two time or less a month * Oral steroids no  more than once a year * No hospitalizations  Allergic rhinitis - Continue saline rinses several times a day - Continue cetirizine 10 mg once a day as needed for a runny nose or itch. - Continue with Xhance  back on board. Medication Samples have been provided to the patient. - Continue with Astelin  two sprays per nostril twice daily in case this is related to postnasal drip.   Recurrent infections - We will continue with the Privagen every three weeks. - We could consider changing to Asceniv since you failed Hizentra  and Privagen.  - It seems that you are under good control with the current regimen as it is.   Reflux - Continue dietary and lifestyle modifications as listed below - Continue with Dexilant  approved again with your insurance. - Continue with famotidine  40mg  nightly. - Continue with Carafate  as needed.   Vocal cord dysfunction - Continue with speech therapy at home lessons as you are doing.  - Continue with the belly breathing exercises.  Food allergy  (tree nuts) - Continue to avoid tree nuts.  In case of an allergic reaction, take Benadryl 50 mg every 4 hours, and if life-threatening symptoms occur, inject with AuviQ 0.3 mg. - We will get that mixed tree nut challenge done at some point.   Return in about 3 months (around 06/15/2024) for AMOXICILLIN CHALLENGE. You can have the follow up appointment with Dr. Iva or a Nurse Practicioner (our Nurse Practitioners are excellent and always have Physician oversight!).     Subjective:   Nicole Mendoza is a 33 y.o. female presenting today for follow  up of  Chief Complaint  Patient presents with   Follow-up    She presents with seasonal allergy  and her asthma is doing fine.     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   Acute non-recurrent maxillary sinusitis 06/19/2023   Elevated TSH 06/19/2023   NAFLD (nonalcoholic fatty liver disease) 93/89/7975   Other fatigue 11/25/2022   Prediabetes 10/15/2022   Eating disorder 10/15/2022   BMI 45.0-49.9, adult (HCC) Current BMI 45.2 10/15/2022   Adjustment disorder with anxiety 06/12/2022   Vocal cord dysfunction 06/03/2022   Positive ANA (antinuclear antibody) 07/13/2021   Fibromyalgia 07/13/2021   Dysfunction of left eustachian tube 06/26/2021   Vertigo 06/26/2021   Obstructive sleep apnea 02/26/2021   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   Recurrent infections 12/20/2020   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 11/22/2020   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/2020   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   Recurrent sinusitis 11/09/2020   Pre-nodular edema of the vocal folds 11/09/2020   ASCUS of cervix with negative high risk HPV 07/10/2020   Cardiac murmur 09/02/2019   Tachycardia 09/01/2019   Salivary stone 09/01/2019   Anaphylactic shock due to adverse food reaction 01/22/2019   Seasonal and perennial allergic rhinitis 01/22/2019   Morbid obesity (HCC) 10/28/2017   Moderate persistent asthma without complication 07/29/2017   Essential hypertension 04/22/2017   Dysmenorrhea 06/26/2016   Menorrhagia with regular cycle 06/26/2016   Gastroesophageal reflux disease 12/22/2015   Vitamin D  deficiency 11/02/2014    History obtained from: chart review and patient.  Discussed the use of AI scribe software for clinical note transcription with the patient and/or guardian, who gave verbal consent to proceed.  Nicole Mendoza is a 33 y.o. female presenting for a follow up visit.  She was last seen in May 2025.  At that time, lung testing looked better.  We decided to proceed Dupixent  and decided to administer the first couple in the office.  We continue with  Asmanex  2 puffs twice daily as well as Singulair .  He has Pulmicort  that she adds during flares.  For her allergic rhinitis, we got Xhance  back on board and continue with cetirizine and nasal saline.  For her recurrent infections, she was doing better with Privigen  every 3 weeks.  Reflux was under good control with Dexilant  as well as famotidine  and Carafate .  For her inducible laryngeal obstruction, would continue with speech therapy.  She continue to avoid tree nuts.  Since last visit, she has done well.  Asthma/Respiratory Symptom History: She continues to use Asmanex , taking two puffs twice a day. Her recent spirometry results showed FVC of 2.58 liters and 2.71 liters, and FEV1 values of 75% and 81%. She feels better on Dupixent , noting a clear difference in her symptoms. The medication's effect diminishes a day or two before the next dose. She receives injections at a friend's house due to discomfort with self-administration in the abdomen. She has completed the loading dose and the second dose of Dupixent .  Allergic Rhinitis Symptom History: She experiences significant eye symptoms, which are not relieved by Pataday drops, and her insurance does not cover the previous drops prescribed. She is having some postnasal drip with throat clearing. She  is largely doing well with the current regimen.  Food Allergy  Symptom History: She continues to avoid tree nuts.  She never did go through with the tree nut challenge.  She passed a peanut  challenge, but is not a big part of their diet at all. She has an up to date EpiPen .   Infection Symptom History: She receives infusions at home, which last about six hours. Inadequate hydration before infusions leads to severe headaches. She no longer requires prednisone  before infusions. She experiences recurrent oral thrush, which is difficult to manage. She dislikes taking Diflucan  due to side effects like palpitations and is considering using a nystatin  rinse to manage  the thrush symptoms.  Her family history includes her mother with breathing issues attributed to being overweight and possible signs of dementia, and her father, who has had multiple surgeries following falls. She lives with her husband and children, works from home, and receives home infusions.   Otherwise, there have been no changes to her past medical history, surgical history, family history, or social history.    Review of systems otherwise negative other than that mentioned in the HPI.    Objective:   Blood pressure 130/84, pulse 94, temperature 98.5 F (36.9 C), temperature source Temporal, resp. rate 16, height 5' 8 (1.727 m), weight (!) 312 lb (141.5 kg), SpO2 100%. Body mass index is 47.44 kg/m.    Physical Exam Vitals reviewed.  Constitutional:      Appearance: She is well-developed. She is obese.     Comments: Very talkative.  Not ill-appearing.  Cooperative with the exam.  Seems fairly upbeat at this point.  HENT:     Head: Normocephalic and atraumatic.     Right Ear: Tympanic membrane, ear canal and external ear normal.     Left Ear: Tympanic membrane, ear canal and external ear normal.     Nose: No nasal deformity, septal deviation, mucosal edema or rhinorrhea.     Right Turbinates: Enlarged, swollen and pale.     Left Turbinates: Enlarged, swollen and pale.     Right Sinus: No maxillary sinus tenderness or frontal sinus tenderness.     Left Sinus: No maxillary sinus tenderness or frontal sinus tenderness.     Comments: No nasal polyps. Scant clear rhinorrhea.     Mouth/Throat:     Lips: Pink.     Mouth: Mucous membranes are moist. Mucous membranes are not pale and not dry.     Pharynx: Uvula midline. No pharyngeal swelling or oropharyngeal exudate.     Tonsils: 2+ on the right. 2+ on the left.     Comments: Cobblestoning present in the posterior oropharynx.  Eyes:     General: Lids are normal. Allergic shiner present.        Right eye: No discharge.         Left eye: No discharge.     Conjunctiva/sclera:     Right eye: Right conjunctiva is not injected. No chemosis.    Left eye: Left conjunctiva is not injected. No chemosis.    Pupils: Pupils are equal, round, and reactive to light.  Cardiovascular:     Rate and Rhythm: Normal rate and regular rhythm.     Heart sounds: Normal heart sounds.  Pulmonary:     Effort: Pulmonary effort is normal. No tachypnea, accessory muscle usage or respiratory distress.     Breath sounds: Normal breath sounds. No wheezing, rhonchi or rales.     Comments: Moving air well in all lung  fields.  No increased work of breathing. Chest:     Chest wall: No tenderness.  Lymphadenopathy:     Cervical: No cervical adenopathy.  Skin:    General: Skin is warm.     Capillary Refill: Capillary refill takes less than 2 seconds.     Coloration: Skin is not pale.     Findings: No abrasion, erythema, petechiae or rash. Rash is not papular, urticarial or vesicular.     Comments: No eczematous or urticarial lesions noted.  Neurological:     Mental Status: She is alert.  Psychiatric:        Behavior: Behavior is cooperative.      Diagnostic studies:    Spirometry:        Allergy  Studies: none       Marty Shaggy, MD  [facility]  

## 2024-03-16 NOTE — Telephone Encounter (Signed)
 Patient has an Amoxicillin challenge scheduled with Dr.Gallagher at 8:30am on 01/06.   Please mail out challenge protocol to patient.   Best Contact: [phone]

## 2024-03-16 NOTE — Patient Instructions (Addendum)
 Asthma - Lung testing looks MUCH better today.  - I am so HAPPY with how well you are doing today. - Stop the Singulair  whenever you are ready to try stopping that. - You can always restart if it things get better.  - Daily controller medication(s): Asmanex  2 puffs twice daily  - Prior to physical activity: Xopenex  2 puffs 10-15 minutes before physical activity.  - Rescue medications: Xopenex  4 puffs every 4-6 hours as needed - Changes during respiratory infections or worsening symptoms: Add on Pulmicort  0.25mg  to one treatment twice daily for TWO WEEKS. - Asthma control goals:  * Full participation in all desired activities (may need albuterol before activity) * Albuterol use two time or less a week on average (not counting use with activity) * Cough interfering with sleep two time or less a month * Oral steroids no more than once a year * No hospitalizations  Allergic rhinitis - Continue saline rinses several times a day - Continue cetirizine 10 mg once a day as needed for a runny nose or itch. - Continue with Xhance  back on board. Medication Samples have been provided to the patient. - Continue with Astelin  two sprays per nostril twice daily in case this is related to postnasal drip.   Recurrent infections - We will continue with the Privagen every three weeks. - We could consider changing to Asceniv since you failed Hizentra  and Privagen.  - It seems that you are under good control with the current regimen as it is.   Reflux - Continue dietary and lifestyle modifications as listed below - Continue with Dexilant  approved again with your insurance. - Continue with famotidine  40mg  nightly. - Continue with Carafate  as needed.   Vocal cord dysfunction - Continue with speech therapy at home lessons as you are doing.  - Continue with the belly breathing exercises.  Food allergy  (tree nuts) - Continue to avoid tree nuts.  In case of an allergic reaction, take Benadryl 50 mg every 4  hours, and if life-threatening symptoms occur, inject with AuviQ 0.3 mg. - We will get that mixed tree nut challenge done at some point.   Return in about 3 months (around 06/15/2024) for AMOXICILLIN CHALLENGE. You can have the follow up appointment with Dr. Iva or a Nurse Practicioner (our Nurse Practitioners are excellent and always have Physician oversight!).    Please inform us  of any Emergency Department visits, hospitalizations, or changes in symptoms. Call us  before going to the ED for breathing or allergy  symptoms since we might be able to fit you in for a sick visit. Feel free to contact us  anytime with any questions, problems, or concerns.  It was a pleasure to see you again today!  Websites that have reliable patient information: 1. American Academy of Asthma, Allergy , and Immunology: www.aaaai.org 2. Food Allergy  Research and Education (FARE): foodallergy.org 3. Mothers of Asthmatics: http://www.asthmacommunitynetwork.org 4. American College of Allergy , Asthma, and Immunology: www.acaai.org      "Like" us  on Facebook and Instagram for our latest updates!      A healthy democracy works best when [employer] participate! Make sure you are registered to vote! If you have moved or changed any of your contact information, you will need to get this updated before voting! Scan the QR codes below to learn more!

## 2024-03-18 NOTE — Telephone Encounter (Signed)
 I have mailed out protocol to patient. I also wrote on the letter that provider will send in medication order prior to visit.

## 2024-03-23 DIAGNOSIS — J4550 Severe persistent asthma, uncomplicated: Secondary | ICD-10-CM | POA: Diagnosis not present

## 2024-03-23 DIAGNOSIS — D806 Antibody deficiency with near-normal immunoglobulins or with hyperimmunoglobulinemia: Secondary | ICD-10-CM | POA: Diagnosis not present

## 2024-03-30 ENCOUNTER — Other Ambulatory Visit: Payer: Self-pay | Admitting: Internal Medicine

## 2024-04-03 ENCOUNTER — Other Ambulatory Visit: Payer: Self-pay | Admitting: Family Medicine

## 2024-04-03 DIAGNOSIS — E559 Vitamin D deficiency, unspecified: Secondary | ICD-10-CM

## 2024-04-05 ENCOUNTER — Telehealth: Payer: Self-pay | Admitting: Neurology

## 2024-04-05 DIAGNOSIS — G4719 Other hypersomnia: Secondary | ICD-10-CM

## 2024-04-05 DIAGNOSIS — G4733 Obstructive sleep apnea (adult) (pediatric): Secondary | ICD-10-CM

## 2024-04-05 DIAGNOSIS — R000 Tachycardia, unspecified: Secondary | ICD-10-CM

## 2024-04-05 NOTE — Telephone Encounter (Signed)
 Spoke to patient states just received her new cpap machine last week Pt states fatigue and falling asleep at work and drowsy  Pt states her last pap machine didn't have those issues .Pt is requesting a pressure increase if needed

## 2024-04-05 NOTE — Telephone Encounter (Signed)
 Pt is calling out of concern re: pressure settings on CPAP.  Pt states she is unable to stay awake during the day, this is concerning, please call pt.

## 2024-04-06 ENCOUNTER — Ambulatory Visit: Admitting: Family Medicine

## 2024-04-06 NOTE — Addendum Note (Signed)
 Addended by: CHALICE SAUNAS on: 04/06/2024 05:21 PM   Modules accepted: Orders

## 2024-04-06 NOTE — Telephone Encounter (Signed)
 I sent an order to increase the maximum pressure of auto CPAP to 14 cm water.   Dedra Gores ,MD

## 2024-04-07 NOTE — Telephone Encounter (Signed)
 Sent orders to [facility] this am

## 2024-04-12 DIAGNOSIS — D806 Antibody deficiency with near-normal immunoglobulins or with hyperimmunoglobulinemia: Secondary | ICD-10-CM | POA: Diagnosis not present

## 2024-04-12 DIAGNOSIS — J4550 Severe persistent asthma, uncomplicated: Secondary | ICD-10-CM | POA: Diagnosis not present

## 2024-04-14 DIAGNOSIS — J4550 Severe persistent asthma, uncomplicated: Secondary | ICD-10-CM | POA: Diagnosis not present

## 2024-04-14 DIAGNOSIS — D806 Antibody deficiency with near-normal immunoglobulins or with hyperimmunoglobulinemia: Secondary | ICD-10-CM | POA: Diagnosis not present

## 2024-04-19 ENCOUNTER — Encounter: Payer: Self-pay | Admitting: Family Medicine

## 2024-04-28 ENCOUNTER — Encounter: Payer: Self-pay | Admitting: Family Medicine

## 2024-04-28 ENCOUNTER — Ambulatory Visit (INDEPENDENT_AMBULATORY_CARE_PROVIDER_SITE_OTHER): Admitting: Family Medicine

## 2024-04-28 VITALS — BP 126/74 | HR 96 | Temp 98.2°F | Ht 68.0 in | Wt 313.2 lb

## 2024-04-28 DIAGNOSIS — M797 Fibromyalgia: Secondary | ICD-10-CM

## 2024-04-28 DIAGNOSIS — D806 Antibody deficiency with near-normal immunoglobulins or with hyperimmunoglobulinemia: Secondary | ICD-10-CM

## 2024-04-28 DIAGNOSIS — J3089 Other allergic rhinitis: Secondary | ICD-10-CM | POA: Diagnosis not present

## 2024-04-28 DIAGNOSIS — G90A Postural orthostatic tachycardia syndrome (POTS): Secondary | ICD-10-CM

## 2024-04-28 DIAGNOSIS — J4540 Moderate persistent asthma, uncomplicated: Secondary | ICD-10-CM | POA: Diagnosis not present

## 2024-04-28 DIAGNOSIS — I10 Essential (primary) hypertension: Secondary | ICD-10-CM

## 2024-04-28 DIAGNOSIS — J302 Other seasonal allergic rhinitis: Secondary | ICD-10-CM

## 2024-04-28 MED ORDER — METOPROLOL TARTRATE 50 MG PO TABS
25.0000 mg | ORAL_TABLET | ORAL | 2 refills | Status: AC | PRN
Start: 2024-04-28 — End: ?

## 2024-04-28 NOTE — Assessment & Plan Note (Signed)
 Continue working with Dr. Lydia Sams.

## 2024-04-28 NOTE — Assessment & Plan Note (Signed)
 This may be a factor in her exercise tolerance. However, I pointed out that regular exercise is the best approach to improving her day-to-day functioning.

## 2024-04-28 NOTE — Progress Notes (Signed)
 [facility] [address] [place] [address] Dept: [phone] Dept Fax: [phone]  Chronic Care Office Visit  Subjective:    Patient ID: Nicole Mendoza, female    DOB: 09-17-1990, 33 y.o..   MRN: [id number]  Chief Complaint  Patient presents with   Hypertension    3 month f/u HTN.     History of Present Illness:  Patient is in today for reassessment of chronic medical conditions.   Ms. Segers has a history of hypertension. She is on metoprolol  succinate 100 mg daily, but this was primarily prescribed for dealing with tachycardia issues that she developed in her early 24s. She is taking 1/2 tab of metoprolol  tartrate 50 mg at night when needed for the tachycardia. It is felt this is likely due to POTS disease.    Ms. Man has moderate persistent asthma and sees Dr. Iva (allergist) related to this. She is treated with a combination of therapies for her allergies and asthma, including Xopenox (inhaler and nebs), Asmanex , Atrovent , and Singulair . She also occasionally uses a budesonide  neb. For her allergies, she also uses cetirizine, and a fluticasone  nasal spray (Xhance ). She has PRN hydroxyzine  for itching and occasional anxiety. She  was recently started on dupilumab  (Dupixent ). She feels her symptoms are much improved, noting she seemed to get through the Fall allergy  season without a flare.   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 She is now engaged with Dr. Tobie (immunology) at [facility]. She is receiving IVIG every 3 months.  Past Medical History: Patient Active Problem List   Diagnosis Date Noted   Well woman exam with routine gynecological exam 02/02/2024   POTS (postural orthostatic tachycardia syndrome) 08/22/2023   Postviral fatigue syndrome 08/13/2023   Acute non-recurrent maxillary sinusitis 06/19/2023   Elevated TSH 06/19/2023   NAFLD (nonalcoholic fatty liver disease) 93/89/7975   Other fatigue  11/25/2022   Prediabetes 10/15/2022   Eating disorder 10/15/2022   BMI 45.0-49.9, adult (HCC) Current BMI 45.2 10/15/2022   Adjustment disorder with anxiety 06/12/2022   Vocal cord dysfunction 06/03/2022   Positive ANA (antinuclear antibody) 07/13/2021   Fibromyalgia 07/13/2021   Dysfunction of left eustachian tube 06/26/2021   Vertigo 06/26/2021   Obstructive sleep apnea 02/26/2021   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   Recurrent infections 12/20/2020   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 11/22/2020   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/2020   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   Recurrent sinusitis 11/09/2020   Pre-nodular edema of the vocal folds 11/09/2020   ASCUS of cervix with negative high risk HPV 07/10/2020   Cardiac murmur 09/02/2019   Tachycardia 09/01/2019   Salivary stone 09/01/2019   Anaphylactic shock due to adverse food reaction 01/22/2019   Seasonal and perennial allergic rhinitis 01/22/2019   Morbid obesity (HCC) 10/28/2017   Moderate persistent asthma without complication 07/29/2017   Essential hypertension 04/22/2017   Dysmenorrhea 06/26/2016   Menorrhagia with regular cycle 06/26/2016   Gastroesophageal reflux disease 12/22/2015   Vitamin D  deficiency 11/02/2014   Past Surgical History:  Procedure Laterality Date   NO PAST SURGERIES     Family History  Adopted: Yes  Problem Relation Age of Onset   Asthma Mother    Diabetes Mother    High blood pressure Mother    Heart disease Mother    Bipolar disorder Mother    Sleep apnea Mother  Obesity Mother    Allergic rhinitis Father    Asthma Father    Diabetes Father    High blood pressure Father    Hyperlipidemia Father    Obesity Father    Allergic rhinitis Sister    Asthma Sister    Angioedema Neg Hx    Atopy Neg Hx    Eczema Neg Hx    Immunodeficiency Neg Hx    Urticaria Neg Hx    Outpatient  Medications Prior to Visit  Medication Sig Dispense Refill   Ascorbic Acid (SUPER C COMPLEX PO)      ASMANEX , 120 METERED DOSES, 220 MCG/ACT inhaler INHALE 2 PUFFS INTO THE LUNGS TWICE A DAY 1 each 5   budesonide  (PULMICORT ) 0.25 MG/2ML nebulizer solution Take 2 mLs (0.25 mg total) by nebulization 2 (two) times daily as needed. 120 mL 12   cetirizine (ZYRTEC) 10 MG tablet Take 10 mg by mouth daily.     Cholecalciferol  100 MCG (4000 UT) CAPS Take 1 capsule (4,000 Units total) by mouth daily. 30 capsule 0   dexlansoprazole  (DEXILANT ) 60 MG capsule Take 1 capsule (60 mg total) by mouth daily. 90 capsule 3   DUPIXENT  300 MG/2ML prefilled syringe      EPINEPHrine  0.3 mg/0.3 mL IJ SOAJ injection Inject 0.3 mg into the muscle as needed for anaphylaxis. 1 each 1   famotidine  (PEPCID ) 40 MG tablet Take 1 tablet (40 mg total) by mouth daily. (Patient taking differently: Take 40 mg by mouth as needed.) 90 tablet 2   ferrous sulfate 325 (65 FE) MG tablet 3 (three) times a week.      fluticasone  (FLONASE ) 50 MCG/ACT nasal spray Place 2 sprays into both nostrils daily. (Patient not taking: Reported on 03/16/2024) 16 g 5   hydrOXYzine  (ATARAX ) 25 MG tablet Take 1 tablet (25 mg total) by mouth every 6 (six) hours as needed for anxiety (allergies). 30 tablet 1   ipratropium (ATROVENT ) 0.02 % nebulizer solution Take 1.25 mLs (0.25 mg total) by nebulization 4 (four) times daily. Mix 0.5 vial in with levalbuterol  nebulizer and can use every 6 hours as needed for shortness of breath and wheezing. 75 mL 3   levalbuterol  (XOPENEX  HFA) 45 MCG/ACT inhaler INHALE 2 PUFFS BY MOUTH EVERY 6 HOURS AS NEEDED 15 g 3   levalbuterol  (XOPENEX ) 1.25 MG/3ML nebulizer solution USE 1 VIAL BY NEBULIZATION EVERY 6 (SIX) HOURS AS NEEDED FOR WHEEZING. 540 mL 1   metoprolol  succinate (TOPROL -XL) 100 MG 24 hr tablet TAKE 1 TABLET BY MOUTH EVERY DAY WITH OR IMMEDIATELY FOLLOWING A MEAL 90 tablet 3   metoprolol  tartrate (LOPRESSOR ) 50 MG tablet  TAKE 1 TABLET (50 MG TOTAL) BY MOUTH AS NEEDED (FOR PALPITATIONS). 90 tablet 2   montelukast  (SINGULAIR ) 10 MG tablet TAKE 1 TABLET BY MOUTH EVERY DAY 90 tablet 1   nystatin  (MYCOSTATIN ) 100000 UNIT/ML suspension Rinse and spit once daily to prevent thrush. 473 mL 5   ondansetron  (ZOFRAN ) 4 MG tablet Take 1 tablet (4 mg total) by mouth every 8 (eight) hours as needed for nausea or vomiting. 20 tablet 0   Prenatal Vit-Fe Fumarate-FA (MULTIVITAMIN-PRENATAL) 27-0.8 MG TABS tablet Take 1 tablet by mouth daily at 12 noon.     Probiotic Product (PHILLIPS COLON HEALTH) CAPS      sucralfate  (CARAFATE ) 1 g tablet Take 1 tablet (1 g total) by mouth 4 (four) times daily -  with meals and at bedtime. 120 tablet 0   Vitamin D , Ergocalciferol , (DRISDOL ) 1.25  MG (50000 UNIT) CAPS capsule Take 50,000 Units by mouth once a week.     No facility-administered medications prior to visit.   Allergies  Allergen Reactions   Budesonide -Formoterol  Fumarate Other (See Comments)    Causes Hypertension  Causes Hypertension    Made blood pressure go up   Other     Tree nuts   Penicillins Hives   Raspberry Hives   Spiriva  Respimat [Tiotropium Bromide] Other (See Comments)    Pt states medication causes dizziness   Albuterol Palpitations   Losartan Rash     Objective:   Today's Vitals   04/28/24 1333  BP: 126/74  Pulse: 96  Temp: 98.2 F (36.8 C)  TempSrc: Temporal  SpO2: 99%  Weight: (!) 313 lb 3.2 oz (142.1 kg)  Height: 5' 8 (1.727 m)   Body mass index is 47.62 kg/m.   General: Well developed, well nourished. No acute distress. Psych: Alert and oriented. Normal mood and affect.  Health Maintenance Due  Topic Date Due   HIV Screening  Never done   Hepatitis C Screening  Never done   Hepatitis B Vaccines 19-59 Average Risk (1 of 3 - 19+ 3-dose series) Never done   HPV VACCINES (1 - 3-dose SCDM series) Never done   Pneumococcal Vaccine (2 of 2 - PCV) 05/13/2019   Influenza Vaccine  01/16/2024      Assessment & Plan:   Problem List Items Addressed This Visit       Cardiovascular and Mediastinum   Essential hypertension - Primary   BP is at goal. Continue metoprolol  succinate 100 mg daily.      Relevant Medications   metoprolol  tartrate (LOPRESSOR ) 50 MG tablet   POTS (postural orthostatic tachycardia syndrome)   Continue metoprolol  succinate 100 mg daily, and 1/2 tab of metoprolol  tartrate 50 mg at night PRN.      Relevant Medications   metoprolol  tartrate (LOPRESSOR ) 50 MG tablet     Respiratory   Moderate persistent asthma without complication   Well-controlled. Continue dupilumab  (Dupixent ), montelukast  10 mg daily, fluticasone  propionate (Xhance ) daily, and budesonide  nebs, ipratropium nebs and levalbuterol  PRN. Recommend Ms. Kees discuss with Dr. Iva about whether she should have a PCV-20 and/or flu vaccine. Also recommend she inquire about pulmonary rehab as an approach to her safely resuming an exercise regimen.      Seasonal and perennial allergic rhinitis   Stable. Continue dupilumab  (Dupixent ), cetirizine 10 mg daily, montelukast  10 mg daily and a fluticasone  nasal spray (Xhance ).        Other   Fibromyalgia   This may be a factor in her exercise tolerance. However, I pointed out that regular exercise is the best approach to improving her day-to-day functioning.      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 Tobie.       Return in about 3 months (around 07/29/2024) for Reassessment.   Garnette CHRISTELLA Simpler, MD  I,Emily Lagle,acting as a scribe for Garnette CHRISTELLA Simpler, MD.,have documented all relevant documentation on the behalf of Garnette CHRISTELLA Simpler, MD.  I, Garnette CHRISTELLA Simpler, MD, have reviewed all documentation for this visit. The documentation on 04/28/2024 for the exam, diagnosis, procedures, and orders are all accurate and complete.

## 2024-04-28 NOTE — Assessment & Plan Note (Addendum)
 Well-controlled. Continue dupilumab  (Dupixent ), montelukast  10 mg daily, fluticasone  propionate (Xhance ) daily, and budesonide  nebs, ipratropium nebs and levalbuterol  PRN. Recommend Nicole Mendoza discuss with Dr. Iva about whether she should have a PCV-20 and/or flu vaccine. Also recommend she inquire about pulmonary rehab as an approach to her safely resuming an exercise regimen.

## 2024-04-28 NOTE — Assessment & Plan Note (Addendum)
 Stable. Continue dupilumab  (Dupixent ), cetirizine 10 mg daily, montelukast  10 mg daily and a fluticasone  nasal spray (Xhance ).

## 2024-04-28 NOTE — Assessment & Plan Note (Signed)
 Continue metoprolol  succinate 100 mg daily, and 1/2 tab of metoprolol  tartrate 50 mg at night PRN.

## 2024-04-28 NOTE — Assessment & Plan Note (Signed)
 BP is at goal. Continue metoprolol  succinate 100 mg daily.

## 2024-05-03 DIAGNOSIS — J4550 Severe persistent asthma, uncomplicated: Secondary | ICD-10-CM | POA: Diagnosis not present

## 2024-05-03 DIAGNOSIS — D806 Antibody deficiency with near-normal immunoglobulins or with hyperimmunoglobulinemia: Secondary | ICD-10-CM | POA: Diagnosis not present

## 2024-05-06 DIAGNOSIS — J4550 Severe persistent asthma, uncomplicated: Secondary | ICD-10-CM | POA: Diagnosis not present

## 2024-05-06 DIAGNOSIS — D806 Antibody deficiency with near-normal immunoglobulins or with hyperimmunoglobulinemia: Secondary | ICD-10-CM | POA: Diagnosis not present

## 2024-05-11 ENCOUNTER — Telehealth: Payer: Self-pay | Admitting: Allergy & Immunology

## 2024-05-11 DIAGNOSIS — H00011 Hordeolum externum right upper eyelid: Secondary | ICD-10-CM | POA: Diagnosis not present

## 2024-05-11 NOTE — Telephone Encounter (Signed)
 Pt states she's on vacation and she needs her dexilant  and also does she needs a prior auth and she states if she can't get dexilant  can she get something else.. and [facility] to [facility] on [address] [place] [address]

## 2024-05-12 ENCOUNTER — Telehealth: Payer: Self-pay | Admitting: Allergy & Immunology

## 2024-05-12 ENCOUNTER — Other Ambulatory Visit: Payer: Self-pay

## 2024-05-12 MED ORDER — DEXLANSOPRAZOLE 60 MG PO CPDR: 60.0000 mg | DELAYED_RELEASE_CAPSULE | Freq: Every day | ORAL | 3 refills | Status: AC

## 2024-05-12 MED ORDER — PANTOPRAZOLE SODIUM 40 MG PO TBEC: 40.0000 mg | DELAYED_RELEASE_TABLET | Freq: Every day | ORAL | 2 refills | Status: AC

## 2024-05-12 NOTE — Telephone Encounter (Signed)
 Called patient and [place] Protonix  40 [place] [address] [place], [place], [place] [address].

## 2024-05-12 NOTE — Telephone Encounter (Signed)
 I sent it in!   Nicole Bonds, MD [facility]

## 2024-05-12 NOTE — Telephone Encounter (Signed)
 Hi Ms. Nicole Mendoza,   May you do Dexilant  60 mg Prior Authorization, please?  Thank you

## 2024-05-12 NOTE — Telephone Encounter (Signed)
 Patient called this morning and she said insurance is not covered for Dexilant  and she has to pay over $200. She asked if Dr. Iva can prescribe for her another one because she has been off since 3 days. She said she was taking Pantoprazole  before. Thank you

## 2024-05-16 NOTE — Telephone Encounter (Signed)
 Sent in Protonix  for the patient.

## 2024-05-17 ENCOUNTER — Other Ambulatory Visit (HOSPITAL_COMMUNITY): Payer: Self-pay

## 2024-05-17 ENCOUNTER — Telehealth: Payer: Self-pay

## 2024-05-17 NOTE — Progress Notes (Unsigned)
 SABRA

## 2024-05-17 NOTE — Patient Instructions (Incomplete)
 Please continue using your CPAP regularly. While your insurance requires that you use CPAP at least 4 hours each night on 70% of the nights, I recommend, that you not skip any nights and use it throughout the night if you can. Getting used to CPAP and staying with the treatment long term does take time and patience and discipline. Untreated obstructive sleep apnea when it is moderate to severe can have an adverse impact on cardiovascular health and raise her risk for heart disease, arrhythmias, hypertension, congestive heart failure, stroke and diabetes. Untreated obstructive sleep apnea causes sleep disruption, nonrestorative sleep, and sleep deprivation. This can have an impact on your day to day functioning and cause daytime sleepiness and impairment of cognitive function, memory loss, mood disturbance, and problems focussing. Using CPAP regularly can improve these symptoms.  We will update supply orders, when needed.   Follow up with Dr Chalice as previously scheduled 10/2024.

## 2024-05-17 NOTE — Telephone Encounter (Signed)
*  AA  Pharmacy Patient Advocate Encounter  Per test claim, medication is not covered due to plan/benefit exclusion, PA not submitted at this time  [facility] and may be a courtesy fill price.

## 2024-05-17 NOTE — Progress Notes (Unsigned)
 PATIENT: Nicole Mendoza DOB: 1991-04-28  REASON FOR VISIT: follow up HISTORY FROM: patient  Virtual Visit via Telephone Note  I connected with [name] on 05/17/24 at  9:45 AM EST by telephone and verified that I am speaking with the correct person using two identifiers.   I discussed the limitations, risks, security and privacy concerns of performing an evaluation and management service by telephone and the availability of in person appointments. I also discussed with the patient that there may be a patient responsible charge related to this service. The patient expressed understanding and agreed to proceed.   History of Present Illness:  05/18/2024 ALL (Mychart): Nicole Mendoza returns for follow up for OSA on APAP. She was last seen by Dr Chalice 10/16/2023 and eligible for a new machine. HST??? She received new APAP 03/30/2024. She called shortly following reporting continued daytime sleepiness. Max pressure increased to 14cmH20. Since,   02/11/2023 ALL (Mychart): Nicole Mendoza returns for follow up for OSA on CPAP. She reports doing well on CPAP. She is using therapy every night for about 6-7 hours, on average. She notes feeling terribly if not using therapy. She continues to have chronic fatigue and daytime sleepiness post Covid. She does not wake feeling refreshed. She feels that she could fall asleep almost anytime during the day. She was diagnosed with fibromyalgia by rheumatology. She was started on Privigen  infusions (antibodies) for management of chronic fatigue syndrome by immunology. She continues to see healthy weight and wellness. She reported trouble with focusing. Stimulants avoided due to history of autonomic dysfunction and tachycardia. Considering Wellbutrin . She is focusing on healthy lifestyle habits. She recently resigned from her second job to allow more time for rest.     02/05/2022 ALL (Mychart): Nicole Mendoza returns for follow up for OSA on CPAP. She continues to do very well. She is  tolerating pressure settings. She is using CPAP nightly for about 7 hours. She denies concerns, today.     01/30/21 ALL (Mychart): [name] returns for follow up for OSA on CPAP and post Covid anosmia and ageusia. She continues CPAP nightly and feels she is doing well. 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 She reports doing well on CPAP. She is using her machine every night. She feels that she is doing better post Covid in 2020 with recurrent viral symptoms in 09/2020. She did not start carbamazepine . She does have difficulty waking in the mornings. Her husband reports trying to wake her up for over an hour, recently. She is able to maintain responsibilities and lifestyle.     01/12/2020 CD:  Nicole Mendoza is a 33 y.o. year old White or Caucasian female patient seen here upon PCP  referral on 01/12/2020 from Bascom Borer, NP .   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 Buck the same month she contracted Covid.  She became very short of breath and since she had an underlying condition of asthma struggled for over 10 weeks with significant respiratory difficulties, she remains extremely fatigued with myalgia, limited exercise tolerance and she was also evaluated for POTS.  Autoimmune disorders work-up was negative by rheumatology cardiology.  She reports ongoing smelling smoke and the character of her olfactory triggers a set of foot burning food or meat.  She also becomes very nauseated when she smells ciggarette smoke , also there is no source of the burning smells.  I have the pleasure of seeing [name], RN  today, a right-handed White or Caucasian female with a loss of taste and smell after COVID 19, she has a  has a past medical history of Allergic rhinitis, Angio-edema, Asthma, Fatty liver, GERD (gastroesophageal reflux disease), Hypertension, Recurrent  upper respiratory infection (URI), and Tachycardia.    Dr. Geralene tested the patient for sleep apnea the results were positive and she has been using CPAP over 11 months now.  She is compliant.  10/07/2019 ALL: Nicole Mendoza is a 33 y.o. female here today for follow up of OSA on CPAP.  She reports that she is doing very well with CPAP therapy.  She is using her machine every night.  She does continue close follow-up with primary care post Covid.  She continues to struggle with intermittent fatigue and pain.  She is now working with physical therapy to help with strengthening and endurance.   Compliance report dated 09/06/2019 through 10/05/2019 reveals that she is used CPAP every night for compliance of 100%.  She has used CPAP greater than 4 hours every night.  Average usage is 7 hours and 30 minutes.  Residual AHI 0.4 on 5 to 10 cm of water and an EPR of 1.  There was no significant leak noted.  Observations/Objective:  Generalized: Well developed, in no acute distress  Mentation: Alert oriented to time, place, history taking. Follows all commands speech and language fluent   Assessment and Plan:  33 y.o. year old female  has a past medical history of Allergic rhinitis, Allergy , Angio-edema, Asthma, Dysmenorrhea, Fatty liver, GERD (gastroesophageal reflux disease), Hypertension, Recurrent upper respiratory infection (URI), Sleep apnea, and Tachycardia. here with    ICD-10-CM   1. OSA on CPAP  G47.33     2. Excessive daytime sleepiness  G47.19        Nicole Mendoza is doing well on CPAP therapy. Compliance report shows excellent compliance. She was encouraged to continue therapy nightly for at least 4 hours. Unfortunately, she continues to have significant fatigue and daytime sleepiness post Covid infection in 2020. ESS 14/24. FSS not completed due to nature of video visit. She is followed by multiple specialists and participating in treatment plans as directed. She is concerned about possible  underlying narcolepsy. Due to autonomic dysfunction, she is concerned about taking stimulant medications. She requests to see Dr Chalice in follow up to discuss need for further workup and testing as appropriate. I have encouraged her to resume immunotherapy as planned. I will have her see Dr Chalice about 4 months after she resumes immunotherapy infusions. She will focus on healthy lifestyle habits. She will follow up with pending visit with Dr Chalice 07/2023.    No orders of the defined types were placed in this encounter.   No orders of the defined types were placed in this encounter.    Follow Up Instructions:  I discussed the assessment and treatment plan with the patient. The patient was provided an opportunity to ask questions and all were answered. The patient agreed with the plan and demonstrated an understanding of the instructions.   The patient was advised to call back or seek an in-person evaluation if the symptoms worsen or if the condition fails to improve as anticipated.  I provided 30 minutes of non-face-to-face time during this encounter. Patient located at their place of residence during Mychart visit. Provider is in the office.    Malakhi Markwood, NP

## 2024-05-18 ENCOUNTER — Encounter: Payer: Self-pay | Admitting: Family Medicine

## 2024-05-18 ENCOUNTER — Telehealth: Admitting: Family Medicine

## 2024-05-18 ENCOUNTER — Other Ambulatory Visit (HOSPITAL_COMMUNITY): Payer: Self-pay

## 2024-05-18 ENCOUNTER — Telehealth: Payer: Self-pay

## 2024-05-18 DIAGNOSIS — G4719 Other hypersomnia: Secondary | ICD-10-CM | POA: Diagnosis not present

## 2024-05-18 DIAGNOSIS — G4733 Obstructive sleep apnea (adult) (pediatric): Secondary | ICD-10-CM | POA: Diagnosis not present

## 2024-05-18 NOTE — Telephone Encounter (Signed)
*  AA  Pharmacy Patient Advocate Encounter   Received notification from Fax that prior authorization for Levalbuterol  Tartrate 45MCG/ACT aerosol   is required/requested.   Insurance verification completed.   The patient is insured through [facility].   Per test claim: PA required; PA submitted to above mentioned insurance via Latent Key/confirmation #/EOC [id number] Status is pending

## 2024-05-18 NOTE — Telephone Encounter (Signed)
 I sent him Protonix  instead.

## 2024-05-19 NOTE — Telephone Encounter (Signed)
 Your request has been approved Approved. Authorization Expiration12/07/2024

## 2024-05-19 NOTE — Telephone Encounter (Signed)
 Called Pt and she verbalized understand. She will discuss further more in the next visit with Dr. Iva.

## 2024-05-20 ENCOUNTER — Ambulatory Visit: Admitting: *Deleted

## 2024-05-20 DIAGNOSIS — Z23 Encounter for immunization: Secondary | ICD-10-CM

## 2024-05-21 DIAGNOSIS — D806 Antibody deficiency with near-normal immunoglobulins or with hyperimmunoglobulinemia: Secondary | ICD-10-CM | POA: Diagnosis not present

## 2024-05-21 DIAGNOSIS — J4550 Severe persistent asthma, uncomplicated: Secondary | ICD-10-CM | POA: Diagnosis not present

## 2024-05-27 DIAGNOSIS — J4550 Severe persistent asthma, uncomplicated: Secondary | ICD-10-CM | POA: Diagnosis not present

## 2024-05-27 DIAGNOSIS — D806 Antibody deficiency with near-normal immunoglobulins or with hyperimmunoglobulinemia: Secondary | ICD-10-CM | POA: Diagnosis not present

## 2024-06-01 ENCOUNTER — Encounter (HOSPITAL_BASED_OUTPATIENT_CLINIC_OR_DEPARTMENT_OTHER): Payer: Self-pay

## 2024-06-04 DIAGNOSIS — J029 Acute pharyngitis, unspecified: Secondary | ICD-10-CM | POA: Diagnosis not present

## 2024-06-04 DIAGNOSIS — R0981 Nasal congestion: Secondary | ICD-10-CM | POA: Diagnosis not present

## 2024-06-07 ENCOUNTER — Telehealth: Payer: Self-pay | Admitting: Allergy & Immunology

## 2024-06-07 MED ORDER — CEFDINIR 300 MG PO CAPS: 300.0000 mg | ORAL_CAPSULE | Freq: Two times a day (BID) | ORAL | 0 refills | Status: AC

## 2024-06-07 NOTE — Telephone Encounter (Signed)
 Patient contacted me.  She went to urgent care earlier this week with symptoms of pneumonia and a sinus infection.  She was negative for everything tested in urgent care.  She was given a prescription for doxycycline  as well as prednisone  to start if things got worse.  She contacted me this morning to say that she is not really getting any better.  She started prednisone  on Saturday afternoon when it started to affect her lungs more.  She reports tingling and tightness in her lungs.  She has not been wheezing thankfully.  She has been using Mucinex  and her Xopenex  inhaler.  She started 10 mg twice a day of the prednisone  because it tends to set her POTS off.  She continues to take COVID test at home and was have been negative.  I did reassure her that if she had that many negative COVID test, it was likely not to be COVID.  She was doing the doxycycline  and the prednisone  again and this was not helping.  She was open to starting a course of another antibiotic to broaden her coverage as well.  She is not using her nebulizer because it tends to worsen her POTS.  I recommended that she use the Xopenex  nebulizer solution only and avoid the Atrovent  since that was more likely to irritate her POTS.  Cefdinir  sent into the pharmacy. Confirmed pharmacy with the patient.

## 2024-06-14 DIAGNOSIS — Z880 Allergy status to penicillin: Secondary | ICD-10-CM | POA: Diagnosis not present

## 2024-06-14 DIAGNOSIS — J4550 Severe persistent asthma, uncomplicated: Secondary | ICD-10-CM | POA: Diagnosis not present

## 2024-06-14 DIAGNOSIS — D806 Antibody deficiency with near-normal immunoglobulins or with hyperimmunoglobulinemia: Secondary | ICD-10-CM | POA: Diagnosis not present

## 2024-06-14 DIAGNOSIS — J4541 Moderate persistent asthma with (acute) exacerbation: Secondary | ICD-10-CM | POA: Diagnosis not present

## 2024-06-22 ENCOUNTER — Other Ambulatory Visit: Payer: Self-pay

## 2024-06-22 ENCOUNTER — Encounter: Admitting: Allergy & Immunology

## 2024-06-22 ENCOUNTER — Encounter: Payer: Self-pay | Admitting: Family

## 2024-06-22 ENCOUNTER — Ambulatory Visit: Admitting: Family

## 2024-06-22 VITALS — BP 120/100 | HR 92 | Temp 98.8°F

## 2024-06-22 DIAGNOSIS — J4540 Moderate persistent asthma, uncomplicated: Secondary | ICD-10-CM

## 2024-06-22 DIAGNOSIS — J069 Acute upper respiratory infection, unspecified: Secondary | ICD-10-CM

## 2024-06-22 MED ORDER — CEFDINIR 300 MG PO CAPS: 300.0000 mg | ORAL_CAPSULE | Freq: Two times a day (BID) | ORAL | 0 refills | Status: AC

## 2024-06-22 NOTE — Patient Instructions (Addendum)
 Asthma - Daily controller medication(s): Asmanex  2 puffs twice daily  - Prior to physical activity: Xopenex  2 puffs 10-15 minutes before physical activity.  - Rescue medications: Xopenex  4 puffs every 4-6 hours as needed - Changes during respiratory infections or worsening symptoms: Add on Pulmicort  0.25mg  to one treatment twice daily for TWO WEEKS. - Asthma control goals:  * Full participation in all desired activities (may need albuterol before activity) * Albuterol use two time or less a week on average (not counting use with activity) * Cough interfering with sleep two time or less a month * Oral steroids no more than once a year * No hospitalizations  Allergic rhinitis - Continue saline rinses several times a day - Continue cetirizine 10 mg once a day as needed for a runny nose or itch. - Continue with Xhance  back on board. Medication Samples have been provided to the patient. - Continue with Astelin  two sprays per nostril twice daily in case this is related to postnasal drip.   Recurrent infections - We will continue with the Privagen every three weeks. - We could consider changing to Asceniv since you failed Hizentra  and Privagen.  - It seems that you are under good control with the current regimen as it is.  - A prescription for cefdinir  has been sent. Take 1 tablet twice a day for 10 days. You will use the left over cefdinir  sent in prevoiusly  Reflux - Continue dietary and lifestyle modifications as listed below - Continue with Protonix  40 mg once a day - Continue with famotidine  40mg  nightly. - Continue with Carafate  as needed.  - will refer to GI due to poorly controlled reflux  Vocal cord dysfunction - Continue with speech therapy at home lessons as you are doing.  - Continue with the belly breathing exercises.  Food allergy  (tree nuts) - Continue to avoid tree nuts.  In case of an allergic reaction, take Benadryl 50 mg every 4 hours, and if life-threatening symptoms  occur, inject with AuviQ 0.3 mg. - We will get that mixed tree nut challenge done at some point.   Schedule an appointment when well for penicillin skin testing and possible oral challenge Follow up in 2 months or sooner if needed   Please inform us  of any Emergency Department visits, hospitalizations, or changes in symptoms. Call us  before going to the ED for breathing or allergy  symptoms since we might be able to fit you in for a sick visit. Feel free to contact us  anytime with any questions, problems, or concerns.  It was a pleasure to see you again today!  Websites that have reliable patient information: 1. American Academy of Asthma, Allergy , and Immunology: www.aaaai.org 2. Food Allergy  Research and Education (FARE): foodallergy.org 3. Mothers of Asthmatics: http://www.asthmacommunitynetwork.org 4. American College of Allergy , Asthma, and Immunology: www.acaai.org

## 2024-06-22 NOTE — Progress Notes (Signed)
 "  [address] [place] [address] Dept: [phone]  FOLLOW UP NOTE  Patient ID: Terea Neubauer, female    DOB: [birth year]  Age: 34 y.o. MRN: [id number] Date of Office Visit: 06/22/2024  Assessment  Chief Complaint: Follow-up (No concerns)  HPI Alahni Varone is a 34 year old female who presents today for an acute visit.  She was last seen on March 16, 2024 by Dr. Iva moderate persistent asthma without complication, vocal cord dysfunction followed by Dr. Delayne at [facility].  Recurrent infections on Privigen , seasonal and perennial allergic rhinitis, gastroesophageal reflux disease, adverse food reactions, snoring with perceived poor sleep quality improved with use of CPAP.  Possible POTS syndrome versus chronic fatigue syndrome.  She reports on Thursday or Friday before Christmas her nose started burning and she lost the ability to taste and smell.  Her rapid test for COVID-19 was negative and she feels like it did not pick up.  Her husband was sick also.  She was given doxycycline  by urgent care, but it did not help.  Dr. Iva then prescribed cefdinir , but she reports that she only took 5-1/2 days out of the 10 days.  She reports for the past 3 days she has had sinus pressure, congestion, and last night her right tonsil felt a stabbing pain.  She has a productive cough with thick green sputum and she feels short of breath.  She has a hard time differentiating between her asthma, vocal cord dysfunction, and POTS.  She denies wheezing or tightness in her chest.  She also denies fever, but reports that she always has chills.  Her rhinorrhea is green in color that started today.  She also reports postnasal drip.  She does not know of any other sick contacts other than her foster baby developed a fever today and has had their first ear infection.  She does continue to do sinus rinses twice a day and is using Xhance  nasal spray.  Asthma she does continue to take Asmanex  2  puffs twice a day and has Xopenex  to use as needed.  She also mentions that she has the medications to make DuoNebs should she need it.  She has not tried adding on Pulmicort  0.25 mg twice a day for increased symptoms because she reports that Pulmicort  makes her feel funny.  Since her last office visit she has not required any systemic steroids or made any trips to the emergency room or urgent care due to breathing problems.  Recurrent infections: She continues to receive Privigen  infusions every 3 weeks.  She feels like it and Dupixent  have made a massive difference in her lungs.  Her last Privigen  infusion was on June 18, 2024.  Prior to this time which she had not been sick for 6 months.  She mentions that Dr. Tobie, her immunologist would like for her to do penicillin testing, but she does not think that she could come off antihistamines 7 days prior to this appointment.  She would like to do penicillin skin testing and possible oral challenge with our office due to her anxiety.  Drug Allergies:  Allergies[1]  Review of Systems: Negative except as per HPI  Physical Exam: BP (!) 120/100   Pulse 92   Temp 98.8 F (37.1 C)   SpO2 98%    Physical Exam Constitutional:      Appearance: Normal appearance.  HENT:     Head: Normocephalic and atraumatic.     Comments: Pharynx normal. Eyes normal. Ears  normal. Nose: bilateral lower turbinates mildly edematous with no drainage noted.    Right Ear: Tympanic membrane, ear canal and external ear normal.     Left Ear: Tympanic membrane, ear canal and external ear normal.     Mouth/Throat:     Mouth: Mucous membranes are moist.     Pharynx: Oropharynx is clear.  Eyes:     Conjunctiva/sclera: Conjunctivae normal.  Cardiovascular:     Rate and Rhythm: Regular rhythm.     Heart sounds: Normal heart sounds.  Pulmonary:     Effort: Pulmonary effort is normal.     Breath sounds: Normal breath sounds.     Comments: Lungs clear to  auscultation Musculoskeletal:     Cervical back: Neck supple.  Skin:    General: Skin is warm.  Neurological:     Mental Status: She is alert and oriented to person, place, and time.  Psychiatric:        Mood and Affect: Mood normal.        Behavior: Behavior normal.        Thought Content: Thought content normal.        Judgment: Judgment normal.     Diagnostics:  FVC 3.41 L ( 78%), FEV1 2.77 (76%), FEV1/FVC 0.81. Spirometry indicates possible restrictive defect. Spirometry consistent with previous spirometry     Assessment and Plan: 1. Acute upper respiratory infection   2. Moderate persistent asthma without complication     Meds ordered this encounter  Medications   cefdinir  (OMNICEF ) 300 MG capsule    Sig: Take 1 capsule (300 mg total) by mouth 2 (two) times daily.    Dispense:  12 capsule    Refill:  0    Patient Instructions  Asthma - Daily controller medication(s): Asmanex  2 puffs twice daily  - Prior to physical activity: Xopenex  2 puffs 10-15 minutes before physical activity.  - Rescue medications: Xopenex  4 puffs every 4-6 hours as needed - Changes during respiratory infections or worsening symptoms: Add on Pulmicort  0.25mg  to one treatment twice daily for TWO WEEKS. - Asthma control goals:  * Full participation in all desired activities (may need albuterol before activity) * Albuterol use two time or less a week on average (not counting use with activity) * Cough interfering with sleep two time or less a month * Oral steroids no more than once a year * No hospitalizations  Allergic rhinitis - Continue saline rinses several times a day - Continue cetirizine 10 mg once a day as needed for a runny nose or itch. - Continue with Xhance  back on board. Medication Samples have been provided to the patient. - Continue with Astelin  two sprays per nostril twice daily in case this is related to postnasal drip.   Recurrent infections - We will continue with the  Privagen every three weeks. - We could consider changing to Asceniv since you failed Hizentra  and Privagen.  - It seems that you are under good control with the current regimen as it is.  - A prescription for cefdinir  has been sent. Take 1 tablet twice a day for 10 days. You will use the left over cefdinir  sent in prevoiusly  Reflux - Continue dietary and lifestyle modifications as listed below - Continue with Protonix  40 mg once a day - Continue with famotidine  40mg  nightly. - Continue with Carafate  as needed.  - will refer to GI due to poorly controlled reflux  Vocal cord dysfunction - Continue with speech therapy at home lessons as  you are doing.  - Continue with the belly breathing exercises.  Food allergy  (tree nuts) - Continue to avoid tree nuts.  In case of an allergic reaction, take Benadryl 50 mg every 4 hours, and if life-threatening symptoms occur, inject with AuviQ 0.3 mg. - We will get that mixed tree nut challenge done at some point.   Schedule an appointment when well for penicillin skin testing and possible oral challenge Follow up in 2 months or sooner if needed   Please inform us  of any Emergency Department visits, hospitalizations, or changes in symptoms. Call us  before going to the ED for breathing or allergy  symptoms since we might be able to fit you in for a sick visit. Feel free to contact us  anytime with any questions, problems, or concerns.  It was a pleasure to see you again today!  Websites that have reliable patient information: 1. American Academy of Asthma, Allergy , and Immunology: www.aaaai.org 2. Food Allergy  Research and Education (FARE): foodallergy.org 3. Mothers of Asthmatics: http://www.asthmacommunitynetwork.org 4. American College of Allergy , Asthma, and Immunology: www.acaai.org       Return in about 2 months (around 08/20/2024).    Thank you for the opportunity to care for this patient.  Please do not hesitate to contact me with  questions.  Wanda Craze, FNP [facility]         [1]  Allergies Allergen Reactions   Budesonide -Formoterol  Fumarate Other (See Comments)    Causes Hypertension  Causes Hypertension    Made blood pressure go up   Other     Tree nuts   Penicillins Hives   Raspberry Hives   Spiriva  Respimat [Tiotropium Bromide] Other (See Comments)    Pt states medication causes dizziness   Albuterol Palpitations   Losartan Rash   "

## 2024-07-02 ENCOUNTER — Telehealth: Payer: Self-pay | Admitting: *Deleted

## 2024-07-02 ENCOUNTER — Other Ambulatory Visit (HOSPITAL_COMMUNITY): Payer: Self-pay

## 2024-07-02 MED ORDER — DUPIXENT 300 MG/2ML ~~LOC~~ SOSY: 300.0000 mg | PREFILLED_SYRINGE | SUBCUTANEOUS | 11 refills | Status: AC

## 2024-07-02 NOTE — Telephone Encounter (Signed)
 Patient called with issue with filling her Dupixent  at [facility]. I have reached out to Ins and pharmacy and will need to use [facility]. She is going to pick up a sample in [place] today and I will send Rx to [facility]. She is going to check on her Privigen  to see if she can still fill at [facility] and if not will need to send to another pharmacy

## 2024-07-29 ENCOUNTER — Ambulatory Visit: Admitting: Family Medicine

## 2024-08-24 ENCOUNTER — Ambulatory Visit: Admitting: Family

## 2024-08-24 ENCOUNTER — Ambulatory Visit: Admitting: Allergy & Immunology

## 2024-10-21 ENCOUNTER — Ambulatory Visit: Admitting: Family Medicine

## 2024-10-25 ENCOUNTER — Ambulatory Visit: Admitting: Neurology

## 2025-02-02 ENCOUNTER — Ambulatory Visit: Admitting: Obstetrics and Gynecology
